# Patient Record
Sex: Female | Born: 1935 | Race: White | Hispanic: No | Smoking: Never smoker
Health system: Southern US, Community
[De-identification: ages and names within clinical notes are randomized; demographics above are authoritative.]

## PROBLEM LIST (undated history)

## (undated) DIAGNOSIS — E785 Hyperlipidemia, unspecified: Secondary | ICD-10-CM

## (undated) DIAGNOSIS — R011 Cardiac murmur, unspecified: Secondary | ICD-10-CM

## (undated) DIAGNOSIS — M2550 Pain in unspecified joint: Secondary | ICD-10-CM

## (undated) DIAGNOSIS — K432 Incisional hernia without obstruction or gangrene: Secondary | ICD-10-CM

## (undated) DIAGNOSIS — K5792 Diverticulitis of intestine, part unspecified, without perforation or abscess without bleeding: Secondary | ICD-10-CM

## (undated) DIAGNOSIS — F0390 Unspecified dementia without behavioral disturbance: Secondary | ICD-10-CM

## (undated) DIAGNOSIS — Z8719 Personal history of other diseases of the digestive system: Secondary | ICD-10-CM

## (undated) DIAGNOSIS — H9190 Unspecified hearing loss, unspecified ear: Secondary | ICD-10-CM

## (undated) DIAGNOSIS — E876 Hypokalemia: Secondary | ICD-10-CM

## (undated) DIAGNOSIS — I10 Essential (primary) hypertension: Secondary | ICD-10-CM

## (undated) DIAGNOSIS — M542 Cervicalgia: Secondary | ICD-10-CM

## (undated) DIAGNOSIS — Z87442 Personal history of urinary calculi: Secondary | ICD-10-CM

## (undated) DIAGNOSIS — IMO0001 Reserved for inherently not codable concepts without codable children: Secondary | ICD-10-CM

## (undated) DIAGNOSIS — R531 Weakness: Secondary | ICD-10-CM

## (undated) DIAGNOSIS — Z8619 Personal history of other infectious and parasitic diseases: Secondary | ICD-10-CM

## (undated) DIAGNOSIS — R112 Nausea with vomiting, unspecified: Secondary | ICD-10-CM

## (undated) DIAGNOSIS — K219 Gastro-esophageal reflux disease without esophagitis: Secondary | ICD-10-CM

## (undated) DIAGNOSIS — R51 Headache: Secondary | ICD-10-CM

## (undated) DIAGNOSIS — Z9889 Other specified postprocedural states: Secondary | ICD-10-CM

## (undated) DIAGNOSIS — E039 Hypothyroidism, unspecified: Secondary | ICD-10-CM

## (undated) DIAGNOSIS — M2540 Effusion, unspecified joint: Secondary | ICD-10-CM

## (undated) DIAGNOSIS — R480 Dyslexia and alexia: Secondary | ICD-10-CM

## (undated) DIAGNOSIS — M1990 Unspecified osteoarthritis, unspecified site: Secondary | ICD-10-CM

## (undated) DIAGNOSIS — R55 Syncope and collapse: Secondary | ICD-10-CM

## (undated) DIAGNOSIS — C50919 Malignant neoplasm of unspecified site of unspecified female breast: Secondary | ICD-10-CM

## (undated) HISTORY — DX: Syncope and collapse: R55

## (undated) HISTORY — PX: SHOULDER SURGERY: SHX246

## (undated) HISTORY — PX: OTHER SURGICAL HISTORY: SHX169

## (undated) HISTORY — PX: THYROIDECTOMY: SHX17

## (undated) HISTORY — DX: Personal history of other diseases of the digestive system: Z87.19

## (undated) HISTORY — PX: BREAST LUMPECTOMY: SHX2

## (undated) HISTORY — DX: Malignant neoplasm of unspecified site of unspecified female breast: C50.919

## (undated) HISTORY — PX: TONSILLECTOMY: SUR1361

## (undated) HISTORY — DX: Unspecified osteoarthritis, unspecified site: M19.90

## (undated) HISTORY — DX: Hypokalemia: E87.6

## (undated) HISTORY — PX: TUBAL LIGATION: SHX77

## (undated) HISTORY — PX: KNEE SURGERY: SHX244

## (undated) HISTORY — PX: CARDIAC CATHETERIZATION: SHX172

## (undated) HISTORY — DX: Diverticulitis of intestine, part unspecified, without perforation or abscess without bleeding: K57.92

## (undated) HISTORY — DX: Essential (primary) hypertension: I10

## (undated) HISTORY — PX: CATARACT EXTRACTION: SUR2

## (undated) HISTORY — PX: APPENDECTOMY: SHX54

## (undated) HISTORY — PX: MANDIBLE RECONSTRUCTION: SHX431

---

## 1990-09-04 HISTORY — PX: KIDNEY STONE SURGERY: SHX686

## 1995-09-05 HISTORY — PX: LAPAROSCOPIC NISSEN FUNDOPLICATION: SHX1932

## 1995-09-05 HISTORY — PX: CHOLECYSTECTOMY: SHX55

## 1998-09-04 HISTORY — PX: NECK SURGERY: SHX720

## 1999-07-30 ENCOUNTER — Inpatient Hospital Stay (HOSPITAL_COMMUNITY): Admission: EM | Admit: 1999-07-30 | Discharge: 1999-07-31 | Payer: Self-pay | Admitting: Cardiology

## 2000-07-02 ENCOUNTER — Other Ambulatory Visit: Admission: RE | Admit: 2000-07-02 | Discharge: 2000-07-02 | Payer: Self-pay | Admitting: Obstetrics and Gynecology

## 2001-03-05 ENCOUNTER — Ambulatory Visit (HOSPITAL_COMMUNITY): Admission: RE | Admit: 2001-03-05 | Discharge: 2001-03-05 | Payer: Self-pay | Admitting: Ophthalmology

## 2001-04-20 ENCOUNTER — Emergency Department (HOSPITAL_COMMUNITY): Admission: EM | Admit: 2001-04-20 | Discharge: 2001-04-20 | Payer: Self-pay | Admitting: Emergency Medicine

## 2001-06-07 ENCOUNTER — Emergency Department (HOSPITAL_COMMUNITY): Admission: EM | Admit: 2001-06-07 | Discharge: 2001-06-07 | Payer: Self-pay | Admitting: Emergency Medicine

## 2001-09-04 DIAGNOSIS — C50919 Malignant neoplasm of unspecified site of unspecified female breast: Secondary | ICD-10-CM

## 2001-09-04 HISTORY — DX: Malignant neoplasm of unspecified site of unspecified female breast: C50.919

## 2001-12-09 ENCOUNTER — Encounter: Payer: Self-pay | Admitting: Emergency Medicine

## 2001-12-09 ENCOUNTER — Inpatient Hospital Stay (HOSPITAL_COMMUNITY): Admission: EM | Admit: 2001-12-09 | Discharge: 2001-12-12 | Payer: Self-pay | Admitting: Cardiology

## 2002-02-26 ENCOUNTER — Ambulatory Visit (HOSPITAL_COMMUNITY): Admission: RE | Admit: 2002-02-26 | Discharge: 2002-02-26 | Payer: Self-pay | Admitting: General Surgery

## 2002-03-12 ENCOUNTER — Ambulatory Visit (HOSPITAL_COMMUNITY): Admission: RE | Admit: 2002-03-12 | Discharge: 2002-03-12 | Payer: Self-pay | Admitting: General Surgery

## 2002-05-12 ENCOUNTER — Encounter: Admission: RE | Admit: 2002-05-12 | Discharge: 2002-05-12 | Payer: Self-pay | Admitting: Oncology

## 2002-05-12 ENCOUNTER — Encounter (HOSPITAL_COMMUNITY): Admission: RE | Admit: 2002-05-12 | Discharge: 2002-06-11 | Payer: Self-pay | Admitting: Oncology

## 2002-05-19 ENCOUNTER — Encounter (HOSPITAL_COMMUNITY): Payer: Self-pay | Admitting: Oncology

## 2002-05-19 ENCOUNTER — Ambulatory Visit (HOSPITAL_COMMUNITY): Admission: RE | Admit: 2002-05-19 | Discharge: 2002-05-19 | Payer: Self-pay | Admitting: Oncology

## 2002-05-29 ENCOUNTER — Ambulatory Visit: Admission: RE | Admit: 2002-05-29 | Discharge: 2002-07-22 | Payer: Self-pay | Admitting: Radiation Oncology

## 2002-08-11 ENCOUNTER — Encounter (HOSPITAL_COMMUNITY): Admission: RE | Admit: 2002-08-11 | Discharge: 2002-09-10 | Payer: Self-pay | Admitting: Oncology

## 2002-08-11 ENCOUNTER — Encounter: Admission: RE | Admit: 2002-08-11 | Discharge: 2002-08-11 | Payer: Self-pay | Admitting: Oncology

## 2002-12-15 ENCOUNTER — Emergency Department (HOSPITAL_COMMUNITY): Admission: EM | Admit: 2002-12-15 | Discharge: 2002-12-15 | Payer: Self-pay | Admitting: Emergency Medicine

## 2002-12-15 ENCOUNTER — Encounter: Payer: Self-pay | Admitting: Emergency Medicine

## 2003-02-06 ENCOUNTER — Encounter: Admission: RE | Admit: 2003-02-06 | Discharge: 2003-02-06 | Payer: Self-pay | Admitting: Oncology

## 2003-02-06 ENCOUNTER — Encounter (HOSPITAL_COMMUNITY): Admission: RE | Admit: 2003-02-06 | Discharge: 2003-03-08 | Payer: Self-pay | Admitting: Oncology

## 2003-03-16 ENCOUNTER — Ambulatory Visit (HOSPITAL_COMMUNITY): Admission: RE | Admit: 2003-03-16 | Discharge: 2003-03-16 | Payer: Self-pay | Admitting: Internal Medicine

## 2003-03-26 ENCOUNTER — Emergency Department (HOSPITAL_COMMUNITY): Admission: EM | Admit: 2003-03-26 | Discharge: 2003-03-26 | Payer: Self-pay | Admitting: Emergency Medicine

## 2003-03-26 ENCOUNTER — Encounter: Payer: Self-pay | Admitting: Emergency Medicine

## 2003-04-06 ENCOUNTER — Ambulatory Visit (HOSPITAL_COMMUNITY): Admission: RE | Admit: 2003-04-06 | Discharge: 2003-04-06 | Payer: Self-pay | Admitting: Internal Medicine

## 2003-04-06 ENCOUNTER — Encounter (INDEPENDENT_AMBULATORY_CARE_PROVIDER_SITE_OTHER): Payer: Self-pay | Admitting: Internal Medicine

## 2003-04-13 ENCOUNTER — Encounter: Admission: RE | Admit: 2003-04-13 | Discharge: 2003-04-13 | Payer: Self-pay | Admitting: Oncology

## 2003-04-13 ENCOUNTER — Encounter (HOSPITAL_COMMUNITY): Admission: RE | Admit: 2003-04-13 | Discharge: 2003-05-13 | Payer: Self-pay | Admitting: Oncology

## 2003-04-13 ENCOUNTER — Encounter (HOSPITAL_COMMUNITY): Payer: Self-pay | Admitting: Oncology

## 2003-06-01 ENCOUNTER — Ambulatory Visit (HOSPITAL_COMMUNITY): Admission: RE | Admit: 2003-06-01 | Discharge: 2003-06-01 | Payer: Self-pay | Admitting: Pulmonary Disease

## 2003-08-10 ENCOUNTER — Encounter (HOSPITAL_COMMUNITY): Admission: RE | Admit: 2003-08-10 | Discharge: 2003-09-09 | Payer: Self-pay | Admitting: Oncology

## 2003-08-10 ENCOUNTER — Encounter: Admission: RE | Admit: 2003-08-10 | Discharge: 2003-08-10 | Payer: Self-pay | Admitting: Oncology

## 2004-02-15 ENCOUNTER — Encounter (HOSPITAL_COMMUNITY): Admission: RE | Admit: 2004-02-15 | Discharge: 2004-03-16 | Payer: Self-pay | Admitting: Oncology

## 2004-02-15 ENCOUNTER — Encounter: Admission: RE | Admit: 2004-02-15 | Discharge: 2004-02-15 | Payer: Self-pay | Admitting: Oncology

## 2004-05-04 ENCOUNTER — Encounter (HOSPITAL_COMMUNITY): Admission: RE | Admit: 2004-05-04 | Discharge: 2004-06-03 | Payer: Self-pay | Admitting: Oncology

## 2004-05-04 ENCOUNTER — Encounter: Admission: RE | Admit: 2004-05-04 | Discharge: 2004-06-03 | Payer: Self-pay | Admitting: Oncology

## 2004-06-08 ENCOUNTER — Encounter: Admission: RE | Admit: 2004-06-08 | Discharge: 2004-06-08 | Payer: Self-pay | Admitting: Oncology

## 2004-07-05 ENCOUNTER — Other Ambulatory Visit: Admission: RE | Admit: 2004-07-05 | Discharge: 2004-07-05 | Payer: Self-pay | Admitting: General Surgery

## 2004-08-04 ENCOUNTER — Ambulatory Visit (HOSPITAL_COMMUNITY): Admission: RE | Admit: 2004-08-04 | Discharge: 2004-08-04 | Payer: Self-pay | Admitting: Oncology

## 2004-08-12 ENCOUNTER — Encounter: Admission: RE | Admit: 2004-08-12 | Discharge: 2004-09-02 | Payer: Self-pay | Admitting: Oncology

## 2004-08-12 ENCOUNTER — Encounter (HOSPITAL_COMMUNITY): Admission: RE | Admit: 2004-08-12 | Discharge: 2004-09-02 | Payer: Self-pay | Admitting: Oncology

## 2004-08-12 ENCOUNTER — Ambulatory Visit (HOSPITAL_COMMUNITY): Payer: Self-pay | Admitting: Oncology

## 2004-08-20 ENCOUNTER — Emergency Department (HOSPITAL_COMMUNITY): Admission: EM | Admit: 2004-08-20 | Discharge: 2004-08-20 | Payer: Self-pay | Admitting: Emergency Medicine

## 2004-08-20 ENCOUNTER — Ambulatory Visit: Payer: Self-pay | Admitting: Internal Medicine

## 2004-09-09 ENCOUNTER — Ambulatory Visit (HOSPITAL_COMMUNITY): Admission: RE | Admit: 2004-09-09 | Discharge: 2004-09-09 | Payer: Self-pay | Admitting: Obstetrics & Gynecology

## 2004-09-21 ENCOUNTER — Ambulatory Visit: Payer: Self-pay | Admitting: Internal Medicine

## 2004-09-28 ENCOUNTER — Encounter: Admission: RE | Admit: 2004-09-28 | Discharge: 2004-09-28 | Payer: Self-pay | Admitting: Specialist

## 2004-09-30 ENCOUNTER — Ambulatory Visit (HOSPITAL_COMMUNITY): Admission: RE | Admit: 2004-09-30 | Discharge: 2004-09-30 | Payer: Self-pay | Admitting: Internal Medicine

## 2004-10-31 ENCOUNTER — Emergency Department (HOSPITAL_COMMUNITY): Admission: EM | Admit: 2004-10-31 | Discharge: 2004-10-31 | Payer: Self-pay | Admitting: Emergency Medicine

## 2005-01-06 ENCOUNTER — Inpatient Hospital Stay (HOSPITAL_COMMUNITY): Admission: RE | Admit: 2005-01-06 | Discharge: 2005-01-07 | Payer: Self-pay | Admitting: Specialist

## 2005-02-02 ENCOUNTER — Encounter: Admission: RE | Admit: 2005-02-02 | Discharge: 2005-02-02 | Payer: Self-pay | Admitting: Oncology

## 2005-02-02 ENCOUNTER — Ambulatory Visit (HOSPITAL_COMMUNITY): Payer: Self-pay | Admitting: Oncology

## 2005-02-02 ENCOUNTER — Encounter (HOSPITAL_COMMUNITY): Admission: RE | Admit: 2005-02-02 | Discharge: 2005-03-04 | Payer: Self-pay | Admitting: Oncology

## 2005-03-03 ENCOUNTER — Encounter: Admission: RE | Admit: 2005-03-03 | Discharge: 2005-03-03 | Payer: Self-pay | Admitting: Specialist

## 2005-03-14 ENCOUNTER — Encounter: Admission: RE | Admit: 2005-03-14 | Discharge: 2005-03-14 | Payer: Self-pay | Admitting: Specialist

## 2005-03-15 ENCOUNTER — Encounter (HOSPITAL_COMMUNITY): Admission: RE | Admit: 2005-03-15 | Discharge: 2005-04-14 | Payer: Self-pay | Admitting: Oncology

## 2005-03-15 ENCOUNTER — Encounter: Admission: RE | Admit: 2005-03-15 | Discharge: 2005-03-15 | Payer: Self-pay | Admitting: Oncology

## 2005-03-22 ENCOUNTER — Ambulatory Visit (HOSPITAL_COMMUNITY): Payer: Self-pay | Admitting: Oncology

## 2005-04-11 ENCOUNTER — Emergency Department (HOSPITAL_COMMUNITY): Admission: EM | Admit: 2005-04-11 | Discharge: 2005-04-11 | Payer: Self-pay | Admitting: Emergency Medicine

## 2005-05-15 ENCOUNTER — Emergency Department (HOSPITAL_COMMUNITY): Admission: EM | Admit: 2005-05-15 | Discharge: 2005-05-15 | Payer: Self-pay | Admitting: Emergency Medicine

## 2005-05-31 ENCOUNTER — Encounter (HOSPITAL_COMMUNITY): Admission: RE | Admit: 2005-05-31 | Discharge: 2005-06-02 | Payer: Self-pay | Admitting: Oncology

## 2005-05-31 ENCOUNTER — Encounter: Admission: RE | Admit: 2005-05-31 | Discharge: 2005-06-02 | Payer: Self-pay | Admitting: Oncology

## 2005-08-18 ENCOUNTER — Emergency Department (HOSPITAL_COMMUNITY): Admission: EM | Admit: 2005-08-18 | Discharge: 2005-08-18 | Payer: Self-pay | Admitting: Emergency Medicine

## 2005-09-18 ENCOUNTER — Encounter (HOSPITAL_COMMUNITY): Admission: RE | Admit: 2005-09-18 | Discharge: 2005-10-18 | Payer: Self-pay | Admitting: Oncology

## 2005-09-18 ENCOUNTER — Encounter: Admission: RE | Admit: 2005-09-18 | Discharge: 2005-09-18 | Payer: Self-pay | Admitting: Oncology

## 2005-09-18 ENCOUNTER — Ambulatory Visit (HOSPITAL_COMMUNITY): Payer: Self-pay | Admitting: Oncology

## 2005-10-04 ENCOUNTER — Encounter: Admission: RE | Admit: 2005-10-04 | Discharge: 2005-10-04 | Payer: Self-pay | Admitting: Orthopedic Surgery

## 2006-03-19 ENCOUNTER — Encounter: Admission: RE | Admit: 2006-03-19 | Discharge: 2006-03-19 | Payer: Self-pay | Admitting: Oncology

## 2006-03-19 ENCOUNTER — Ambulatory Visit (HOSPITAL_COMMUNITY): Payer: Self-pay | Admitting: Oncology

## 2006-03-19 ENCOUNTER — Encounter (HOSPITAL_COMMUNITY): Admission: RE | Admit: 2006-03-19 | Discharge: 2006-04-18 | Payer: Self-pay | Admitting: Oncology

## 2006-06-04 ENCOUNTER — Encounter: Admission: RE | Admit: 2006-06-04 | Discharge: 2006-06-04 | Payer: Self-pay | Admitting: Oncology

## 2006-06-04 ENCOUNTER — Encounter (HOSPITAL_COMMUNITY): Admission: RE | Admit: 2006-06-04 | Discharge: 2006-07-04 | Payer: Self-pay | Admitting: Oncology

## 2006-09-05 ENCOUNTER — Encounter (HOSPITAL_COMMUNITY): Admission: RE | Admit: 2006-09-05 | Discharge: 2006-10-05 | Payer: Self-pay | Admitting: Oncology

## 2006-09-05 ENCOUNTER — Ambulatory Visit (HOSPITAL_COMMUNITY): Payer: Self-pay | Admitting: Oncology

## 2006-09-10 ENCOUNTER — Ambulatory Visit (HOSPITAL_COMMUNITY): Admission: RE | Admit: 2006-09-10 | Discharge: 2006-09-10 | Payer: Self-pay | Admitting: Oncology

## 2006-11-27 ENCOUNTER — Ambulatory Visit (HOSPITAL_COMMUNITY): Admission: RE | Admit: 2006-11-27 | Discharge: 2006-11-27 | Payer: Self-pay | Admitting: Urology

## 2007-03-11 ENCOUNTER — Encounter (HOSPITAL_COMMUNITY): Admission: RE | Admit: 2007-03-11 | Discharge: 2007-04-10 | Payer: Self-pay | Admitting: Oncology

## 2007-03-11 ENCOUNTER — Ambulatory Visit (HOSPITAL_COMMUNITY): Payer: Self-pay | Admitting: Oncology

## 2007-06-26 ENCOUNTER — Encounter (HOSPITAL_COMMUNITY): Admission: RE | Admit: 2007-06-26 | Discharge: 2007-07-26 | Payer: Self-pay | Admitting: Oncology

## 2007-09-05 HISTORY — PX: WRIST SURGERY: SHX841

## 2007-09-06 ENCOUNTER — Encounter (HOSPITAL_COMMUNITY): Admission: RE | Admit: 2007-09-06 | Discharge: 2007-10-06 | Payer: Self-pay | Admitting: Oncology

## 2007-09-06 ENCOUNTER — Ambulatory Visit (HOSPITAL_COMMUNITY): Payer: Self-pay | Admitting: Oncology

## 2007-10-03 ENCOUNTER — Ambulatory Visit (HOSPITAL_COMMUNITY): Admission: RE | Admit: 2007-10-03 | Discharge: 2007-10-03 | Payer: Self-pay | Admitting: Pulmonary Disease

## 2007-11-28 ENCOUNTER — Observation Stay (HOSPITAL_COMMUNITY): Admission: EM | Admit: 2007-11-28 | Discharge: 2007-11-29 | Payer: Self-pay | Admitting: Emergency Medicine

## 2007-11-28 ENCOUNTER — Ambulatory Visit: Payer: Self-pay | Admitting: Cardiology

## 2007-11-29 ENCOUNTER — Ambulatory Visit: Payer: Self-pay

## 2007-12-04 ENCOUNTER — Ambulatory Visit: Payer: Self-pay | Admitting: Cardiology

## 2008-03-09 ENCOUNTER — Encounter (HOSPITAL_COMMUNITY): Admission: RE | Admit: 2008-03-09 | Discharge: 2008-04-08 | Payer: Self-pay | Admitting: Oncology

## 2008-03-09 ENCOUNTER — Ambulatory Visit (HOSPITAL_COMMUNITY): Payer: Self-pay | Admitting: Oncology

## 2008-06-29 ENCOUNTER — Encounter (HOSPITAL_COMMUNITY): Admission: RE | Admit: 2008-06-29 | Discharge: 2008-07-29 | Payer: Self-pay | Admitting: Oncology

## 2008-09-07 ENCOUNTER — Encounter (HOSPITAL_COMMUNITY): Admission: RE | Admit: 2008-09-07 | Discharge: 2008-10-07 | Payer: Self-pay | Admitting: Oncology

## 2008-09-09 ENCOUNTER — Ambulatory Visit (HOSPITAL_COMMUNITY): Payer: Self-pay | Admitting: Oncology

## 2008-11-17 ENCOUNTER — Ambulatory Visit (HOSPITAL_COMMUNITY): Admission: RE | Admit: 2008-11-17 | Discharge: 2008-11-17 | Payer: Self-pay | Admitting: Pulmonary Disease

## 2009-03-10 ENCOUNTER — Ambulatory Visit (HOSPITAL_COMMUNITY): Payer: Self-pay | Admitting: Oncology

## 2009-03-10 ENCOUNTER — Encounter (HOSPITAL_COMMUNITY): Admission: RE | Admit: 2009-03-10 | Discharge: 2009-04-09 | Payer: Self-pay | Admitting: Oncology

## 2009-03-16 ENCOUNTER — Ambulatory Visit (HOSPITAL_COMMUNITY): Admission: RE | Admit: 2009-03-16 | Discharge: 2009-03-16 | Payer: Self-pay | Admitting: Oncology

## 2009-03-22 ENCOUNTER — Emergency Department (HOSPITAL_COMMUNITY): Admission: EM | Admit: 2009-03-22 | Discharge: 2009-03-22 | Payer: Self-pay | Admitting: Emergency Medicine

## 2009-07-01 ENCOUNTER — Ambulatory Visit (HOSPITAL_COMMUNITY): Admission: RE | Admit: 2009-07-01 | Discharge: 2009-07-01 | Payer: Self-pay | Admitting: Oncology

## 2009-09-08 ENCOUNTER — Ambulatory Visit (HOSPITAL_COMMUNITY): Payer: Self-pay | Admitting: Oncology

## 2009-09-08 ENCOUNTER — Encounter (HOSPITAL_COMMUNITY): Admission: RE | Admit: 2009-09-08 | Discharge: 2009-10-08 | Payer: Self-pay | Admitting: Oncology

## 2009-11-21 ENCOUNTER — Inpatient Hospital Stay (HOSPITAL_COMMUNITY): Admission: EM | Admit: 2009-11-21 | Discharge: 2009-11-25 | Payer: Self-pay | Admitting: Emergency Medicine

## 2009-12-27 ENCOUNTER — Emergency Department (HOSPITAL_COMMUNITY): Admission: EM | Admit: 2009-12-27 | Discharge: 2009-12-27 | Payer: Self-pay | Admitting: Emergency Medicine

## 2010-03-04 ENCOUNTER — Encounter (HOSPITAL_COMMUNITY): Admission: RE | Admit: 2010-03-04 | Discharge: 2010-04-03 | Payer: Self-pay | Admitting: Oncology

## 2010-03-04 ENCOUNTER — Ambulatory Visit (HOSPITAL_COMMUNITY): Payer: Self-pay | Admitting: Oncology

## 2010-06-01 ENCOUNTER — Observation Stay (HOSPITAL_COMMUNITY): Admission: EM | Admit: 2010-06-01 | Discharge: 2010-06-03 | Payer: Self-pay | Admitting: Emergency Medicine

## 2010-06-01 ENCOUNTER — Ambulatory Visit: Payer: Self-pay | Admitting: Cardiology

## 2010-06-02 ENCOUNTER — Encounter (INDEPENDENT_AMBULATORY_CARE_PROVIDER_SITE_OTHER): Payer: Self-pay | Admitting: Pulmonary Disease

## 2010-07-07 ENCOUNTER — Ambulatory Visit (HOSPITAL_COMMUNITY): Admission: RE | Admit: 2010-07-07 | Discharge: 2010-07-07 | Payer: Self-pay | Admitting: Oncology

## 2010-09-04 HISTORY — PX: COLONOSCOPY: SHX174

## 2010-09-08 ENCOUNTER — Encounter (HOSPITAL_COMMUNITY)
Admission: RE | Admit: 2010-09-08 | Discharge: 2010-10-04 | Payer: Self-pay | Source: Home / Self Care | Attending: Oncology | Admitting: Oncology

## 2010-09-08 ENCOUNTER — Ambulatory Visit (HOSPITAL_COMMUNITY)
Admission: RE | Admit: 2010-09-08 | Discharge: 2010-10-04 | Payer: Self-pay | Source: Home / Self Care | Attending: Oncology | Admitting: Oncology

## 2010-09-08 LAB — COMPREHENSIVE METABOLIC PANEL
ALT: 17 U/L (ref 0–35)
AST: 21 U/L (ref 0–37)
Albumin: 3.7 g/dL (ref 3.5–5.2)
Alkaline Phosphatase: 46 U/L (ref 39–117)
BUN: 20 mg/dL (ref 6–23)
CO2: 25 mEq/L (ref 19–32)
Calcium: 9.9 mg/dL (ref 8.4–10.5)
Chloride: 107 mEq/L (ref 96–112)
Creatinine, Ser: 0.8 mg/dL (ref 0.4–1.2)
GFR calc Af Amer: >60 mL/min (ref >60)
GFR calc non Af Amer: >60 mL/min (ref >60)
Glucose, Bld: 86 mg/dL (ref 70–99)
Potassium: 4 mEq/L (ref 3.5–5.1)
Sodium: 139 mEq/L (ref 135–145)
Total Bilirubin: 0.5 mg/dL (ref 0.3–1.2)
Total Protein: 6.8 g/dL (ref 6.0–8.3)

## 2010-09-24 ENCOUNTER — Encounter (HOSPITAL_COMMUNITY): Payer: Self-pay | Admitting: Oncology

## 2010-09-25 ENCOUNTER — Encounter: Payer: Self-pay | Admitting: Orthopedic Surgery

## 2010-11-07 ENCOUNTER — Ambulatory Visit (INDEPENDENT_AMBULATORY_CARE_PROVIDER_SITE_OTHER): Payer: Medicare Other | Admitting: Urgent Care

## 2010-11-07 ENCOUNTER — Encounter: Payer: Self-pay | Admitting: Internal Medicine

## 2010-11-07 ENCOUNTER — Encounter: Payer: Self-pay | Admitting: Urgent Care

## 2010-11-07 DIAGNOSIS — R159 Full incontinence of feces: Secondary | ICD-10-CM

## 2010-11-07 DIAGNOSIS — R197 Diarrhea, unspecified: Secondary | ICD-10-CM | POA: Insufficient documentation

## 2010-11-08 ENCOUNTER — Encounter: Payer: Self-pay | Admitting: Urgent Care

## 2010-11-14 ENCOUNTER — Encounter: Payer: Medicare Other | Admitting: Internal Medicine

## 2010-11-14 ENCOUNTER — Ambulatory Visit (HOSPITAL_COMMUNITY)
Admission: RE | Admit: 2010-11-14 | Discharge: 2010-11-14 | Disposition: A | Discharge status: Home / Self Care | Payer: Medicare Other | Source: Ambulatory Visit | Attending: Internal Medicine | Admitting: Internal Medicine

## 2010-11-14 ENCOUNTER — Other Ambulatory Visit: Payer: Self-pay | Admitting: Internal Medicine

## 2010-11-14 DIAGNOSIS — I10 Essential (primary) hypertension: Secondary | ICD-10-CM | POA: Insufficient documentation

## 2010-11-14 DIAGNOSIS — Z79899 Other long term (current) drug therapy: Secondary | ICD-10-CM | POA: Insufficient documentation

## 2010-11-14 DIAGNOSIS — R197 Diarrhea, unspecified: Secondary | ICD-10-CM | POA: Insufficient documentation

## 2010-11-14 DIAGNOSIS — K5730 Diverticulosis of large intestine without perforation or abscess without bleeding: Secondary | ICD-10-CM

## 2010-11-14 DIAGNOSIS — D126 Benign neoplasm of colon, unspecified: Secondary | ICD-10-CM

## 2010-11-15 LAB — FECAL LACTOFERRIN, QUANT: Fecal Lactoferrin: POSITIVE

## 2010-11-15 NOTE — Letter (Signed)
Summary: REFERRAL FROM DR Juanetta Gosling  REFERRAL FROM DR HAWKINS   Imported By: Rexene Alberts 11/08/2010 16:24:56  _____________________________________________________________________  External Attachment:    Type:   Image     Comment:   External Document

## 2010-11-15 NOTE — Assessment & Plan Note (Signed)
Summary: UNCONTROLABLE CHANGE IN BOWEL HABITS   Vital Signs:  Patient profile:   76 year old female Height:      66.5 inches Weight:      205 pounds BMI:     32.71 Temp:     98.7 degrees F oral Pulse rate:   76 / minute BP sitting:   148 / 84  (right arm)  Vitals Entered By: Carolan Clines LPN (November 06, 452 2:11 PM)  Visit Type:  Initial Consult Primary Care Provider:  Dr Juanetta Gosling  Chief Complaint:  diarrhea.  History of Present Illness: 76 y/o caucasian female w/ Hx IBS, worsening diarrhea since cholecystectomy.  c/o fecal incontinence & severe urgency.  Denies rectal bleeding or melena.  Very watery loose stools every day 2-3 per day.  At times "feel  like I have to use my finger to get it out when I have ribbon-like stool"  & other times yellow & orange explosive stool.  c/o bilat lower abd pain 10/10 & relieved w/ defecation.  Was admitted [facility] w/ diverticulitis & treated w/ abx 1 yr ago.  c/o knot below rectum.  Denies nausea or vomiting.  Takes nexium 40mg  daily w/ well-controlled reflux.  Appetite ok.  Wt stable. No B/P L arm. previous lymphectomy L side  Current Medications (verified): 1)  Synthroid 100 Mcg Tabs (Levothyroxine Sodium) .... Take One Once Daily 2)  Amlodipine Besy-Benazepril Hcl 5-20 Mg Caps (Amlodipine Besy-Benazepril Hcl) .... Take One Once Daily 3)  Nexium 40 Mg Cpdr (Esomeprazole Magnesium) .... Take One Once Daily 4)  Potassium Chloride 20 Meq Pack (Potassium Chloride) .... Take One Once Daily  Allergies (verified): 1)  ! Cipro 2)  ! * Oxycodone  Past History:  Past Medical History: 1.  HxChest pain, myocardial infarction ruled out.   2. Nonobstructive coronary artery occlusive disease by cardiac       catheterization in 2003.   3. History of hiatal hernia.   4. History of esophageal spasm.   5. Irritable bowel syndrome.   6. Breast cancer. 2003  7. Noncardiac syncope.   8. Osteoarthritis.   9. History of diverticulitis.   10.Hypertension.    11.Hypokalemia.   Past Surgical History: Hypothyroidism status post thyroidectomy.  Status post cholecystectomy 1997-stones lap NISSEN 1997 left arm fx left breast lumpectomy eye 2002 knee jaw reconstruction kidney stone 1992  Family History: Father: (deceased 65s) massive MI Mother: (deceased 43's) pancreatic ca Siblings: 2 brothers/2 sisters-living lost 1 brother-homicide  Social History: married x 59yrs  4 grown healthy children retired bookkeeper Patient has never smoked.  Alcohol Use - no Illicit Drug Use - no Smoking Status:  never Drug Use:  no  Review of Systems General:  Denies fever, chills, sweats, anorexia, fatigue, weakness, and malaise. CV:  Denies chest pains, angina, palpitations, syncope, dyspnea on exertion, orthopnea, PND, peripheral edema, and claudication. Resp:  Complains of cough; denies dyspnea at rest, dyspnea with exercise, sputum, wheezing, coughing up blood, and pleurisy. GI:  See HPI; Denies difficulty swallowing, pain on swallowing, jaundice, bloody BM's, and black BMs. GU:  Denies urinary burning, blood in urine, nocturnal urination, urinary frequency, urinary incontinence, and abnormal vaginal bleeding. MS:  Denies joint pain / LOM, joint swelling, joint stiffness, joint deformity, low back pain, muscle weakness, muscle cramps, muscle atrophy, leg pain at night, leg pain with exertion, and shoulder pain / LOM hand / wrist pain (CTS). Derm:  Denies rash, itching, dry skin, hives, moles, warts, and unhealing ulcers. Psych:  Denies depression, anxiety, memory loss, suicidal ideation, hallucinations, paranoia, phobia, and confusion. Heme:  Denies bruising, bleeding, and enlarged lymph nodes.  Physical Exam  General:  Well developed, well nourished, no acute distress. Head:  Normocephalic and atraumatic. Eyes:  Sclera clear, no icterus. Ears:  Normal auditory acuity. Nose:  No deformity, discharge,  or lesions. Mouth:  No deformity or  lesions, dentition normal. Neck:  Supple; no masses or thyromegaly. Lungs:  Clear throughout to auscultation. Heart:  Regular rate and rhythm; no murmurs, rubs,  or bruits. Abdomen:  Obese, Soft, nontender and nondistended. No masses, hepatosplenomegaly or hernias noted. Normal bowel sounds.  Exam limited given pt's body habitus. Rectal:  deferred until time of colonoscopy.   Msk:  Symmetrical with no gross deformities. Normal posture. Pulses:  Normal pulses noted. Extremities:  No clubbing, cyanosis, edema or deformities noted. Neurologic:  Alert and  oriented x4;  grossly normal neurologically. Skin:  Intact without significant lesions or rashes. Cervical Nodes:  No significant cervical adenopathy. Axillary Nodes:  No significant axillary adenopathy. Inguinal Nodes:  No significant inguinal adenopathy. Psych:  Alert and cooperative. Normal mood and affect.   Impression & Recommendations:  Problem # 1:  DIARRHEA (ICD-74.91) 76 y/o caucasian female w/ lifelong IBS, worse since cholecystectomy, and severe urgency & incontinence over past few weeks.  She will need colonoscopy for further evaluation t/o r/o colorectal ca, polyps, microscopic colitis.    Diagnostic colonoscopy to be performed by Dr. Suszanne Conners Rourk in the near future.  I have discussed risks and benefits which include, but are not limited to, bleeding, infection, perforation, or medication reaction.  The patient agrees with this plan and consent will be obtained.  Orders: Consultation Level III (60454)  Problem # 2:  FULL INCONTINENCE OF FECES (ICD-787.60) see #1   Orders Added: 1)  Consultation Level III [09811]

## 2010-11-15 NOTE — Letter (Signed)
Summary: TCS ORDER  TCS ORDER   Imported By: Ave Filter 11/07/2010 15:21:42  _____________________________________________________________________  External Attachment:    Type:   Image     Comment:   External Document

## 2010-11-17 LAB — POCT CARDIAC MARKERS
CKMB, poc: <1 ng/mL — ABNORMAL LOW (ref 1.0–8.0)
CKMB, poc: <1 ng/mL — ABNORMAL LOW (ref 1.0–8.0)
Myoglobin, poc: 116 ng/mL (ref 12–200)
Myoglobin, poc: 125 ng/mL (ref 12–200)
Troponin i, poc: <0.05 ng/mL (ref 0.00–0.09)
Troponin i, poc: <0.05 ng/mL (ref 0.00–0.09)

## 2010-11-17 LAB — DIFFERENTIAL
Basophils Absolute: 0 K/uL (ref 0.0–0.1)
Basophils Absolute: 0 K/uL (ref 0.0–0.1)
Basophils Absolute: 0.1 K/uL (ref 0.0–0.1)
Basophils Relative: 0 % (ref 0–1)
Basophils Relative: 1 % (ref 0–1)
Basophils Relative: 1 % (ref 0–1)
Eosinophils Absolute: 0.1 K/uL (ref 0.0–0.7)
Eosinophils Absolute: 0.1 K/uL (ref 0.0–0.7)
Eosinophils Absolute: 0.1 K/uL (ref 0.0–0.7)
Eosinophils Relative: 1 % (ref 0–5)
Eosinophils Relative: 2 % (ref 0–5)
Eosinophils Relative: 2 % (ref 0–5)
Lymphocytes Relative: 18 % (ref 12–46)
Lymphocytes Relative: 31 % (ref 12–46)
Lymphocytes Relative: 36 % (ref 12–46)
Lymphs Abs: 1.8 K/uL (ref 0.7–4.0)
Lymphs Abs: 1.9 K/uL (ref 0.7–4.0)
Lymphs Abs: 2.8 K/uL (ref 0.7–4.0)
Monocytes Absolute: 0.5 K/uL (ref 0.1–1.0)
Monocytes Absolute: 0.7 K/uL (ref 0.1–1.0)
Monocytes Absolute: 0.9 K/uL (ref 0.1–1.0)
Monocytes Relative: 9 % (ref 3–12)
Monocytes Relative: 9 % (ref 3–12)
Monocytes Relative: 9 % (ref 3–12)
Neutro Abs: 3.3 K/uL (ref 1.7–7.7)
Neutro Abs: 4 K/uL (ref 1.7–7.7)
Neutro Abs: 7.9 K/uL — ABNORMAL HIGH (ref 1.7–7.7)
Neutrophils Relative %: 53 % (ref 43–77)
Neutrophils Relative %: 57 % (ref 43–77)
Neutrophils Relative %: 72 % (ref 43–77)

## 2010-11-17 LAB — CBC
HCT: 34.6 % — ABNORMAL LOW (ref 36.0–46.0)
HCT: 36.6 % (ref 36.0–46.0)
HCT: 38.7 % (ref 36.0–46.0)
Hemoglobin: 11.7 g/dL — ABNORMAL LOW (ref 12.0–15.0)
Hemoglobin: 12.4 g/dL (ref 12.0–15.0)
Hemoglobin: 13.1 g/dL (ref 12.0–15.0)
MCH: 30.1 pg (ref 26.0–34.0)
MCH: 30.2 pg (ref 26.0–34.0)
MCHC: 33.8 g/dL (ref 30.0–36.0)
MCHC: 33.8 g/dL (ref 30.0–36.0)
MCV: 89.2 fL (ref 78.0–100.0)
MCV: 89.5 fL (ref 78.0–100.0)
MCV: 89.9 fL (ref 78.0–100.0)
Platelets: 218 K/uL (ref 150–400)
RBC: 3.87 MIL/uL (ref 3.87–5.11)
RBC: 4.1 MIL/uL (ref 3.87–5.11)
RDW: 13.4 % (ref 11.5–15.5)
RDW: 13.6 % (ref 11.5–15.5)
WBC: 10.9 10*3/uL — ABNORMAL HIGH (ref 4.0–10.5)
WBC: 5.7 K/uL (ref 4.0–10.5)
WBC: 7.6 10*3/uL (ref 4.0–10.5)

## 2010-11-17 LAB — CARDIAC PANEL(CRET KIN+CKTOT+MB+TROPI)
CK, MB: 1.5 ng/mL (ref 0.3–4.0)
CK, MB: 1.5 ng/mL (ref 0.3–4.0)
Relative Index: INVALID (ref 0.0–2.5)
Total CK: 48 U/L (ref 7–177)
Total CK: 55 U/L (ref 7–177)
Troponin I: 0.01 ng/mL (ref 0.00–0.06)

## 2010-11-17 LAB — BASIC METABOLIC PANEL WITH GFR
BUN: 15 mg/dL (ref 6–23)
BUN: 22 mg/dL (ref 6–23)
CO2: 22 meq/L (ref 19–32)
CO2: 25 meq/L (ref 19–32)
Calcium: 9.6 mg/dL (ref 8.4–10.5)
Calcium: 9.8 mg/dL (ref 8.4–10.5)
Chloride: 103 meq/L (ref 96–112)
Chloride: 109 meq/L (ref 96–112)
Creatinine, Ser: 0.7 mg/dL (ref 0.4–1.2)
Creatinine, Ser: 1.4 mg/dL — ABNORMAL HIGH (ref 0.4–1.2)
GFR calc non Af Amer: 37 mL/min — ABNORMAL LOW
GFR calc non Af Amer: >60 mL/min
Glucose, Bld: 104 mg/dL — ABNORMAL HIGH (ref 70–99)
Glucose, Bld: 82 mg/dL (ref 70–99)
Potassium: 3.3 meq/L — ABNORMAL LOW (ref 3.5–5.1)
Potassium: 4.3 meq/L (ref 3.5–5.1)
Sodium: 140 meq/L (ref 135–145)
Sodium: 141 meq/L (ref 135–145)

## 2010-11-17 LAB — LIPID PANEL
Cholesterol: 252 mg/dL — ABNORMAL HIGH (ref 0–200)
LDL Cholesterol: 177 mg/dL — ABNORMAL HIGH (ref 0–99)
Triglycerides: 109 mg/dL (ref <150)
VLDL: 22 mg/dL (ref 0–40)

## 2010-11-17 LAB — HEPARIN LEVEL (UNFRACTIONATED): Heparin Unfractionated: 0.33 IU/mL (ref 0.30–0.70)

## 2010-11-17 LAB — PROTIME-INR: INR: 0.97 (ref 0.00–1.49)

## 2010-11-17 LAB — D-DIMER, QUANTITATIVE

## 2010-11-17 LAB — APTT: aPTT: 25 s (ref 24–37)

## 2010-11-18 LAB — STOOL CULTURE

## 2010-11-19 LAB — COMPREHENSIVE METABOLIC PANEL
Albumin: 3.6 g/dL (ref 3.5–5.2)
Alkaline Phosphatase: 51 U/L (ref 39–117)
BUN: 15 mg/dL (ref 6–23)
CO2: 23 mEq/L (ref 19–32)
Chloride: 107 mEq/L (ref 96–112)
Creatinine, Ser: 0.59 mg/dL (ref 0.4–1.2)
GFR calc non Af Amer: >60 mL/min (ref >60)
Glucose, Bld: 91 mg/dL (ref 70–99)
Potassium: 3.7 mEq/L (ref 3.5–5.1)
Total Bilirubin: 0.4 mg/dL (ref 0.3–1.2)

## 2010-11-20 ENCOUNTER — Encounter: Payer: Self-pay | Admitting: Internal Medicine

## 2010-11-20 LAB — COMPREHENSIVE METABOLIC PANEL
ALT: 14 U/L (ref 0–35)
AST: 18 U/L (ref 0–37)
Albumin: 3.6 g/dL (ref 3.5–5.2)
CO2: 27 mEq/L (ref 19–32)
Chloride: 107 mEq/L (ref 96–112)
Creatinine, Ser: 0.64 mg/dL (ref 0.4–1.2)
GFR calc Af Amer: >60 mL/min (ref >60)
GFR calc non Af Amer: >60 mL/min (ref >60)
Potassium: 3.6 mEq/L (ref 3.5–5.1)
Sodium: 140 mEq/L (ref 135–145)
Total Bilirubin: 0.5 mg/dL (ref 0.3–1.2)

## 2010-11-20 LAB — DIFFERENTIAL
Basophils Absolute: 0 10*3/uL (ref 0.0–0.1)
Eosinophils Absolute: 0.1 10*3/uL (ref 0.0–0.7)
Eosinophils Relative: 2 % (ref 0–5)
Lymphocytes Relative: 29 % (ref 12–46)
Monocytes Absolute: 0.5 10*3/uL (ref 0.1–1.0)

## 2010-11-20 LAB — CBC
Hemoglobin: 12.3 g/dL (ref 12.0–15.0)
Platelets: 212 10*3/uL (ref 150–400)
RBC: 4.05 MIL/uL (ref 3.87–5.11)
WBC: 6.1 10*3/uL (ref 4.0–10.5)

## 2010-11-20 NOTE — Op Note (Signed)
  NAMEHANSIKA, [name]                ACCOUNT NO.:  [id number]  MEDICAL RECORD NO.:  [id number]           PATIENT TYPE:  O  LOCATION:  DAYP                          FACILITY:  [facility]  PHYSICIAN:  R. Roetta Sessions, M.D. DATE OF BIRTH:  Jul 17, 1935  DATE OF PROCEDURE:  11/14/2010 DATE OF DISCHARGE:                              OPERATIVE REPORT   PROCEDURE:  Colonoscopy with biopsy stool sampling.  INDICATIONS FOR PROCEDURE:  A 76 year old lady with chronic diarrhea. Colonoscopy is now being done.  Risks, benefits, limitations, alternatives, imponderables have been discussed, questions answered. Please see the documentation in the medical record.  PROCEDURE NOTE:  O2 saturation, blood pressure, pulse, respirations were monitored throughout the entire procedure.  CONSCIOUS SEDATION:  Versed 5 mg IV, Demerol 75 mg IV in divided doses.  INSTRUMENT:  Pentax video chip system.  FINDINGS:  Digital rectal exam revealed no abnormalities.  Endoscopic findings:  Prep was suboptimal, but doable.  Colon:  Colonic mucosa was surveyed from the rectosigmoid junction through the left transverse right colon to the appendiceal orifice, ileocecal valve/cecum.  These structures were well seen and photographed for the record.  From this level, scope was slowly and cautiously withdrawn.  All previously mentioned mucosal surfaces were again seen.  The patient was noted to have scattered pancolonic diverticula with diminutive polyp in the base of cecum which was cold biopsied and removed.  The remainder of colonic mucosa appeared normal.  Segmental biopsies of the ascending and sigmoid segments were all taken to rule out microscopic colitis.  Also stool samples obtained.  The scope was pulled down to the rectum where a thorough examination of rectal mucosa including retroflexed view of the anal verge demonstrated no abnormalities.  The patient tolerated procedure well.  Cecal withdrawal time 10  minutes.  IMPRESSION:  Normal rectum, pancolonic diverticula with diminutive cecal polyp status post cold biopsy removal.  Segmental biopsies taken.  Stool sample collected.  RECOMMENDATIONS: 1. Diverticulosis and polyp literature provided to [name]. 2. Follow up on path. 3. Polyp and stool studies and further recommendations to follow.     Jonathon Bellows, M.D.     RMR/MEDQ  D:  11/14/2010  T:  11/14/2010  Job:  [id number]  cc:   Ramon Dredge L. Juanetta Gosling, M.D. Fax: [phone]  Electronically Signed by Lorrin Goodell M.D. on 11/20/2010 09:12:14 AM

## 2010-11-22 LAB — URINALYSIS, ROUTINE W REFLEX MICROSCOPIC
Bilirubin Urine: NEGATIVE
Glucose, UA: NEGATIVE mg/dL
Ketones, ur: NEGATIVE mg/dL
Nitrite: NEGATIVE
Protein, ur: NEGATIVE mg/dL
pH: 5.5 (ref 5.0–8.0)

## 2010-11-22 LAB — D-DIMER, QUANTITATIVE: D-Dimer, Quant: 0.57 ug/mL-FEU — ABNORMAL HIGH (ref 0.00–0.48)

## 2010-11-22 LAB — DIFFERENTIAL
Basophils Relative: 1 % (ref 0–1)
Eosinophils Relative: 1 % (ref 0–5)
Lymphs Abs: 4.2 10*3/uL — ABNORMAL HIGH (ref 0.7–4.0)
Monocytes Relative: 9 % (ref 3–12)
Neutro Abs: 7.5 10*3/uL (ref 1.7–7.7)

## 2010-11-22 LAB — BASIC METABOLIC PANEL
Chloride: 105 mEq/L (ref 96–112)
GFR calc non Af Amer: 55 mL/min — ABNORMAL LOW (ref >60)
Potassium: 3.5 mEq/L (ref 3.5–5.1)
Sodium: 138 mEq/L (ref 135–145)

## 2010-11-22 LAB — CBC
HCT: 37.8 % (ref 36.0–46.0)
Hemoglobin: 13.3 g/dL (ref 12.0–15.0)
Platelets: 179 10*3/uL (ref 150–400)
RBC: 4.41 MIL/uL (ref 3.87–5.11)

## 2010-11-22 LAB — POCT CARDIAC MARKERS
CKMB, poc: 1.5 ng/mL (ref 1.0–8.0)
Troponin i, poc: <0.05 ng/mL (ref 0.00–0.09)

## 2010-11-22 LAB — URINE MICROSCOPIC-ADD ON

## 2010-11-25 LAB — COMPREHENSIVE METABOLIC PANEL
ALT: 12 U/L (ref 0–35)
Alkaline Phosphatase: 54 U/L (ref 39–117)
Chloride: 106 mEq/L (ref 96–112)
Glucose, Bld: 106 mg/dL — ABNORMAL HIGH (ref 70–99)
Potassium: 3.5 mEq/L (ref 3.5–5.1)
Sodium: 139 mEq/L (ref 135–145)
Total Protein: 6.8 g/dL (ref 6.0–8.3)

## 2010-11-25 LAB — CBC
Hemoglobin: 13 g/dL (ref 12.0–15.0)
MCHC: 34 g/dL (ref 30.0–36.0)
RBC: 3.89 MIL/uL (ref 3.87–5.11)
RBC: 4.4 MIL/uL (ref 3.87–5.11)
RDW: 13.8 % (ref 11.5–15.5)
WBC: 12.4 10*3/uL — ABNORMAL HIGH (ref 4.0–10.5)
WBC: 7.4 10*3/uL (ref 4.0–10.5)

## 2010-11-25 LAB — URINE CULTURE
Colony Count: NO GROWTH
Culture: NO GROWTH

## 2010-11-25 LAB — DIFFERENTIAL
Basophils Relative: 0 % (ref 0–1)
Basophils Relative: 0 % (ref 0–1)
Eosinophils Absolute: 0.1 10*3/uL (ref 0.0–0.7)
Lymphs Abs: 2 10*3/uL (ref 0.7–4.0)
Monocytes Absolute: 0.9 10*3/uL (ref 0.1–1.0)
Monocytes Relative: 7 % (ref 3–12)
Monocytes Relative: 7 % (ref 3–12)
Neutro Abs: 4.7 10*3/uL (ref 1.7–7.7)
Neutrophils Relative %: 63 % (ref 43–77)
Neutrophils Relative %: 77 % (ref 43–77)

## 2010-11-25 LAB — BASIC METABOLIC PANEL
Calcium: 9.5 mg/dL (ref 8.4–10.5)
Creatinine, Ser: 0.59 mg/dL (ref 0.4–1.2)
GFR calc Af Amer: >60 mL/min (ref >60)

## 2010-11-25 LAB — URINALYSIS, ROUTINE W REFLEX MICROSCOPIC
Protein, ur: NEGATIVE mg/dL
Urobilinogen, UA: 1 mg/dL (ref 0.0–1.0)

## 2010-12-01 NOTE — Letter (Signed)
Summary: Patient Notice, Colon Biopsy Results  [facility]  [address]   [place], [place] [address]   Phone: [phone]  Fax: [phone]       November 20, 2010   [name] [address] [place], [place]  [address] Jan 29, 1935    Dear Ms. Cordoba,  I am pleased to inform you that the biopsies taken during your recent colonoscopy did not show any evidence of cancer or colitis upon pathologic examination.  Additional information/recommendations:  You should have a repeat colonoscopy examination  in 7 years.  Please call us if you are having persistent problems or have questions about your condition that have not been fully answered at this time.  Sincerely,    R. Roetta Sessions MD, FACP [facility] Ph: [phone]    Fax: [phone]   Appended Document: Patient Notice, Colon Biopsy Results letter mailed to pt  Appended Document: Patient Notice, Colon Biopsy Results CC TO PCP  Appended Document: Patient Notice, Colon Biopsy Results reminder in epic to repeat tcs in 3yrs

## 2010-12-11 LAB — DIFFERENTIAL
Basophils Absolute: 0 10*3/uL (ref 0.0–0.1)
Eosinophils Absolute: 0.1 10*3/uL (ref 0.0–0.7)
Eosinophils Relative: 1 % (ref 0–5)
Lymphocytes Relative: 29 % (ref 12–46)
Lymphocytes Relative: 36 % (ref 12–46)
Lymphs Abs: 1.8 10*3/uL (ref 0.7–4.0)
Lymphs Abs: 2.2 10*3/uL (ref 0.7–4.0)
Monocytes Absolute: 0.6 10*3/uL (ref 0.1–1.0)
Monocytes Relative: 10 % (ref 3–12)
Monocytes Relative: 9 % (ref 3–12)
Neutrophils Relative %: 53 % (ref 43–77)

## 2010-12-11 LAB — CBC
HCT: 37.2 % (ref 36.0–46.0)
Hemoglobin: 12.9 g/dL (ref 12.0–15.0)
MCV: 88.2 fL (ref 78.0–100.0)
Platelets: 244 10*3/uL (ref 150–400)
RBC: 4.23 MIL/uL (ref 3.87–5.11)
RBC: 3.88 MIL/uL (ref 3.87–5.11)
RDW: 14.1 % (ref 11.5–15.5)
WBC: 6.2 10*3/uL (ref 4.0–10.5)

## 2010-12-11 LAB — BASIC METABOLIC PANEL
GFR calc non Af Amer: >60 mL/min (ref >60)
Glucose, Bld: 101 mg/dL — ABNORMAL HIGH (ref 70–99)
Potassium: 3.9 mEq/L (ref 3.5–5.1)
Sodium: 138 mEq/L (ref 135–145)

## 2010-12-11 LAB — FERRITIN: Ferritin: 59 ng/mL (ref 10–291)

## 2010-12-11 LAB — POCT CARDIAC MARKERS
Myoglobin, poc: 65 ng/mL (ref 12–200)
Troponin i, poc: <0.05 ng/mL (ref 0.00–0.09)
Troponin i, poc: <0.05 ng/mL (ref 0.00–0.09)

## 2010-12-19 LAB — COMPREHENSIVE METABOLIC PANEL
ALT: 13 U/L (ref 0–35)
AST: 19 U/L (ref 0–37)
Albumin: 3.8 g/dL (ref 3.5–5.2)
Alkaline Phosphatase: 54 U/L (ref 39–117)
CO2: 26 mEq/L (ref 19–32)
Chloride: 109 mEq/L (ref 96–112)
Creatinine, Ser: 0.7 mg/dL (ref 0.4–1.2)
GFR calc Af Amer: >60 mL/min (ref >60)
GFR calc non Af Amer: >60 mL/min (ref >60)
Potassium: 3.6 mEq/L (ref 3.5–5.1)
Sodium: 141 mEq/L (ref 135–145)
Total Bilirubin: 0.4 mg/dL (ref 0.3–1.2)

## 2011-01-17 NOTE — H&P (Signed)
NAMEREHA, MARTINOVICH NO.:  [id number]   MEDICAL RECORD NO.:  [id number]          PATIENT TYPE:  OBV   LOCATION:  2021                         FACILITY:  [facility]   PHYSICIAN:  Rollene Rotunda, MD, FACCDATE OF BIRTH:  1935-02-15   DATE OF ADMISSION:  11/28/2007  DATE OF DISCHARGE:  11/29/2007                              HISTORY & PHYSICAL   PRIMARY CARDIOLOGIST:  Dr. Donnamarie Rossetti.   PRIMARY CARDIOLOGIST:  Dr. Shaune Pollack.   ONCOLOGY:  Dr. Mariel Sleet.   Ms. Martinez is a very pleasant 76 year old Caucasian female status post  previous catheterization in 1993 and again in 2003 that revealed  nonobstructive disease with a normal ejection fraction at that time.  Since that time the patient had been diagnosed with breast cancer and  underwent a left lumpectomy x2 with follow-up radiation and oral  chemotherapy.  She has chronic pain in her left chest since her breast  cancer surgery and also a neck surgery done in 2005.  Ms. Siemen has  experienced some left chest discomfort over the last two days different  from her normal chronic left chest pain and also different from her GERD  symptoms in the past.  She describes it as a 200 pound elephant sitting  on my chest.  It  apparently waxed and waned for the last two days.  This morning she got up, and had planned on spending the day shopping  with her husband.  They were driving down from [place].  She  experienced the discomfort again.  She states that it was worse today.  It was an 8 on a scale of 1-10.  She felt like she needed to belch.  She  took some Tums without relief.  They arrived in [place].  The chest  discomfort continued.  Her husband became concerned and brought her to  the emergency room for further evaluation.  Here she was given four baby  chewable aspirin and oxygen.  She states the chest heaviness completely  resolved.  She denies any shortness of breath associated with it.  However, she does complain  of being diaphoretic and clammy at times.  Currently is pain free.   PAST MEDICAL HISTORY:  1. Hiatal hernia and GERD.  2. Syncope status post EP evaluation in the past showing      neurocardiogenic syncope.  3. History of hyperthyroidism.  4. Previous cardiac catheterizations x2 showing nonobstructive disease      and normal EF.  5. Dyslipidemia with patient stating she is intolerant of statins.  6. Breast cancer status post lumpectomy x2 in 2003 to the left breast.      Underwent radiation or chemotherapy.  The patient is unable to      recall the name of the chemotherapy agent.  7. Status post cholecystectomy.  8. Hypertension.  9. Irritable bowel syndrome - diarrhea   ALLERGIES/INTOLERANCES:  Intolerance to STATINS.   MEDICATIONS:  1. Synthroid 125 mcg.  2. Nexium.  3. Lotrel 5/20.  4. Multivitamin.  5. Caltrate daily.   SOCIAL HISTORY:  She lives in [place] with her husband.  She is a  housewife.  She has four adult children.  This apparently is her second  marriage.  States she does not do anything for exercise.  She has a very  sedentary lifestyle.  Denies any tobacco, drugs or herbal medications,  EtOH use.  No diet restrictions.  Mother deceased secondary to  pancreatic cancer.  Father deceased at age 42 from MI.   REVIEW OF SYSTEMS:  Positive for chills, sweats, headaches, chest pain,  occasional palpitations, numbness in fingers and left hand, but this is  chronic, ongoing pain in neck and left arm which is chronic, GERD  symptoms.  All other systems reviewed and negative.   PHYSICAL EXAMINATION:  VITAL SIGNS:  Temp 99.3, heart rate 64-80,  respirations 20-22, blood pressure originally 189/101, currently 178/89.  GENERAL:  In no acute distress.  HEENT:  Normal.  NECK:  Supple without lymphadenopathy, no bruits, no JVD.  CARDIOVASCULAR:  Reveals S1, S2, regular rate and rhythm.  The patient  has chronic palpable tenderness in her left chest.  LUNGS:  Clear  to auscultation.  SKIN:  Warm and dry.  ABDOMEN:  Soft, nontender, positive bowel sounds.  EXTREMITIES:  Lower extremities without clubbing, cyanosis or edema.  NEUROLOGICAL:  Alert and oriented x3.   Chest x-ray showing no acute findings.  EKG:  Sinus rhythm at a rate of  74.   LABORATORY WORK:  First point of care markers negative.  H&H 13.3 and  39.  Potassium 4.4, BUN and creatinine 16 and 0.7   IMPRESSION:  1. Chest pain of unclear etiology at this time.  Admit patient for      observation to telemetry to rule out enzymes negative.  Outpatient Cardiolite scheduled in our office tomorrow at 11:45.  1. Hypertension poorly controlled.  Increase Lotrel from 5/20 to      10/40.  The patient will need follow-up blood work next week.  2. Dyslipidemia.  The patient states intolerance to STATINS.  Check      lipid panel while she is here.  Dr. Rollene Rotunda in to examine      and assess patient and agrees with plan of care.      Dorian Pod, ACNP      Rollene Rotunda, MD, [facility]  Electronically Signed    MB/MEDQ  D:  11/28/2007  T:  11/29/2007  Job:  [id number]

## 2011-01-17 NOTE — Discharge Summary (Signed)
NAMEMAYETTA, CASTLEMAN NO.:  [id number]   MEDICAL RECORD NO.:  [id number]          PATIENT TYPE:  OBV   LOCATION:  2021                         FACILITY:  [facility]   PHYSICIAN:  Luis Abed, MD, FACCDATE OF BIRTH:  11/05/1934   DATE OF ADMISSION:  11/28/2007  DATE OF DISCHARGE:  11/29/2007                         DISCHARGE SUMMARY - REFERRING   BRIEF HISTORY:  Ms. Elsberry is a 76 year old white female who presented  with a 200 pound elephant sitting on her chest that has waxed and  waned since the morning.  She initially attributed it to gas and took  Tums without relief.  This occurred while shopping with her husband. She  decided to come to the emergency room because it was not improving.  In  the emergency room, she received aspirin and oxygen with resolution of  her discomfort.  This was associated with diaphoresis.   PAST MEDICAL HISTORY:  1. She does have a history of hiatal hernia.  2. GERD.  3. Hypothyroidism.  4. Hypertension.  5. Irritable bowel.  6. Catheterization in 2003 showed normal coronaries and EF.  7. Hyperlipidemia.  8. Breast cancer treated with radiation and chemo in 2003.   DIAGNOSTICS:  1. Chest x-ray on November 28, 2007 did not show any acute processes.      Admission weight was 94.3 kg.  2. EKG showed normal sinus rhythm, left axis deviation, PVC, first-      degree AV block.   LABORATORY DATA:  Admission hematology was 12.8, 38.0, normal indices,  platelets 272, WBCs 6.3.  Chemistry showed a sodium of 141, potassium  3.7, BUN 14, creatinine 0.72.  Normal LFTs.  Protein and albumin were  slightly low at 5.5 and 3.1.  CK-MBs relative indexes and troponins were  negative for myocardial infarction.  TSH was within normal limits.  Lipids showed a total cholesterol 231, triglycerides 171, HDL 41, LDL  156.   HOSPITAL COURSE:  Ms. Luoma was admitted to 2000, placed on Lovenox and  her home medications.  Overnight, she did not have any  further chest  discomfort.  She had ruled out for myocardial infarction.  Dorian Pod, nurse practitioner and Dr. Antoine Poche on admission felt that if  she had ruled out, she could be discharged today with an adenosine  stress Myoview that was arranged for 11:45 in the office.  Dr. Myrtis Ser  reviewed and agreed.  Thus she was discharged home.   DISCHARGE DIAGNOSES:  1. Prolonged atypical chest discomfort of uncertain etiology.  2. Hypertension with admission blood pressure of 189/101.  3. Hyperlipidemia.  4. Obesity.  5. History as previously.   DISPOSITION:  The patient is discharged home.   FINAL IMPRESSION:  1. Activity is not restricted.  2. Wound care not applicable.  3. She was asked to continue her Synthroid 125 mcg daily; Nexium 40 mg      daily; Lotrel 5/20 mg daily; multivitamin, Caltrate as previously.  4. She was asked to begin a blood pressure diary.  Bring all medicines      and diary to all appointments.  5. Adenosine Myoview will be performed at the [facility] office      today at 11:45.  6. She will follow up with Dr. Dietrich Pates in [place] on December 04, 2007      at 9:15.      Joellyn Rued, PA-C      Luis Abed, MD, [facility]  Electronically Signed    EW/MEDQ  D:  11/29/2007  T:  11/29/2007  Job:  [id number]   cc:   Ramon Dredge L. Juanetta Gosling, M.D.  Ladona Horns. Mariel Sleet, MD  Gerrit Friends. Dietrich Pates, MD, [facility]  Luis Abed, MD, [facility]

## 2011-01-17 NOTE — Letter (Signed)
December 04, 2007    Edward L. Juanetta Gosling, M.D.  [address]  [place], [place] [address]   RE:  KYLEEANN, CREMEANS  MRN:  [id number]  /  DOB:  1935-04-13   Dear Ed:   Ms. Dickerman is seen in the office after a recent admission to [facility].  She presented with severe chest pressure.  Myocardial  infarction was ruled out and a stress nuclear study performed the day of  discharge was negative.  Nonetheless, she reports a bewildering variety  of symptoms, some of which have been present for 50 years.  She takes me  back to the onset of thyroid disease with initial thyroid replacement  therapy and then to thyroid surgical procedures.  Ultimately, she  appears to have had a total thyroidectomy.  She was subsequently thought  to be hyperthyroid and has had her dose of levothyroxine progressively  decreased since 2003, most recently had a dose of 0.125 mg daily.  She  describes longstanding sweats, possibly with exercise.  She has  exertional dyspnea fatigue and chronic fatigue.  She notes that she felt  much better than she has in years when she was receiving oxygen in the  hospital.  She describes longstanding extreme coldness of her  extremities, particularly her feet.   MEDICATIONS:  In addition to levothyroxine, she takes  amlodipine/benazepril 5/20 mg daily and Nexium 40 mg daily.   PHYSICAL EXAMINATION:  GENERAL:  On exam, pleasant woman in no acute  distress.  VITAL SIGNS:  The weight is 207, 6 pounds less than in 1996.  Blood  pressure 120/85, heart rate 70 and regular, respirations 13.  NECK:  No jugular venous distention; normal carotids without bruits;  prominent surgical scar at the base of the neck.  LUNGS:  Clear.  CARDIAC:  Normal first and second heart sounds; fourth heart sound  present.  ABDOMEN:  Soft and nontender; no organomegaly.  EXTREMITIES:  No edema; distal pulses intact.   IMPRESSION:  Ms. Corbridge has nonspecific symptoms that will likely be very  hard to track  down.  She had normal TSH in the hospital, but insists  that thyroid disease has always been a problem in the past.  We will  obtain a free T4 and free T3 level as well.  She had minimal anemia on  her admission CBC, certainly nothing that would cause symptoms.  She  clearly is overweight and physically deconditioned.  I suggested  exercise and weight loss.  She was given nitroglycerin should she  experience recurrent chest tightness.  She will take an 81 mg aspirin  per day.  She has no history to suggest sleep apnea, but this will  remain a consideration.  I will plan to reassess this nice woman again  in 1 month.    Sincerely,      Gerrit Friends. Dietrich Pates, MD, [facility]  Electronically Signed    RMR/MedQ  DD: 12/04/2007  DT: 12/04/2007  Job #: [id number]

## 2011-01-20 NOTE — Procedures (Signed)
   [name], [name]                          ACCOUNT NO.:  [id number]   MEDICAL RECORD NO.:  [id number]                   PATIENT TYPE:  EMS   LOCATION:  ED                                   FACILITY:  [facility]   PHYSICIAN:  Edward L. Juanetta Gosling, M.D.             DATE OF BIRTH:  1934-12-17   DATE OF PROCEDURE:  DATE OF DISCHARGE:  12/15/2002                                EKG INTERPRETATION   RESULTS:  Rhythm was sinus rhythm with a rate of 60's.  Normal EKG.                                               Edward L. Juanetta Gosling, M.D.    ELH/MEDQ  D:  12/16/2002  T:  12/16/2002  Job:  [id number]

## 2011-01-20 NOTE — Op Note (Signed)
[facility]  Patient:    [name], [name] Visit Number: [id number] MRN: [id number]          Service Type: DSU Location: DAY Attending Physician:  Barbaraann Barthel Dictated by:   Barbaraann Barthel, M.D. Proc. Date: 02/26/02 Admit Date:  02/26/2002   CC:         Kari Baars, M.D.   Operative Report  PREOPERATIVE DIAGNOSIS:  Abnormal left mammogram.  POSTOPERATIVE DIAGNOSIS:  Abnormal left mammogram, final pathology pending.  OPERATION:  Left partial mastectomy.  SURGEON:  Barbaraann Barthel, M.D.  NOTE:  This is a 76 year old white female who had a palpable mass in the upper mid to outer quadrant of the left breast that was suspicious on clinical examination and also suspicious on mammography.  We had discussed the need for surgery to rule out carcinoma, and the procedure was thus discussed in detail with her, discussing complications not limited to but including bleeding, infection, the possibility of further surgery being required.  Informed consent was obtained.  GROSS OPERATIVE FINDINGS:  The patient had what appeared to be a hard, approximately 2 cm mass within fatty breast tissue.  It was suspicious to me for possible carcinoma.  The breast tissue was biopsied completely around this area.  This was sent for permanent section.  TECHNIQUE:  The patient was placed in supine position.  After the adequate administration of general anesthesia by endotracheal intubation, her left hemithorax was prepped with Betadine solution and draped in the usual manner. Curvilinear incision was carried out over the mass that had been previously been marked with a sterile marking pen, and skin, subcutaneous tissue, and breast tissue was excised.  The firm tissue was palpated, and this was within the fiber glandular tissue of the breast.  This was excised all around this circumferentially and down almost to the pectoralis major fascia.  The wound was irrigated  with sterile water after controlling the bleeding with a cautery device.  Then the breast tissue was approximated with 3-0 Polysorb, and the wound was closed subcuticularly with a 5-0 Polysorb suture.  Steri-Strips were used to further approximate the skin.  No drains were placed.  Estimated blood loss was minimal.  The patient received approximately 500 cc of crystalloid intraoperatively.  There were no complications. Dictated by:   Barbaraann Barthel, M.D. Attending Physician:  Barbaraann Barthel DD:  02/26/02 TD:  02/27/02 Job: [id number]

## 2011-01-20 NOTE — Op Note (Signed)
NAMEMEEKA, CARTELLI NO.:  [id number]   MEDICAL RECORD NO.:  [id number]          PATIENT TYPE:  EMS   LOCATION:  ED                            FACILITY:  [facility]   PHYSICIAN:  Lionel December, M.D.    DATE OF BIRTH:  [birth year]   DATE OF PROCEDURE:  08/20/2004  DATE OF DISCHARGE:  08/20/2004                                 OPERATIVE REPORT   PROCEDURE:  Esophagogastroduodenoscopy with esophageal dilation.   INDICATIONS:  [name] is a 76 year old Caucasian female who presented to the  emergency room this afternoon with complaints of food bolus obstruction of  her esophagus. This occurred while she was eating at a [employer].  Since then, she has not been able to swallow liquids or handle her own  saliva. She is expected to have foreign body in esophagus. She is therefore  undergoing therapeutic procedure. She has history of chronic GERD. She had  lap Nissen about six years ago. She had her EGD/ED in July 2004.   Procedure and risks were reviewed with the patient and informed consent was  obtained.   PREOPERATIVE MEDICATIONS:  Cetacaine spray for pharyngeal topical  anesthesia, Demerol 50 mg IV, Versed 7 IV mg in divided doses.   FINDINGS:  Procedure performed in endoscopy suite. The patient's vital signs  and O2 saturations were monitored during the procedure and remained stable.  The patient was placed in left lateral position, and Olympus video scope was  passed via oropharynx without any difficulty into the esophagus.   Esophagus:  Mucosa was normal. There was no foreign body. There was a ring  at the GI junction which was at 35 cm. There were 2 large pieces of foreign  body, i.e. meat in herniated part of the stomach. These were easily pushed  into the body of the stomach. Hiatus was at 40 cm. She was felt to have  small to moderate size hiatal hernia. There was small clot at GE junction  best seen on retroflexed view. This was felt to be trauma from  impacted  foreign body which was spontaneously passed.   Stomach:  It was empty and distended very well with insufflation. Folds of  the proximal stomach were normal. Examination of the mucosa at body, antrum,  pyloric channel as well as angularis, fundus, and cardia was normal. Hernia  was easily seen on this view.   Duodenum:  Exam reached the bulb and revealed normal mucosa. The scope was  passed to the second part of the duodenum. Mucosa and folds were normal.  Endoscope was withdrawn.   Esophagus was dilated by passing 56- and 58-French Maloney dilator to full  insertion. As dilation was completed, endoscope was passed again, and the  esophagus reexamined. There was a mucosa disruption at GE junction. Pictures  taken for the record. Endoscope was withdrawn. The patient tolerated the  procedure well.   FINAL DIAGNOSES:  1.  Foreign body in esophagus, spontaneously passed into the herniated part      of the stomach. This was easily pushed into gastric body.  2.  Schatzki's  ring which was dilated/disrupted by passing 56- and 58-French      Maloney dilators.  3.  Small to moderate size sliding hiatal hernia.   RECOMMENDATIONS:  1.  She will continue anti-reflux measures and Nexium as before.  2.  Office visit in 4 to 6 weeks from now.     Naje   NR/MEDQ  D:  08/20/2004  T:  08/21/2004  Job:  [id number]   cc:   Ramon Dredge L. Juanetta Gosling, M.D.  [address]  [place]  [place] [address]  Fax: [phone]

## 2011-01-20 NOTE — Consult Note (Signed)
[name], [name]                          ACCOUNT NO.:  [id number]   MEDICAL RECORD NO.:  [id number]                   PATIENT TYPE:  EMS   LOCATION:  ED                                   FACILITY:  [facility]   PHYSICIAN:  Lionel December, M.D.                 DATE OF BIRTH:  July 17, 1935   DATE OF CONSULTATION:  03/02/2003  DATE OF DISCHARGE:                                   CONSULTATION   REQUESTING PHYSICIAN:  Ladona Horns. Mariel Sleet, M.D.   REASON FOR CONSULTATION:  IBS, dysphagia.   HISTORY OF PRESENT ILLNESS:  The patient is a 34 year old Caucasian female,  patient of Dr. Glenford Peers (history of breast cancer), who presents today  for further evaluation of the above stated symptoms.  She complains of  dysphagia to solids for several months.  At times, food has become lodged  and she had to throw the food up.  Occasionally, she will have dysphagia to  liquids.  Symptoms are becoming more frequent.  She occasionally has acid  regurgitation.  She also complains of postprandial lower abdominal sharp  pain followed by diarrhea which occurs 80% of the time after she eats.  She  has nausea and burning epigastric pain on an empty stomach.  Denies any  melena.  Occasionally, she sees bright red blood per rectum especially if  constipated.  Her weight has gradually increased by 25 pounds with better  control of her thyroid disease.  She was overmedicated with Synthroid for  years according to her report.  Currently, she is having a bowel movement  every two to three times daily.  She has never had a colonoscopy.   CURRENT MEDICATIONS:  1. Diovan 80 mg daily.  2. Synthroid 0.125 mg daily.  3. Femara 2.5 mg daily.  4. Multivitamin daily.  5. Caltrate 600 plus D one daily.  6. Zantac 150 mg b.i.d. p.r.n.   ALLERGIES:  PREVACID.   PAST MEDICAL HISTORY:  1. Left breast cancer, status post lumpectomy with 36 treatments of     radiation therapy.  Diagnosed in August 2003, followed by Dr.  Glenford Peers.  2. Vitiligo.  3. She had previously had a laparoscopic cholecystectomy with laparoscopic     Nissen fundoplication about five years ago.  She also had a     paraesophageal hernia repaired at that time.  4. She has had reconstruction of the lower jaw.  5. Left knee arthroscopy.  6. Tubal ligation.  7. Hemorrhoid surgery.  8. Left eye implant.  9. Thyroid surgery twice.  10.      Kidney stones surgery.   FAMILY HISTORY:  Mother died of pancreatic cancer.  Her father died of MI.  No family history of colorectal cancer.   SOCIAL HISTORY:  She has been married since 79.  She has four children.  She is retired.  Denies any  tobacco or alcohol use.   REVIEW OF SYSTEMS:  GASTROINTESTINAL:  Please see HPI for GI and general.  CARDIOPULMONARY:  Denies any shortness of breath or chest pain.  GENITOURINARY:  Denies any dysuria or frequency.   PHYSICAL EXAMINATION:  VITAL SIGNS:  Weight 220, height 5 feet 7 inches,  temperature 97.7, blood pressure 128/72, pulse 68.  GENERAL:  A pleasant well-developed, well-nourished Caucasian female in no  acute distress.  SKIN:  Warm and dry.  No jaundice.  HEENT:  Conjunctivae pink.  Sclerae nonicteric.  Oropharyngeal mucosa moist  and pink, no lesions, erythema or exudate.  NECK:  No lymphadenopathy or thyromegaly.  CHEST:  Lungs are clear to auscultation.  CARDIAC:  Regular rate and rhythm, normal S1 and S2, no murmurs, rubs, or  gallops.  ABDOMEN:  Positive bowel sounds, soft, nontender, nondistended, no  organomegaly or masses.  EXTREMITIES:  No edema.   IMPRESSION:  Ms. [name] is a pleasant 76 year old lady with solid food  dysphagia of several months duration.  It sounds like she has had a couple  of episodes of esophageal food impaction which she has been able to relieve  on her own.  She needs to have an upper endoscopy with possible dilatation  in the near future.  In addition, she complains of lower abdominal pain   associated with diarrhea postprandially.  Symptoms are most consistent with  irritable bowel syndrome.  She has a history of occasional hematochezia, and  given her history of intermittent hematochezia and breast cancer, she needs  to have a colonoscopy in the near future as well.  She is at increased risk  of colon cancer with breast cancer history.   PLAN:  1. Colonoscopy and esophagogastroduodenoscopy with possibly dilatation in     the near future.  2. Trial of Nu Lev one sublingual q.i.d. p.r.n. #18 samples provided.  3. Further recommendations to follow.     Tana Coast, P.A.                        Lionel December, M.D.    LL/MEDQ  D:  03/02/2003  T:  03/02/2003  Job:  [id number]   cc:   [name], MD  [address]  [place]  [place] [address]  Fax: [phone]   Lionel December, M.D.  [address]  [place]  [place] [address]  Fax: [phone]   Oneal Deputy. Juanetta Gosling, M.D.  [address]  [place]  [place] [address]  Fax: [phone]

## 2011-01-20 NOTE — Discharge Summary (Signed)
[facility]  Patient:    ROYALTI, SCHAUF Visit Number: [id number] MRN: [id number]          Service Type: MED Location: 2000 2037 01 Attending Physician:  Mirian Mo Dictated by:   Chinita Pester, N.P. Admit Date:  12/09/2001 Discharge Date: 12/12/2001   CC:         Doylene Canning. Ladona Ridgel, M.D.   Discharge Summary  PRIMARY DIAGNOSIS:  Chest pain.  SECONDARY DIAGNOSIS:  Hiatal hernia.  HISTORY OF PRESENT ILLNESS:  This is a 76 year old female who was transferred from [facility] Emergency Room with episodes of chest pain, nausea, and diaphoresis.  The pain was somewhat atypical by history.  Was relieved in the emergency room with nitroglycerin.  Her workup in the past was a Cardiolite about two years prior to admission which was negative.  She has been sick for months, getting worse headaches.  She has had clammy episodes which do not correlate with exertion.  She also has episodes of 30 minutes after eating when she gets a knot in her chest and some pressure.  She also gets short of breath with this.  She has a history of a hiatal hernia which was "wrapped."  PAST MEDICAL HISTORY: 1. She had a catheterization which was normal 10 years ago in [place]. 2. Cholecystectomy. 3. Hiatal hernia. 4. Hypertension. 5. Hyperlipidemia. 6. Questionable irritable bowel. 7. Because of headaches, CAT scan was done at [facility], which was    negative. 8. History of hyperthyroidism.  HOSPITAL COURSE:  The patient was transferred to [facility] for a cardiac catheterization.  She underwent a cardiac catheterization on 12/09/01, which showed a normal ejection fraction and normal coronaries.  The patient underwent an EP consult for her syncope.  The patient was thought to have neurocardiogenic syncope.  She remained in normal sinus rhythm on telemetry. The patient was eventually discharged to home in stable condition to follow up in the  [place] office.  DISCHARGE MEDICATIONS:  All of her previous medications, except she was to decrease her Synthroid to 0.150 mg q.d.  WOUND CARE:  She was to keep her cath site clean and dry.  FOLLOWUP: 1. TSH in six weeks with Dr. Juanetta Gosling. 2. Check a lipid panel in three months, once her thyroid was okay. Dictated by:   Chinita Pester, N.P. Attending Physician:  Mirian Mo DD:  12/31/01 TD:  01/01/02 Job: [id number]

## 2011-01-20 NOTE — Op Note (Signed)
NAMEKAMY, POINSETT NO.:  [id number]   MEDICAL RECORD NO.:  [id number]          PATIENT TYPE:  INP   LOCATION:  5030                         FACILITY:  [facility]   PHYSICIAN:  Kerrin Champagne, M.D.   DATE OF BIRTH:  Mar 21, 1935   DATE OF PROCEDURE:  01/06/2005  DATE OF DISCHARGE:                                 OPERATIVE REPORT   PREOPERATIVE DIAGNOSIS:  Left C5-C6 and left C6-C7 foraminal stenosis  secondary to spondylosis changes.   POSTOPERATIVE DIAGNOSIS:  Left C5-C6 and left C6-C7 foraminal stenosis  secondary to spondylosis changes.   PROCEDURE:  Left C5-C6 and left C6-C7 [place] foraminotomies with  decompression of the left C6 and C7 nerve roots.   SURGEON:  Kerrin Champagne, M.D.   ASSISTANT:  Wende Neighbors, P.A.-C.   ANESTHESIA:  GOT, Dr. Autumn Patty.   ESTIMATED BLOOD LOSS:  50 mL.   DRAINS:  Foley catheter to straight drain discontinued at the end of the  case.   COMPLICATIONS:  None.   DISPOSITION:  The patient returned to the PACU in good condition.   HISTORY OF PRESENT ILLNESS:  The patient is a 76 year old female who has  been experiencing pain and discomfort radiating into her left arm, it  radiates into a C7 distribution to her middle finger.  The pain is worse  with extension, lateral bending, rotation to the left side.  She has  undergone attempts at conservative management including injections and  selective nerve root blocks.  These have only been temporizing.  Her studies  indicated cervical foraminal stenosis left C5-C6 and C6-C7.  She is brought  to the operating room to undergo foraminal decompression at both of these  segments via foraminotomies posteriorly.   DESCRIPTION OF PROCEDURE:  After adequate general anesthesia with the  patient rolled to a prone position, chest rolls used, all pressure points  well padded, both knees and legs also with thigh high TED hose to prevent  DVT, the arms at the side using skids,  these also well padded, both medial  and lateral to protect the peripheral nerves.  The shoulders were retracted  and taped down into place and padded quite nicely.  The neck in slight  flexion at about 15-20 degrees, [place] horseshoe was used.  A standard  prep with DuraPrep solution over the posterior aspect of the neck extending  from the occiput to about T4, draped in the usual manner, Ioban Vidrape  used.  Standard preoperative antibiotics.  The incision at the expected  prominence of C7 extending superiorly additional 2.5 to 3 cm, through the  skin and subcutaneous layers down to the spinous processes.  At the C7  level, the expected more prominent dropping down as it dipped to C6  superiorly, and the incision carried down to the ligament of nuchal layers.  The incision was then made along the spinous processes on the left side as  expected at C7, C6, and C5 spinous processes.  Clamps were initially placed  on C6 and C7, interoperative radiograph was unable to ascertain the correct  level,  so C-arm fluoroscopy was brought onto the field.  Under C-arm  fluoroscopy, a clamp was placed on the spinous process of C5 and this was  localized.  Cobbs were then used to elevate the paracervical muscles on the  left side off the posterior aspect of the lamina of C5, C6, and C7, removing  the paraspinous muscle attachment to the inferior aspect of the lamina of  C5, C6, and C7.  Bleeding was controlled using bipolar and monopolar  electrocautery.  A retractor then inserted and under loupe magnification and  a headlamp, a high speed bur was used to carefully remove a small portion of  the inferior aspect of the lamina of C5 extending along the medial  facet  resecting the infra-articular facet medially approximately 15-20%.  This was  similarly done at the C6 level removing a small portion of the inferior  aspect of the lamina laterally at C6 and medial aspect of the infra-  articular process of  C6 medially of about 20%.  A bur was then used to thin  the superior lamina of C7 and of C6 and a 1 mm Kerrison able to be  introduced over the superior aspect of the lamina of C6 initially removing a  small portion of the superior lamina of C6.  As this did approximate the  previous drilling of the inferior articular process of C6, an entire  hemilaminectomy was performed on the left side preserving the pars area.  This was done so as to decrease tendencies for discomfort associated with  stress area here.  Next, the supra-articular process of C6 on the left side  was carefully resected over approximately 20-30% decompressing the left C5-  C6 neural foramen of the C6 nerve root observed to be exiting out underneath  the remaining portion of the lateral aspect of the facet unimpeded.  The  hockey stick nerve hook was able to be passed out without evidence of  further compression.  A small amount of residual facet inferiorly was  undercut to further decompress the foramen on this side at C6.  Gelfoam was  then placed, thrombin soaked bone wax applied to the cancellous bone  surfaces, excess bone wax removed.  This provided excellent hemostasis.  Attention was turned to the C6-C7 level on the left where, similarly, the  lateral aspect of the superior portion of the lamina of C7 was carefully  resected using 1 mm and 2 mm Kerrison partially over the superior aspect.  This continued out the foramen over the C7 nerve root performing partial  medial facetectomy of this about 20-30% of the supra-articular process of  C7.  This was carried out over the C7 nerve root.  This was carried out  laterally until the nerve root was free and well decompressed using a nerve  hook to identify the nerve exiting and determining there was no further  pressure within the neural foramen.  Uncovertebral spurs were noted at both levels anterior to the nerve root at each level and felt to be present,  however, not  resected.  Neurovascular sheath holding the nerve roots ventral  against the uncovertebral joints were resected at each level to allow for  the nerve to float posterior at each segment.  This completed, Gelfoam  thrombin soaked placed over the nerve root, small epidural veins cauterized  with any axillary portion of the C7 nerve root, bone wax applied to the  bleeding cancellous bone surfaces where resection had occurred.  Irrigation  was then performed, excess bone wax removed.  Again, examination  demonstrated the nerves exiting without difficulty.  Gelfoam was left in  place.  The soft tissues were allowed to fall back into place.  Bleeders  were controlled using bipolar electrocautery.  The operating room microscope  was used during the procedure after the initial drilling of the facet.  It  was used for the decompression work.  The operating microscope was removed.  The ligament of nuchal layers was reapproximated in the midline with  interrupted 0 Ethibond sutures.  These were nonabsorbable.  An additional  layer over the nuchal layer reapproximated with interrupted 0 Ethibond  sutures.  The deep subcu layer was reapproximated with interrupted 2-0  Vicryl suture, the more superficial layers also with 2-0 Vicryl suture, and  the skin was closed with a  running subcu stitch of 4-0 Vicryl.  Tincture of Benzoin and Steri-Strips  were applied.  4 by 4s were affixed to the skin with Hypofix tape.  The  patient was then returned to supine position, reactivated, extubated, and  returned to the recovery room in satisfactory condition.  Again, all  instrument and sponge counts were correct.      JEN/MEDQ  D:  01/06/2005  T:  01/06/2005  Job:  [id number]   cc:   Ramon Dredge L. Juanetta Gosling, M.D.  [address]  [place]  [place] [address]  Fax: [phone]

## 2011-01-20 NOTE — Consult Note (Signed)
[place]  Patient:    [name], [name] Visit Number: [id number] MRN: [id number]          Service Type: MED Location: 2000 2037 01 Attending Physician:  Mirian Mo Dictated by:   Doylene Canning. Ladona Ridgel, M.D. [facility] Proc. Date: 12/11/01 Admit Date:  12/09/2001 Discharge Date: 12/12/2001   CC:         Gerrit Friends. Dietrich Pates, M.D. [facility]  Kari Baars, M.D.   Consultation Report  INDICATIONS FOR CONSULTATION:  Evaluation of recurrent syncope.  HISTORY OF PRESENT ILLNESS:  The patient is a very pleasant 76 year old woman with a history of chest pain and syncope.  The patient states that her syncopal episodes typically occur in the upright position and are worse when she has not had food to eat.  She states that she will get a sensation of a headache, diaphoresis, and clammy sensation with nausea and often vomiting. These are all followed by her passing out.  When she awakens she typically feels "bad" and this sensation of feeling bad occurs for many minutes to an hour.  These have occurred several times in the last month and, in fact, occurred in the past several years.  She has not had any symptoms while sitting or being in the supine position.  She recently underwent catheterization demonstrating normal LV function and no obstructive coronary disease.  MEDICATIONS:  Diovan, Synthroid, Zantac.  PAST MEDICAL HISTORY:  Catheterization 10 years ago which showed no obstructive coronary disease, history of cholecystectomy and hiatal hernia and is status post endoscopic wrapping for this problem.  She has a history of hypertension and hyperlipidemia.  SOCIAL HISTORY:  The patient lives in [place] with her husband.  She has nine children.  Does not smoke cigarettes, but does occasionally consume alcohol and caffeinated beverages.  FAMILY HISTORY:  Noncontributory.  There are three brothers and two sisters, none of whom have coronary  disease.  REVIEW OF SYSTEMS:  Negative for weight changes, vision or hearing problems. She denies any photophobia.  She does have headaches as previously described. She has a history of back and neck pain.  She denies any skin changes.  She denies orthopnea or PND.  She has syncope as previously described.  She denies claudication.  She does have a sensation of anxiety at times.  She denies hematuria or nocturia, but does have urinary frequency.  She denies weakness, numbness, depression, or anxiety.  She denies nausea, vomiting, diarrhea, or constipation.  She denies polyuria, polydipsia, or skin or hair changes.  PHYSICAL EXAMINATION  GENERAL:  She is a pleasant, well-appearing, moderately obese woman in no distress.  VITAL SIGNS:  Blood pressure 120/62, pulse 66 and regular, respirations 18, weight 211 pounds.  HEENT:  Normocephalic, atraumatic.  Pupils equal and round.  Oropharynx was moist.  NECK:  No jugular venous distention.  No thyromegaly.  Trachea was midline. Carotid upstrokes were 2+ and symmetric.  CARDIOVASCULAR:  Regular rate and rhythm with normal S1 and S2.  There was a soft systolic murmur at the left lower sternal border.  I did not appreciate any gallops or rubs.  LUNGS:  Clear bilaterally to auscultation.  There were no wheezes or rhonchi.  ABDOMEN:  Obese, nontender, nondistended.  EXTREMITIES:  No clubbing, cyanosis, edema, or petechial lesions.  MUSCULOSKELETAL:  No joint deformities, effusions, kyphosis, or scoliosis.  NEUROLOGIC:  Alert and oriented x3.  Cranial nerves II-XII grossly intact. Muscles 5/5 and symmetric.  LABORATORIES:  EKG demonstrates normal  sinus rhythm.  TSH 0.045 which is decreased.  IMPRESSION: 1. Recurrent syncope and presyncope. 2. Recurrent headache. 3. Hyperlipidemia. 4. Low TSH consistent with hyperthyroidism.  DISCUSSION:  The patients symptoms are fairly typical for an neurally mediated syncope.  These certainly  could be made worse by her probable hyperthyroidism.  I would recommend a diet of increased salts and fluid intake.  Would also consider discontinuation of her Avapro as it may be responsible for some venal dilatation and arterial dilatation.  If dietary measures alone do not improve her symptoms then we would consider head up tilt table testing and initiation of a centrally acting drug, notably Zoloft, Paxil, or Celexa. Dictated by:   Doylene Canning. Ladona Ridgel, M.D. [facility] Attending Physician:  Mirian Mo DD:  12/11/01 TD:  12/12/01 Job: [id number]

## 2011-01-20 NOTE — Op Note (Signed)
NAME:  DEDRIA, ENDRES                          ACCOUNT NO.:  [id number]   MEDICAL RECORD NO.:  [id number]                   PATIENT TYPE:  AMB   LOCATION:  DAY                                  FACILITY:  [facility]   PHYSICIAN:  Lionel December, M.D.                 DATE OF BIRTH:  1935/07/01   DATE OF PROCEDURE:  03/16/2003  DATE OF DISCHARGE:                                  PROCEDURE NOTE   PROCEDURE:  Esophagogastroduodenoscopy with esophageal dilatation followed  by total colonoscopy.   INDICATIONS:  Mrs. [name] is a 76 year old Caucasian female who presents with  intermittent solid food dysphagia.  She has history of GERD and has  undergone a laparoscopic Nissen about five years ago.  However, now she is  back to having epigastric pain and heartburn as well as hoarseness.  She was  on Zantac and also has been on maximal amount until recently which seemed to  help.  She is undergoing EGD.  She is also undergoing colonoscopy because of  lower abdominal pain as well as diarrhea.  These symptoms are felt to be due  to irritable bowel syndrome but need to be sure she does not have colonic  neoplasm or colitis.  Procedures were reviewed with the patient and informed  consent was obtained.   PREMEDICATION:  Cetacaine spray for pharyngeal topical anesthesia, Demerol  50 mg IV, Versed 10 mg IV in divided doses.   FINDINGS:  The procedures were performed in the endoscopy suite.  The  patient's vital signs and O2 saturation were monitored during the procedure.   ESOPHAGOGASTRODUODENOSCOPY:  The patient was placed in the left lateral  recumbent position and Olympus videoscope was passed through the oropharynx  without any difficulty into the esophagus.   Esophagus:  Mucosa of the esophagus was normal.  She had a ring at the EG  junction. There was a moderate size sliding hiatal hernia.  The GE junction  was estimated to be at 33 cm and diaphragmatic hiatus was at 38 cm.   Stomach:  It was  empty and distended very well on insufflation.  Folds of  the proximal stomach were normal.  Examination of the mucosa, gastric body,  antrum, pyloric channel, as well as fundus and cardia was normal other than  a very short fundal wrap.   Duodenum:  Examination of the bulb and second part of the duodenum was  normal.   The endoscope was withdrawn and esophagus was dilated by passing a 56-French  Savary dilator through the esophagus completely.  After dilator was  withdrawn, endoscope was passed again and there was a small tear in the  cervical esophagus suggesting esophageal web along with a tear at the GE  junction disrupting the ring.  The endoscope was withdrawn and the patient  was prepared for procedure #2.   TOTAL COLONOSCOPY:  Rectal examination performed.  No abnormality  noted on  the external or digital exam.  Scope was placed in the rectum and advanced  under vision to the sigmoid colon and beyond.  Preparation was excellent.  Scattered diverticula were noted throughout the colon but there were mainly  in the sigmoid colon.  Very redundant colon.  I was able to pass the scope  to the level of the ileocecal valve which was well identified.  However,  could not see the mucosa behind the valve or appendiceal orifice.  As the  scope was withdrawn, colonic mucosa was once again carefully examined.  There was two small submucosal lesions at the ascending colon close to cecum  with features typical of lipomas and these were left alone.  There are no  polyps or masses.  Rectal mucosa was normal.  Scope was retroflexed,  examined the anorectal junction which was unremarkable. Endoscope was  straightened and withdrawn. The patient tolerated the procedure well.   FINAL DIAGNOSES:  1. Moderate size sliding hiatal hernia with very short fundal wrap which     appears to be functionally incompetent.  2. Distal esophageal ring and esophageal wrap.  Both were disrupted by     passing  56-French [facility] dilator.  3. Pancolonic diverticulosis with redundant colon.  Cecal area behind the     ileocecal valve was not well seen.   RECOMMENDATIONS:  1. She will continue antireflux measures.  She will go back on Nexium at 40     mg p.o. q.a.m.  prescription given for 90 with one refill.  2. She will continue Citrucel but increase to two tablets a day and stay on     high fiber diet and use NuLev one sublingual q.i.d. p.r.n.  Prescription     given for 60 with two refills.  3. Will plan to bring her back in a few weeks for a barium enema so that her     cecal evaluation can be completed.                                                Lionel December, M.D.    NR/MEDQ  D:  03/16/2003  T:  03/16/2003  Job:  [id number]   cc:   [name], MD  [address]  [place]  [place] [address]  Fax: [phone]

## 2011-01-20 NOTE — H&P (Signed)
[place]. [facility]  Patient:    [name], [name] Visit Number: [id number] MRN: [id number]          Service Type: MED Location: 2000 2037 01 Attending Physician:  Mirian Mo Dictated by:   Jesse Sans Wall, M.D. [facility] Admit Date:  12/09/2001 Discharge Date: 12/12/2001   CC:         Gerrit Friends. Dietrich Pates, M.D. [facility]  Kari Baars, M.D., [place], [place]   History and Physical  CHIEF COMPLAINT:  Chest pain.  HISTORY OF PRESENT ILLNESS:  [name] is a 76 year old married white female who is transferred from [facility] Emergency Room today for episodes of chest pain with nausea and diaphoresis.  The chest pain was somewhat atypical by history but was relieved in the emergency room with nitroglycerin. Her cardiac workup in the past was a Cardiolite about two years ago which was negative per the patient.  She has been "sick for months," getting worse headaches.  She has had clammy episodes which do not correlate with exertion.  She has also had episodes 30 minutes after eating where she gets a knot in her chest and some pressure; she also gets short of breath with this.  She has had a history of a hiatal hernia which was "wrapped."  PAST MEDICAL HISTORY:  She had a normal cath about 10 years ago in [place].  She has had a cholecystectomy.  She has had the hiatal hernia wrapped.  She has a history of hypertension.  There is a history of hyperlipidemia as well.  She has a questionable history of irritable bowel syndrome.  Because of headache, she had a CAT scan today at [facility] which was negative.  She has a history of hypothyroidism.  MEDICATIONS: 1. Diovan -- unknown dose q.d. 2. Synthroid 0.2 mg q.d. 3. Zantac.  SOCIAL HISTORY:  She lives in [place] with her husband.  They have nine children between them.  She does not smoke but occasionally has some alcohol. 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  FAMILY HISTORY:   Her family history is really noncontributory.  She has three brothers and two sisters, all of which do not have coronary disease, however, she has a daughter who had a heart attack in her 41s.  REVIEW OF SYSTEMS:  Review of systems is really noncontributory.  PHYSICAL EXAMINATION:  VITAL SIGNS:  Blood pressure is 164/84.  Her pulse is 72 and regular. Temperature is 99.1.  Her respiratory rate is 18 and unlabored.  GENERAL:  She is very pleasant.  SKIN:  Warm and dry.  NEUROLOGICAL:  Exam is grossly intact.  HEENT:  Normal.  NECK:  There are no carotid bruits and there is no JVD.  CARDIAC:  Regular rate and rhythm without gallop.  There is a very soft systolic murmur at the apex.  LUNGS:  Clear to auscultation and percussion.  ABDOMEN:  Soft with good bowel sounds.  Organomegaly was difficult to assess. There is no obvious point tenderness.  EXTREMITIES:  No cyanosis, clubbing or edema.  Pulses were brisk bilaterally.  LABORATORY AND ACCESSORY DATA:  Chest x-ray showed mild cardiac enlargement with no acute disease otherwise.  EKG shows normal sinus rhythm with left axis deviation.  She may have old Q waves in III and aVF but it is difficult to tell.  ASSESSMENT: 1. Chest pain, some features worrisome for ischemic heart disease, others more    concerning for esophageal discomfort. 2. Hypertension. 3. Hyperlipidemia. 4.  Obesity. 5. Family history of coronary artery disease. 6. Anxiety.  PLAN:  Diagnostic catheterization tomorrow.  I have discussed with the patient.  She agrees to proceed after hearing the indications and risks and potential benefit.  We will treat with aspirin, beta blockers and p.r.n. nitroglycerin at present.  We will also check fasting lipids in the morning.  If her cardiac catheterization shows no obstructive disease, GI consultation should be obtained.Dictated by:   Jesse Sans Wall, M.D. [facility] Attending Physician:  Mirian Mo DD:   12/09/01 TD:  12/10/01 Job: [id number]

## 2011-01-20 NOTE — Consult Note (Signed)
[name], [name] NO.:  [id number]   MEDICAL RECORD NO.:  [id number]            PATIENT TYPE:   LOCATION:                                 FACILITY:   PHYSICIAN:  Barbaraann Barthel, M.D.      DATE OF BIRTH:   DATE OF CONSULTATION:  07/05/2004  DATE OF DISCHARGE:                                   CONSULTATION   Dear [name],     I saw [name] in my office on July 05, 2004, at which time I excised  a soft tissue mass from her anterior medial aspect of her right elbow.  I  had reviewed the MRI and this appeared to be a soft tissue mass consistent  with a lipoma, as indeed it appeared to be clinically.  This was removed  under local anesthesia without complications.  I will make sure you get a  copy of the pathology reports for your record.     As you know, there is concern due to her previous pathology of stage IIA  lobular infiltrating carcinoma of the left breast.  At any rate, this was  taken care of locally, and I will make sure that the pathology reaches the  oncology clinic.     Again, I am happy to be of help.     Yours sincerely,   [name], M.D.     Will   WB/MEDQ  D:  07/05/2004  T:  07/05/2004  Job:  [id number]   cc:   [name], MD  [address]  [place]  [place] [address]  Fax: [phone]

## 2011-01-20 NOTE — Op Note (Signed)
[facility]  Patient:    [name], [name] Visit Number: [id number] MRN: [id number]          Service Type: DSU Location: DAY Attending Physician:  Barbaraann Barthel Dictated by:   Barbaraann Barthel, M.D. Proc. Date: 03/12/02 Admit Date:  03/12/2002 Discharge Date: 03/12/2002   CC:         Butch Penny, M.D.  Franky Macho, M.D.   Operative Report  PREOPERATIVE DIAGNOSIS:  Lobular carcinoma of left breast.  POSTOPERATIVE DIAGNOSIS: Lobular carcinoma of left breast.  OPERATION:  Wide excision (left partial mastectomy) left breast with left axillary sentinel node biopsy x 2.  SURGEON:  Barbaraann Barthel, M.D.  ASSISTANT:  Franky Macho, M.D. Valorie Roosevelt)  CLINICAL NOTE:  This is a 76 year old white female who presented with an abnormal mammogram, had undergone a needle localization and biopsy, and found to have approximately a 2 cm mass in the mid upper quadrant of her left breast.  This was found to be lobular carcinoma with positive margins found around the biopsy site.  We had discussed further treatment with her, and the patient opted for a wide excision and sentinel lymph node biopsy after discussing other options including left partial mastectomy and axillary dissection with her.  We had discussed the complications not limited to but including bleeding, infection, and swelling of her arm, numbness, and the possibility that further surgery may be needed.  Informed consent was obtained.  GROSS OPERATIVE FINDINGS:  The wide excision was performed completely around the previous scar and removing in toto the biopsy site.  This was marked with a suture to the superior medial margin to help orient the pathologist, and clips were placed in the biopsy site in a circular fashion where the tumor was previously located.  The patient had two lymph nodes noted that had high counts with a NeoProbe, and these were sent for frozen section, both of which were  negative for carcinoma.  TECHNIQUE:  The patient was placed in the supine position after the adequate administration of general anesthesia by endotracheal intubation.  Her entire left hemithorax and left arm and axilla were prepped with Betadine solution and draped in the usual manner.  Two hours prior to this, the patient had been injected around the nipple areolar complex, and the patient was taken to the operating room for wide excision and sentinel node biopsy.  After prepping and draping, we injected circumferentially around the scar tissue approximately 5 cc of 1% solution of blue dye.  This was injected intradermally and massaged for approximately 5 minutes.  We then used the Ethicon NeoProbe, and we were able to find skin counts in the left axilla that reached approximately 1150 counts per second.  We marked this with the sterile marking pen and made a transverse incision over this area and dissected down and removed a lymph node after clipping its pedicle which had in vivo counts of 2100 and exvivo counts of 2180.  The dye was noted to be blue in color.  We then found another node that produced high counts per second, that produced in vivo counts of approximately 2090 with counts per second ex vivo of 2118, and this was also colored blue.  The axillary basin had low counts after that of approximately 19 to 24 counts per second.  Frozen section on both of these specimens were negative for carcinoma.   We then irrigated the wide excision site, approximating the subcutaneous layer with 3-0 Polysorb and closing the skin with  a stapling device.  The axillary area was likewise closed with the stapling device.  No drain was placed.  Estimated blood loss was less than 100 cc.  The patient received approximately 1100 cc of crystalloid intraoperatively.  There were no complications.  After closure, all sponge, needle, and instrument counts were found to be correct.  The patient was  taken to the recovery room in satisfactory condition. Dictated by:   Barbaraann Barthel, M.D. Attending Physician:  Barbaraann Barthel DD:  03/12/02 TD:  03/16/02 Job: [id number]

## 2011-03-07 ENCOUNTER — Encounter (HOSPITAL_COMMUNITY): Payer: Medicare Other | Attending: Oncology | Admitting: Oncology

## 2011-03-07 ENCOUNTER — Other Ambulatory Visit (HOSPITAL_COMMUNITY): Payer: Self-pay | Admitting: Oncology

## 2011-03-07 DIAGNOSIS — C50919 Malignant neoplasm of unspecified site of unspecified female breast: Secondary | ICD-10-CM

## 2011-04-03 ENCOUNTER — Ambulatory Visit (HOSPITAL_COMMUNITY)
Admission: RE | Admit: 2011-04-03 | Discharge: 2011-04-03 | Disposition: A | Discharge status: Home / Self Care | Payer: Medicare Other | Source: Ambulatory Visit | Attending: Oncology | Admitting: Oncology

## 2011-04-03 DIAGNOSIS — M899 Disorder of bone, unspecified: Secondary | ICD-10-CM | POA: Insufficient documentation

## 2011-04-03 DIAGNOSIS — C50919 Malignant neoplasm of unspecified site of unspecified female breast: Secondary | ICD-10-CM

## 2011-04-27 ENCOUNTER — Emergency Department (HOSPITAL_COMMUNITY)
Admission: EM | Admit: 2011-04-27 | Discharge: 2011-04-27 | Disposition: A | Discharge status: Home / Self Care | Payer: Medicare Other | Attending: Emergency Medicine | Admitting: Emergency Medicine

## 2011-04-27 ENCOUNTER — Other Ambulatory Visit: Payer: Self-pay

## 2011-04-27 ENCOUNTER — Emergency Department (HOSPITAL_COMMUNITY): Payer: Medicare Other

## 2011-04-27 ENCOUNTER — Encounter (HOSPITAL_COMMUNITY): Payer: Self-pay | Admitting: *Deleted

## 2011-04-27 DIAGNOSIS — K589 Irritable bowel syndrome without diarrhea: Secondary | ICD-10-CM | POA: Insufficient documentation

## 2011-04-27 DIAGNOSIS — R079 Chest pain, unspecified: Secondary | ICD-10-CM | POA: Insufficient documentation

## 2011-04-27 DIAGNOSIS — I10 Essential (primary) hypertension: Secondary | ICD-10-CM | POA: Insufficient documentation

## 2011-04-27 DIAGNOSIS — Z853 Personal history of malignant neoplasm of breast: Secondary | ICD-10-CM | POA: Insufficient documentation

## 2011-04-27 DIAGNOSIS — I2510 Atherosclerotic heart disease of native coronary artery without angina pectoris: Secondary | ICD-10-CM | POA: Insufficient documentation

## 2011-04-27 LAB — BASIC METABOLIC PANEL
Calcium: 10.1 mg/dL (ref 8.4–10.5)
GFR calc Af Amer: >60 mL/min (ref >60)
GFR calc non Af Amer: >60 mL/min (ref >60)
Glucose, Bld: 100 mg/dL — ABNORMAL HIGH (ref 70–99)
Potassium: 3.9 mEq/L (ref 3.5–5.1)
Sodium: 138 mEq/L (ref 135–145)

## 2011-04-27 LAB — CBC
Hemoglobin: 11.5 g/dL — ABNORMAL LOW (ref 12.0–15.0)
MCH: 29.5 pg (ref 26.0–34.0)
MCHC: 33.1 g/dL (ref 30.0–36.0)
Platelets: 220 10*3/uL (ref 150–400)
RDW: 13.6 % (ref 11.5–15.5)

## 2011-04-27 LAB — CARDIAC PANEL(CRET KIN+CKTOT+MB+TROPI)
CK, MB: 3 ng/mL (ref 0.3–4.0)
Troponin I: <0.3 ng/mL (ref <0.30)

## 2011-04-27 MED ORDER — PANTOPRAZOLE SODIUM 40 MG IV SOLR
40.0000 mg | Freq: Once | INTRAVENOUS | Status: AC
  Administered 2011-04-27: 40 mg via INTRAVENOUS

## 2011-04-27 NOTE — ED Notes (Signed)
Pt primary complaint is pain in the center of chest radiating in to the neck and center of back.  Pt says she was awakened from her sleep by the pain tonight Pt very anxious and complaining of pain in left shoulder from several months ago.

## 2011-04-27 NOTE — ED Provider Notes (Signed)
History     CSN: [id number] Arrival date & time: 04/27/2011  2:58 AM  Chief Complaint  Patient presents with  . Chest Pain   HPI Comments: Seen 0301.Patient with s/o Stage 2 breast CA 9 years ago having finished chemo and radiation,  Presents  with mid chest pain that goes up to her neck and radiates  to the right breast. Denies,fever, chills, cough, shortness of breath. She was unable to alleviate the pain at home.   Patient is a 76 y.o. female presenting with chest pain. The history is provided by the patient.  Chest Pain The chest pain began less than 1 hour ago (Awakened with buring sensation in mid chest doing up to her  neck. Radiated to the right breath with numbness and tingling.). Chest pain occurs frequently. The chest pain is resolved. The pain is associated with breathing. At its most intense, the pain is at 7/10. The pain is currently at 6/10. The quality of the pain is described as aching and burning. Radiates to: radiates to right sternal border. Chest pain is worsened by deep breathing and certain positions (palpation). Pertinent negatives for primary symptoms include no fever, no fatigue, no syncope, no cough, no wheezing, no palpitations, no abdominal pain, no nausea, no vomiting and no dizziness.     Past Medical History  Diagnosis Date  . History of chest pain     MI ruled out  . Non-occlusive coronary artery disease 2003    by Catheterization  . History of hiatal hernia   . History of esophageal spasm   . Irritable bowel syndrome   . Breast cancer 2003  . Syncope, non cardiac   . Osteoarthritis   . Diverticulitis     History  . HTN (hypertension)   . Hypokalemia     Past Surgical History  Procedure Date  . Thyroidectomy     Hypothyroidism status post  . Cholecystectomy 1997    Status post -stones  . Laparoscopic nissen fundoplication 1997  . Arm fx     left  . Breast lumpectomy     Left  . Knee surgery   . Kidney stone surgery 1992  . Mandible  reconstruction     History reviewed. No pertinent family history.  History  Substance Use Topics  . Smoking status: Never Smoker   . Smokeless tobacco: Not on file  . Alcohol Use: Yes     occasionally    OB History    Grav Para Term Preterm Abortions TAB SAB Ect Mult Living                  Review of Systems  Constitutional: Negative for fever and fatigue.  Respiratory: Negative for cough and wheezing.   Cardiovascular: Positive for chest pain. Negative for palpitations and syncope.  Gastrointestinal: Negative for nausea, vomiting and abdominal pain.  Neurological: Negative for dizziness.  All other systems reviewed and are negative.    Physical Exam  There were no vitals taken for this visit.  Physical Exam  Nursing note and vitals reviewed. Constitutional: She is oriented to person, place, and time. She appears well-developed and well-nourished.  HENT:  Head: Normocephalic.  Right Ear: External ear normal.  Left Ear: External ear normal.  Mouth/Throat: Oropharynx is clear and moist.  Eyes: EOM are normal.  Neck: Normal range of motion. Neck supple. No JVD present. No thyromegaly present.  Cardiovascular: Normal rate, normal heart sounds and intact distal pulses.   Pulmonary/Chest: Effort normal and  breath sounds normal.  Abdominal: Soft. Bowel sounds are normal.  Musculoskeletal: Normal range of motion.       Left shoulder discomfort with movement. (Under evaluation by Dr. Terrilee Croak for rotator cuff symptoms.  Neurological: She is alert and oriented to person, place, and time. She has normal reflexes.  Skin: Skin is warm and dry.    ED Course  Procedures Results for orders placed during the hospital encounter of 04/27/11  CBC      Component Value Range   WBC 7.0  4.0 - 10.5 (K/uL)   RBC 3.90  3.87 - 5.11 (MIL/uL)   Hemoglobin 11.5 (*) 12.0 - 15.0 (g/dL)   HCT 91.4 (*) 78.2 - 46.0 (%)   MCV 89.0  78.0 - 100.0 (fL)   MCH 29.5  26.0 - 34.0 (pg)   MCHC 33.1   30.0 - 36.0 (g/dL)   RDW 95.6  21.3 - 08.6 (%)   Platelets 220  150 - 400 (K/uL)  BASIC METABOLIC PANEL      Component Value Range   Sodium 138  135 - 145 (mEq/L)   Potassium 3.9  3.5 - 5.1 (mEq/L)   Chloride 106  96 - 112 (mEq/L)   CO2 24  19 - 32 (mEq/L)   Glucose, Bld 100 (*) 70 - 99 (mg/dL)   BUN 21  6 - 23 (mg/dL)   Creatinine, Ser 5.78  0.50 - 1.10 (mg/dL)   Calcium 46.9  8.4 - 10.5 (mg/dL)   GFR calc non Af Amer >60  >60 (mL/min)   GFR calc Af Amer >60  >60 (mL/min)  CARDIAC PANEL(CRET KIN+CKTOT+MB+TROPI)      Component Value Range   Total CK 91  7 - 177 (U/L)   CK, MB 3.0  0.3 - 4.0 (ng/mL)   Troponin I <0.30  <0.30 (ng/mL)   Relative Index RELATIVE INDEX IS INVALID  0.0 - 2.5    Dg Chest Portable 1 View  04/27/2011  *RADIOLOGY REPORT*  Clinical Data: Chest pain.  PORTABLE CHEST - 1 VIEW  Comparison: Chest radiograph performed 12/27/2009  Findings: The lungs are well-aerated.  Mild left basilar atelectasis or scarring is again noted.  There is no evidence of pleural effusion or pneumothorax.  The cardiomediastinal silhouette is borderline normal in size; calcification is noted within the aortic arch.  No acute osseous abnormalities are seen.  IMPRESSION: Mild left basilar atelectasis or scarring noted; lungs otherwise clear.  Original Report Authenticated By: Tonia Ghent, M.D.   MDM   Date: 04/27/2011  0301  Rate:77  Rhythm: normal sinus rhythm  QRS Axis: normal  Intervals: normal  ST/T Wave abnormalities: normal  Conduction Disutrbances:none  Narrative Interpretation:   Old EKG Reviewed: unchanged  Patient with h/o breast cancer s/p chemo and radiation here with left sided chest pain and profound muscle weakness. She has been under investigation  for neurological disorder. She has sore left shoulder c/w bursitis, torn rotator cuff, frozen shoulder. Labs are unremarkable. EKG is unchanged. Chest xray is normal. Reviewed results with patient and her husband. Pt feels  improved after observation and/or treatment in ED. Patient  understand and agree with initial ED impression and plan with expectations set for ED visit.Pt stable in ED with no significant deterioration in condition. MDM Reviewed: previous chart, nursing note and vitals Reviewed previous: labs and ECG Interpretation: labs and ECG     Nicoletta Dress. Colon Branch, MD 04/27/11 [phone]

## 2011-04-27 NOTE — ED Notes (Signed)
Pt c/o pain to left arm that radiates to center of chest and moves to right chest under breast and around to the back; pt states the pain woke her up

## 2011-05-12 ENCOUNTER — Other Ambulatory Visit: Payer: Self-pay | Admitting: Orthopedic Surgery

## 2011-05-12 DIAGNOSIS — M25512 Pain in left shoulder: Secondary | ICD-10-CM

## 2011-05-15 ENCOUNTER — Encounter (HOSPITAL_COMMUNITY): Payer: Medicare Other | Attending: Oncology

## 2011-05-15 DIAGNOSIS — E039 Hypothyroidism, unspecified: Secondary | ICD-10-CM | POA: Insufficient documentation

## 2011-05-15 DIAGNOSIS — Z853 Personal history of malignant neoplasm of breast: Secondary | ICD-10-CM | POA: Insufficient documentation

## 2011-05-15 DIAGNOSIS — Z09 Encounter for follow-up examination after completed treatment for conditions other than malignant neoplasm: Secondary | ICD-10-CM | POA: Insufficient documentation

## 2011-05-15 DIAGNOSIS — Z79899 Other long term (current) drug therapy: Secondary | ICD-10-CM | POA: Insufficient documentation

## 2011-05-15 DIAGNOSIS — C50919 Malignant neoplasm of unspecified site of unspecified female breast: Secondary | ICD-10-CM

## 2011-05-15 LAB — COMPREHENSIVE METABOLIC PANEL
AST: 14 U/L (ref 0–37)
CO2: 27 mEq/L (ref 19–32)
Calcium: 10.5 mg/dL (ref 8.4–10.5)
Creatinine, Ser: 0.75 mg/dL (ref 0.50–1.10)
GFR calc Af Amer: >60 mL/min (ref >60)
GFR calc non Af Amer: >60 mL/min (ref >60)
Glucose, Bld: 88 mg/dL (ref 70–99)
Total Protein: 6.1 g/dL (ref 6.0–8.3)

## 2011-05-15 LAB — DIFFERENTIAL
Basophils Absolute: 0 10*3/uL (ref 0.0–0.1)
Lymphocytes Relative: 23 % (ref 12–46)
Lymphs Abs: 2.1 10*3/uL (ref 0.7–4.0)
Neutro Abs: 6.1 10*3/uL (ref 1.7–7.7)

## 2011-05-15 LAB — CBC
MCH: 29.4 pg (ref 26.0–34.0)
MCHC: 33 g/dL (ref 30.0–36.0)
MCV: 89.1 fL (ref 78.0–100.0)
Platelets: 276 10*3/uL (ref 150–400)
RBC: 3.95 MIL/uL (ref 3.87–5.11)

## 2011-05-15 NOTE — Progress Notes (Signed)
Labs drawn today for cbc,diff,cmp 

## 2011-05-19 ENCOUNTER — Ambulatory Visit
Admission: RE | Admit: 2011-05-19 | Discharge: 2011-05-19 | Disposition: A | Discharge status: Home / Self Care | Payer: Medicare Other | Source: Ambulatory Visit | Attending: Orthopedic Surgery | Admitting: Orthopedic Surgery

## 2011-05-19 DIAGNOSIS — M25512 Pain in left shoulder: Secondary | ICD-10-CM

## 2011-05-24 LAB — COMPREHENSIVE METABOLIC PANEL
CO2: 25
Calcium: 9.8
Chloride: 106
GFR calc Af Amer: >60
GFR calc non Af Amer: >60
Potassium: 3.6
Sodium: 139
Total Protein: 7.1

## 2011-05-29 ENCOUNTER — Other Ambulatory Visit (HOSPITAL_COMMUNITY): Payer: Self-pay | Admitting: Oncology

## 2011-05-29 DIAGNOSIS — Z139 Encounter for screening, unspecified: Secondary | ICD-10-CM

## 2011-05-29 LAB — CBC
HCT: 35 — ABNORMAL LOW
HCT: 38
MCV: 87.1
MCV: 87.3
Platelets: 240
RBC: 4.36
RDW: 14.5
WBC: 6.3

## 2011-05-29 LAB — LIPID PANEL
Cholesterol: 231 — ABNORMAL HIGH
HDL: 41
LDL Cholesterol: 156 — ABNORMAL HIGH
Total CHOL/HDL Ratio: 5.6

## 2011-05-29 LAB — POCT I-STAT, CHEM 8
Creatinine, Ser: 0.7
Glucose, Bld: 92
Hemoglobin: 13.3
Potassium: 4.4

## 2011-05-29 LAB — CARDIAC PANEL(CRET KIN+CKTOT+MB+TROPI)
Troponin I: 0.01
Troponin I: 0.01

## 2011-05-29 LAB — COMPREHENSIVE METABOLIC PANEL
ALT: 13
Alkaline Phosphatase: 40
CO2: 27
GFR calc non Af Amer: >60
Glucose, Bld: 92
Potassium: 3.7
Sodium: 141

## 2011-05-29 LAB — POCT CARDIAC MARKERS
CKMB, poc: <1 — ABNORMAL LOW
Troponin i, poc: <0.05

## 2011-05-29 LAB — CK TOTAL AND CKMB (NOT AT ARMC)
CK, MB: 1.1
Total CK: 59

## 2011-06-01 LAB — CBC
Hemoglobin: 11.8 — ABNORMAL LOW
MCHC: 33.7
RDW: 13.7

## 2011-06-01 LAB — DIFFERENTIAL
Basophils Absolute: 0
Basophils Relative: 1
Eosinophils Absolute: 0.1
Monocytes Absolute: 0.4
Neutro Abs: 2.6
Neutrophils Relative %: 58

## 2011-06-03 ENCOUNTER — Emergency Department (HOSPITAL_COMMUNITY): Payer: Medicare Other

## 2011-06-03 ENCOUNTER — Emergency Department (HOSPITAL_COMMUNITY)
Admission: EM | Admit: 2011-06-03 | Discharge: 2011-06-03 | Disposition: A | Discharge status: Other Acute Inpatient Hospital | Payer: Medicare Other | Source: Home / Self Care | Attending: Emergency Medicine | Admitting: Emergency Medicine

## 2011-06-03 ENCOUNTER — Encounter (HOSPITAL_COMMUNITY): Payer: Self-pay | Admitting: *Deleted

## 2011-06-03 ENCOUNTER — Observation Stay (HOSPITAL_COMMUNITY)
Admission: EM | Admit: 2011-06-03 | Discharge: 2011-06-04 | Disposition: A | Discharge status: Home / Self Care | Payer: Medicare Other | Attending: Urology | Admitting: Urology

## 2011-06-03 DIAGNOSIS — K589 Irritable bowel syndrome without diarrhea: Secondary | ICD-10-CM | POA: Insufficient documentation

## 2011-06-03 DIAGNOSIS — N200 Calculus of kidney: Secondary | ICD-10-CM | POA: Insufficient documentation

## 2011-06-03 DIAGNOSIS — Z79899 Other long term (current) drug therapy: Secondary | ICD-10-CM | POA: Insufficient documentation

## 2011-06-03 DIAGNOSIS — K219 Gastro-esophageal reflux disease without esophagitis: Secondary | ICD-10-CM | POA: Insufficient documentation

## 2011-06-03 DIAGNOSIS — I10 Essential (primary) hypertension: Secondary | ICD-10-CM | POA: Insufficient documentation

## 2011-06-03 DIAGNOSIS — K449 Diaphragmatic hernia without obstruction or gangrene: Secondary | ICD-10-CM | POA: Insufficient documentation

## 2011-06-03 DIAGNOSIS — M1990 Unspecified osteoarthritis, unspecified site: Secondary | ICD-10-CM | POA: Insufficient documentation

## 2011-06-03 DIAGNOSIS — N12 Tubulo-interstitial nephritis, not specified as acute or chronic: Secondary | ICD-10-CM | POA: Insufficient documentation

## 2011-06-03 DIAGNOSIS — I2510 Atherosclerotic heart disease of native coronary artery without angina pectoris: Secondary | ICD-10-CM | POA: Insufficient documentation

## 2011-06-03 DIAGNOSIS — N1330 Unspecified hydronephrosis: Secondary | ICD-10-CM | POA: Insufficient documentation

## 2011-06-03 DIAGNOSIS — Z853 Personal history of malignant neoplasm of breast: Secondary | ICD-10-CM | POA: Insufficient documentation

## 2011-06-03 DIAGNOSIS — R110 Nausea: Secondary | ICD-10-CM | POA: Insufficient documentation

## 2011-06-03 DIAGNOSIS — R109 Unspecified abdominal pain: Secondary | ICD-10-CM | POA: Insufficient documentation

## 2011-06-03 DIAGNOSIS — K5730 Diverticulosis of large intestine without perforation or abscess without bleeding: Secondary | ICD-10-CM | POA: Insufficient documentation

## 2011-06-03 DIAGNOSIS — N201 Calculus of ureter: Principal | ICD-10-CM | POA: Insufficient documentation

## 2011-06-03 LAB — URINALYSIS, ROUTINE W REFLEX MICROSCOPIC
Ketones, ur: NEGATIVE mg/dL
Nitrite: NEGATIVE
Protein, ur: 30 mg/dL — AB
Urobilinogen, UA: 0.2 mg/dL (ref 0.0–1.0)
pH: 5.5 (ref 5.0–8.0)

## 2011-06-03 LAB — HEPATIC FUNCTION PANEL
AST: 26 U/L (ref 0–37)
Albumin: 3.4 g/dL — ABNORMAL LOW (ref 3.5–5.2)
Total Protein: 6.9 g/dL (ref 6.0–8.3)

## 2011-06-03 LAB — CBC
HCT: 36.2 % (ref 36.0–46.0)
MCHC: 32 g/dL (ref 30.0–36.0)
Platelets: 202 10*3/uL (ref 150–400)
RDW: 14.2 % (ref 11.5–15.5)
WBC: 9.3 10*3/uL (ref 4.0–10.5)

## 2011-06-03 LAB — COMPREHENSIVE METABOLIC PANEL
ALT: 18 U/L (ref 0–35)
Alkaline Phosphatase: 48 U/L (ref 39–117)
BUN: 27 mg/dL — ABNORMAL HIGH (ref 6–23)
CO2: 23 mEq/L (ref 19–32)
Chloride: 107 mEq/L (ref 96–112)
GFR calc Af Amer: 56 mL/min — ABNORMAL LOW (ref >60)
Glucose, Bld: 127 mg/dL — ABNORMAL HIGH (ref 70–99)
Potassium: 3.7 mEq/L (ref 3.5–5.1)
Sodium: 138 mEq/L (ref 135–145)
Total Bilirubin: 0.2 mg/dL — ABNORMAL LOW (ref 0.3–1.2)

## 2011-06-03 LAB — BASIC METABOLIC PANEL
Calcium: 10.5 mg/dL (ref 8.4–10.5)
Chloride: 107 mEq/L (ref 96–112)
Creatinine, Ser: 1.11 mg/dL — ABNORMAL HIGH (ref 0.50–1.10)
GFR calc Af Amer: 58 mL/min — ABNORMAL LOW (ref >60)
GFR calc non Af Amer: 48 mL/min — ABNORMAL LOW (ref >60)

## 2011-06-03 LAB — SURGICAL PCR SCREEN: MRSA, PCR: NEGATIVE

## 2011-06-03 MED ORDER — DIPHENHYDRAMINE HCL 25 MG PO CAPS
ORAL_CAPSULE | ORAL | Status: AC
  Administered 2011-06-03: 07:00:00
  Filled 2011-06-03: qty 1

## 2011-06-03 MED ORDER — HYDROMORPHONE HCL 1 MG/ML IJ SOLN
1.0000 mg | Freq: Once | INTRAMUSCULAR | Status: AC
  Administered 2011-06-03: 1 mg via INTRAVENOUS
  Filled 2011-06-03: qty 1

## 2011-06-03 MED ORDER — CIPROFLOXACIN HCL 250 MG PO TABS
500.0000 mg | ORAL_TABLET | Freq: Once | ORAL | Status: AC
  Administered 2011-06-03: 500 mg via ORAL
  Filled 2011-06-03 (×2): qty 1

## 2011-06-03 MED ORDER — KETOROLAC TROMETHAMINE 30 MG/ML IJ SOLN
30.0000 mg | Freq: Once | INTRAMUSCULAR | Status: AC
  Administered 2011-06-03: 30 mg via INTRAVENOUS
  Filled 2011-06-03: qty 1

## 2011-06-03 MED ORDER — ONDANSETRON HCL 4 MG/2ML IJ SOLN
4.0000 mg | Freq: Once | INTRAMUSCULAR | Status: AC
  Administered 2011-06-03: 4 mg via INTRAVENOUS
  Filled 2011-06-03: qty 2

## 2011-06-03 MED ORDER — PANTOPRAZOLE SODIUM 40 MG IV SOLR
40.0000 mg | Freq: Once | INTRAVENOUS | Status: AC
  Administered 2011-06-03: 40 mg via INTRAVENOUS
  Filled 2011-06-03: qty 40

## 2011-06-03 NOTE — ED Notes (Signed)
Report given to kelly, [facility]

## 2011-06-03 NOTE — ED Notes (Signed)
Report given to Rachel, RN.

## 2011-06-03 NOTE — ED Notes (Signed)
RLQ pain , onset 8 pm.  Nausea, no vomiting.

## 2011-06-03 NOTE — ED Provider Notes (Signed)
History     CSN: [id number] Arrival date & time: 06/03/2011  2:16 AM  Chief Complaint  Patient presents with  . Abdominal Pain    (Consider location/radiation/quality/duration/timing/severity/associated sxs/prior treatment) HPI Comments: Seen 0223  Patient is a 76 y.o. female presenting with abdominal pain. The history is provided by the patient.  Abdominal Pain The primary symptoms of the illness include abdominal pain and nausea. The current episode started 3 to 5 hours ago (Pain began in RLQ and radaites to right flank. Got a bit better and patient went to bed. Was awakened at 2 AM with recurrent sharp pain that went from RLQ to right flank.). The onset of the illness was sudden. The problem has been rapidly worsening.  Associated symptoms comments: Flank pain, nausea.    Past Medical History  Diagnosis Date  . History of chest pain     MI ruled out  . Non-occlusive coronary artery disease 2003    by Catheterization  . History of hiatal hernia   . History of esophageal spasm   . Irritable bowel syndrome   . Breast cancer 2003  . Syncope, non cardiac   . Osteoarthritis   . Diverticulitis     History  . HTN (hypertension)   . Hypokalemia     Past Surgical History  Procedure Date  . Thyroidectomy     Hypothyroidism status post  . Cholecystectomy 1997    Status post -stones  . Laparoscopic nissen fundoplication 1997  . Arm fx     left  . Breast lumpectomy     Left  . Knee surgery   . Kidney stone surgery 1992  . Mandible reconstruction   . Cataract extraction     No family history on file.  History  Substance Use Topics  . Smoking status: Never Smoker   . Smokeless tobacco: Not on file  . Alcohol Use: Yes     occasionally    OB History    Grav Para Term Preterm Abortions TAB SAB Ect Mult Living                  Review of Systems  Gastrointestinal: Positive for nausea and abdominal pain.  Genitourinary: Positive for flank pain.  All other systems  reviewed and are negative.    Allergies  Ciprofloxacin and Oxycodone  Home Medications   Current Outpatient Rx  Name Route Sig Dispense Refill  . AMLODIPINE BESY-BENAZEPRIL HCL 5-20 MG PO CAPS Oral Take 1 capsule by mouth daily.      Marland Kitchen ESOMEPRAZOLE MAGNESIUM 40 MG PO CPDR Oral Take 40 mg by mouth daily before breakfast.      . IBUPROFEN 200 MG PO TABS Oral Take 200 mg by mouth every 6 (six) hours as needed.      Marland Kitchen LEVOTHYROXINE SODIUM 100 MCG PO TABS Oral Take 100 mcg by mouth daily.      Marland Kitchen POTASSIUM CHLORIDE 20 MEQ PO PACK Oral Take 20 mEq by mouth 2 (two) times daily.        BP 154/69  Pulse 69  Temp 98.4 F (36.9 C)  Resp 20  Wt 205 lb (92.987 kg)  SpO2 99%  Physical Exam  Nursing note and vitals reviewed. Constitutional: She is oriented to person, place, and time. She appears well-developed and well-nourished. She appears distressed.  HENT:  Head: Normocephalic and atraumatic.  Eyes: EOM are normal.  Neck: Normal range of motion.  Cardiovascular: Normal rate, normal heart sounds and intact distal pulses.  Pulmonary/Chest: Effort normal and breath sounds normal.  Abdominal: Soft. Bowel sounds are normal. There is no tenderness. There is no rebound and no guarding.  Genitourinary:       No cva tenderness to percussion  Musculoskeletal: Normal range of motion.  Neurological: She is alert and oriented to person, place, and time.  Skin: Skin is warm and dry.    ED Course  CRITICAL CARE Performed by: Annamarie Dawley Authorized by: Annamarie Dawley Total critical care time: 55 minutes Critical care was necessary to treat or prevent imminent or life-threatening deterioration of the following conditions: renal failure. Critical care was time spent personally by me on the following activities: development of treatment plan with patient or surrogate, discussions with consultants, evaluation of patient's response to treatment, examination of patient, obtaining history from  patient or surrogate, ordering and performing treatments and interventions, ordering and review of laboratory studies, ordering and review of radiographic studies, re-evaluation of patient's condition and review of old charts.   (including critical care time)  Results for orders placed during the hospital encounter of 06/03/11  BASIC METABOLIC PANEL      Component Value Range   Sodium 140  135 - 145 (mEq/L)   Potassium 3.8  3.5 - 5.1 (mEq/L)   Chloride 107  96 - 112 (mEq/L)   CO2 23  19 - 32 (mEq/L)   Glucose, Bld 95  70 - 99 (mg/dL)   BUN 26 (*) 6 - 23 (mg/dL)   Creatinine, Ser 4.09 (*) 0.50 - 1.10 (mg/dL)   Calcium 81.1  8.4 - 10.5 (mg/dL)   GFR calc non Af Amer 48 (*) >60 (mL/min)   GFR calc Af Amer 58 (*) >60 (mL/min)  URINALYSIS, ROUTINE W REFLEX MICROSCOPIC      Component Value Range   Color, Urine YELLOW  YELLOW    Appearance CLOUDY (*) CLEAR    Specific Gravity, Urine 1.025  1.005 - 1.030    pH 5.5  5.0 - 8.0    Glucose, UA NEGATIVE  NEGATIVE (mg/dL)   Hgb urine dipstick LARGE (*) NEGATIVE    Bilirubin Urine NEGATIVE  NEGATIVE    Ketones, ur NEGATIVE  NEGATIVE (mg/dL)   Protein, ur 30 (*) NEGATIVE (mg/dL)   Urobilinogen, UA 0.2  0.0 - 1.0 (mg/dL)   Nitrite NEGATIVE  NEGATIVE    Leukocytes, UA MODERATE (*) NEGATIVE   URINE MICROSCOPIC-ADD ON      Component Value Range   Squamous Epithelial / LPF FEW (*) RARE    WBC, UA 11-20  <3 (WBC/hpf)   RBC / HPF 21-50  <3 (RBC/hpf)   Bacteria, UA MANY (*) RARE    Ct Abdomen Pelvis Wo Contrast  06/03/2011  *RADIOLOGY REPORT*  Clinical Data: Right lower quadrant pain and nausea.  History of kidney stones.  CT ABDOMEN AND PELVIS WITHOUT CONTRAST  Technique:  Multidetector CT imaging of the abdomen and pelvis was performed following the standard protocol without intravenous contrast.  Comparison: 11/21/2009  Findings: Lung bases are clear.  There is a 10 mm stone in the right ureteropelvic junction with proximal right pyelocaliectasis  and mild periureteral stranding consistent with moderately obstructing stone.  An additional nonobstructing stone is present in the mid pole of the right kidney measuring about 3 mm diameter. No left renal or ureteral stones are suggested.  Calcification in the left renal hilum is stable since the prior study and probably represents vascular calcification.  Parapelvic cysts on the left. The bladder is  decompressed and cannot be well evaluated although no bladder stones are suggested.  Low attenuation lesion in the upper pole of the right kidney measures 3 cm diameter and likely represents a cyst.  This is stable since the prior study.  Moderate sized esophageal hiatal hernia.  Surgical clips in the EG junction region.  3 cm circumscribed low attenuation lesion in the left lobe of the liver probably represents a cyst.  Surgical absence of the gallbladder.  Spleen size is normal.  No adrenal gland nodules.  Unenhanced pancreas is unremarkable.  There appears to be a duodenal diverticulum in the third portion of the duodenum. Small bowel are decompressed.  Small umbilical hernia containing fat.  No free fluid or free air in the abdomen.  Normal caliber abdominal aorta with calcification.  No retroperitoneal lymphadenopathy.  Pelvis:  No free or loculated pelvic fluid collections.  The appendix is not visualized but no inflammatory changes are demonstrated in the right lower quadrant.  Scattered diverticula in the colon without inflammatory change.  Degenerative changes in the lumbar spine.  Spondylolysis with mild spondylolisthesis at L5-S1.  IMPRESSION: 10 mm mildly obstructing stone in the right ureteropelvic junction. Nonobstructing right intrarenal stone at 3 mm diameter.  Parapelvic cysts in the left kidney.  Esophageal hiatal hernia.  Small umbilical hernia.  Spondylolysis and mild spondylolisthesis at L5- S1.  Original Report Authenticated By: Marlon Pel, M.D.   Patient with onset of right sided pain  similar to previous kidney stone. RLQ with radiation to right flank. UA with RBCs, wbcs. CT scan positive for 10 mm mildly obstructing stone on the right. Poor pain management with 2 doses of hydromorphone and 2 doses of zofran. BMET with elevation in creatinine from 0.78 of 9/10 12 to 1.11 currently. IVF infusing. Was given ciprofloxacin to which she states she has had a rash in the past.She was given benadryl in conjunction with the ciprofloxacin. Belot.Conrad Spoke with Dr. Patsi Sears, urologist. He accepted patient in transfer to [facility] to transport. Asked that patient go to the ER and he be called. [id number] Spoke with Dr. Saralyn Pilar, EDP at [facility]. She was made aware of patient coming for Dr. Patsi Sears to see.  MDM Reviewed: nursing note and vitals Total time providing critical care: 31. Consults: urology (EDP at [facility])      Nicoletta Dress. Colon Branch, MD 06/03/11 0630

## 2011-06-04 HISTORY — PX: OTHER SURGICAL HISTORY: SHX169

## 2011-06-04 LAB — URINE CULTURE
Culture: NO GROWTH
Special Requests: NEGATIVE

## 2011-06-09 NOTE — Discharge Summary (Signed)
  [name], [name] NO.:  [id number]  MEDICAL RECORD NO.:  [id number]  LOCATION:  1404                         FACILITY:  [facility]  PHYSICIAN:  Ailish Prospero I. Patsi Sears, M.D.DATE OF BIRTH:  1935/05/01  DATE OF ADMISSION:  06/03/2011 DATE OF DISCHARGE:  06/04/2011                              DISCHARGE SUMMARY   FINAL DIAGNOSES: 1. Right ureteropelvic junction stone, 7 mm. 2. Right pyelonephritis. 3. History of left breast cancer. 4. Hypertension. 5. Right lower pole nephrolithiasis.  OPERATION:  This admission took place on 09/29.  The operation is cystoscopy, right retrograde pyelogram and right double-J stent.  HISTORY OF PRESENT ILLNESS:  [name] is a 76 year old female transferred from [facility] in [place] with a multi-day history of right flank pain, nausea and vomiting.  CT scan showed a 7 mm right UPJ stone, with extravasation of fluid and hydronephrosis.  The patient was felt to have pyelonephritis and obstruction.  She was transferred to [facility] for stenting.  PAST MEDICAL HISTORY: 1. Left breast cancer with lumpectomy and radiation, and tamoxifen     (now off). 2. Hypertension. 3. Irritable bowel syndrome. 4. GERD. 5. Liver cyst - benign. 6. Diverticulitis. 7. Small right nephrolithiasis (2005).  REVIEW OF SYSTEMS:  Significant for right lower quadrant abdominal pain, headache, flank pain on the right side.  No hematuria.  No fever, no chills.  MEDICATIONS:  Include Lotrel, Nexium, and Synthroid.  ALLERGIES: 1. CIPRO. 2. OXYCODONE.  SOCIAL HISTORY:  Tobacco and alcohol none.  PHYSICAL EXAM:  VITAL SIGNS:  Temperature 98.1, blood pressure 109/56, pulse of 70, respiratory rate 18. NECK:  Supple, nontender. CHEST:  Clear to P and A. ABDOMEN/PELVIS:  Soft, decrease in bowel sounds with right CVA pain and right lower quadrant pain to palpation.  BUS normal.  EXTREMITIES:  No cyanosis.  No edema.  HOSPITAL  COURSE:  The patient was taken to the operating room where right double-J stent was placed after a retrograde pyelogram was performed.  It was noted that the right UPJ stone was very poorly identified.  The patient will probably need future CT scan to evaluate for possible uric acid stone.  She may need 24-hour urine as well.  She was allowed to be discharged on trimethoprim, Ditropan for spasm, and Percocet for pain.  She will call the office at [phone] for follow up, and scheduling of lithotripsy, and future CT.     Jalayna Josten I. Patsi Sears, M.D.     SIT/MEDQ  D:  06/04/2011  T:  06/04/2011  Job:  [id number]  Electronically Signed by Jethro Bolus M.D. on 06/09/2011 12:53:32 PM

## 2011-06-09 NOTE — H&P (Signed)
  NAMERYENN, HOWETH NO.:  [id number]  MEDICAL RECORD NO.:  [id number]  LOCATION:  WLED                         FACILITY:  [facility]  PHYSICIAN:  Earley Grobe I. Patsi Sears, M.D.DATE OF BIRTH:  1934-12-29  DATE OF ADMISSION:  06/03/2011 DATE OF DISCHARGE:                             HISTORY & PHYSICAL   HISTORY:  Mrs. Mcglaughlin is a 76 year old married white female from [place], [place], transferred from [facility] with a several-day history of right flank pain, right lower quadrant pain, now with nausea and vomiting.  The patient has no gross hematuria.  She does have a history of right ureteral stone surgery in [place] in 2005.  She also has a history of small right renal calculus of no significance in 2005.  She has seen Dr. Vernie Ammons in the past.  PAST MEDICAL HISTORY:  Significant for: 1. Left breast cancer with post lumpectomy, radiation, and 5 years of     tamoxifen.2. Hypertension. 3. Irritable bowel syndrome. 4. GERD. 5. Liver cyst, benign. 6. Diverticulitis. 7. Small right nephrolithiasis (2005). 8. Osteoarthritis. 9. Syncope.  CONSTITUTIONAL REVIEW OF SYSTEMS:  Significant for right flank pain, right lower quadrant pain.  Note, she does have nausea and vomiting this morning.  No gross hematuria.  No fever, no chills.  Remaining constitutional review of systems is noncontributory.  CURRENT MEDICATIONS:  Include Lotrel, Nexium, Synthroid (unknown dosages).  ALLERGIES: 1. CIPRO. 2. OXYCODONE.  TOBACCO AND ALCOHOL:  None.  FAMILY HISTORY:  The patient is married and lives with her husband in [place].  She does have a family history of kidney stones.  ADMISSION PHYSICAL EXAM:  VITAL SIGNS:  Shows temperature 98.1, blood pressure 109/56, pulse of 70, respiratory rate 18. NECK:  Supple, nontender, no nodes. CHEST:  Clear to P and A. ABDOMEN:  Soft, decreased bowel sounds.  There is right CVA pain and right lower quadrant  pain to palpation.  BUS is normal.  EXTREMITIES: No cyanosis, no edema.  Review of x-rays show 1-cm right ureteropelvic junction stone, with a 3- mm right lower pole stone.  There is stranding in the right kidney.  ASSESSMENT:  Large right ureteropelvic junction stone, with proximal hydronephrosis and renal stranding.  The patient needs to have double-J stent and will eventually need lithotripsy.  She will be admitted for 23 hours for antibiotic treatment.  PLAN: 1. Admit 23-hour observation. 2. Double-J stent today. 3. Rocephin 1 gram. 4. Zofran and Dilaudid for pain.     Chavis Tessler I. Patsi Sears, M.D.     SIT/MEDQ  D:  06/03/2011  T:  06/03/2011  Job:  [id number]  Electronically Signed by Jethro Bolus M.D. on 06/09/2011 12:53:24 PM

## 2011-06-09 NOTE — Op Note (Signed)
  NAMETASHIRA, TORRE NO.:  [id number]  MEDICAL RECORD NO.:  [id number]  LOCATION:  1404                         FACILITY:  [facility]  PHYSICIAN:  Deetta Siegmann I. Patsi Sears, M.D.DATE OF BIRTH:  Sep 27, 1934  DATE OF PROCEDURE:  06/03/2011 DATE OF DISCHARGE:  06/04/2011                              OPERATIVE REPORT   PREOPERATIVE DIAGNOSES:  Right ureteropelvic junction stone with hydronephrosis, and pyelonephritis.  POSTOPERATIVE DIAGNOSES:  Right ureteropelvic junction stone with hydronephrosis, and pyelonephritis.  OPERATION:  Cystourethroscopy, right retrograde pyelogram with interpretation, right double-J stent (6 x 24 cm).  INDICATIONS FOR PROCEDURE:  The patient's history shows a 76 year old female from [place], [place], transferred from [facility] with a several-day history of right flank pain, nausea and vomiting.  No gross hematuria.  The patient had a CT scan at [facility], showing pyelonephritis, and a right UPJ stone measuring 7 mm without progression and with proximal hydronephrosis.  She is now here for cystoscopy and double-J stent.  PROCEDURE IN DETAIL:  After appropriate preanesthesia, the patient was brought to the operating room, placed on the operating room in the dorsal supine position where general LMA anesthesia was induced.  She was then placed in dorsal lithotomy position where the pubis was prepped with Betadine solution and draped in the usual fashion.  The right arm was marked with a T.  Cystourethroscopy was accomplished, and shows normal urethra and normal bladder base.  There is clear efflux from the left orifice, but I could not see any efflux from the right orifice.  Right retrograde pyelogram was performed which shows normal-appearing ureter, and abnormality at the right ureteropelvic junction.  However, I could not definitely see a stone at the right UPJ.  There is a stone measuring 3 mm in the  right lower pole.  There appears to be stranding around the kidney, consistent with pyelonephritis and hydronephrosis.  A guidewire was passed through the 6 open-ended catheter and coiled in the renal pelvis.  The open-ended catheter was removed, and a 6 x 24-cm double-J stent was placed without difficulty.  The bladder was drained of fluid.  The patient was awakened and taken to recovery room in good condition.  She received a B and O suppository.     Dotsie Gillette I. Patsi Sears, M.D.     SIT/MEDQ  D:  06/04/2011  T:  06/04/2011  Job:  [id number]  Electronically Signed by Jethro Bolus M.D. on 06/09/2011 12:53:28 PM

## 2011-06-20 LAB — DIFFERENTIAL
Basophils Relative: 0
Eosinophils Absolute: 0.1
Eosinophils Relative: 2
Lymphs Abs: 2
Monocytes Absolute: 0.5
Monocytes Relative: 8

## 2011-06-20 LAB — COMPREHENSIVE METABOLIC PANEL
ALT: 16
AST: 21
Alkaline Phosphatase: 52
CO2: 25
Calcium: 9
GFR calc Af Amer: >60
Glucose, Bld: 96
Potassium: 3.9
Sodium: 140
Total Protein: 6

## 2011-06-20 LAB — CANCER ANTIGEN 27.29: CA 27.29: 14

## 2011-06-20 LAB — CBC
Hemoglobin: 12.2
MCHC: 33.7
RBC: 4.14
RDW: 14.5 — ABNORMAL HIGH

## 2011-06-26 ENCOUNTER — Ambulatory Visit (HOSPITAL_COMMUNITY)
Admission: RE | Admit: 2011-06-26 | Discharge: 2011-06-26 | Disposition: A | Discharge status: Home / Self Care | Payer: Medicare Other | Source: Ambulatory Visit | Attending: Urology | Admitting: Urology

## 2011-06-26 DIAGNOSIS — K449 Diaphragmatic hernia without obstruction or gangrene: Secondary | ICD-10-CM | POA: Insufficient documentation

## 2011-06-26 DIAGNOSIS — I10 Essential (primary) hypertension: Secondary | ICD-10-CM | POA: Insufficient documentation

## 2011-06-26 DIAGNOSIS — C50919 Malignant neoplasm of unspecified site of unspecified female breast: Secondary | ICD-10-CM | POA: Insufficient documentation

## 2011-06-26 DIAGNOSIS — N200 Calculus of kidney: Secondary | ICD-10-CM | POA: Insufficient documentation

## 2011-07-06 HISTORY — PX: LITHOTRIPSY: SUR834

## 2011-07-11 ENCOUNTER — Ambulatory Visit (HOSPITAL_COMMUNITY)
Admission: RE | Admit: 2011-07-11 | Discharge: 2011-07-11 | Disposition: A | Discharge status: Home / Self Care | Payer: Medicare Other | Source: Ambulatory Visit | Attending: Oncology | Admitting: Oncology

## 2011-07-11 DIAGNOSIS — Z1231 Encounter for screening mammogram for malignant neoplasm of breast: Secondary | ICD-10-CM | POA: Insufficient documentation

## 2011-07-11 DIAGNOSIS — Z139 Encounter for screening, unspecified: Secondary | ICD-10-CM

## 2011-07-19 ENCOUNTER — Emergency Department (HOSPITAL_COMMUNITY)
Admission: EM | Admit: 2011-07-19 | Discharge: 2011-07-20 | Disposition: A | Discharge status: Home / Self Care | Payer: Medicare Other | Attending: Emergency Medicine | Admitting: Emergency Medicine

## 2011-07-19 ENCOUNTER — Encounter (HOSPITAL_COMMUNITY): Payer: Self-pay | Admitting: *Deleted

## 2011-07-19 DIAGNOSIS — T373X5A Adverse effect of other antiprotozoal drugs, initial encounter: Secondary | ICD-10-CM | POA: Insufficient documentation

## 2011-07-19 DIAGNOSIS — R6883 Chills (without fever): Secondary | ICD-10-CM | POA: Insufficient documentation

## 2011-07-19 DIAGNOSIS — K5732 Diverticulitis of large intestine without perforation or abscess without bleeding: Secondary | ICD-10-CM | POA: Insufficient documentation

## 2011-07-19 DIAGNOSIS — T887XXA Unspecified adverse effect of drug or medicament, initial encounter: Secondary | ICD-10-CM

## 2011-07-19 DIAGNOSIS — R112 Nausea with vomiting, unspecified: Secondary | ICD-10-CM | POA: Insufficient documentation

## 2011-07-19 DIAGNOSIS — T3795XA Adverse effect of unspecified systemic anti-infective and antiparasitic, initial encounter: Secondary | ICD-10-CM | POA: Insufficient documentation

## 2011-07-19 DIAGNOSIS — K5792 Diverticulitis of intestine, part unspecified, without perforation or abscess without bleeding: Secondary | ICD-10-CM

## 2011-07-19 DIAGNOSIS — N19 Unspecified kidney failure: Secondary | ICD-10-CM | POA: Insufficient documentation

## 2011-07-19 LAB — POCT I-STAT, CHEM 8
BUN: 17 mg/dL (ref 6–23)
Calcium, Ion: 1.32 mmol/L (ref 1.12–1.32)
Creatinine, Ser: 1.9 mg/dL — ABNORMAL HIGH (ref 0.50–1.10)
Glucose, Bld: 111 mg/dL — ABNORMAL HIGH (ref 70–99)
Sodium: 140 mEq/L (ref 135–145)
TCO2: 23 mmol/L (ref 0–100)

## 2011-07-19 MED ORDER — SODIUM CHLORIDE 0.9 % IV BOLUS (SEPSIS)
1000.0000 mL | INTRAVENOUS | Status: AC
  Administered 2011-07-19: 1000 mL via INTRAVENOUS

## 2011-07-19 MED ORDER — ONDANSETRON HCL 4 MG/2ML IJ SOLN
4.0000 mg | Freq: Once | INTRAMUSCULAR | Status: AC
  Administered 2011-07-19: 4 mg via INTRAVENOUS

## 2011-07-19 NOTE — ED Notes (Signed)
Pt reports having chills and nausea starting on 11/8 after starting cipro and flagyl.  States she believes she's having a reaction to antibiotics and wants to stop taking them.

## 2011-07-19 NOTE — ED Notes (Signed)
Pt reports that symptoms of nausea and hot flashes began after being started on cipro and flagyl.  Upon reviewing pt's medications, she does report an allergic reaction to cipro previously.  States that she did have a dose of IV cipro and instantly her arm became red and swollen.

## 2011-07-20 ENCOUNTER — Emergency Department (HOSPITAL_COMMUNITY): Payer: Medicare Other

## 2011-07-20 LAB — URINALYSIS, ROUTINE W REFLEX MICROSCOPIC
Bilirubin Urine: NEGATIVE
Glucose, UA: NEGATIVE mg/dL
Ketones, ur: NEGATIVE mg/dL
Urobilinogen, UA: 0.2 mg/dL (ref 0.0–1.0)

## 2011-07-20 LAB — URINE MICROSCOPIC-ADD ON

## 2011-07-20 MED ORDER — ONDANSETRON 4 MG PO TBDP: 4.0000 mg | ORAL_TABLET | Freq: Three times a day (TID) | ORAL | Status: AC | PRN

## 2011-07-20 MED ORDER — AMOXICILLIN-POT CLAVULANATE 875-125 MG PO TABS: 1.0000 | ORAL_TABLET | Freq: Two times a day (BID) | ORAL | Status: AC

## 2011-07-20 NOTE — ED Provider Notes (Signed)
History     CSN: [id number] Arrival date & time: 07/19/2011 10:25 PM   First MD Initiated Contact with Patient 07/19/11 2301      Chief Complaint  Patient presents with  . Nausea  . Chills    (Consider location/radiation/quality/duration/timing/severity/associated sxs/prior treatment) HPI Comments: Patient is a 76 year old female with a history of diverticulitis in the past. She started on ciprofloxacin and Flagyl treatment for diverticulitis approximately 5 days ago and since that time has had significant nausea, vomiting and a bad taste in her mouth. She states that she has had very minimal by mouth intake today though her feelings of diverticulitis have improved with no lower abdominal tenderness. She denies fevers states her temperature at home as been 99. Symptoms of the nausea and vomiting are constant, or center taking antibiotics, better with Zofran, not associated with diarrhea, fever, back pain, chest pain, cough or shortness of breath. Symptoms are moderate and persistent.  The history is provided by the patient and a relative.    Past Medical History  Diagnosis Date  . History of chest pain     MI ruled out  . Non-occlusive coronary artery disease 2003    by Catheterization  . History of hiatal hernia   . History of esophageal spasm   . Irritable bowel syndrome   . Breast cancer 2003  . Syncope, non cardiac   . Osteoarthritis   . Diverticulitis     History  . HTN (hypertension)   . Hypokalemia   . Kidney stones     Past Surgical History  Procedure Date  . Thyroidectomy     Hypothyroidism status post  . Cholecystectomy 1997    Status post -stones  . Laparoscopic nissen fundoplication 1997  . Arm fx     left  . Breast lumpectomy     Left  . Knee surgery   . Kidney stone surgery 1992  . Mandible reconstruction   . Cataract extraction     History reviewed. No pertinent family history.  History  Substance Use Topics  . Smoking status: Never Smoker    . Smokeless tobacco: Not on file  . Alcohol Use: Yes     occasionally    OB History    Grav Para Term Preterm Abortions TAB SAB Ect Mult Living                  Review of Systems  Constitutional: Negative for fever and chills.  HENT: Negative for congestion, sore throat, rhinorrhea and neck pain.   Eyes: Negative for visual disturbance.  Respiratory: Negative for cough and shortness of breath.   Cardiovascular: Negative for chest pain.  Gastrointestinal: Positive for nausea and vomiting. Negative for abdominal pain and diarrhea.  Genitourinary: Negative for dysuria and frequency.  Musculoskeletal: Negative for back pain.  Skin: Negative for rash.  Neurological: Negative for weakness, numbness and headaches.  Hematological: Negative for adenopathy.  Psychiatric/Behavioral: Negative for behavioral problems.    Allergies  Ciprofloxacin and Oxycodone  Home Medications   Current Outpatient Rx  Name Route Sig Dispense Refill  . ACETAMINOPHEN 500 MG PO TABS Oral Take 500 mg by mouth as needed. For headache     . AMLODIPINE BESY-BENAZEPRIL HCL 5-20 MG PO CAPS Oral Take 1 capsule by mouth daily.      Marland Kitchen CIPROFLOXACIN HCL 500 MG PO TABS Oral Take 500 mg by mouth 2 (two) times daily.      Marland Kitchen ESOMEPRAZOLE MAGNESIUM 40 MG PO CPDR Oral Take  40 mg by mouth daily before breakfast.      . LEVOTHYROXINE SODIUM 125 MCG PO TABS Oral Take 125 mcg by mouth every morning.      Marland Kitchen METRONIDAZOLE 500 MG PO TABS Oral Take 500 mg by mouth 3 (three) times daily.      Carma Leaven M PLUS PO TABS Oral Take 1 tablet by mouth daily.      Marland Kitchen ONDANSETRON 4 MG PO TBDP Oral Take 4 mg by mouth every 8 (eight) hours as needed. For nausea      . AMOXICILLIN-POT CLAVULANATE 875-125 MG PO TABS Oral Take 1 tablet by mouth every 12 (twelve) hours. 14 tablet 0  . IBUPROFEN 200 MG PO TABS Oral Take 200 mg by mouth every 6 (six) hours as needed.      Marland Kitchen ONDANSETRON 4 MG PO TBDP Oral Take 1 tablet (4 mg total) by mouth every 8  (eight) hours as needed for nausea. 10 tablet 0    BP 123/59  Pulse 69  Temp(Src) 98.1 F (36.7 C) (Oral)  Resp 18  Ht 5\' 6"  (1.676 m)  Wt 189 lb (85.73 kg)  BMI 30.51 kg/m2  SpO2 95%  Physical Exam  Nursing note and vitals reviewed. Constitutional: She appears well-developed and well-nourished. No distress.  HENT:  Head: Normocephalic and atraumatic.  Mouth/Throat: No oropharyngeal exudate.       His membranes mildly dehydrated  Eyes: Conjunctivae and EOM are normal. Pupils are equal, round, and reactive to light. Right eye exhibits no discharge. Left eye exhibits no discharge. No scleral icterus.  Neck: Normal range of motion. Neck supple. No JVD present. No thyromegaly present.  Cardiovascular: Normal rate, regular rhythm, normal heart sounds and intact distal pulses.  Exam reveals no gallop and no friction rub.   No murmur heard. Pulmonary/Chest: Effort normal and breath sounds normal. No respiratory distress. She has no wheezes. She has no rales.  Abdominal: Soft. Bowel sounds are normal. She exhibits no distension and no mass. There is no tenderness.       No tenderness to palpation of the abdomen  Musculoskeletal: Normal range of motion. She exhibits no edema and no tenderness.  Lymphadenopathy:    She has no cervical adenopathy.  Neurological: She is alert. Coordination normal.  Skin: Skin is warm and dry. No rash noted. No erythema.  Psychiatric: She has a normal mood and affect. Her behavior is normal.    ED Course  Procedures (including critical care time)  Labs Reviewed  URINALYSIS, ROUTINE W REFLEX MICROSCOPIC - Abnormal; Notable for the following:    Color, Urine AMBER (*) BIOCHEMICALS MAY BE AFFECTED BY COLOR   Specific Gravity, Urine >1.030 (*)    Hgb urine dipstick SMALL (*)    Protein, ur TRACE (*)    Leukocytes, UA TRACE (*)    All other components within normal limits  POCT I-STAT, CHEM 8 - Abnormal; Notable for the following:    Potassium 3.4 (*)      Creatinine, Ser 1.90 (*)    Glucose, Bld 111 (*)    All other components within normal limits  URINE MICROSCOPIC-ADD ON - Abnormal; Notable for the following:    Casts HYALINE CASTS (*)    All other components within normal limits  I-STAT, CHEM 8   Ct Abdomen Pelvis Wo Contrast  07/20/2011  *RADIOLOGY REPORT*  Clinical Data: 76 year old female with recent lithotripsy.  Acute renal failure, nausea.  CT ABDOMEN AND PELVIS WITHOUT CONTRAST  Technique:  Multidetector CT imaging of the abdomen and pelvis was performed following the standard protocol without intravenous contrast.  Comparison: 06/03/2011 and earlier.  Findings: Lung bases are clear.  Moderate sized gastric hiatal hernia.  No pericardial or pleural effusion.  Osteopenia. No acute osseous abnormality identified.  Lower lumbar facet degeneration.  No pelvic free fluid.  Negative rectum, uterus, and adnexa.  Sigmoid diverticulosis.  Extensive diverticula continue into the descending colon.  At the junction of the descending and sigmoid colon there is mild surrounding inflammatory stranding (series 2 image 69).  There is mild thickening of the left lateral coronal fascia.  The inflammatory changes abate in the more proximal descending colon.  No extraluminal gas or fluid.  Diverticulosis continues to the splenic flexure.  There is retained stool in the more proximal colon.  No dilated small bowel. Negative distal stomach and duodenum.  Benign hypodense lesion in the left hepatic dome is minimally increased since 2008.  Otherwise negative noncontrast liver, spleen and pancreas.  Gallbladder surgically absent.  Negative noncontrast adrenal glands.  Chronic right renal mid pole hypodense lesion is stable since 2008 with densitometry of a simple cyst.  Punctate right mid pole nephrolithiasis.  No right hydronephrosis, perinephric stranding, or hydroureter.  No right ureteral calculus. Left renal parapelvic cysts are chronic.  Punctate nephrolithiasis  at the left mid pole.  No left hydronephrosis, perinephric stranding, or hydroureter.  No left ureteral calculus. Unremarkable bladder.  Calcified atherosclerosis.  Unchanged small fat containing umbilical hernia.  No abdominal free fluid.  IMPRESSION: 1.  Mild acute diverticulitis at the junction of the descending and sigmoid colons.  No extraluminal gas or free fluid. 2.  Bilateral nephrolithiasis without acute obstructive uropathy or ureteral calculus.  Original Report Authenticated By: Harley Hallmark, M.D.     1. Diverticulitis   2. Medication side effects   3. Renal failure       MDM  Overall patient appears well with normal vital signs but a slightly dehydrated appearance. According to her laboratory workup she has elevated creatinine consistent with acute renal failure though mild, BUN is within normal limits the urinalysis show significant dehydration. IV fluids have been given as well as IV Zofran 4 nausea. Other electrolytes appear to be within normal limits.  Pt has accepted CT abd pelvis to r/o obstructive uropathy causing renal failure - but feels better after meds.    Results for orders placed during the hospital encounter of 07/19/11  URINALYSIS, ROUTINE W REFLEX MICROSCOPIC      Component Value Range   Color, Urine AMBER (*) YELLOW    Appearance CLEAR  CLEAR    Specific Gravity, Urine >1.030 (*) 1.005 - 1.030    pH 5.5  5.0 - 8.0    Glucose, UA NEGATIVE  NEGATIVE (mg/dL)   Hgb urine dipstick SMALL (*) NEGATIVE    Bilirubin Urine NEGATIVE  NEGATIVE    Ketones, ur NEGATIVE  NEGATIVE (mg/dL)   Protein, ur TRACE (*) NEGATIVE (mg/dL)   Urobilinogen, UA 0.2  0.0 - 1.0 (mg/dL)   Nitrite NEGATIVE  NEGATIVE    Leukocytes, UA TRACE (*) NEGATIVE   POCT I-STAT, CHEM 8      Component Value Range   Sodium 140  135 - 145 (mEq/L)   Potassium 3.4 (*) 3.5 - 5.1 (mEq/L)   Chloride 105  96 - 112 (mEq/L)   BUN 17  6 - 23 (mg/dL)   Creatinine, Ser 1.61 (*) 0.50 - 1.10 (mg/dL)    Glucose,  Bld 111 (*) 70 - 99 (mg/dL)   Calcium, Ion 6.04  5.40 - 1.32 (mmol/L)   TCO2 23  0 - 100 (mmol/L)   Hemoglobin 12.6  12.0 - 15.0 (g/dL)   HCT 98.1  19.1 - 47.8 (%)  URINE MICROSCOPIC-ADD ON      Component Value Range   Squamous Epithelial / LPF RARE  RARE    WBC, UA 0-2  <3 (WBC/hpf)   RBC / HPF 3-6  <3 (RBC/hpf)   Bacteria, UA RARE  RARE    Casts HYALINE CASTS (*) NEGATIVE    Ct Abdomen Pelvis Wo Contrast  07/20/2011  *RADIOLOGY REPORT*  Clinical Data: 76 year old female with recent lithotripsy.  Acute renal failure, nausea.  CT ABDOMEN AND PELVIS WITHOUT CONTRAST  Technique:  Multidetector CT imaging of the abdomen and pelvis was performed following the standard protocol without intravenous contrast.  Comparison: 06/03/2011 and earlier.  Findings: Lung bases are clear.  Moderate sized gastric hiatal hernia.  No pericardial or pleural effusion.  Osteopenia. No acute osseous abnormality identified.  Lower lumbar facet degeneration.  No pelvic free fluid.  Negative rectum, uterus, and adnexa.  Sigmoid diverticulosis.  Extensive diverticula continue into the descending colon.  At the junction of the descending and sigmoid colon there is mild surrounding inflammatory stranding (series 2 image 69).  There is mild thickening of the left lateral coronal fascia.  The inflammatory changes abate in the more proximal descending colon.  No extraluminal gas or fluid.  Diverticulosis continues to the splenic flexure.  There is retained stool in the more proximal colon.  No dilated small bowel. Negative distal stomach and duodenum.  Benign hypodense lesion in the left hepatic dome is minimally increased since 2008.  Otherwise negative noncontrast liver, spleen and pancreas.  Gallbladder surgically absent.  Negative noncontrast adrenal glands.  Chronic right renal mid pole hypodense lesion is stable since 2008 with densitometry of a simple cyst.  Punctate right mid pole nephrolithiasis.  No right  hydronephrosis, perinephric stranding, or hydroureter.  No right ureteral calculus. Left renal parapelvic cysts are chronic.  Punctate nephrolithiasis at the left mid pole.  No left hydronephrosis, perinephric stranding, or hydroureter.  No left ureteral calculus. Unremarkable bladder.  Calcified atherosclerosis.  Unchanged small fat containing umbilical hernia.  No abdominal free fluid.  IMPRESSION: 1.  Mild acute diverticulitis at the junction of the descending and sigmoid colons.  No extraluminal gas or free fluid. 2.  Bilateral nephrolithiasis without acute obstructive uropathy or ureteral calculus.  Original Report Authenticated By: Harley Hallmark, M.D.    No findings on CAT scan to suggest a urinary obstruction. Patient feels remarkably better after medications and desires discharge home.   Vida Roller, MD 07/20/11 [phone]

## 2011-09-07 ENCOUNTER — Ambulatory Visit (HOSPITAL_COMMUNITY): Payer: Medicare Other

## 2011-09-07 ENCOUNTER — Other Ambulatory Visit (HOSPITAL_COMMUNITY): Payer: Medicare Other

## 2011-09-08 ENCOUNTER — Encounter (HOSPITAL_COMMUNITY): Payer: Medicare Other | Attending: Oncology

## 2011-09-08 ENCOUNTER — Encounter (HOSPITAL_COMMUNITY): Payer: Self-pay

## 2011-09-08 ENCOUNTER — Encounter (HOSPITAL_BASED_OUTPATIENT_CLINIC_OR_DEPARTMENT_OTHER): Payer: Medicare Other

## 2011-09-08 ENCOUNTER — Other Ambulatory Visit (HOSPITAL_COMMUNITY): Payer: Self-pay | Admitting: Oncology

## 2011-09-08 DIAGNOSIS — M810 Age-related osteoporosis without current pathological fracture: Secondary | ICD-10-CM

## 2011-09-08 DIAGNOSIS — C50919 Malignant neoplasm of unspecified site of unspecified female breast: Secondary | ICD-10-CM | POA: Insufficient documentation

## 2011-09-08 LAB — COMPREHENSIVE METABOLIC PANEL
ALT: 13 U/L (ref 0–35)
AST: 15 U/L (ref 0–37)
Alkaline Phosphatase: 56 U/L (ref 39–117)
CO2: 29 mEq/L (ref 19–32)
Calcium: 10.5 mg/dL (ref 8.4–10.5)
GFR calc non Af Amer: 66 mL/min — ABNORMAL LOW (ref >90)
Potassium: 3.7 mEq/L (ref 3.5–5.1)
Sodium: 138 mEq/L (ref 135–145)

## 2011-09-08 MED ORDER — SODIUM CHLORIDE 0.9 % IJ SOLN
10.0000 mL | INTRAMUSCULAR | Status: DC | PRN
  Administered 2011-09-08: 10 mL via INTRAVENOUS
  Filled 2011-09-08: qty 10

## 2011-09-08 MED ORDER — SODIUM CHLORIDE 0.9 % IJ SOLN
INTRAMUSCULAR | Status: AC
  Filled 2011-09-08: qty 10

## 2011-09-08 MED ORDER — SODIUM CHLORIDE 0.9 % IV SOLN
INTRAVENOUS | Status: DC
  Administered 2011-09-08: 12:00:00 via INTRAVENOUS

## 2011-09-08 MED ORDER — ZOLEDRONIC ACID 4 MG/5ML IV CONC
4.0000 mg | Freq: Once | INTRAVENOUS | Status: AC
  Administered 2011-09-08: 4 mg via INTRAVENOUS
  Filled 2011-09-08: qty 5

## 2011-09-08 NOTE — Progress Notes (Signed)
Labs drawn today for cmp 

## 2011-09-08 NOTE — Progress Notes (Signed)
Tolerated infusion well. 

## 2011-09-12 ENCOUNTER — Emergency Department (HOSPITAL_COMMUNITY): Payer: Medicare Other

## 2011-09-12 ENCOUNTER — Emergency Department (HOSPITAL_COMMUNITY)
Admission: EM | Admit: 2011-09-12 | Discharge: 2011-09-12 | Disposition: A | Discharge status: Home / Self Care | Payer: Medicare Other | Attending: Emergency Medicine | Admitting: Emergency Medicine

## 2011-09-12 ENCOUNTER — Encounter (HOSPITAL_COMMUNITY): Payer: Self-pay

## 2011-09-12 DIAGNOSIS — N200 Calculus of kidney: Secondary | ICD-10-CM | POA: Diagnosis not present

## 2011-09-12 DIAGNOSIS — R109 Unspecified abdominal pain: Secondary | ICD-10-CM | POA: Diagnosis not present

## 2011-09-12 DIAGNOSIS — R432 Parageusia: Secondary | ICD-10-CM

## 2011-09-12 DIAGNOSIS — R110 Nausea: Secondary | ICD-10-CM | POA: Insufficient documentation

## 2011-09-12 DIAGNOSIS — R439 Unspecified disturbances of smell and taste: Secondary | ICD-10-CM | POA: Diagnosis not present

## 2011-09-12 DIAGNOSIS — K589 Irritable bowel syndrome without diarrhea: Secondary | ICD-10-CM | POA: Diagnosis not present

## 2011-09-12 DIAGNOSIS — Z853 Personal history of malignant neoplasm of breast: Secondary | ICD-10-CM | POA: Insufficient documentation

## 2011-09-12 DIAGNOSIS — R634 Abnormal weight loss: Secondary | ICD-10-CM | POA: Diagnosis not present

## 2011-09-12 DIAGNOSIS — I2510 Atherosclerotic heart disease of native coronary artery without angina pectoris: Secondary | ICD-10-CM | POA: Insufficient documentation

## 2011-09-12 DIAGNOSIS — F411 Generalized anxiety disorder: Secondary | ICD-10-CM | POA: Diagnosis not present

## 2011-09-12 DIAGNOSIS — Z87442 Personal history of urinary calculi: Secondary | ICD-10-CM | POA: Diagnosis not present

## 2011-09-12 DIAGNOSIS — I10 Essential (primary) hypertension: Secondary | ICD-10-CM | POA: Diagnosis not present

## 2011-09-12 DIAGNOSIS — Z9889 Other specified postprocedural states: Secondary | ICD-10-CM | POA: Insufficient documentation

## 2011-09-12 LAB — URINALYSIS, ROUTINE W REFLEX MICROSCOPIC
Bilirubin Urine: NEGATIVE
Nitrite: NEGATIVE
Specific Gravity, Urine: 1.02 (ref 1.005–1.030)
Urobilinogen, UA: 0.2 mg/dL (ref 0.0–1.0)

## 2011-09-12 LAB — URINE MICROSCOPIC-ADD ON

## 2011-09-12 MED ORDER — SODIUM CHLORIDE 0.9 % IV SOLN
Freq: Once | INTRAVENOUS | Status: AC
  Administered 2011-09-12: 04:00:00 via INTRAVENOUS

## 2011-09-12 MED ORDER — MORPHINE SULFATE 4 MG/ML IJ SOLN
4.0000 mg | Freq: Once | INTRAMUSCULAR | Status: AC
  Administered 2011-09-12: 4 mg via INTRAVENOUS
  Filled 2011-09-12: qty 1

## 2011-09-12 MED ORDER — ONDANSETRON HCL 4 MG/2ML IJ SOLN
4.0000 mg | Freq: Once | INTRAMUSCULAR | Status: AC
  Administered 2011-09-12: 4 mg via INTRAVENOUS
  Filled 2011-09-12: qty 2

## 2011-09-12 NOTE — ED Notes (Signed)
Pt reports having right flank pain for 15 min pta, woke her from sleep

## 2011-09-12 NOTE — ED Provider Notes (Signed)
History     CSN: [id number]  Arrival date & time 09/12/11  7829   First MD Initiated Contact with Patient 09/12/11 [phone]      Chief Complaint  Patient presents with  . Flank Pain    (Consider location/radiation/quality/duration/timing/severity/associated sxs/prior treatment) HPI  She relates she she was seen September 29 for  a right renal stone and had to be transferred to [facility] long where DrOttelin put in a stent she had in place for 3 weeks. She states she had lithotripsy in October. She relates she was treated for diverticulitis in November and was placed on Cipro and Flagyl which made her have a bad taste in her mouth. She states she's been losing weight since that time and becomes tearful stating she's lost about 30 pounds. She states she's discussed this with Dr. Abbe Amsterdam and he gave her Magic mouthwash which she finished about a week ago which hasn't helped. She feels she had allergic reaction to the Flagyl and Cipro that she was taking. She relates this morning she woke up with pain in her right flank that comes around into her right upper quadrant. She's had nausea without vomiting and denies dysuria. She states it feels like her prior kidney stones. Patient is also status post cholecystectomy. Nothing makes her pain feel worse nothing makes it feel better  PCP Dr. Juanetta Gosling  Past Medical History  Diagnosis Date  . History of chest pain     MI ruled out  . Non-occlusive coronary artery disease 2003    by Catheterization  . History of hiatal hernia   . History of esophageal spasm   . Irritable bowel syndrome   . Breast cancer 2003  . Syncope, non cardiac   . Osteoarthritis   . Diverticulitis     History  . HTN (hypertension)   . Hypokalemia   . Kidney stones   . Osteoporosis 09/08/2011    Past Surgical History  Procedure Date  . Thyroidectomy     Hypothyroidism status post  . Cholecystectomy 1997    Status post -stones  . Laparoscopic nissen fundoplication 1997  .  Arm fx     left  . Breast lumpectomy     Left  . Knee surgery   . Kidney stone surgery 1992  . Mandible reconstruction   . Cataract extraction    lithotripsy  No family history on file.  History  Substance Use Topics  . Smoking status: Never Smoker   . Smokeless tobacco: Not on file  . Alcohol Use: Yes     occasionally   lives at home  OB History    Grav Para Term Preterm Abortions TAB SAB Ect Mult Living                  Review of Systems  All other systems reviewed and are negative.    Allergies  Ciprofloxacin; Metronidazole; and Oxycodone  Home Medications   Current Outpatient Rx  Name Route Sig Dispense Refill  . ACETAMINOPHEN 500 MG PO TABS Oral Take 500 mg by mouth as needed. For headache     . AMLODIPINE BESY-BENAZEPRIL HCL 5-20 MG PO CAPS Oral Take 1 capsule by mouth daily.      Marland Kitchen ESOMEPRAZOLE MAGNESIUM 40 MG PO CPDR Oral Take 40 mg by mouth daily before breakfast.      . IBUPROFEN 200 MG PO TABS Oral Take 200 mg by mouth every 6 (six) hours as needed.      Marland Kitchen LEVOTHYROXINE SODIUM  125 MCG PO TABS Oral Take 100 mcg by mouth every morning.      Carma Leaven M PLUS PO TABS Oral Take 1 tablet by mouth daily.      Marland Kitchen ONDANSETRON 4 MG PO TBDP Oral Take 4 mg by mouth every 8 (eight) hours as needed. For nausea        BP 125/69  Pulse 90  Temp(Src) 97.9 F (36.6 C) (Oral)  Resp 22  Ht 5\' 8"  (1.727 m)  Wt 177 lb (80.287 kg)  BMI 26.91 kg/m2  SpO2 100% Vital signs normal    Physical Exam  Nursing note and vitals reviewed. Constitutional: She is oriented to person, place, and time. She appears well-developed and well-nourished. She is cooperative.  Non-toxic appearance. She does not appear ill. No distress.  HENT:  Head: Normocephalic and atraumatic.  Right Ear: External ear normal.  Left Ear: External ear normal.  Nose: Nose normal. No mucosal edema or rhinorrhea.  Mouth/Throat: Oropharynx is clear and moist and mucous membranes are normal. No dental  abscesses or uvula swelling.  Eyes: Conjunctivae and EOM are normal. Pupils are equal, round, and reactive to light.  Neck: Normal range of motion and full passive range of motion without pain. Neck supple.  Cardiovascular: Normal rate, regular rhythm and normal heart sounds.  Exam reveals no gallop and no friction rub.   No murmur heard. Pulmonary/Chest: Effort normal and breath sounds normal. Not tachypneic. No respiratory distress. She has no wheezes. She has no rhonchi. She has no rales. She exhibits no tenderness and no crepitus.  Abdominal: Soft. Normal appearance and bowel sounds are normal. She exhibits no distension. There is no tenderness. There is no rebound and no guarding.  Musculoskeletal: She exhibits no edema and no tenderness.       Thoracic back: She exhibits decreased range of motion, tenderness, bony tenderness, pain and spasm. She exhibits no swelling, no edema and no deformity.       Lumbar back: She exhibits decreased range of motion, tenderness, bony tenderness, pain and spasm. She exhibits no swelling, no edema and no deformity.       Patellar reflexes +2 and = bilaterally SLR neg bilaterally  Neurological: She is alert and oriented to person, place, and time. She has normal strength and normal reflexes. No cranial nerve deficit.  Skin: Skin is warm, dry and intact. No rash noted. No erythema. No pallor.  Psychiatric: Her speech is normal and behavior is normal. Thought content normal. Her mood appears not anxious.       Joslyn Devon seems anxious and she becomes tearful when talking about her prior medical problems his fall    ED Course  Procedures (including critical care time) Patient gienIV fluids, IV morphine and IV Zofran. She relates her pain is improved. Patient again starts crying and talks about her abnormal taste and weight loss. I am going to refer her to Dr. Suszanne Conners, ENT. I also did a brief search researching abnormal taste and going to recommend she try using zinc  and alpha lipoic acid supplements.    Results for orders placed during the hospital encounter of 09/12/11  URINALYSIS, ROUTINE W REFLEX MICROSCOPIC      Component Value Range   Color, Urine YELLOW  YELLOW    APPearance CLOUDY (*) CLEAR    Specific Gravity, Urine 1.020  1.005 - 1.030    pH 6.0  5.0 - 8.0    Glucose, UA NEGATIVE  NEGATIVE (mg/dL)   Hgb urine  dipstick SMALL (*) NEGATIVE    Bilirubin Urine NEGATIVE  NEGATIVE    Ketones, ur NEGATIVE  NEGATIVE (mg/dL)   Protein, ur NEGATIVE  NEGATIVE (mg/dL)   Urobilinogen, UA 0.2  0.0 - 1.0 (mg/dL)   Nitrite NEGATIVE  NEGATIVE    Leukocytes, UA MODERATE (*) NEGATIVE   URINE MICROSCOPIC-ADD ON      Component Value Range   Squamous Epithelial / LPF MANY (*) RARE    WBC, UA 21-50  <3 (WBC/hpf)   RBC / HPF 3-6  <3 (RBC/hpf)   Bacteria, UA MANY (*) RARE    Laboratory interpretation all normal except contaminated sample   Diagnoses that have been ruled out:  Diagnoses that are still under consideration:  Final diagnoses:  Flank pain  Weight loss  Abnormal sense of taste   Plan discharge to try zinc and alpha lipoic acid supplementation, ENT referral  Devoria Albe, MD, FACEP    MDM          Ward Givens, MD 09/12/11 0600

## 2011-09-14 ENCOUNTER — Ambulatory Visit (INDEPENDENT_AMBULATORY_CARE_PROVIDER_SITE_OTHER): Payer: Medicare Other | Admitting: Otolaryngology

## 2011-09-14 DIAGNOSIS — R1312 Dysphagia, oropharyngeal phase: Secondary | ICD-10-CM | POA: Diagnosis not present

## 2011-09-14 DIAGNOSIS — H40059 Ocular hypertension, unspecified eye: Secondary | ICD-10-CM | POA: Diagnosis not present

## 2011-09-14 DIAGNOSIS — K219 Gastro-esophageal reflux disease without esophagitis: Secondary | ICD-10-CM

## 2011-09-14 DIAGNOSIS — H5210 Myopia, unspecified eye: Secondary | ICD-10-CM | POA: Diagnosis not present

## 2011-09-14 DIAGNOSIS — Z961 Presence of intraocular lens: Secondary | ICD-10-CM | POA: Diagnosis not present

## 2011-09-14 DIAGNOSIS — H2589 Other age-related cataract: Secondary | ICD-10-CM | POA: Diagnosis not present

## 2011-09-15 ENCOUNTER — Other Ambulatory Visit (INDEPENDENT_AMBULATORY_CARE_PROVIDER_SITE_OTHER): Payer: Self-pay | Admitting: Otolaryngology

## 2011-09-15 ENCOUNTER — Other Ambulatory Visit: Payer: Self-pay | Admitting: Otolaryngology

## 2011-09-19 ENCOUNTER — Other Ambulatory Visit (HOSPITAL_COMMUNITY): Payer: Medicare Other

## 2011-09-20 ENCOUNTER — Other Ambulatory Visit (INDEPENDENT_AMBULATORY_CARE_PROVIDER_SITE_OTHER): Payer: Self-pay | Admitting: Otolaryngology

## 2011-09-20 DIAGNOSIS — H5210 Myopia, unspecified eye: Secondary | ICD-10-CM | POA: Diagnosis not present

## 2011-09-20 DIAGNOSIS — H40059 Ocular hypertension, unspecified eye: Secondary | ICD-10-CM | POA: Diagnosis not present

## 2011-09-20 DIAGNOSIS — H2589 Other age-related cataract: Secondary | ICD-10-CM | POA: Diagnosis not present

## 2011-09-20 DIAGNOSIS — Z961 Presence of intraocular lens: Secondary | ICD-10-CM | POA: Diagnosis not present

## 2011-09-21 ENCOUNTER — Ambulatory Visit (HOSPITAL_COMMUNITY): Payer: Medicare Other

## 2011-09-21 ENCOUNTER — Ambulatory Visit (HOSPITAL_COMMUNITY)
Admission: RE | Admit: 2011-09-21 | Discharge: 2011-09-21 | Disposition: A | Discharge status: Home / Self Care | Payer: Medicare Other | Source: Ambulatory Visit | Attending: Otolaryngology | Admitting: Otolaryngology

## 2011-09-21 DIAGNOSIS — H40059 Ocular hypertension, unspecified eye: Secondary | ICD-10-CM | POA: Diagnosis not present

## 2011-09-21 DIAGNOSIS — R634 Abnormal weight loss: Secondary | ICD-10-CM | POA: Diagnosis not present

## 2011-09-21 DIAGNOSIS — H2589 Other age-related cataract: Secondary | ICD-10-CM | POA: Diagnosis not present

## 2011-09-21 DIAGNOSIS — K224 Dyskinesia of esophagus: Secondary | ICD-10-CM | POA: Insufficient documentation

## 2011-09-21 DIAGNOSIS — H40019 Open angle with borderline findings, low risk, unspecified eye: Secondary | ICD-10-CM | POA: Diagnosis not present

## 2011-09-21 DIAGNOSIS — R110 Nausea: Secondary | ICD-10-CM | POA: Diagnosis not present

## 2011-09-21 DIAGNOSIS — H26499 Other secondary cataract, unspecified eye: Secondary | ICD-10-CM | POA: Diagnosis not present

## 2011-09-21 DIAGNOSIS — R1310 Dysphagia, unspecified: Secondary | ICD-10-CM | POA: Insufficient documentation

## 2011-09-21 DIAGNOSIS — K219 Gastro-esophageal reflux disease without esophagitis: Secondary | ICD-10-CM | POA: Diagnosis not present

## 2011-09-26 ENCOUNTER — Encounter (HOSPITAL_COMMUNITY): Payer: Self-pay | Admitting: Pharmacy Technician

## 2011-09-27 ENCOUNTER — Encounter (HOSPITAL_COMMUNITY)
Admission: RE | Admit: 2011-09-27 | Discharge: 2011-09-27 | Disposition: A | Discharge status: Home / Self Care | Payer: Medicare Other | Source: Ambulatory Visit | Attending: Ophthalmology | Admitting: Ophthalmology

## 2011-09-27 ENCOUNTER — Encounter (HOSPITAL_COMMUNITY): Payer: Self-pay

## 2011-09-27 HISTORY — DX: Dyslexia and alexia: R48.0

## 2011-09-27 HISTORY — DX: Hypothyroidism, unspecified: E03.9

## 2011-09-27 NOTE — Patient Instructions (Signed)
C20 [name]  09/27/2011   Your procedure is scheduled on:  Monday, 10/02/11  Report to [facility] at 0700 AM.  Call this number if you have problems the morning of surgery: [phone]   Remember:   Do not eat food:After Midnight.  May have clear liquids:until Midnight .  Clear liquids include soda, tea, black coffee, apple or grape juice, broth.  Take these medicines the morning of surgery with A SIP OF WATER: amlodipine, nexium, synthroid   Do not wear jewelry, make-up or nail polish.  Do not wear lotions, powders, or perfumes. You may wear deodorant.  Do not bring valuables to the hospital.  Contacts, dentures or bridgework may not be worn into surgery.    Patients discharged the day of surgery will not be allowed to drive home.  Name and phone number of your driver: driver  Special Instructions: Use eye drops as instructed.   Please read over the following fact sheets that you were given: Care and Recovery After Surgery   ataract Surgery Care After Please read the instructions outlined below and refer to this sheet in the next few weeks. These discharge instructions provide you with general information on caring for yourself after you leave the hospital. Your caregiver may also give you specific instructions.  HOME CARE INSTRUCTIONS   After surgery, your caregiver will schedule exams to check on your progress. For a few days after surgery, you may be prescribed eyedrops or other medications to help healing and control the pressure inside your eye. Ask your caregiver how to use your medications, when to take them, and what effects they can have.   It's normal to feel itching and mild discomfort for a few days after cataract surgery. Some fluid discharge is also common, and your eye may be sensitive to light and touch. If you have discomfort, your caregiver may suggest a pain reliever every 4 to 6 hours. After 1 to 2 days, even moderate discomfort should disappear. In most cases,  healing will take about 6 weeks.   Try not to touch or rub your eyes You may be instructed to wear an eye shield or eye patch after surgery. If not, wear sunglasses to protect your eyes.   Keep soap and shampoo out of your eyes.   Only take over-the-counter or prescription medicines for pain, discomfort, or fever as directed by your caregiver.   If you suffer more than mild pain, or you experience loss of vision or increasing redness of your eye, you should contact your caregiver for advice.   When you are home, try not to bend or lift heavy objects. Bending increases pressure in the eye. You can walk, climb stairs, and do light household chores. You can quickly return to many everyday activities, but your vision may be blurry. Ask your caregiver when you can resume driving.   If you just received an IOL (intraocular lens), you may notice that colors are very bright or have a blue tinge. Also, if you've been in bright sunlight, everything may appear reddish for a few hours. If you see these color tinges, it is because your lens is clear and no longer cloudy. Within a few months after receiving an IOL, these "extra" colors should go away. When you have healed, you will probably need new glasses.  SEEK MEDICAL CARE IF:   There is increased bruising around your eye.   You have a worsening or sudden loss of vision.   You notice redness,  swelling, or increasing pain in the eye.   You have a fever.  Although we do not know how to protect against cataracts, people over the age of 24 are at risk for many vision problems. If you are age 29 or older, you should have an eye examination which includes dilating the pupils at least every 2 years. This kind of exam allows your eye care professional to check for signs of age-related macular degeneration, glaucoma, cataracts, and other vision disorders. Document Released: 03/10/2005 Document Revised: 05/03/2011 Document Reviewed: 08/19/2008 [facility] Patient  Information 2012 [place], [place].

## 2011-09-29 MED ORDER — TETRACAINE HCL 0.5 % OP SOLN
OPHTHALMIC | Status: AC
  Filled 2011-09-29: qty 2

## 2011-09-29 MED ORDER — CYCLOPENTOLATE-PHENYLEPHRINE 0.2-1 % OP SOLN
OPHTHALMIC | Status: AC
  Filled 2011-09-29: qty 2

## 2011-09-29 MED ORDER — LIDOCAINE HCL (PF) 1 % IJ SOLN
INTRAMUSCULAR | Status: AC
  Filled 2011-09-29: qty 2

## 2011-09-29 MED ORDER — LIDOCAINE HCL 3.5 % OP GEL
OPHTHALMIC | Status: AC
  Filled 2011-09-29: qty 5

## 2011-09-29 MED ORDER — NEOMYCIN-POLYMYXIN-DEXAMETH 3.5-10000-0.1 OP OINT
TOPICAL_OINTMENT | OPHTHALMIC | Status: AC
  Filled 2011-09-29: qty 3.5

## 2011-10-02 ENCOUNTER — Encounter (HOSPITAL_COMMUNITY)
Admission: RE | Disposition: A | Discharge status: Home / Self Care | Payer: Self-pay | Source: Ambulatory Visit | Attending: Ophthalmology

## 2011-10-02 ENCOUNTER — Encounter (HOSPITAL_COMMUNITY): Payer: Self-pay | Admitting: Anesthesiology

## 2011-10-02 ENCOUNTER — Ambulatory Visit (HOSPITAL_COMMUNITY): Payer: Medicare Other | Admitting: Anesthesiology

## 2011-10-02 ENCOUNTER — Encounter (HOSPITAL_COMMUNITY): Payer: Self-pay | Admitting: *Deleted

## 2011-10-02 ENCOUNTER — Ambulatory Visit (HOSPITAL_COMMUNITY)
Admission: RE | Admit: 2011-10-02 | Discharge: 2011-10-02 | Disposition: A | Discharge status: Home / Self Care | Payer: Medicare Other | Source: Ambulatory Visit | Attending: Ophthalmology | Admitting: Ophthalmology

## 2011-10-02 DIAGNOSIS — I10 Essential (primary) hypertension: Secondary | ICD-10-CM | POA: Insufficient documentation

## 2011-10-02 DIAGNOSIS — H2589 Other age-related cataract: Secondary | ICD-10-CM | POA: Insufficient documentation

## 2011-10-02 DIAGNOSIS — Z79899 Other long term (current) drug therapy: Secondary | ICD-10-CM | POA: Diagnosis not present

## 2011-10-02 DIAGNOSIS — H269 Unspecified cataract: Secondary | ICD-10-CM | POA: Diagnosis not present

## 2011-10-02 HISTORY — PX: CATARACT EXTRACTION W/PHACO: SHX586

## 2011-10-02 SURGERY — PHACOEMULSIFICATION, CATARACT, WITH IOL INSERTION
Anesthesia: Monitor Anesthesia Care | Site: Eye | Laterality: Right | Wound class: Clean

## 2011-10-02 MED ORDER — TETRACAINE HCL 0.5 % OP SOLN
1.0000 [drp] | OPHTHALMIC | Status: AC
Start: 2011-10-02 — End: 2011-10-02
  Administered 2011-10-02 (×3): 1 [drp] via OPHTHALMIC

## 2011-10-02 MED ORDER — PROVISC 10 MG/ML IO SOLN
INTRAOCULAR | Status: DC | PRN
  Administered 2011-10-02: .85 mL via INTRAOCULAR

## 2011-10-02 MED ORDER — CYCLOPENTOLATE-PHENYLEPHRINE 0.2-1 % OP SOLN
1.0000 [drp] | OPHTHALMIC | Status: AC
  Administered 2011-10-02 (×3): 1 [drp] via OPHTHALMIC

## 2011-10-02 MED ORDER — BSS IO SOLN
INTRAOCULAR | Status: DC | PRN
  Administered 2011-10-02: 30 mL via INTRAOCULAR

## 2011-10-02 MED ORDER — PHENYLEPHRINE HCL 2.5 % OP SOLN
OPHTHALMIC | Status: AC
  Filled 2011-10-02: qty 2

## 2011-10-02 MED ORDER — LACTATED RINGERS IV SOLN
INTRAVENOUS | Status: DC
  Administered 2011-10-02: 08:00:00 via INTRAVENOUS

## 2011-10-02 MED ORDER — LIDOCAINE 3.5 % OP GEL OPTIME - NO CHARGE
OPHTHALMIC | Status: DC | PRN
  Administered 2011-10-02: 1 [drp] via OPHTHALMIC

## 2011-10-02 MED ORDER — EPINEPHRINE HCL 1 MG/ML IJ SOLN
INTRAOCULAR | Status: DC | PRN
  Administered 2011-10-02: 09:00:00

## 2011-10-02 MED ORDER — EPINEPHRINE HCL 1 MG/ML IJ SOLN
INTRAMUSCULAR | Status: AC
  Filled 2011-10-02: qty 1

## 2011-10-02 MED ORDER — MIDAZOLAM HCL 2 MG/2ML IJ SOLN
1.0000 mg | INTRAMUSCULAR | Status: DC | PRN
  Administered 2011-10-02: 2 mg via INTRAVENOUS

## 2011-10-02 MED ORDER — POVIDONE-IODINE 5 % OP SOLN
OPHTHALMIC | Status: DC | PRN
  Administered 2011-10-02: 1 via OPHTHALMIC

## 2011-10-02 MED ORDER — NEOMYCIN-POLYMYXIN-DEXAMETH 0.1 % OP OINT
TOPICAL_OINTMENT | OPHTHALMIC | Status: DC | PRN
  Administered 2011-10-02: 1 via OPHTHALMIC

## 2011-10-02 MED ORDER — MIDAZOLAM HCL 2 MG/2ML IJ SOLN
INTRAMUSCULAR | Status: AC
  Filled 2011-10-02: qty 2

## 2011-10-02 MED ORDER — PHENYLEPHRINE HCL 2.5 % OP SOLN
1.0000 [drp] | OPHTHALMIC | Status: AC
Start: 2011-10-02 — End: 2011-10-02
  Administered 2011-10-02 (×3): 1 [drp] via OPHTHALMIC

## 2011-10-02 MED ORDER — LIDOCAINE HCL (PF) 1 % IJ SOLN
INTRAMUSCULAR | Status: DC | PRN
  Administered 2011-10-02: .4 mL

## 2011-10-02 MED ORDER — LIDOCAINE HCL 3.5 % OP GEL
1.0000 "application " | Freq: Once | OPHTHALMIC | Status: AC
  Administered 2011-10-02: 1 via OPHTHALMIC

## 2011-10-02 SURGICAL SUPPLY — 31 items
CAPSULAR TENSION RING-AMO (OPHTHALMIC RELATED) IMPLANT
CLOTH BEACON ORANGE TIMEOUT ST (SAFETY) ×1 IMPLANT
EYE SHIELD UNIVERSAL CLEAR (GAUZE/BANDAGES/DRESSINGS) ×1 IMPLANT
GLOVE BIO SURGEON STRL SZ 6.5 (GLOVE) IMPLANT
GLOVE BIOGEL PI IND STRL 6.5 (GLOVE) IMPLANT
GLOVE BIOGEL PI IND STRL 7.0 (GLOVE) IMPLANT
GLOVE BIOGEL PI IND STRL 7.5 (GLOVE) IMPLANT
GLOVE BIOGEL PI INDICATOR 6.5 (GLOVE)
GLOVE BIOGEL PI INDICATOR 7.0 (GLOVE) ×1
GLOVE BIOGEL PI INDICATOR 7.5 (GLOVE)
GLOVE ECLIPSE 6.5 STRL STRAW (GLOVE) IMPLANT
GLOVE ECLIPSE 7.0 STRL STRAW (GLOVE) IMPLANT
GLOVE ECLIPSE 7.5 STRL STRAW (GLOVE) IMPLANT
GLOVE EXAM NITRILE LRG STRL (GLOVE) IMPLANT
GLOVE EXAM NITRILE MD LF STRL (GLOVE) ×1 IMPLANT
GLOVE SKINSENSE NS SZ6.5 (GLOVE)
GLOVE SKINSENSE NS SZ7.0 (GLOVE)
GLOVE SKINSENSE STRL SZ6.5 (GLOVE) IMPLANT
GLOVE SKINSENSE STRL SZ7.0 (GLOVE) IMPLANT
KIT VITRECTOMY (OPHTHALMIC RELATED) IMPLANT
PAD ARMBOARD 7.5X6 YLW CONV (MISCELLANEOUS) ×1 IMPLANT
PROC W NO LENS (INTRAOCULAR LENS)
PROC W SPEC LENS (INTRAOCULAR LENS)
PROCESS W NO LENS (INTRAOCULAR LENS) IMPLANT
PROCESS W SPEC LENS (INTRAOCULAR LENS) IMPLANT
RING MALYGIN (MISCELLANEOUS) IMPLANT
SIGHTPATH CAT PROC W REG LENS (Ophthalmic Related) ×2 IMPLANT
SYR TB 1ML LL NO SAFETY (SYRINGE) ×2 IMPLANT
TAPE CLOTH 1X10 TAN NS (GAUZE/BANDAGES/DRESSINGS) ×1 IMPLANT
VISCOELASTIC ADDITIONAL (OPHTHALMIC RELATED) IMPLANT
WATER STERILE IRR 250ML POUR (IV SOLUTION) ×1 IMPLANT

## 2011-10-02 NOTE — Anesthesia Preprocedure Evaluation (Signed)
Anesthesia Evaluation  Patient identified by MRN, date of birth, ID band Patient awake    Reviewed: Allergy & Precautions, H&P , NPO status , Patient's Chart, lab work & pertinent test results  History of Anesthesia Complications Negative for: history of anesthetic complications  Airway Mallampati: II      Dental  (+) Edentulous Upper and Edentulous Lower   Pulmonary  clear to auscultation        Cardiovascular hypertension, Pt. on medications + CAD Regular Normal    Neuro/Psych    GI/Hepatic GERD-  ,  Endo/Other  Hypothyroidism   Renal/GU      Musculoskeletal   Abdominal   Peds  Hematology   Anesthesia Other Findings   Reproductive/Obstetrics                           Anesthesia Physical Anesthesia Plan  ASA: III  Anesthesia Plan: MAC   Post-op Pain Management:    Induction: Intravenous  Airway Management Planned: Nasal Cannula  Additional Equipment:   Intra-op Plan:   Post-operative Plan:   Informed Consent: I have reviewed the patients History and Physical, chart, labs and discussed the procedure including the risks, benefits and alternatives for the proposed anesthesia with the patient or authorized representative who has indicated his/her understanding and acceptance.     Plan Discussed with:   Anesthesia Plan Comments:         Anesthesia Quick Evaluation

## 2011-10-02 NOTE — Anesthesia Postprocedure Evaluation (Signed)
  Anesthesia Post-op Note  Patient: [name]  Procedure(s) Performed:  CATARACT EXTRACTION PHACO AND INTRAOCULAR LENS PLACEMENT (IOC) - CDE:17.01  Patient Location: PACU and Short Stay  Anesthesia Type: MAC  Level of Consciousness: awake, alert  and oriented  Airway and Oxygen Therapy: Patient Spontanous Breathing  Post-op Pain: none  Post-op Assessment: Post-op Vital signs reviewed, Patient's Cardiovascular Status Stable, Respiratory Function Stable and No signs of Nausea or vomiting  Post-op Vital Signs: Reviewed and stable  Complications: No apparent anesthesia complications

## 2011-10-02 NOTE — H&P (Signed)
I have reviewed the H&P, the patient was re-examined, and I have identified no interval changes in medical condition and plan of care since the history and physical of record  

## 2011-10-02 NOTE — Brief Op Note (Signed)
Pre-Op Dx: Cataract OD Post-Op Dx: Cataract OD Surgeon: Deseray Daponte Anesthesia: Topical with MAC Implant: B&L enVista Blood Loss: None Specimen: None Complications: None 

## 2011-10-02 NOTE — Op Note (Signed)
[name], PIACENTE NO.:  [id number]  MEDICAL RECORD NO.:  [id number]  LOCATION:  [facility]                          FACILITY:  [facility]  PHYSICIAN:  Susanne Greenhouse, MD       DATE OF BIRTH:  May 04, 1935  DATE OF PROCEDURE:  10/02/2011 DATE OF DISCHARGE:                              OPERATIVE REPORT   PREOPERATIVE DIAGNOSIS:  Combined cataract, right eye, diagnosis code 366.19.  POSTOPERATIVE DIAGNOSIS:  Combined cataract, right eye, diagnosis code 366.19.  OPERATION PERFORMED:  Phacoemulsification with posterior chamber intraocular lens implantation, right eye.  SURGEON:  Bonne Dolores. Aqib Lough, MD  ANESTHESIA:  General endotracheal anesthesia.  OPERATIVE SUMMARY:  In the preoperative area, dilating drops were placed into the right eye.  The patient was then brought into the operating room where she was placed under general anesthesia.  The eye was then prepped and draped.  Beginning with a 75 blade, a paracentesis port was made at the surgeon's 2 o'clock position.  The anterior chamber was then filled with a 1% nonpreserved lidocaine solution with epinephrine.  This was followed by Viscoat to deepen the chamber.  A small fornix-based peritomy was performed superiorly.  Next, a single iris hook was placed through the limbus superiorly.  A 2.4-mm keratome blade was then used to make a clear corneal incision over the iris hook.  A bent cystotome needle and Utrata forceps were used to create a continuous tear capsulotomy.  Hydrodissection was performed using balanced salt solution on a fine cannula.  The lens nucleus was then removed using phacoemulsification in a quadrant cracking technique.  The cortical material was then removed with irrigation and aspiration.  The capsular bag and anterior chamber were refilled with Provisc.  The wound was widened to approximately 3 mm and a posterior chamber intraocular lens was placed into the capsular bag without difficulty using an  [employer] lens injecting system.  A single 10-0 nylon suture was then used to close the incision as well as stromal hydration.  The Provisc was removed from the anterior chamber and capsular bag with irrigation and aspiration.  At this point, the wounds were tested for leak, which were negative.  The anterior chamber remained deep and stable.  The patient tolerated the procedure well.  There were no operative complications, and she awoke from general anesthesia without problem.  No surgical specimens.  Prosthetic device used is a Cabin crew posterior chamber lens, model MX60, power of 17.5, serial number is [id number].          ______________________________ Susanne Greenhouse, MD     KEH/MEDQ  D:  10/02/2011  T:  10/02/2011  Job:  [id number]

## 2011-10-02 NOTE — Transfer of Care (Signed)
Immediate Anesthesia Transfer of Care Note  Patient: [name]  Procedure(s) Performed:  CATARACT EXTRACTION PHACO AND INTRAOCULAR LENS PLACEMENT (IOC) - CDE:17.01  Patient Location: PACU and Short Stay  Anesthesia Type: MAC  Level of Consciousness: awake, alert  and oriented  Airway & Oxygen Therapy: Patient Spontanous Breathing  Post-op Assessment: Report given to PACU RN  Post vital signs: Reviewed and stable  Complications: No apparent anesthesia complications

## 2011-10-05 ENCOUNTER — Encounter (HOSPITAL_COMMUNITY): Payer: Self-pay | Admitting: Ophthalmology

## 2011-10-05 DIAGNOSIS — M545 Low back pain, unspecified: Secondary | ICD-10-CM | POA: Diagnosis not present

## 2011-10-05 DIAGNOSIS — N200 Calculus of kidney: Secondary | ICD-10-CM | POA: Diagnosis not present

## 2011-10-05 DIAGNOSIS — N281 Cyst of kidney, acquired: Secondary | ICD-10-CM | POA: Diagnosis not present

## 2011-10-12 ENCOUNTER — Ambulatory Visit (INDEPENDENT_AMBULATORY_CARE_PROVIDER_SITE_OTHER): Payer: Medicare Other | Admitting: Otolaryngology

## 2011-10-12 DIAGNOSIS — J310 Chronic rhinitis: Secondary | ICD-10-CM

## 2011-10-23 DIAGNOSIS — I10 Essential (primary) hypertension: Secondary | ICD-10-CM | POA: Diagnosis not present

## 2011-10-23 DIAGNOSIS — E039 Hypothyroidism, unspecified: Secondary | ICD-10-CM | POA: Diagnosis not present

## 2011-10-23 DIAGNOSIS — K229 Disease of esophagus, unspecified: Secondary | ICD-10-CM | POA: Diagnosis not present

## 2011-10-30 ENCOUNTER — Encounter: Payer: Self-pay | Admitting: Gastroenterology

## 2011-11-03 ENCOUNTER — Emergency Department (HOSPITAL_COMMUNITY)
Admission: EM | Admit: 2011-11-03 | Discharge: 2011-11-03 | Disposition: A | Discharge status: Home / Self Care | Payer: Medicare Other | Attending: Emergency Medicine | Admitting: Emergency Medicine

## 2011-11-03 ENCOUNTER — Encounter (HOSPITAL_COMMUNITY): Payer: Self-pay | Admitting: *Deleted

## 2011-11-03 DIAGNOSIS — R634 Abnormal weight loss: Secondary | ICD-10-CM | POA: Diagnosis not present

## 2011-11-03 DIAGNOSIS — Z79899 Other long term (current) drug therapy: Secondary | ICD-10-CM | POA: Diagnosis not present

## 2011-11-03 DIAGNOSIS — M545 Low back pain, unspecified: Secondary | ICD-10-CM | POA: Diagnosis not present

## 2011-11-03 DIAGNOSIS — K589 Irritable bowel syndrome without diarrhea: Secondary | ICD-10-CM | POA: Insufficient documentation

## 2011-11-03 DIAGNOSIS — F411 Generalized anxiety disorder: Secondary | ICD-10-CM | POA: Diagnosis not present

## 2011-11-03 DIAGNOSIS — I10 Essential (primary) hypertension: Secondary | ICD-10-CM | POA: Insufficient documentation

## 2011-11-03 DIAGNOSIS — I2510 Atherosclerotic heart disease of native coronary artery without angina pectoris: Secondary | ICD-10-CM | POA: Insufficient documentation

## 2011-11-03 DIAGNOSIS — E039 Hypothyroidism, unspecified: Secondary | ICD-10-CM | POA: Insufficient documentation

## 2011-11-03 DIAGNOSIS — M810 Age-related osteoporosis without current pathological fracture: Secondary | ICD-10-CM | POA: Diagnosis not present

## 2011-11-03 DIAGNOSIS — R1011 Right upper quadrant pain: Secondary | ICD-10-CM | POA: Diagnosis not present

## 2011-11-03 DIAGNOSIS — M549 Dorsalgia, unspecified: Secondary | ICD-10-CM | POA: Diagnosis not present

## 2011-11-03 DIAGNOSIS — Z853 Personal history of malignant neoplasm of breast: Secondary | ICD-10-CM | POA: Insufficient documentation

## 2011-11-03 DIAGNOSIS — M1990 Unspecified osteoarthritis, unspecified site: Secondary | ICD-10-CM | POA: Insufficient documentation

## 2011-11-03 DIAGNOSIS — R300 Dysuria: Secondary | ICD-10-CM | POA: Diagnosis not present

## 2011-11-03 LAB — COMPREHENSIVE METABOLIC PANEL
Albumin: 3.3 g/dL — ABNORMAL LOW (ref 3.5–5.2)
BUN: 19 mg/dL (ref 6–23)
Calcium: 10.2 mg/dL (ref 8.4–10.5)
Chloride: 104 mEq/L (ref 96–112)
Creatinine, Ser: 0.75 mg/dL (ref 0.50–1.10)
Total Bilirubin: 0.3 mg/dL (ref 0.3–1.2)
Total Protein: 6.4 g/dL (ref 6.0–8.3)

## 2011-11-03 LAB — DIFFERENTIAL
Basophils Relative: 0 % (ref 0–1)
Eosinophils Absolute: 0.1 10*3/uL (ref 0.0–0.7)
Eosinophils Relative: 2 % (ref 0–5)
Monocytes Absolute: 0.6 10*3/uL (ref 0.1–1.0)
Monocytes Relative: 9 % (ref 3–12)

## 2011-11-03 LAB — CBC
HCT: 34.1 % — ABNORMAL LOW (ref 36.0–46.0)
Hemoglobin: 11 g/dL — ABNORMAL LOW (ref 12.0–15.0)
MCH: 29 pg (ref 26.0–34.0)
MCHC: 32.3 g/dL (ref 30.0–36.0)
MCV: 90 fL (ref 78.0–100.0)

## 2011-11-03 LAB — URINALYSIS, ROUTINE W REFLEX MICROSCOPIC
Ketones, ur: NEGATIVE mg/dL
Nitrite: NEGATIVE
Protein, ur: NEGATIVE mg/dL
pH: 6.5 (ref 5.0–8.0)

## 2011-11-03 LAB — URINE MICROSCOPIC-ADD ON

## 2011-11-03 LAB — LIPASE, BLOOD: Lipase: 25 U/L (ref 11–59)

## 2011-11-03 NOTE — ED Notes (Addendum)
Patient states she  Has had right side back pain and right side rib pain since November 2012 after having a kidney stone blasted for removal. Patient states she has hx of breast cancer and was told she had MI in the past. Patient rambling about medical issues and story is unclear when cancer and MI. Patient states left are can not be used for blood draws per her doctors and no BP due to breast cancer. Patient resting and patients husband at bedside.

## 2011-11-03 NOTE — ED Provider Notes (Cosign Needed)
History     CSN: [id number]  Arrival date & time 11/03/11  4098   First MD Initiated Contact with Patient 11/03/11 [id number]      Chief Complaint  Patient presents with  . Back Pain    (Consider location/radiation/quality/duration/timing/severity/associated sxs/prior treatment) Patient is a 77 y.o. female presenting with back pain. The history is provided by the patient.  Back Pain  This is a chronic problem. Episode onset: 4 months ago. The problem occurs daily. The problem has not changed since onset.Radiates to: Right upper abdomen. The pain is moderate. Exacerbated by: Nothing. The pain is the same all the time. Associated symptoms include dysuria. Pertinent negatives include no weakness. Associated symptoms comments: She has had a 40 pound weight loss. Treatments tried: She has been prescribed antibiotics several times. The treatment provided no relief.   the patient has been evaluated by her urologist, treated for a ureter stone by lithotripsy, been seen by her PCP, has been seen in emergency department. She had a CT scan of the abdomen in January 2013. There was a mild abnormality of the colon. Subsequent to that she went to see a GI doctor and had a colonoscopy that was read as normal. The patient has scheduled appointment with no GI for a second opinion and that is 5 days. She currently has no problems eating. She's not had any fever or chills. She denies cough, chest pain, shortness of breath. There's been no vertigo, headache, or paresthesias.  Past Medical History  Diagnosis Date  . History of chest pain     MI ruled out  . Non-occlusive coronary artery disease 2003    by Catheterization  . History of hiatal hernia   . History of esophageal spasm   . Irritable bowel syndrome   . Breast cancer 2003  . Syncope, non cardiac   . Osteoarthritis   . Diverticulitis     History  . HTN (hypertension)   . Hypokalemia   . Kidney stones   . Osteoporosis 09/08/2011  . Hypothyroidism   .  Dyslexia     Past Surgical History  Procedure Date  . Thyroidectomy     Hypothyroidism status post  . Cholecystectomy 1997    Status post -stones  . Laparoscopic nissen fundoplication 1997  . Arm fx     left  . Breast lumpectomy     Left  . Knee surgery   . Kidney stone surgery 1992  . Mandible reconstruction   . Cataract extraction   . Neck surgery 2000    [facility]  . Cataract extraction w/phaco 10/02/2011    Procedure: CATARACT EXTRACTION PHACO AND INTRAOCULAR LENS PLACEMENT (IOC);  Surgeon: Gemma Payor, MD;  Location: [facility];  Service: Ophthalmology;  Laterality: Right;  CDE:17.01    Family History  Problem Relation Age of Onset  . Anesthesia problems Neg Hx   . Hypotension Neg Hx   . Pseudochol deficiency Neg Hx   . Malignant hyperthermia Neg Hx     History  Substance Use Topics  . Smoking status: Never Smoker   . Smokeless tobacco: Not on file  . Alcohol Use: Yes     occasionally    OB History    Grav Para Term Preterm Abortions TAB SAB Ect Mult Living                  Review of Systems  Genitourinary: Positive for dysuria.  Musculoskeletal: Positive for back pain.  Neurological: Negative for weakness.  All other  systems reviewed and are negative.    Allergies  Ciprofloxacin; Metronidazole; and Oxycodone  Home Medications   Current Outpatient Rx  Name Route Sig Dispense Refill  . ACETAMINOPHEN 500 MG PO TABS Oral Take 1,000 mg by mouth every 6 (six) hours as needed. For headache    . AMLODIPINE BESY-BENAZEPRIL HCL 5-20 MG PO CAPS Oral Take 1 capsule by mouth daily.      Marland Kitchen ESOMEPRAZOLE MAGNESIUM 40 MG PO CPDR Oral Take 40 mg by mouth daily before breakfast.      . LEVOTHYROXINE SODIUM 100 MCG PO TABS Oral Take 100 mcg by mouth daily.    Carma Leaven M PLUS PO TABS Oral Take 1 tablet by mouth daily.      Marland Kitchen ONDANSETRON 4 MG PO TBDP Oral Take 4 mg by mouth every 8 (eight) hours as needed. For nausea        BP 140/54  Pulse 57  Temp(Src) 97.8 F (36.6 C)  (Oral)  Resp 20  SpO2 98%  Physical Exam  Nursing note and vitals reviewed. Constitutional: She is oriented to person, place, and time. She appears well-developed and well-nourished.  HENT:  Head: Normocephalic and atraumatic.  Eyes: Conjunctivae and EOM are normal. Pupils are equal, round, and reactive to light.  Neck: Normal range of motion and phonation normal. Neck supple.  Cardiovascular: Normal rate, regular rhythm and intact distal pulses.   Pulmonary/Chest: Effort normal and breath sounds normal. She exhibits no tenderness.  Abdominal: Soft. She exhibits no distension. There is no tenderness (Mild right upper quadrant and epigastric tenderness). There is no guarding.  Musculoskeletal: Normal range of motion.       She has diffuse right flank and right lumbar tenderness to light touch  Neurological: She is alert and oriented to person, place, and time. She has normal strength. She exhibits normal muscle tone.  Skin: Skin is warm and dry.  Psychiatric: Her behavior is normal. Judgment and thought content normal.       She is anxious    ED Course  Procedures (including critical care time) This is assessment is nonspecific flank pain radiating to abdomen; with likely etiologies in her being musculus multiple sores versus urinary tract infection. She is status post cholecystectomy. She had a recent CT of the abdomen during the time that she had the pain and she has had a colonoscopy that was reassuring. It is doubtful that she and urgent condition that will require hospitalization. Screening labs ordered.  12:56 PM Reevaluation with update and discussion. After initial assessment and treatment, an updated evaluation reveals no further c/o. [name] L    Labs Reviewed  CBC - Abnormal; Notable for the following:    RBC 3.79 (*)    Hemoglobin 11.0 (*)    HCT 34.1 (*)    All other components within normal limits  COMPREHENSIVE METABOLIC PANEL - Abnormal; Notable for the  following:    Albumin 3.3 (*)    GFR calc non Af Amer 81 (*)    All other components within normal limits  URINALYSIS, ROUTINE W REFLEX MICROSCOPIC - Abnormal; Notable for the following:    APPearance CLOUDY (*)    Hgb urine dipstick TRACE (*)    Leukocytes, UA SMALL (*)    All other components within normal limits  URINE MICROSCOPIC-ADD ON - Abnormal; Notable for the following:    Squamous Epithelial / LPF FEW (*)    All other components within normal limits  DIFFERENTIAL  LIPASE, BLOOD  URINE CULTURE   No results found.   1. Back pain       MDM  Nonspecific pain, with ongoing problems for several months and reassuring evaluation in the emergency department. Doubt UTI, occult infection, metabolic instability or acute central nervous system abnormality        Flint Melter, MD 11/03/11 1256

## 2011-11-03 NOTE — Discharge Instructions (Signed)
See her doctor as scheduled. Return here if needed for problems. Back Pain, Adult Low back pain is very common. About 1 in 5 people have back pain.The cause of low back pain is rarely dangerous. The pain often gets better over time.About half of people with a sudden onset of back pain feel better in just 2 weeks. About 8 in 10 people feel better by 6 weeks.  CAUSES Some common causes of back pain include:  Strain of the muscles or ligaments supporting the spine.   Wear and tear (degeneration) of the spinal discs.   Arthritis.   Direct injury to the back.  DIAGNOSIS Most of the time, the direct cause of low back pain is not known.However, back pain can be treated effectively even when the exact cause of the pain is unknown.Answering your caregiver's questions about your overall health and symptoms is one of the most accurate ways to make sure the cause of your pain is not dangerous. If your caregiver needs more information, he or she may order lab work or imaging tests (X-rays or MRIs).However, even if imaging tests show changes in your back, this usually does not require surgery. HOME CARE INSTRUCTIONS For many people, back pain returns.Since low back pain is rarely dangerous, it is often a condition that people can learn to [facility] their own.   Remain active. It is stressful on the back to sit or stand in one place. Do not sit, drive, or stand in one place for more than 30 minutes at a time. Take short walks on level surfaces as soon as pain allows.Try to increase the length of time you walk each day.   Do not stay in bed.Resting more than 1 or 2 days can delay your recovery.   Do not avoid exercise or work.Your body is made to move.It is not dangerous to be active, even though your back may hurt.Your back will likely heal faster if you return to being active before your pain is gone.   Pay attention to your body when you bend and lift. Many people have less discomfortwhen  lifting if they bend their knees, keep the load close to their bodies,and avoid twisting. Often, the most comfortable positions are those that put less stress on your recovering back.   Find a comfortable position to sleep. Use a firm mattress and lie on your side with your knees slightly bent. If you lie on your back, put a pillow under your knees.   Only take over-the-counter or prescription medicines as directed by your caregiver. Over-the-counter medicines to reduce pain and inflammation are often the most helpful.Your caregiver may prescribe muscle relaxant drugs.These medicines help dull your pain so you can more quickly return to your normal activities and healthy exercise.   Put ice on the injured area.   Put ice in a plastic bag.   Place a towel between your skin and the bag.   Leave the ice on for 15 to 20 minutes, 3 to 4 times a day for the first 2 to 3 days. After that, ice and heat may be alternated to reduce pain and spasms.   Ask your caregiver about trying back exercises and gentle massage. This may be of some benefit.   Avoid feeling anxious or stressed.Stress increases muscle tension and can worsen back pain.It is important to recognize when you are anxious or stressed and learn ways to manage it.Exercise is a great option.  SEEK MEDICAL CARE IF:  You have pain that  is not relieved with rest or medicine.   You have pain that does not improve in 1 week.   You have new symptoms.   You are generally not feeling well.  SEEK IMMEDIATE MEDICAL CARE IF:   You have pain that radiates from your back into your legs.   You develop new bowel or bladder control problems.   You have unusual weakness or numbness in your arms or legs.   You develop nausea or vomiting.   You develop abdominal pain.   You feel faint.  Document Released: 08/21/2005 Document Revised: 05/03/2011 Document Reviewed: 01/09/2011 [facility] Patient Information 2012 [place], [place].

## 2011-11-03 NOTE — ED Notes (Signed)
Patient states she has right side back pain and radiates to right side ribs. Patient states the pain is a burning sensation. Patient had a kidney stone blasted on July 12, 2011 and pain began shortly after procedure.

## 2011-11-04 LAB — URINE CULTURE: Culture  Setup Time: 201303011140

## 2011-11-07 ENCOUNTER — Ambulatory Visit: Payer: Medicare Other | Admitting: Gastroenterology

## 2011-12-13 ENCOUNTER — Other Ambulatory Visit: Payer: Self-pay

## 2011-12-13 ENCOUNTER — Emergency Department (HOSPITAL_COMMUNITY)
Admission: EM | Admit: 2011-12-13 | Discharge: 2011-12-14 | Disposition: A | Discharge status: Home / Self Care | Payer: Medicare Other | Attending: Emergency Medicine | Admitting: Emergency Medicine

## 2011-12-13 ENCOUNTER — Encounter (HOSPITAL_COMMUNITY): Payer: Self-pay | Admitting: *Deleted

## 2011-12-13 DIAGNOSIS — R000 Tachycardia, unspecified: Secondary | ICD-10-CM | POA: Diagnosis not present

## 2011-12-13 NOTE — ED Notes (Signed)
States she is feeling flushed and has had several episodes where her heart is beating fast

## 2011-12-13 NOTE — ED Notes (Signed)
States she feels like her heart is coming out of her chest

## 2011-12-19 DIAGNOSIS — M25519 Pain in unspecified shoulder: Secondary | ICD-10-CM | POA: Diagnosis not present

## 2012-02-20 DIAGNOSIS — E039 Hypothyroidism, unspecified: Secondary | ICD-10-CM | POA: Diagnosis not present

## 2012-02-20 DIAGNOSIS — I10 Essential (primary) hypertension: Secondary | ICD-10-CM | POA: Diagnosis not present

## 2012-02-20 DIAGNOSIS — E0590 Thyrotoxicosis, unspecified without thyrotoxic crisis or storm: Secondary | ICD-10-CM | POA: Diagnosis not present

## 2012-02-20 DIAGNOSIS — K219 Gastro-esophageal reflux disease without esophagitis: Secondary | ICD-10-CM | POA: Diagnosis not present

## 2012-02-20 DIAGNOSIS — K589 Irritable bowel syndrome without diarrhea: Secondary | ICD-10-CM | POA: Diagnosis not present

## 2012-02-21 ENCOUNTER — Inpatient Hospital Stay (HOSPITAL_COMMUNITY)
Admission: EM | Admit: 2012-02-21 | Discharge: 2012-02-22 | DRG: 312 | Disposition: A | Discharge status: Home / Self Care | Payer: Medicare Other | Attending: Internal Medicine | Admitting: Internal Medicine

## 2012-02-21 ENCOUNTER — Encounter (HOSPITAL_COMMUNITY): Payer: Self-pay

## 2012-02-21 ENCOUNTER — Emergency Department (HOSPITAL_COMMUNITY): Payer: Medicare Other

## 2012-02-21 DIAGNOSIS — I2510 Atherosclerotic heart disease of native coronary artery without angina pectoris: Secondary | ICD-10-CM | POA: Diagnosis present

## 2012-02-21 DIAGNOSIS — K219 Gastro-esophageal reflux disease without esophagitis: Secondary | ICD-10-CM | POA: Diagnosis present

## 2012-02-21 DIAGNOSIS — R55 Syncope and collapse: Principal | ICD-10-CM | POA: Diagnosis present

## 2012-02-21 DIAGNOSIS — R079 Chest pain, unspecified: Secondary | ICD-10-CM | POA: Diagnosis present

## 2012-02-21 DIAGNOSIS — G8929 Other chronic pain: Secondary | ICD-10-CM | POA: Diagnosis present

## 2012-02-21 DIAGNOSIS — Z853 Personal history of malignant neoplasm of breast: Secondary | ICD-10-CM

## 2012-02-21 DIAGNOSIS — Z9849 Cataract extraction status, unspecified eye: Secondary | ICD-10-CM | POA: Diagnosis not present

## 2012-02-21 DIAGNOSIS — M5030 Other cervical disc degeneration, unspecified cervical region: Secondary | ICD-10-CM | POA: Diagnosis not present

## 2012-02-21 DIAGNOSIS — I10 Essential (primary) hypertension: Secondary | ICD-10-CM

## 2012-02-21 DIAGNOSIS — R0789 Other chest pain: Secondary | ICD-10-CM | POA: Diagnosis not present

## 2012-02-21 DIAGNOSIS — N179 Acute kidney failure, unspecified: Secondary | ICD-10-CM | POA: Diagnosis present

## 2012-02-21 DIAGNOSIS — E860 Dehydration: Secondary | ICD-10-CM | POA: Diagnosis not present

## 2012-02-21 DIAGNOSIS — M1990 Unspecified osteoarthritis, unspecified site: Secondary | ICD-10-CM | POA: Diagnosis present

## 2012-02-21 DIAGNOSIS — R110 Nausea: Secondary | ICD-10-CM | POA: Diagnosis not present

## 2012-02-21 DIAGNOSIS — E890 Postprocedural hypothyroidism: Secondary | ICD-10-CM | POA: Diagnosis present

## 2012-02-21 DIAGNOSIS — K589 Irritable bowel syndrome without diarrhea: Secondary | ICD-10-CM | POA: Diagnosis present

## 2012-02-21 DIAGNOSIS — Z961 Presence of intraocular lens: Secondary | ICD-10-CM | POA: Diagnosis not present

## 2012-02-21 DIAGNOSIS — R404 Transient alteration of awareness: Secondary | ICD-10-CM | POA: Diagnosis not present

## 2012-02-21 DIAGNOSIS — W19XXXA Unspecified fall, initial encounter: Secondary | ICD-10-CM

## 2012-02-21 DIAGNOSIS — M549 Dorsalgia, unspecified: Secondary | ICD-10-CM | POA: Diagnosis present

## 2012-02-21 DIAGNOSIS — Z79899 Other long term (current) drug therapy: Secondary | ICD-10-CM | POA: Diagnosis not present

## 2012-02-21 DIAGNOSIS — M47812 Spondylosis without myelopathy or radiculopathy, cervical region: Secondary | ICD-10-CM | POA: Diagnosis not present

## 2012-02-21 DIAGNOSIS — M810 Age-related osteoporosis without current pathological fracture: Secondary | ICD-10-CM | POA: Diagnosis present

## 2012-02-21 DIAGNOSIS — R0602 Shortness of breath: Secondary | ICD-10-CM | POA: Diagnosis not present

## 2012-02-21 DIAGNOSIS — E039 Hypothyroidism, unspecified: Secondary | ICD-10-CM | POA: Diagnosis present

## 2012-02-21 LAB — URINALYSIS, ROUTINE W REFLEX MICROSCOPIC
Nitrite: NEGATIVE
Specific Gravity, Urine: 1.023 (ref 1.005–1.030)
pH: 5.5 (ref 5.0–8.0)

## 2012-02-21 LAB — CBC
HCT: 38.1 % (ref 36.0–46.0)
MCH: 29.1 pg (ref 26.0–34.0)
MCV: 88.6 fL (ref 78.0–100.0)
RBC: 4.3 MIL/uL (ref 3.87–5.11)
WBC: 14.9 10*3/uL — ABNORMAL HIGH (ref 4.0–10.5)

## 2012-02-21 LAB — POCT I-STAT TROPONIN I: Troponin i, poc: 0.01 ng/mL (ref 0.00–0.08)

## 2012-02-21 LAB — COMPREHENSIVE METABOLIC PANEL
ALT: 11 U/L (ref 0–35)
BUN: 25 mg/dL — ABNORMAL HIGH (ref 6–23)
CO2: 21 mEq/L (ref 19–32)
Calcium: 10.6 mg/dL — ABNORMAL HIGH (ref 8.4–10.5)
Creatinine, Ser: 1.24 mg/dL — ABNORMAL HIGH (ref 0.50–1.10)
GFR calc Af Amer: 48 mL/min — ABNORMAL LOW (ref >90)
GFR calc non Af Amer: 42 mL/min — ABNORMAL LOW (ref >90)
Glucose, Bld: 103 mg/dL — ABNORMAL HIGH (ref 70–99)
Sodium: 139 mEq/L (ref 135–145)

## 2012-02-21 LAB — URINE MICROSCOPIC-ADD ON

## 2012-02-21 LAB — DIFFERENTIAL
Eosinophils Relative: 1 % (ref 0–5)
Lymphocytes Relative: 11 % — ABNORMAL LOW (ref 12–46)
Lymphs Abs: 1.7 10*3/uL (ref 0.7–4.0)
Monocytes Absolute: 1.4 10*3/uL — ABNORMAL HIGH (ref 0.1–1.0)
Monocytes Relative: 10 % (ref 3–12)

## 2012-02-21 MED ORDER — BENAZEPRIL HCL 20 MG PO TABS
20.0000 mg | ORAL_TABLET | Freq: Every day | ORAL | Status: DC
  Filled 2012-02-21: qty 1

## 2012-02-21 MED ORDER — ACETAMINOPHEN 650 MG RE SUPP: 650.0000 mg | Freq: Four times a day (QID) | RECTAL | Status: DC | PRN

## 2012-02-21 MED ORDER — NITROGLYCERIN 0.4 MG SL SUBL
0.4000 mg | SUBLINGUAL_TABLET | SUBLINGUAL | Status: DC | PRN
  Administered 2012-02-21: 0.4 mg via SUBLINGUAL

## 2012-02-21 MED ORDER — ONDANSETRON HCL 4 MG PO TABS: 4.0000 mg | ORAL_TABLET | Freq: Four times a day (QID) | ORAL | Status: DC | PRN

## 2012-02-21 MED ORDER — SODIUM CHLORIDE 0.9 % IV SOLN
INTRAVENOUS | Status: AC
  Administered 2012-02-21: 19:00:00 via INTRAVENOUS

## 2012-02-21 MED ORDER — SODIUM CHLORIDE 0.9 % IJ SOLN
3.0000 mL | Freq: Two times a day (BID) | INTRAMUSCULAR | Status: DC
  Administered 2012-02-21 – 2012-02-22 (×2): 3 mL via INTRAVENOUS

## 2012-02-21 MED ORDER — ALUM & MAG HYDROXIDE-SIMETH 200-200-20 MG/5ML PO SUSP: 30.0000 mL | Freq: Four times a day (QID) | ORAL | Status: DC | PRN

## 2012-02-21 MED ORDER — ONDANSETRON HCL 4 MG/2ML IJ SOLN
INTRAMUSCULAR | Status: AC
  Filled 2012-02-21: qty 2

## 2012-02-21 MED ORDER — AMLODIPINE BESY-BENAZEPRIL HCL 5-20 MG PO CAPS: 1.0000 | ORAL_CAPSULE | Freq: Every day | ORAL | Status: DC

## 2012-02-21 MED ORDER — ASPIRIN EC 81 MG PO TBEC
81.0000 mg | DELAYED_RELEASE_TABLET | Freq: Every day | ORAL | Status: DC
  Administered 2012-02-22: 81 mg via ORAL
  Filled 2012-02-21: qty 1

## 2012-02-21 MED ORDER — PANTOPRAZOLE SODIUM 40 MG PO TBEC
80.0000 mg | DELAYED_RELEASE_TABLET | Freq: Every day | ORAL | Status: DC
  Administered 2012-02-22: 80 mg via ORAL
  Filled 2012-02-21: qty 2

## 2012-02-21 MED ORDER — SODIUM CHLORIDE 0.9 % IV BOLUS (SEPSIS)
1000.0000 mL | Freq: Once | INTRAVENOUS | Status: AC
  Administered 2012-02-21: 1000 mL via INTRAVENOUS

## 2012-02-21 MED ORDER — ONDANSETRON HCL 4 MG/2ML IJ SOLN: 4.0000 mg | Freq: Four times a day (QID) | INTRAMUSCULAR | Status: DC | PRN

## 2012-02-21 MED ORDER — ENOXAPARIN SODIUM 40 MG/0.4ML ~~LOC~~ SOLN
40.0000 mg | SUBCUTANEOUS | Status: DC
  Administered 2012-02-21: 40 mg via SUBCUTANEOUS
  Filled 2012-02-21 (×2): qty 0.4

## 2012-02-21 MED ORDER — LEVOTHYROXINE SODIUM 100 MCG PO TABS
100.0000 ug | ORAL_TABLET | Freq: Every day | ORAL | Status: DC
  Administered 2012-02-22: 100 ug via ORAL
  Filled 2012-02-21 (×2): qty 1

## 2012-02-21 MED ORDER — ALBUTEROL SULFATE (5 MG/ML) 0.5% IN NEBU: 2.5000 mg | INHALATION_SOLUTION | RESPIRATORY_TRACT | Status: DC | PRN

## 2012-02-21 MED ORDER — ACETAMINOPHEN 325 MG PO TABS
650.0000 mg | ORAL_TABLET | Freq: Four times a day (QID) | ORAL | Status: DC | PRN
  Administered 2012-02-21 – 2012-02-22 (×2): 650 mg via ORAL
  Filled 2012-02-21 (×2): qty 2

## 2012-02-21 MED ORDER — AMLODIPINE BESYLATE 5 MG PO TABS
5.0000 mg | ORAL_TABLET | Freq: Every day | ORAL | Status: DC
  Administered 2012-02-22: 5 mg via ORAL
  Filled 2012-02-21: qty 1

## 2012-02-21 MED ORDER — ADULT MULTIVITAMIN W/MINERALS CH
1.0000 | ORAL_TABLET | Freq: Every evening | ORAL | Status: DC
  Filled 2012-02-21: qty 1

## 2012-02-21 NOTE — ED Provider Notes (Signed)
I saw and examined pt for medical screening. Pt after a syncopal episode after having a bowel movement today. Pt states she was having severe cramping in lower abdomen, got dizzy while on a toilet, tried to get up to get help and synopsized. Pt reports chronic abdominal problems, 50lb weight loss in the last few months that is un intentional. Pt denies sob, chest pain, n/v.  Exam: Pt in nad, vs norma except ofr bp of 103/50. Pt has no focal neurodeficits. She is AAOx3. Lungs clear on ausculation, hr regular, and regular rhythm, abdomen tender in the suprapubic area.   Labs and CT head ordered. Will monitor.   Lottie Mussel, PA 02/21/12 1453

## 2012-02-21 NOTE — H&P (Addendum)
PCP:   Dwana Melena, MD   Chief Complaint:  Passed out and chest pressure.  HPI: 77 year old Caucasian female patient with history of hypertension, post thyroidectomy hypothyroidism, prior episodes of chest pain, negative cardiac cath in 2003 and negative stress test in 2009, noncardiac syncope, nisin fundoplication, GERD, irritable bowel syndrome was in her usual state of health until approximately 1 PM this afternoon. She had lunch with her granddaughter at [employer]. Approximately 20 minutes later she started experiencing lower abdominal cramps. She indicates that on most occasions (75% of the time), she has similar symptoms with subsequent diarrhea after meal secondary to IBS. They were driving to [facility]. On reaching there she went to use the bathroom. Apart from the severe cramping lower abdominal pain, she felt cold, clammy, sweaty, dizzy and had a watery BM. She called out for help but no one heard her. She got up and got out of the toilet and passed out. Her granddaughter who was standing outside held her and decrease the impact of the fall. She said to have hit her head to the sink. Within a brief moment she regained consciousness. There was no bleeding. She does not recall having chest pain at that time. No asymmetrical weakness or slurred speech or facial asymmetry. She use the toilet 2 more times. EMS brought her to the emergency department. Sometime during this course she started complaining of lower substernal heaviness/pressure-like sensation, rated 5-6/10 which is nonradiating.?? Associated dyspnea. She however indicates that this chest pressure seems to get worse on sitting up or moving her chest wall and even to touch. No cough. CT of the head and neck were negative. Creatinine was mildly elevated. EKG did not show acute changes. The hospitalist service is requested to admit for further evaluation and management.  Past Medical History: Past Medical History  Diagnosis Date  . History of  chest pain     MI ruled out  . Non-occlusive coronary artery disease 2003    by Catheterization  . History of hiatal hernia   . History of esophageal spasm   . Irritable bowel syndrome   . Breast cancer 2003  . Syncope, non cardiac   . Osteoarthritis   . Diverticulitis     History  . HTN (hypertension)   . Hypokalemia   . Kidney stones   . Osteoporosis 09/08/2011  . Hypothyroidism   . Dyslexia     Past Surgical History: Past Surgical History  Procedure Date  . Thyroidectomy     Hypothyroidism status post  . Cholecystectomy 1997    Status post -stones  . Laparoscopic nissen fundoplication 1997  . Arm fx     left  . Breast lumpectomy     Left  . Knee surgery   . Kidney stone surgery 1992  . Mandible reconstruction   . Cataract extraction   . Neck surgery 2000    [facility]  . Cataract extraction w/phaco 10/02/2011    Procedure: CATARACT EXTRACTION PHACO AND INTRAOCULAR LENS PLACEMENT (IOC);  Surgeon: Gemma Payor, MD;  Location: [facility];  Service: Ophthalmology;  Laterality: Right;  CDE:17.01    Allergies:   Allergies  Allergen Reactions  . Metronidazole Other (See Comments)    No taste in mouth and lost 32 lbs  . Oxycodone Other (See Comments)     delusions  . Ciprofloxacin Rash    Medications: Prior to Admission medications   Medication Sig Start Date End Date Taking? Authorizing Provider  amLODipine-benazepril (LOTREL) 5-20 MG per capsule Take 1  capsule by mouth daily.     Yes Historical Provider, MD  esomeprazole (NEXIUM) 40 MG capsule Take 40 mg by mouth daily.    Yes Historical Provider, MD  levothyroxine (SYNTHROID, LEVOTHROID) 100 MCG tablet Take 100 mcg by mouth daily.   Yes Historical Provider, MD  Multiple Vitamin (MULTIVITAMIN WITH MINERALS) TABS Take 1 tablet by mouth every evening.   Yes Historical Provider, MD    Family History: Family History  Problem Relation Age of Onset  . Anesthesia problems Neg Hx   . Hypotension Neg Hx   . Pseudochol  deficiency Neg Hx   . Malignant hyperthermia Neg Hx     Social History:  reports that she has never smoked. She does not have any smokeless tobacco history on file. She reports that she drinks alcohol. She reports that she does not use illicit drugs.  Patient is married but is in the process of getting a divorce. She is independent of activities of daily living. Never smoked.  Review of Systems:  All systems reviewed and apart from history of presenting illness, pertinent for chronic neck and mid back pain which seemed to get worse when she does activity with her upper extremities. She has a frozen left shoulder. She is lost 25 pounds of weight over the last 6 months to 8 months. She attributes this to lack of taste which apparently happened during the hospitalization in November for kidney stones. Does not recall having the EGD done. On the left mastectomy site pain.  Physical Exam: Filed Vitals:   02/21/12 1647 02/21/12 1715 02/21/12 1730 02/21/12 1745  BP:  98/48 112/64 127/63  Pulse: 104 68 71 77  Temp:      TempSrc:      Resp:  17 22 18   Height:      Weight:      SpO2:  95% 95% 97%   General appearance: Moderately built and nourished female patient who is lying comfortably in the gurney and is in no obvious distress.  Head: Nontraumatic and normocephalic.  Eyes: Pupils equally reacting to light and accommodation.  Ears: Normal  Nose: No acute findings. No sinus tenderness.  Throat: Mucosa is mildly dry. No oral thrush.  Neck: Supple. No JVD or carotid bruit. Anterior neck horizontal surgical scar. Vertical Surgical scar in the midline the back of the neck 2. Lymph nodes: No lymphadenopathy.  Resp: Clear to auscultation. No increased work of breathing. Mildly tender in the right infrascapular region (not new apparently) Cardio: First and second heart sounds heard, regular rate and rythm. No murmurs or JVD or gallop or pedal edema.   GI: Non distended. Soft. No organomegaly or  masses appreciated. Normal bowel sounds heard. ? Intermittent tenderness in the left lower quadrant but no rigidity, guarding or rebound. Extremities: symmetric 5/5 power. Skin: No other acute findings.  Neurologic: Alert and oriented. No focal neurological deficits. Breasts: Not examined. Patient does have history of left lumpectomy.   Labs on Admission:   [facility] 02/21/12 1455  NA 139  K 3.9  CL 105  CO2 21  GLUCOSE 103*  BUN 25*  CREATININE 1.24*  CALCIUM 10.6*  MG --  PHOS --    Basename 02/21/12 1455  AST 23  ALT 11  ALKPHOS 49  BILITOT 0.2*  PROT 6.9  ALBUMIN 3.5   No results found for this basename: LIPASE:2,AMYLASE:2 in the last 72 hours  Basename 02/21/12 1455  WBC 14.9*  NEUTROABS 11.6*  HGB 12.5  HCT 38.1  MCV 88.6  PLT 232   No results found for this basename: CKTOTAL:3,CKMB:3,CKMBINDEX:3,TROPONINI:3 in the last 72 hours No results found for this basename: TSH,T4TOTAL,FREET3,T3FREE,THYROIDAB in the last 72 hours No results found for this basename: VITAMINB12:2,FOLATE:2,FERRITIN:2,TIBC:2,IRON:2,RETICCTPCT:2 in the last 72 hours  Radiological Exams on Admission: Dg Chest 2 View  02/21/2012  *RADIOLOGY REPORT*  Clinical Data: History of chest pressure and pain.  Syncopal episode.  Hypertension.  History of previous breast carcinoma.  CHEST - 2 VIEW  Comparison: 06/03/2011.  Findings: Cardiac silhouette is now in the normal range of size. Ectasia and tortuosity of thoracic aorta is present.  Hilar and mediastinal contours appear stable.  There is a hiatal hernia in the retrocardiac region.  There is linear atelectasis or fibrosis in the left lung base.  No pulmonary edema, pneumonia, or pleural effusion is evident.  There is evidence of previous left axillary surgery and cholecystectomy.  There is osteopenic appearance of bones.  Changes of degenerative disc disease and degenerative spondylosis are present.  IMPRESSION: Cardiac silhouette now normal size.   Hiatal hernia.  Linear atelectasis or fibrosis in left lung base.  Post left axillary surgery.  Post cholecystectomy.  Original Report Authenticated By: Crawford Givens, M.D.   Ct Head Wo Contrast  02/21/2012  *RADIOLOGY REPORT*  Clinical Data:  Loss of consciousness.  Chest pain.  CT HEAD WITHOUT CONTRAST CT CERVICAL SPINE WITHOUT CONTRAST  Technique:  Multidetector CT imaging of the head and cervical spine was performed following the standard protocol without intravenous contrast.  Multiplanar CT image reconstructions of the cervical spine were also generated.  Comparison:  MRI of the cervical spine 09/28/2004.  CT HEAD  Findings: No acute cortical infarct, hemorrhage, mass lesion is present.  Scattered areas of periventricular subcortical white matter hypoattenuation are present bilaterally.  The ventricles are proportionate to the degree of atrophy.  There is no significant extra-axial fluid collection.  Dense atherosclerotic calcifications are present in the cavernous carotid arteries and at the dural margin of the vertebral arteries bilaterally.  The paranasal sinuses and mastoid air cells are clear.  No significant extracranial soft tissue injury is evident.  IMPRESSION:  1.  Mild atrophy white matter disease.  This likely reflects the sequelae of chronic microvascular ischemia. 2.  Atherosclerosis. 3.  No acute intracranial abnormality.  CT CERVICAL SPINE  Findings: The cervical spine is imaged from skull base through T2- 3.  The vertebral body heights and alignment are maintained.  No acute fracture or traumatic subluxation is evident.  Chronic end plate degenerative changes and disc osteophyte complexes are present at C5-6 and C6-7.  There is a left lamina defect at C5 which may be post-traumatic postsurgical or potentially congenital.  The lung apices demonstrate emphysema. Dense calcifications are present at the dural margin of the vertebral arteries bilaterally.  IMPRESSION:  1.  Moderate spondylosis of  the lower cervical spine. 2.  No acute fracture or traumatic subluxation. 3.  Left lamina defect at C5 is likely postsurgical.  Original Report Authenticated By: Jamesetta Orleans. MATTERN, M.D.    EKG: Sinus rhythm at 87 beats per minute, left axis deviation, Q waves in lead 3 and aVF. No acute changes. QTC 423 ms. No significant change compared to EKG of 12/13/11     Assessment/Plan Present on Admission:  .Syncope and collapse .Chest pain .Dehydration .Acute renal failure .HTN (hypertension) .Hypothyroid .GERD (gastroesophageal reflux disease)  1. Syncope and collapse: Highly suspicious for vasovagal etiology. Admit to telemetry. Cycle cardiac enzymes. Check 2-D  echo and carotid Dopplers. Check orthostatic blood pressures. Aspirin. 2. Chest discomfort: Seems musculoskeletal/atypical in nature. No acute EKG changes. Troponins x1 negative. Monitor on telemetry. Cycle cardiac enzymes. When necessary sublingual nitrates. Monitor. 3. Mild dehydration: IV fluids. 4. Mild acute renal failure/prerenal azotemia: May also be secondary to ACE inhibitors. Brief IV fluids and monitor. 5. Hypertension: Currently soft blood pressures. IV fluids and monitor. 6. Hypothyroidism: Continue Synthroid. 7. History of irritable bowel syndrome and urgency: Colonoscopy in the last couple of years showed diverticulosis. May consider EGD given history of profound weight loss. This is outpatient. 8. Chronic back pain. 9. GERD: Continue PPIs. 10. Full code. This was confirmed in the presence of patients daughter/health care power of attorney Ms. Bebe Shaggy.   Mavrik Bynum 02/21/2012, 6:29 PM

## 2012-02-21 NOTE — ED Provider Notes (Signed)
History     CSN: [id number]  Arrival date & time 02/21/12  1340   First MD Initiated Contact with Patient 02/21/12 1457      Chief Complaint  Patient presents with  . Loss of Consciousness  . Chest Pain    Patient is a 77 y.o. female presenting with syncope. The history is provided by the patient and a relative.  Loss of Consciousness This is a new problem. The current episode started today (approx 30 mins PTA). Episode frequency: once. The problem has been resolved. Associated symptoms include abdominal pain (diffuse, crampy pain similar to other episodes of IBS), chest pain (left-sided ), headaches (unchanged from baseline), neck pain (unchanged from baseline) and weakness (generalized, right before fall; resolved after the event). Pertinent negatives include no anorexia, arthralgias, chills, coughing, diaphoresis, fatigue, fever, nausea, numbness, rash, vertigo, visual change or vomiting. Exacerbated by: pt had just had a bowel movement. She has tried nothing for the symptoms.    Past Medical History  Diagnosis Date  . History of chest pain     MI ruled out  . Non-occlusive coronary artery disease 2003    by Catheterization  . History of hiatal hernia   . History of esophageal spasm   . Irritable bowel syndrome   . Breast cancer 2003  . Syncope, non cardiac   . Osteoarthritis   . Diverticulitis     History  . HTN (hypertension)   . Hypokalemia   . Kidney stones   . Osteoporosis 09/08/2011  . Hypothyroidism   . Dyslexia     Past Surgical History  Procedure Date  . Thyroidectomy     Hypothyroidism status post  . Cholecystectomy 1997    Status post -stones  . Laparoscopic nissen fundoplication 1997  . Arm fx     left  . Breast lumpectomy     Left  . Knee surgery   . Kidney stone surgery 1992  . Mandible reconstruction   . Cataract extraction   . Neck surgery 2000    [facility]  . Cataract extraction w/phaco 10/02/2011    Procedure: CATARACT EXTRACTION PHACO AND  INTRAOCULAR LENS PLACEMENT (IOC);  Surgeon: Gemma Payor, MD;  Location: [facility];  Service: Ophthalmology;  Laterality: Right;  CDE:17.01    Family History  Problem Relation Age of Onset  . Anesthesia problems Neg Hx   . Hypotension Neg Hx   . Pseudochol deficiency Neg Hx   . Malignant hyperthermia Neg Hx     History  Substance Use Topics  . Smoking status: Never Smoker   . Smokeless tobacco: Not on file  . Alcohol Use: Yes     occasionally    OB History    Grav Para Term Preterm Abortions TAB SAB Ect Mult Living                  Review of Systems  Constitutional: Negative for fever, chills, diaphoresis, activity change, appetite change and fatigue.  HENT: Positive for neck pain (unchanged from baseline). Negative for neck stiffness.   Respiratory: Positive for chest tightness. Negative for cough, shortness of breath and wheezing.   Cardiovascular: Positive for chest pain (left-sided ) and syncope. Negative for palpitations.  Gastrointestinal: Positive for abdominal pain (diffuse, crampy pain similar to other episodes of IBS). Negative for nausea, vomiting, diarrhea, constipation, blood in stool, abdominal distention, anal bleeding and anorexia.  Genitourinary: Negative for dysuria, decreased urine volume and difficulty urinating.  Musculoskeletal: Negative for arthralgias.  Skin: Negative for  rash and wound.  Neurological: Positive for syncope, weakness (generalized, right before fall; resolved after the event) and headaches (unchanged from baseline). Negative for dizziness, vertigo, tremors, seizures, facial asymmetry, speech difficulty, light-headedness and numbness.  Psychiatric/Behavioral: Negative for confusion and agitation.  All other systems reviewed and are negative.    Allergies  Metronidazole; Oxycodone; and Ciprofloxacin  Home Medications   Current Outpatient Rx  Name Route Sig Dispense Refill  . AMLODIPINE BESY-BENAZEPRIL HCL 5-20 MG PO CAPS Oral Take 1  capsule by mouth daily.      Marland Kitchen ESOMEPRAZOLE MAGNESIUM 40 MG PO CPDR Oral Take 40 mg by mouth daily.     Marland Kitchen LEVOTHYROXINE SODIUM 100 MCG PO TABS Oral Take 100 mcg by mouth daily.    . ADULT MULTIVITAMIN W/MINERALS CH Oral Take 1 tablet by mouth every evening.      Ht 5\' 6"  (1.676 m)  Wt 154 lb (69.854 kg)  BMI 24.86 kg/m2  Physical Exam  Nursing note and vitals reviewed. Constitutional: She is oriented to person, place, and time. She appears well-developed and well-nourished.  HENT:  Head: Normocephalic and atraumatic.  Right Ear: External ear normal.  Left Ear: External ear normal.  Nose: Nose normal.  Mouth/Throat: Oropharynx is clear and moist. No oropharyngeal exudate.       Well-healed horizontal scar overlying anterior neck   Eyes: Conjunctivae are normal. Pupils are equal, round, and reactive to light.  Neck: Normal range of motion. Neck supple.  Cardiovascular: Normal rate, regular rhythm, normal heart sounds and intact distal pulses.  Exam reveals no gallop and no friction rub.   No murmur heard. Pulmonary/Chest: Effort normal and breath sounds normal. No respiratory distress. She has no wheezes. She has no rales. She exhibits tenderness (overlying left chest, at site of mastectomy; unchanged from baseline).  Abdominal: Soft. Bowel sounds are normal. She exhibits no distension and no mass. There is no tenderness. There is no rebound and no guarding.  Musculoskeletal: Normal range of motion. She exhibits no edema and no tenderness.  Neurological: She is alert and oriented to person, place, and time. She has normal reflexes. She displays normal reflexes. No cranial nerve deficit. She exhibits normal muscle tone. Coordination normal.  Skin: Skin is warm and dry. No rash noted. No erythema. No pallor.  Psychiatric: She has a normal mood and affect. Her behavior is normal. Judgment and thought content normal.    ED Course  Procedures (including critical care time)   Labs  Reviewed  CBC  DIFFERENTIAL  COMPREHENSIVE METABOLIC PANEL  URINALYSIS, ROUTINE W REFLEX MICROSCOPIC   No results found.   1. Syncope   2. Fall   3. Chest pain      Date: 02/21/2012  Rate: 87 bpm  Rhythm: normal sinus rhythm  QRS Axis: left  Intervals: normal  ST/T Wave abnormalities: normal  Conduction Disutrbances:none  Narrative Interpretation: No evidence of acute ischemia or arrythmia   Old EKG Reviewed: unchanged (12/13/11)    MDM  77 yo F presents immediately after after witnessed syncopal event after having a bowel movement. She fell to the ground and hit her head, but her fall was slowed by her grand-daughter, who witnessed the episode. Prodromal light-headedness, dyspnea, headache, and abdominal pain. Pt now complaining of left-sided chest heaviness. Mild TTP overlying cervical spine. ASA (324mg  PO) given by EMS PTA. Will obtain head and c-spine CT's to rule out intracranial bleed or injury to the cervical spine. Clinical picture not concerning for PE as cause  of syncope in this pt who is not tachycardic or  hypoxic and without history of VTE. LP not indicated to rule out SAH in this pt with syncope and onset of headache <3 hrs prior to head CT.   EKG without evidence of acute ischemia or arrythmia. Labs and orthostatics consistent with dehydration; IVF provided. Labs also consistent with borderline acute renal failure, likely secondary to dehydration. Patient states chest pain resolved after IVF. Hospitalist service consulted and will admit.         Clemetine Marker, MD 02/21/12 [phone]

## 2012-02-21 NOTE — ED Provider Notes (Signed)
Medical screening examination/treatment/procedure(s) were performed by non-physician practitioner and as supervising physician I was immediately available for consultation/collaboration.  Azan Maneri, MD 02/21/12 1543 

## 2012-02-21 NOTE — ED Notes (Signed)
Pt was brought in by ambulance with complaint of syncopal episode while at [facility] and chest discomfort. Pt was given 324 mg of ASA and 4 mg of Zofran for nausea. Pt is A/A/Ox4, skin is warm and dry, respiration is even and unlabored.

## 2012-02-22 ENCOUNTER — Encounter (HOSPITAL_COMMUNITY): Payer: Self-pay | Admitting: General Practice

## 2012-02-22 DIAGNOSIS — N179 Acute kidney failure, unspecified: Secondary | ICD-10-CM | POA: Diagnosis not present

## 2012-02-22 DIAGNOSIS — R55 Syncope and collapse: Secondary | ICD-10-CM

## 2012-02-22 DIAGNOSIS — E860 Dehydration: Secondary | ICD-10-CM | POA: Diagnosis not present

## 2012-02-22 DIAGNOSIS — R079 Chest pain, unspecified: Secondary | ICD-10-CM

## 2012-02-22 LAB — BASIC METABOLIC PANEL
CO2: 23 mEq/L (ref 19–32)
Chloride: 109 mEq/L (ref 96–112)
GFR calc non Af Amer: 53 mL/min — ABNORMAL LOW (ref >90)
Glucose, Bld: 96 mg/dL (ref 70–99)
Potassium: 3.8 mEq/L (ref 3.5–5.1)
Sodium: 139 mEq/L (ref 135–145)

## 2012-02-22 LAB — CARDIAC PANEL(CRET KIN+CKTOT+MB+TROPI)
Relative Index: INVALID (ref 0.0–2.5)
Relative Index: INVALID (ref 0.0–2.5)
Troponin I: <0.3 ng/mL (ref <0.30)

## 2012-02-22 LAB — CBC
Hemoglobin: 10.4 g/dL — ABNORMAL LOW (ref 12.0–15.0)
RBC: 3.6 MIL/uL — ABNORMAL LOW (ref 3.87–5.11)

## 2012-02-22 NOTE — ED Provider Notes (Signed)
I saw and evaluated the patient, reviewed the resident's note and I agree with the findings and plan and agree with their ECG interpretation. Patient with chest pain and syncope. No clear injury from the fall. Negative CTs. Patient was admitted to medicine for further evaluation  Juliet Rude. Rubin Payor, MD 02/22/12 2351

## 2012-02-22 NOTE — Progress Notes (Signed)
VASCULAR LAB PRELIMINARY  PRELIMINARY  PRELIMINARY  PRELIMINARY  Carotid duplex completed.    Preliminary report:  Bilateral:  No evidence of hemodynamically significant internal carotid artery stenosis.   Vertebral artery flow is antegrade.     [name], RVT 02/22/2012, 12:55 PM

## 2012-02-22 NOTE — Progress Notes (Signed)
  Echocardiogram 2D Echocardiogram has been performed.  [name] FRANCES 02/22/2012, 12:30 PM

## 2012-02-22 NOTE — Progress Notes (Signed)
UR Completed.  Vangie Bicker [phone] 02/22/2012

## 2012-02-22 NOTE — Progress Notes (Signed)
Pt discharge instructions and patient education complete. IV site d.c. Site WNL. No further questions. Pt d/c home with family. [name]  

## 2012-02-22 NOTE — Discharge Summary (Signed)
Discharge Summary  AI SONNENFELD MR#: [id number]  DOB:September 21, 1934  Date of Admission: 02/21/2012 Date of Discharge: 02/22/2012  Patient's PCP: Dwana Melena, MD  Attending Physician:Kahlyn Shippey  Consults: None.    Discharge Diagnoses: Principal Problem:  *Syncope and collapse Active Problems:  Chest pain  Dehydration  Acute renal failure  HTN (hypertension)  Hypothyroid  GERD (gastroesophageal reflux disease)   Brief Admitting History and Physical 77 year old Caucasian female patient with history of hypertension, post thyroidectomy hypothyroidism, prior episodes of chest pain, negative cardiac cath in 2003 and negative stress test in 2009, noncardiac syncope, nisin fundoplication, GERD, irritable bowel syndrome, left breast lumpectomy for breast cancer, presented on 6/19 with an episode of passing out after cramping lower abdominal pain and BMs which is usual for her following meals due to her IBS. She denied prior episodes of syncope. She also complained of some chest discomfort which was atypical for angina. She was admitted for further evaluation and management.   Discharge Medications Current Discharge Medication List    CONTINUE these medications which have NOT CHANGED   Details  amLODipine-benazepril (LOTREL) 5-20 MG per capsule Take 1 capsule by mouth daily.      esomeprazole (NEXIUM) 40 MG capsule Take 40 mg by mouth daily.     levothyroxine (SYNTHROID, LEVOTHROID) 100 MCG tablet Take 100 mcg by mouth daily.    Multiple Vitamin (MULTIVITAMIN WITH MINERALS) TABS Take 1 tablet by mouth every evening.        Hospital Course: Syncope and collapse Present on Admission:  .Syncope and collapse .Chest pain .Dehydration .Acute renal failure .HTN (hypertension) .Hypothyroid .GERD (gastroesophageal reflux disease)   1. Syncope and collapse: Highly suspicious for vasovagal etiology. Patient was admitted to telemetry which did not show any arrhythmia alarms. 2-D echo  and carotid Dopplers were negative for etiology. Cardiac enzymes were cycled and negative. Patient did not have orthostatic changes. She denies any dizziness or lightheadedness or any new complaints. 2. Chest discomfort: Seems musculoskeletal/atypical in nature. No acute EKG changes. Cardiac enzymes were cycled and negative. May consider starting low dose aspirin as outpatient if there are no contraindications-defer to patient's primary care physician.. 3. Mild dehydration: Briefly hydrated with IV fluids. Resolved. 4. Mild acute renal failure/prerenal azotemia: May also be secondary to ACE inhibitors. Resolved with IV fluids.. 5. Hypertension: Continue home medications. 6. Hypothyroidism: Continue Synthroid. 7. History of irritable bowel syndrome and urgency: Colonoscopy in the last couple of years showed diverticulosis. May consider EGD given history of profound weight loss.  8. Chronic back pain. 9. GERD: Continue PPIs. 10. Full code. This was confirmed in the presence of patients daughter/health care power of attorney Ms. Vernona Rieger Clou  Day of Discharge  Complaints: Chronic back pain. No dizziness or lightheadedness or chest pain or palpitations or dyspnea.  Physical exam: BP 100/64  Pulse 58  Temp 98.5 F (36.9 C) (Oral)  Resp 18  Ht 5\' 6"  (1.676 m)  Wt 69.854 kg (154 lb)  BMI 24.86 kg/m2  SpO2 95% General exam: Comfortable. Respiratory system: Clear to auscultation. No increased work of breathing. Cardiovascular system: First and second heart sounds heard, regular rate and rhythm. No JVD or murmurs or pedal edema. Telemetry shows sinus rhythm without arrhythmia alarms. Gastrointestinal system: Abdomen is nondistended, soft and nontender and normal bowel sounds are heard. Central nervous system: Alert and oriented. No focal neurological deficits. Extremities: Symmetric 5 x 5 power.   Basic Metabolic Panel:  Lab 02/22/12 7829 02/21/12 1455  NA 139 139  K 3.8  3.9  CL 109 105    CO2 23 21  GLUCOSE 96 103*  BUN 23 25*  CREATININE 1.02 1.24*  CALCIUM 9.4 10.6*  ALB -- --  PHOS -- --   Liver Function Tests:  Lab 02/21/12 1455  AST 23  ALT 11  ALKPHOS 49  BILITOT 0.2*  PROT 6.9  ALBUMIN 3.5   No results found for this basename: LIPASE:3,AMYLASE:3 in the last 168 hours No results found for this basename: AMMONIA:3 in the last 168 hours CBC:  Lab 02/22/12 0237 02/21/12 1455  WBC 8.3 14.9*  NEUTROABS -- 11.6*  HGB 10.4* 12.5  HCT 32.1* 38.1  MCV 89.2 88.6  PLT 193 232   Cardiac Enzymes:  Lab 02/22/12 1058 02/22/12 0234 02/21/12 1928  CKTOTAL 49 54 63  CKMB 1.8 2.2 2.6  CKMBINDEX -- -- --  TROPONINI <0.30 <0.30 <0.30   CBG: No results found for this basename: GLUCAP:5 in the last 168 hours  Other lab data:  1. Urine analysis showed 7-10 white blood cells and few bacteria and few squamous epithelial cells. 2.  Radiological Exams on Admission:   Dg Chest 2 View   02/21/2012 *RADIOLOGY REPORT* Clinical Data: History of chest pressure and pain. Syncopal episode. Hypertension. History of previous breast carcinoma. CHEST - 2 VIEW Comparison: 06/03/2011. Findings: Cardiac silhouette is now in the normal range of size. Ectasia and tortuosity of thoracic aorta is present. Hilar and mediastinal contours appear stable. There is a hiatal hernia in the retrocardiac region. There is linear atelectasis or fibrosis in the left lung base. No pulmonary edema, pneumonia, or pleural effusion is evident. There is evidence of previous left axillary surgery and cholecystectomy. There is osteopenic appearance of bones. Changes of degenerative disc disease and degenerative spondylosis are present. IMPRESSION: Cardiac silhouette now normal size. Hiatal hernia. Linear atelectasis or fibrosis in left lung base. Post left axillary surgery. Post cholecystectomy. Original Report Authenticated By: Crawford Givens, M.D.    Ct Head Wo Contrast   02/21/2012 *RADIOLOGY REPORT* Clinical  Data: Loss of consciousness. Chest pain. CT HEAD WITHOUT CONTRAST CT CERVICAL SPINE WITHOUT CONTRAST Technique: Multidetector CT imaging of the head and cervical spine was performed following the standard protocol without intravenous contrast. Multiplanar CT image reconstructions of the cervical spine were also generated. Comparison: MRI of the cervical spine 09/28/2004. CT HEAD Findings: No acute cortical infarct, hemorrhage, mass lesion is present. Scattered areas of periventricular subcortical white matter hypoattenuation are present bilaterally. The ventricles are proportionate to the degree of atrophy. There is no significant extra-axial fluid collection. Dense atherosclerotic calcifications are present in the cavernous carotid arteries and at the dural margin of the vertebral arteries bilaterally. The paranasal sinuses and mastoid air cells are clear. No significant extracranial soft tissue injury is evident. IMPRESSION: 1. Mild atrophy white matter disease. This likely reflects the sequelae of chronic microvascular ischemia. 2. Atherosclerosis. 3. No acute intracranial abnormality. CT CERVICAL SPINE Findings: The cervical spine is imaged from skull base through T2- 3. The vertebral body heights and alignment are maintained. No acute fracture or traumatic subluxation is evident. Chronic end plate degenerative changes and disc osteophyte complexes are present at C5-6 and C6-7. There is a left lamina defect at C5 which may be post-traumatic postsurgical or potentially congenital. The lung apices demonstrate emphysema. Dense calcifications are present at the dural margin of the vertebral arteries bilaterally. IMPRESSION: 1. Moderate spondylosis of the lower cervical spine. 2. No acute fracture or traumatic subluxation. 3. Left lamina defect  at C5 is likely postsurgical. Original Report Authenticated By: Jamesetta Orleans. MATTERN, M.D.   2-D echocardiogram:  Study Conclusions  - Left ventricle: The cavity size  was normal. Wall thickness was normal. Systolic function was normal. The estimated ejection fraction was in the range of 55% to 60%. Wall motion was normal; there were no regional wall motion abnormalities. Doppler parameters are consistent with abnormal left ventricular relaxation (grade 1 diastolic dysfunction). - Aortic valve: There was no stenosis. - Mitral valve: Trivial regurgitation. - Left atrium: The atrium was mildly dilated. - Right ventricle: The cavity size was normal. Systolic function was normal. - Right atrium: The atrium was mildly dilated. - Tricuspid valve: Peak RV-RA gradient: 27mm Hg (S). - Pulmonary arteries: PA systolic pressure 33-37 mmHg. - Systemic veins: IVC measured 2.0 cm with normal respirophasic variation, suggesting RA pressure 6-10 mmHg.  Impressions:  - Normal LV size and systolic function, EF 55-60%. Mild biatrial enlargement. Normal RV size and systolic function. Borderline pulmonary hypertension.  Carotid duplex    Preliminary report: Bilateral: No evidence of hemodynamically significant internal carotid artery stenosis. Vertebral artery flow is antegrade.    EKG: Sinus rhythm at 87 beats per minute, left axis deviation, Q waves in lead 3 and aVF. No acute changes. QTC 423 ms. No significant change compared to EKG of 12/13/11   Disposition: Discharged home in stable condition.  Diet: Heart healthy.  Activity: Increase activity gradually.  Follow-up Appts: Discharge Orders    Future Appointments: Provider: Department: Dept Phone: Center:   03/05/2012 10:30 AM Randall An, MD [facility] [phone] None   09/06/2012 11:30 AM [facility] [phone] None   09/06/2012 11:45 AM [facility] [phone] None     Future Orders Please Complete By Expires   Diet - low sodium heart healthy      Increase activity slowly      Call MD for:  severe uncontrolled pain      Call MD for:  persistant dizziness or  light-headedness      Call MD for:  extreme fatigue         TESTS THAT NEED FOLLOW-UP Repeat CBC in one week from hospital discharge.  Time spent on discharge, talking to the patient, and coordinating care: 20 mins.   SignedMarcellus Scott, MD 02/22/2012, 3:19 PM

## 2012-03-05 ENCOUNTER — Encounter (HOSPITAL_COMMUNITY): Payer: Medicare Other | Attending: Oncology | Admitting: Oncology

## 2012-03-05 ENCOUNTER — Encounter (HOSPITAL_COMMUNITY): Payer: Self-pay | Admitting: Oncology

## 2012-03-05 VITALS — BP 124/71 | HR 72 | Temp 98.1°F | Wt 154.9 lb

## 2012-03-05 DIAGNOSIS — C50919 Malignant neoplasm of unspecified site of unspecified female breast: Secondary | ICD-10-CM

## 2012-03-05 DIAGNOSIS — Z853 Personal history of malignant neoplasm of breast: Secondary | ICD-10-CM

## 2012-03-05 DIAGNOSIS — R634 Abnormal weight loss: Secondary | ICD-10-CM | POA: Diagnosis not present

## 2012-03-05 DIAGNOSIS — F329 Major depressive disorder, single episode, unspecified: Secondary | ICD-10-CM

## 2012-03-05 NOTE — Patient Instructions (Addendum)
[name]  [id number] April 07, 1935 Dr. Glenford Peers   [facility]  Discharge Instructions  RECOMMENDATIONS MADE BY THE CONSULTANT AND ANY TEST RESULTS WILL BE SENT TO YOUR REFERRING DOCTOR.   EXAM FINDINGS BY MD TODAY AND SIGNS AND SYMPTOMS TO REPORT TO CLINIC OR PRIMARY MD: Exam and discussion per MD.  Report any new lumps, bone pain or shortness of breath.  MEDICATIONS PRESCRIBED: none    SPECIAL INSTRUCTIONS/FOLLOW-UP: Return to Clinic in January for zometa and to see Dr. Mariel Sleet in 1 year.   I acknowledge that I have been informed and understand all the instructions given to me and received a copy. I do not have any more questions at this time, but understand that I may call the Specialty Clinic at [facility] at [phone] during business hours should I have any further questions or need assistance in obtaining follow-up care.    __________________________________________  _____________  __________ Signature of Patient or Authorized Representative            Date                   Time    __________________________________________ Nurse's Signature

## 2012-03-05 NOTE — Progress Notes (Signed)
Problem #1 stage II A. (T2, N0, M0) infiltrating lobular carcinoma left breast diagnosed in 2003 status post partial mastectomy on 02/25/2002 with a 3.0 3.5 cm primary ER +99% PR +80% HER-2/neu nonamplified and Ki-67 marker was low at 10% surgery with  negative sentinel nodes felt to be well-differentiated status post radiation therapy followed by Arimidex initially which she could not tolerate followed by letrozole and she completed 5 full years of adjuvant hormonal therapy.  Problem #2 depression over recent social events her life  Problem #3 left rotator cuff damage followed by her orthopedic surgeon with occasional steroid injection.  Problem #4-50 pound weight loss due to problem #2  Problem #5 hypothyroidism  Problem #6 history of hypertension  GERD in the past  [name] has had tremendous social issues lately revolving around her husband who is demented and is now living with one of his daughters.  She also fell in may of her back in with a local [employer]. She has been seen for that already in the emergency room. Other than that her oncologic review of systems is negative. Her vital signs are in the chart. Her physical exam shows no masses in either breast. Her lungs are clear. Her heart shows a regular rhythm and rate without murmur or gallop. There is no lymphadenopathy. Her abdomen is soft and nontender without organomegaly. She has no peripheral edema. She does have minimal tenderness over her paravertebral muscles in the thoracic area but no obvious spasticity or nodularity she has no leg edema or arm edema.  we will see her back in a year

## 2012-03-06 DIAGNOSIS — M1990 Unspecified osteoarthritis, unspecified site: Secondary | ICD-10-CM | POA: Diagnosis not present

## 2012-03-19 DIAGNOSIS — E039 Hypothyroidism, unspecified: Secondary | ICD-10-CM | POA: Diagnosis not present

## 2012-03-19 DIAGNOSIS — E538 Deficiency of other specified B group vitamins: Secondary | ICD-10-CM | POA: Diagnosis not present

## 2012-03-19 DIAGNOSIS — R51 Headache: Secondary | ICD-10-CM | POA: Diagnosis not present

## 2012-03-19 DIAGNOSIS — M549 Dorsalgia, unspecified: Secondary | ICD-10-CM | POA: Diagnosis not present

## 2012-03-29 DIAGNOSIS — E538 Deficiency of other specified B group vitamins: Secondary | ICD-10-CM | POA: Diagnosis not present

## 2012-04-23 DIAGNOSIS — E039 Hypothyroidism, unspecified: Secondary | ICD-10-CM | POA: Diagnosis not present

## 2012-04-23 DIAGNOSIS — L259 Unspecified contact dermatitis, unspecified cause: Secondary | ICD-10-CM | POA: Diagnosis not present

## 2012-04-23 DIAGNOSIS — K589 Irritable bowel syndrome without diarrhea: Secondary | ICD-10-CM | POA: Diagnosis not present

## 2012-04-29 DIAGNOSIS — E538 Deficiency of other specified B group vitamins: Secondary | ICD-10-CM | POA: Diagnosis not present

## 2012-05-28 DIAGNOSIS — Z23 Encounter for immunization: Secondary | ICD-10-CM | POA: Diagnosis not present

## 2012-05-30 DIAGNOSIS — E039 Hypothyroidism, unspecified: Secondary | ICD-10-CM | POA: Diagnosis not present

## 2012-05-30 DIAGNOSIS — R5383 Other fatigue: Secondary | ICD-10-CM | POA: Diagnosis not present

## 2012-05-30 DIAGNOSIS — M810 Age-related osteoporosis without current pathological fracture: Secondary | ICD-10-CM | POA: Diagnosis not present

## 2012-05-30 DIAGNOSIS — E538 Deficiency of other specified B group vitamins: Secondary | ICD-10-CM | POA: Diagnosis not present

## 2012-06-13 ENCOUNTER — Other Ambulatory Visit (HOSPITAL_COMMUNITY): Payer: Self-pay | Admitting: Internal Medicine

## 2012-06-13 DIAGNOSIS — M25519 Pain in unspecified shoulder: Secondary | ICD-10-CM | POA: Diagnosis not present

## 2012-06-13 DIAGNOSIS — Z139 Encounter for screening, unspecified: Secondary | ICD-10-CM

## 2012-06-28 DIAGNOSIS — E538 Deficiency of other specified B group vitamins: Secondary | ICD-10-CM | POA: Diagnosis not present

## 2012-07-08 DIAGNOSIS — E039 Hypothyroidism, unspecified: Secondary | ICD-10-CM | POA: Diagnosis not present

## 2012-07-08 DIAGNOSIS — E785 Hyperlipidemia, unspecified: Secondary | ICD-10-CM | POA: Diagnosis not present

## 2012-07-08 DIAGNOSIS — I10 Essential (primary) hypertension: Secondary | ICD-10-CM | POA: Diagnosis not present

## 2012-07-08 DIAGNOSIS — E782 Mixed hyperlipidemia: Secondary | ICD-10-CM | POA: Diagnosis not present

## 2012-07-15 ENCOUNTER — Ambulatory Visit (HOSPITAL_COMMUNITY)
Admission: RE | Admit: 2012-07-15 | Discharge: 2012-07-15 | Disposition: A | Discharge status: Home / Self Care | Payer: Medicare Other | Source: Ambulatory Visit | Attending: Internal Medicine | Admitting: Internal Medicine

## 2012-07-15 DIAGNOSIS — Z1231 Encounter for screening mammogram for malignant neoplasm of breast: Secondary | ICD-10-CM | POA: Insufficient documentation

## 2012-07-15 DIAGNOSIS — Z139 Encounter for screening, unspecified: Secondary | ICD-10-CM

## 2012-07-25 DIAGNOSIS — E538 Deficiency of other specified B group vitamins: Secondary | ICD-10-CM | POA: Diagnosis not present

## 2012-08-22 DIAGNOSIS — J019 Acute sinusitis, unspecified: Secondary | ICD-10-CM | POA: Diagnosis not present

## 2012-08-22 DIAGNOSIS — E538 Deficiency of other specified B group vitamins: Secondary | ICD-10-CM | POA: Diagnosis not present

## 2012-08-22 DIAGNOSIS — J069 Acute upper respiratory infection, unspecified: Secondary | ICD-10-CM | POA: Diagnosis not present

## 2012-09-06 ENCOUNTER — Ambulatory Visit (HOSPITAL_COMMUNITY): Payer: Medicare Other

## 2012-09-06 ENCOUNTER — Other Ambulatory Visit (HOSPITAL_COMMUNITY): Payer: Medicare Other

## 2012-09-09 DIAGNOSIS — J069 Acute upper respiratory infection, unspecified: Secondary | ICD-10-CM | POA: Diagnosis not present

## 2012-09-13 ENCOUNTER — Ambulatory Visit (HOSPITAL_COMMUNITY): Payer: Medicare Other

## 2012-09-13 ENCOUNTER — Other Ambulatory Visit (HOSPITAL_COMMUNITY): Payer: Medicare Other

## 2012-09-27 ENCOUNTER — Other Ambulatory Visit (HOSPITAL_COMMUNITY): Payer: Self-pay | Admitting: Oncology

## 2012-09-27 ENCOUNTER — Encounter (HOSPITAL_COMMUNITY): Payer: Medicare Other | Attending: Oncology

## 2012-09-27 ENCOUNTER — Other Ambulatory Visit (HOSPITAL_COMMUNITY): Payer: Self-pay

## 2012-09-27 DIAGNOSIS — M810 Age-related osteoporosis without current pathological fracture: Secondary | ICD-10-CM | POA: Diagnosis not present

## 2012-09-27 DIAGNOSIS — E876 Hypokalemia: Secondary | ICD-10-CM

## 2012-09-27 DIAGNOSIS — C50919 Malignant neoplasm of unspecified site of unspecified female breast: Secondary | ICD-10-CM

## 2012-09-27 LAB — DIFFERENTIAL
Basophils Absolute: 0 10*3/uL (ref 0.0–0.1)
Basophils Relative: 0 % (ref 0–1)
Eosinophils Relative: 3 % (ref 0–5)
Monocytes Absolute: 0.7 10*3/uL (ref 0.1–1.0)
Neutro Abs: 3.8 10*3/uL (ref 1.7–7.7)

## 2012-09-27 LAB — COMPREHENSIVE METABOLIC PANEL
AST: 16 U/L (ref 0–37)
BUN: 15 mg/dL (ref 6–23)
CO2: 30 mEq/L (ref 19–32)
Chloride: 105 mEq/L (ref 96–112)
Creatinine, Ser: 0.82 mg/dL (ref 0.50–1.10)
GFR calc Af Amer: 79 mL/min — ABNORMAL LOW (ref >90)
GFR calc non Af Amer: 68 mL/min — ABNORMAL LOW (ref >90)
Glucose, Bld: 89 mg/dL (ref 70–99)
Total Bilirubin: 0.3 mg/dL (ref 0.3–1.2)

## 2012-09-27 LAB — CBC
HCT: 33.3 % — ABNORMAL LOW (ref 36.0–46.0)
Hemoglobin: 10.8 g/dL — ABNORMAL LOW (ref 12.0–15.0)
MCV: 88.3 fL (ref 78.0–100.0)
Platelets: 230 10*3/uL (ref 150–400)
RBC: 3.77 MIL/uL — ABNORMAL LOW (ref 3.87–5.11)
WBC: 6.7 10*3/uL (ref 4.0–10.5)

## 2012-09-27 MED ORDER — SODIUM CHLORIDE 0.9 % IV SOLN
INTRAVENOUS | Status: DC
  Administered 2012-09-27: 11:00:00 via INTRAVENOUS

## 2012-09-27 MED ORDER — ZOLEDRONIC ACID 4 MG/5ML IV CONC
4.0000 mg | Freq: Once | INTRAVENOUS | Status: AC
  Administered 2012-09-27: 4 mg via INTRAVENOUS
  Filled 2012-09-27: qty 5

## 2012-09-27 MED ORDER — POTASSIUM CHLORIDE CRYS ER 20 MEQ PO TBCR: 20.0000 meq | EXTENDED_RELEASE_TABLET | Freq: Two times a day (BID) | ORAL | Status: DC

## 2012-09-27 MED ORDER — ZOLEDRONIC ACID 4 MG/5ML IV CONC
4.0000 mg | Freq: Once | INTRAVENOUS | Status: DC
  Filled 2012-09-27: qty 5

## 2012-10-22 DIAGNOSIS — R252 Cramp and spasm: Secondary | ICD-10-CM | POA: Diagnosis not present

## 2012-10-22 DIAGNOSIS — M25519 Pain in unspecified shoulder: Secondary | ICD-10-CM | POA: Diagnosis not present

## 2012-10-22 DIAGNOSIS — Z79899 Other long term (current) drug therapy: Secondary | ICD-10-CM | POA: Diagnosis not present

## 2012-10-22 DIAGNOSIS — K5900 Constipation, unspecified: Secondary | ICD-10-CM | POA: Diagnosis not present

## 2012-10-24 DIAGNOSIS — M25519 Pain in unspecified shoulder: Secondary | ICD-10-CM | POA: Diagnosis not present

## 2012-10-28 DIAGNOSIS — Z79899 Other long term (current) drug therapy: Secondary | ICD-10-CM | POA: Diagnosis not present

## 2012-10-30 DIAGNOSIS — S43429A Sprain of unspecified rotator cuff capsule, initial encounter: Secondary | ICD-10-CM | POA: Diagnosis not present

## 2012-10-30 DIAGNOSIS — S43439A Superior glenoid labrum lesion of unspecified shoulder, initial encounter: Secondary | ICD-10-CM | POA: Diagnosis not present

## 2012-10-30 DIAGNOSIS — M19019 Primary osteoarthritis, unspecified shoulder: Secondary | ICD-10-CM | POA: Diagnosis not present

## 2012-12-31 ENCOUNTER — Emergency Department (HOSPITAL_COMMUNITY)
Admission: EM | Admit: 2012-12-31 | Discharge: 2012-12-31 | Disposition: A | Discharge status: Home / Self Care | Payer: Medicare Other | Attending: Emergency Medicine | Admitting: Emergency Medicine

## 2012-12-31 ENCOUNTER — Emergency Department (HOSPITAL_COMMUNITY): Payer: Medicare Other

## 2012-12-31 ENCOUNTER — Encounter (HOSPITAL_COMMUNITY): Payer: Self-pay | Admitting: Emergency Medicine

## 2012-12-31 DIAGNOSIS — Z862 Personal history of diseases of the blood and blood-forming organs and certain disorders involving the immune mechanism: Secondary | ICD-10-CM | POA: Diagnosis not present

## 2012-12-31 DIAGNOSIS — R072 Precordial pain: Secondary | ICD-10-CM | POA: Diagnosis not present

## 2012-12-31 DIAGNOSIS — Z853 Personal history of malignant neoplasm of breast: Secondary | ICD-10-CM | POA: Insufficient documentation

## 2012-12-31 DIAGNOSIS — I10 Essential (primary) hypertension: Secondary | ICD-10-CM | POA: Insufficient documentation

## 2012-12-31 DIAGNOSIS — E039 Hypothyroidism, unspecified: Secondary | ICD-10-CM | POA: Diagnosis not present

## 2012-12-31 DIAGNOSIS — Z8639 Personal history of other endocrine, nutritional and metabolic disease: Secondary | ICD-10-CM | POA: Insufficient documentation

## 2012-12-31 DIAGNOSIS — R079 Chest pain, unspecified: Secondary | ICD-10-CM | POA: Diagnosis not present

## 2012-12-31 DIAGNOSIS — I2510 Atherosclerotic heart disease of native coronary artery without angina pectoris: Secondary | ICD-10-CM | POA: Diagnosis not present

## 2012-12-31 DIAGNOSIS — Z8739 Personal history of other diseases of the musculoskeletal system and connective tissue: Secondary | ICD-10-CM | POA: Diagnosis not present

## 2012-12-31 DIAGNOSIS — Z79899 Other long term (current) drug therapy: Secondary | ICD-10-CM | POA: Diagnosis not present

## 2012-12-31 DIAGNOSIS — Z87442 Personal history of urinary calculi: Secondary | ICD-10-CM | POA: Diagnosis not present

## 2012-12-31 DIAGNOSIS — Z8719 Personal history of other diseases of the digestive system: Secondary | ICD-10-CM | POA: Insufficient documentation

## 2012-12-31 DIAGNOSIS — R0602 Shortness of breath: Secondary | ICD-10-CM | POA: Diagnosis not present

## 2012-12-31 LAB — BASIC METABOLIC PANEL
CO2: 26 mEq/L (ref 19–32)
Calcium: 10.6 mg/dL — ABNORMAL HIGH (ref 8.4–10.5)
Creatinine, Ser: 0.8 mg/dL (ref 0.50–1.10)
GFR calc Af Amer: 82 mL/min — ABNORMAL LOW (ref >90)
GFR calc non Af Amer: 70 mL/min — ABNORMAL LOW (ref >90)

## 2012-12-31 LAB — CBC WITH DIFFERENTIAL/PLATELET
Basophils Absolute: 0 10*3/uL (ref 0.0–0.1)
Basophils Relative: 0 % (ref 0–1)
Eosinophils Absolute: 0.1 10*3/uL (ref 0.0–0.7)
Eosinophils Relative: 2 % (ref 0–5)
HCT: 36.7 % (ref 36.0–46.0)
MCH: 28.9 pg (ref 26.0–34.0)
MCHC: 33.5 g/dL (ref 30.0–36.0)
MCV: 86.2 fL (ref 78.0–100.0)
Monocytes Absolute: 0.5 10*3/uL (ref 0.1–1.0)
Platelets: 242 10*3/uL (ref 150–400)
RDW: 14.1 % (ref 11.5–15.5)

## 2012-12-31 LAB — TROPONIN I: Troponin I: <0.3 ng/mL (ref <0.30)

## 2012-12-31 MED ORDER — HYDROMORPHONE HCL PF 1 MG/ML IJ SOLN
0.5000 mg | Freq: Once | INTRAMUSCULAR | Status: AC
  Administered 2012-12-31: 0.5 mg via INTRAVENOUS
  Filled 2012-12-31: qty 1

## 2012-12-31 MED ORDER — MORPHINE SULFATE 4 MG/ML IJ SOLN
6.0000 mg | Freq: Once | INTRAMUSCULAR | Status: AC
  Administered 2012-12-31: 6 mg via INTRAVENOUS
  Filled 2012-12-31: qty 2

## 2012-12-31 MED ORDER — HYDROCODONE-ACETAMINOPHEN 5-325 MG PO TABS: 1.0000 | ORAL_TABLET | Freq: Four times a day (QID) | ORAL | Status: DC | PRN

## 2012-12-31 MED ORDER — LORAZEPAM 1 MG PO TABS
0.5000 mg | ORAL_TABLET | Freq: Once | ORAL | Status: AC
  Administered 2012-12-31: 0.5 mg via ORAL
  Filled 2012-12-31: qty 1

## 2012-12-31 NOTE — ED Notes (Signed)
Pt does have a ride home due to admin of narcotics.

## 2012-12-31 NOTE — ED Notes (Signed)
Central chest pain with radiation to back since 630am today

## 2012-12-31 NOTE — ED Provider Notes (Signed)
History  This chart was scribed for [name], MD by Shari Heritage, ED Scribe. The patient was seen in room [facility]14[facility]. Patient's care was started at 0858.   CSN: [id number]  Arrival date & time 12/31/12  4098   First MD Initiated Contact with Patient 12/31/12 [phone]      Chief Complaint  Patient presents with  . Chest Pain  . Shortness of Breath     Patient is a 78 y.o. female presenting with chest pain. The history is provided by the patient. No language interpreter was used.  Chest Pain Pain location:  Substernal area Pain quality: dull   Pain radiates to:  Mid back Pain radiates to the back: yes   Pain severity:  Moderate Onset quality:  Gradual Duration: 2.5 hours. Chronicity:  New Associated symptoms: no abdominal pain, no back pain, no cough, no fatigue, no fever and no headache      HPI Comments: [name] is a 78 y.o. female with history of breast cancer, coronary artery disease, HTN, osteoarthritis, osteoporosis, hiatal hernia, hypothyroidism who presents to the Emergency Department complaining of moderate to severe, sharp, intermittent central chest pain that radiates through to her back onset 2.5 hours ago. Patient states that sharp pains come every five minutes. Pain is unchanged with movement. She denies history of similar pain. She denies any other symptoms at this time. Patient reports that she has a surgery scheduled for May 12 to treat chronic shoulder problems. She also reports increased right shoulder pain today. Patient has never smoked. Patient has had hernia repair surgery and is currently taking Nexium.   Past Medical History  Diagnosis Date  . History of chest pain     MI ruled out  . Non-occlusive coronary artery disease 2003    by Catheterization  . History of hiatal hernia   . History of esophageal spasm   . Irritable bowel syndrome   . Breast cancer 2003  . Syncope, non cardiac   . Osteoarthritis   . Diverticulitis     History  .  HTN (hypertension)   . Hypokalemia   . Kidney stones   . Osteoporosis 09/08/2011  . Hypothyroidism   . Dyslexia     Past Surgical History  Procedure Laterality Date  . Thyroidectomy      Hypothyroidism status post  . Cholecystectomy  1997    Status post -stones  . Laparoscopic nissen fundoplication  1997  . Arm fx      left  . Breast lumpectomy      Left  . Knee surgery    . Kidney stone surgery  1992  . Mandible reconstruction    . Cataract extraction    . Neck surgery  2000    [facility]  . Cataract extraction w/phaco  10/02/2011    Procedure: CATARACT EXTRACTION PHACO AND INTRAOCULAR LENS PLACEMENT (IOC);  Surgeon: Gemma Payor, MD;  Location: [facility];  Service: Ophthalmology;  Laterality: Right;  CDE:17.01  . Kidney stent  06/04/11    removed after about 3 weeks.  . Lithotripsy  11/12    Family History  Problem Relation Age of Onset  . Anesthesia problems Neg Hx   . Hypotension Neg Hx   . Pseudochol deficiency Neg Hx   . Malignant hyperthermia Neg Hx     History  Substance Use Topics  . Smoking status: Never Smoker   . Smokeless tobacco: Never Used  . Alcohol Use: No     Comment: occasionally  OB History   Grav Para Term Preterm Abortions TAB SAB Ect Mult Living                  Review of Systems  Constitutional: Negative for fever, chills, appetite change and fatigue.  HENT: Negative for congestion, sinus pressure and ear discharge.   Eyes: Negative for discharge.  Respiratory: Negative for cough.   Cardiovascular: Positive for chest pain.  Gastrointestinal: Negative for abdominal pain and diarrhea.  Genitourinary: Negative for frequency and hematuria.  Musculoskeletal: Negative for back pain.  Skin: Negative for rash.  Neurological: Negative for seizures and headaches.  Psychiatric/Behavioral: Negative for hallucinations.    Allergies  Metronidazole; Oxycodone; and Ciprofloxacin  Home Medications   Current Outpatient Rx  Name  Route  Sig  Dispense   Refill  . amLODipine-benazepril (LOTREL) 5-20 MG per capsule   Oral   Take 1 capsule by mouth daily.           . diphenhydrAMINE (ALLERGY RELIEF) 25 mg capsule   Oral   Take 25 mg by mouth as needed.         Marland Kitchen esomeprazole (NEXIUM) 40 MG capsule   Oral   Take 40 mg by mouth daily.          Marland Kitchen levothyroxine (SYNTHROID, LEVOTHROID) 100 MCG tablet   Oral   Take 100 mcg by mouth daily.         . Multiple Vitamin (MULTIVITAMIN WITH MINERALS) TABS   Oral   Take 1 tablet by mouth every evening.         . Naproxen Sodium (ALEVE PO)   Oral   Take by mouth.         . potassium chloride SA (K-DUR,KLOR-CON) 20 MEQ tablet   Oral   Take 1 tablet (20 mEq total) by mouth 2 (two) times daily. Take one table by mouth 2x per day for 7-10 days then one a day.  Call her when RX ready for pick-up please   45 tablet   6   . Probiotic Product (ALIGN PO)   Oral   Take by mouth daily.            Triage Vitals: BP 175/86  Temp(Src) 98 F (36.7 C) (Oral)  Resp 18  Ht 5\' 6"  (1.676 m)  Wt 150 lb (68.04 kg)  BMI 24.22 kg/m2  SpO2 99%  Physical Exam  Constitutional: She is oriented to person, place, and time. She appears well-developed.  HENT:  Head: Normocephalic.  Eyes: Conjunctivae and EOM are normal. No scleral icterus.  Neck: Neck supple. No thyromegaly present.  Cardiovascular: Normal rate and regular rhythm.  Exam reveals no gallop and no friction rub.   No murmur heard. Pulmonary/Chest: No stridor. She has no wheezes. She has no rales. She exhibits no tenderness.  Abdominal: She exhibits no distension. There is no tenderness. There is no rebound.  Musculoskeletal: Normal range of motion. She exhibits no edema.  Lymphadenopathy:    She has no cervical adenopathy.  Neurological: She is oriented to person, place, and time. Coordination normal.  Skin: No rash noted. No erythema.  Psychiatric: She has a normal mood and affect. Her behavior is normal.    ED Course   Procedures (including critical care time) DIAGNOSTIC STUDIES: Oxygen Saturation is 99% on room air, normal by my interpretation.    COORDINATION OF CARE: 9:19 AM- Patient informed of current plan for treatment and evaluation and agrees with plan at this time.  11:39 AM- She states that pain is persistent. She rates current pain severity as 5/10. Labs are negative for acute cardiac disease. CXR is also negative.   12:59 PM-  States that pain is significantly improved. Additional testing is negative for acute cardiac disease. Patient asked to know the results of other labs so I reviewed these with patient and provided reassurance. Patient has been reasonably evaluated and I feel that she is stable for discharge at this time.    Labs Reviewed  BASIC METABOLIC PANEL - Abnormal; Notable for the following:    Calcium 10.6 (*)    GFR calc non Af Amer 70 (*)    GFR calc Af Amer 82 (*)    All other components within normal limits  CBC WITH DIFFERENTIAL  TROPONIN I  TROPONIN I    Dg Chest Portable 1 View  12/31/2012  *RADIOLOGY REPORT*  Clinical Data: Mid chest pain, shortness of breath  PORTABLE CHEST - 1 VIEW  Comparison: 02/21/2012  Findings: Stable cardiomegaly without CHF or pneumonia.  Postop clips project over the left chest and axilla.  Hiatal hernia again evident.  No focal pneumonia, collapse, consolidation, effusion, or pneumothorax.  No osseous abnormality.  IMPRESSION: No acute chest process.  Postoperative findings.  Hiatal hernia.   Original Report Authenticated By: Judie Petit. Miles Costain, M.D.      No diagnosis found.   Date: 12/31/2012  Rate: 124  Rhythm: sinus tachycardia  QRS Axis: normal  Intervals: normal  ST/T Wave abnormalities: normal  Conduction Disutrbances:none  Narrative Interpretation:   Old EKG Reviewed: unchanged    MDM   The chart was scribed for me under my direct supervision.  I personally performed the history, physical, and medical decision making and all  procedures in the evaluation of this patient.[name], MD 12/31/12 1308

## 2013-01-13 DIAGNOSIS — M75110 Incomplete rotator cuff tear or rupture of unspecified shoulder, not specified as traumatic: Secondary | ICD-10-CM | POA: Diagnosis not present

## 2013-01-13 DIAGNOSIS — M24119 Other articular cartilage disorders, unspecified shoulder: Secondary | ICD-10-CM | POA: Diagnosis not present

## 2013-01-13 DIAGNOSIS — G8918 Other acute postprocedural pain: Secondary | ICD-10-CM | POA: Diagnosis not present

## 2013-01-13 DIAGNOSIS — M19019 Primary osteoarthritis, unspecified shoulder: Secondary | ICD-10-CM | POA: Diagnosis not present

## 2013-01-13 DIAGNOSIS — M9420 Chondromalacia, unspecified site: Secondary | ICD-10-CM | POA: Diagnosis not present

## 2013-01-13 DIAGNOSIS — M75100 Unspecified rotator cuff tear or rupture of unspecified shoulder, not specified as traumatic: Secondary | ICD-10-CM | POA: Diagnosis not present

## 2013-01-13 DIAGNOSIS — M25519 Pain in unspecified shoulder: Secondary | ICD-10-CM | POA: Diagnosis not present

## 2013-01-16 DIAGNOSIS — M25519 Pain in unspecified shoulder: Secondary | ICD-10-CM | POA: Diagnosis not present

## 2013-01-20 DIAGNOSIS — M25519 Pain in unspecified shoulder: Secondary | ICD-10-CM | POA: Diagnosis not present

## 2013-01-23 DIAGNOSIS — M25519 Pain in unspecified shoulder: Secondary | ICD-10-CM | POA: Diagnosis not present

## 2013-01-28 DIAGNOSIS — M25519 Pain in unspecified shoulder: Secondary | ICD-10-CM | POA: Diagnosis not present

## 2013-01-30 DIAGNOSIS — M25519 Pain in unspecified shoulder: Secondary | ICD-10-CM | POA: Diagnosis not present

## 2013-02-03 DIAGNOSIS — M25519 Pain in unspecified shoulder: Secondary | ICD-10-CM | POA: Diagnosis not present

## 2013-02-05 DIAGNOSIS — M25519 Pain in unspecified shoulder: Secondary | ICD-10-CM | POA: Diagnosis not present

## 2013-02-10 DIAGNOSIS — M25519 Pain in unspecified shoulder: Secondary | ICD-10-CM | POA: Diagnosis not present

## 2013-02-13 DIAGNOSIS — M25519 Pain in unspecified shoulder: Secondary | ICD-10-CM | POA: Diagnosis not present

## 2013-02-17 DIAGNOSIS — M25519 Pain in unspecified shoulder: Secondary | ICD-10-CM | POA: Diagnosis not present

## 2013-02-19 DIAGNOSIS — M25519 Pain in unspecified shoulder: Secondary | ICD-10-CM | POA: Diagnosis not present

## 2013-02-24 DIAGNOSIS — Z4789 Encounter for other orthopedic aftercare: Secondary | ICD-10-CM | POA: Diagnosis not present

## 2013-02-25 DIAGNOSIS — K589 Irritable bowel syndrome without diarrhea: Secondary | ICD-10-CM | POA: Diagnosis not present

## 2013-02-25 DIAGNOSIS — K5900 Constipation, unspecified: Secondary | ICD-10-CM | POA: Diagnosis not present

## 2013-02-25 DIAGNOSIS — R109 Unspecified abdominal pain: Secondary | ICD-10-CM | POA: Diagnosis not present

## 2013-02-25 DIAGNOSIS — M25519 Pain in unspecified shoulder: Secondary | ICD-10-CM | POA: Diagnosis not present

## 2013-02-27 DIAGNOSIS — M25519 Pain in unspecified shoulder: Secondary | ICD-10-CM | POA: Diagnosis not present

## 2013-03-04 ENCOUNTER — Ambulatory Visit (HOSPITAL_COMMUNITY): Payer: Medicare Other | Admitting: Oncology

## 2013-03-05 DIAGNOSIS — M25519 Pain in unspecified shoulder: Secondary | ICD-10-CM | POA: Diagnosis not present

## 2013-03-11 DIAGNOSIS — M25519 Pain in unspecified shoulder: Secondary | ICD-10-CM | POA: Diagnosis not present

## 2013-03-12 ENCOUNTER — Other Ambulatory Visit (HOSPITAL_COMMUNITY): Payer: Self-pay | Admitting: Internal Medicine

## 2013-03-12 DIAGNOSIS — K5900 Constipation, unspecified: Secondary | ICD-10-CM | POA: Diagnosis not present

## 2013-03-12 DIAGNOSIS — K5792 Diverticulitis of intestine, part unspecified, without perforation or abscess without bleeding: Secondary | ICD-10-CM

## 2013-03-12 DIAGNOSIS — E782 Mixed hyperlipidemia: Secondary | ICD-10-CM | POA: Diagnosis not present

## 2013-03-12 DIAGNOSIS — E785 Hyperlipidemia, unspecified: Secondary | ICD-10-CM | POA: Diagnosis not present

## 2013-03-12 DIAGNOSIS — R109 Unspecified abdominal pain: Secondary | ICD-10-CM | POA: Diagnosis not present

## 2013-03-12 DIAGNOSIS — R10819 Abdominal tenderness, unspecified site: Secondary | ICD-10-CM | POA: Diagnosis not present

## 2013-03-12 DIAGNOSIS — N200 Calculus of kidney: Secondary | ICD-10-CM

## 2013-03-12 DIAGNOSIS — R630 Anorexia: Secondary | ICD-10-CM | POA: Diagnosis not present

## 2013-03-13 ENCOUNTER — Ambulatory Visit (HOSPITAL_COMMUNITY)
Admission: RE | Admit: 2013-03-13 | Discharge: 2013-03-13 | Disposition: A | Discharge status: Home / Self Care | Payer: Medicare Other | Source: Ambulatory Visit | Attending: Internal Medicine | Admitting: Internal Medicine

## 2013-03-13 DIAGNOSIS — M25519 Pain in unspecified shoulder: Secondary | ICD-10-CM | POA: Diagnosis not present

## 2013-03-13 DIAGNOSIS — K5792 Diverticulitis of intestine, part unspecified, without perforation or abscess without bleeding: Secondary | ICD-10-CM

## 2013-03-13 DIAGNOSIS — R1032 Left lower quadrant pain: Secondary | ICD-10-CM | POA: Diagnosis not present

## 2013-03-13 DIAGNOSIS — R933 Abnormal findings on diagnostic imaging of other parts of digestive tract: Secondary | ICD-10-CM | POA: Diagnosis not present

## 2013-03-13 DIAGNOSIS — R319 Hematuria, unspecified: Secondary | ICD-10-CM | POA: Insufficient documentation

## 2013-03-13 DIAGNOSIS — N200 Calculus of kidney: Secondary | ICD-10-CM

## 2013-03-13 DIAGNOSIS — K5289 Other specified noninfective gastroenteritis and colitis: Secondary | ICD-10-CM | POA: Diagnosis not present

## 2013-03-18 ENCOUNTER — Encounter (HOSPITAL_COMMUNITY): Payer: Self-pay

## 2013-03-18 ENCOUNTER — Encounter (HOSPITAL_COMMUNITY): Payer: Medicare Other | Attending: Hematology and Oncology

## 2013-03-18 ENCOUNTER — Ambulatory Visit (HOSPITAL_COMMUNITY): Payer: Medicare Other

## 2013-03-18 VITALS — BP 116/78 | HR 87 | Temp 98.9°F | Resp 18 | Wt 142.4 lb

## 2013-03-18 DIAGNOSIS — R634 Abnormal weight loss: Secondary | ICD-10-CM | POA: Diagnosis not present

## 2013-03-18 DIAGNOSIS — C50919 Malignant neoplasm of unspecified site of unspecified female breast: Secondary | ICD-10-CM

## 2013-03-18 DIAGNOSIS — Z09 Encounter for follow-up examination after completed treatment for conditions other than malignant neoplasm: Secondary | ICD-10-CM | POA: Diagnosis not present

## 2013-03-18 DIAGNOSIS — Z853 Personal history of malignant neoplasm of breast: Secondary | ICD-10-CM | POA: Diagnosis not present

## 2013-03-18 DIAGNOSIS — M25519 Pain in unspecified shoulder: Secondary | ICD-10-CM | POA: Diagnosis not present

## 2013-03-18 DIAGNOSIS — M810 Age-related osteoporosis without current pathological fracture: Secondary | ICD-10-CM | POA: Diagnosis not present

## 2013-03-18 DIAGNOSIS — C50912 Malignant neoplasm of unspecified site of left female breast: Secondary | ICD-10-CM

## 2013-03-18 LAB — CBC WITH DIFFERENTIAL/PLATELET
Lymphocytes Relative: 33 % (ref 12–46)
Lymphs Abs: 2.3 10*3/uL (ref 0.7–4.0)
MCV: 87.1 fL (ref 78.0–100.0)
Neutrophils Relative %: 57 % (ref 43–77)
Platelets: 343 10*3/uL (ref 150–400)
RBC: 3.88 MIL/uL (ref 3.87–5.11)
WBC: 7 10*3/uL (ref 4.0–10.5)

## 2013-03-18 LAB — COMPREHENSIVE METABOLIC PANEL
Albumin: 3.4 g/dL — ABNORMAL LOW (ref 3.5–5.2)
BUN: 14 mg/dL (ref 6–23)
Calcium: 10.7 mg/dL — ABNORMAL HIGH (ref 8.4–10.5)
Chloride: 101 mEq/L (ref 96–112)
Creatinine, Ser: 0.97 mg/dL (ref 0.50–1.10)
GFR calc non Af Amer: 56 mL/min — ABNORMAL LOW (ref >90)
Total Bilirubin: 0.1 mg/dL — ABNORMAL LOW (ref 0.3–1.2)

## 2013-03-18 LAB — LACTATE DEHYDROGENASE: LDH: 135 U/L (ref 94–250)

## 2013-03-18 NOTE — Patient Instructions (Addendum)
[facility] Discharge Instructions  RECOMMENDATIONS MADE BY THE CONSULTANT AND ANY TEST RESULTS WILL BE SENT TO YOUR REFERRING PHYSICIAN.  EXAM FINDINGS BY THE PHYSICIAN TODAY AND SIGNS OR SYMPTOMS TO REPORT TO CLINIC OR PRIMARY PHYSICIAN: Exam and discussion by Dr. Sharia Reeve.  Because of your night sweats and weight loss we need to do some additional blood work and CT scan of your chest.  We also need to re-check your bone density to make sure your bones are ok.  MEDICATIONS PRESCRIBED:  none  INSTRUCTIONS GIVEN AND DISCUSSED: Report any new lumps, bone pain, shortness of breath or other symptoms.  SPECIAL INSTRUCTIONS/FOLLOW-UP: CTs and Bone Density as scheduled and to be seen in follow-up in 2 weeks.  Thank you for choosing [facility] to provide your oncology and hematology care.  To afford each patient quality time with our providers, please arrive at least 15 minutes before your scheduled appointment time.  With your help, our goal is to use those 15 minutes to complete the necessary work-up to ensure our physicians have the information they need to help with your evaluation and healthcare recommendations.    Effective January 1st, 2014, we ask that you re-schedule your appointment with our physicians should you arrive 10 or more minutes late for your appointment.  We strive to give you quality time with our providers, and arriving late affects you and other patients whose appointments are after yours.    Again, thank you for choosing [facility].  Our hope is that these requests will decrease the amount of time that you wait before being seen by our physicians.       _____________________________________________________________  Should you have questions after your visit to [facility], please contact our office at [phone] between the hours of 8:30 a.m. and 5:00 p.m.  Voicemails left after 4:30 p.m. will not be returned  until the following business day.  For prescription refill requests, have your pharmacy contact our office with your prescription refill request.

## 2013-03-18 NOTE — Progress Notes (Signed)
[name] presented for labwork. Labs per MD order drawn via Peripheral Line 23 gauge needle inserted in right AC  Good blood return present. Procedure without incident.  Needle removed intact. Patient tolerated procedure well.

## 2013-03-18 NOTE — Progress Notes (Addendum)
[facility] [phone]   [phone]  OFFICE PROGRESS NOTE  Catalina Pizza, MD [place] [address]  DIAGNOSIS: Stage II A. (T2, N0, M0) infiltrating lobular carcinoma left breast    ONCOLOGIC HISTORY:Per Dr Wilford Corner II A. (T2, N0, M0) infiltrating lobular carcinoma left breast diagnosed in 2003 status post partial mastectomy on 02/25/2002 with a 3.0 3.5 cm primary ER +99% PR +80% HER-2/neu nonamplified and Ki-67 marker was low at 10% surgery with negative sentinel nodes felt to be well-differentiated status post radiation therapy followed by Arimidex initially which she could not tolerate followed by letrozole and she completed 5 full years of adjuvant hormonal therapy   INTERVAL HISTORY:   [name] 78 y.o. female returns to the clinic today for scheduled follow up.  She states that she had left shoulder surgery in May 2014 and still has  Left shoulder pain otherwise healing well. In the past one year patient has had unexplained 12 pounds weight loss which he attributes to antibiotic allergy.  She reports drenching night sweats.  She denies fever or any lymphadenopathy and reports that she was treated for kidney stone and diverticulitis  Recently. She denies any new breast lumps.  She denies bone pain.  She had a recent CT of the abdomen on 03/13/2013 which showed diverticulitis otherwise no evidence of malignancy.   MEDICAL HISTORY: Past Medical History  Diagnosis Date  . History of chest pain     MI ruled out  . Non-occlusive coronary artery disease 2003    by Catheterization  . History of hiatal hernia   . History of esophageal spasm   . Irritable bowel syndrome   . Breast cancer 2003  . Syncope, non cardiac   . Osteoarthritis   . Diverticulitis     History  . HTN (hypertension)   . Hypokalemia   . Kidney stones   . Osteoporosis 09/08/2011  . Hypothyroidism   . Dyslexia   . Kidney stone     ALLERGIES:  is allergic to  metronidazole; oxycodone; and ciprofloxacin.  MEDICATIONS:  Current Outpatient Prescriptions  Medication Sig Dispense Refill  . esomeprazole (NEXIUM) 40 MG capsule Take 40 mg by mouth daily.       Marland Kitchen HYDROcodone-acetaminophen (NORCO/VICODIN) 5-325 MG per tablet Take 1 tablet by mouth every 6 (six) hours as needed for pain.  20 tablet  0  . levothyroxine (SYNTHROID, LEVOTHROID) 88 MCG tablet Take 88 mcg by mouth daily before breakfast.      . Multiple Vitamins-Minerals (CENTRUM SILVER ADULT 50+ PO) Take 1 capsule by mouth daily.      . pravastatin (PRAVACHOL) 20 MG tablet Take 20 mg by mouth daily.      . Sulfamethoxazole-Trimethoprim (SULFAMETHOXAZOLE-TMP DS PO) Take 1 capsule by mouth 2 (two) times daily.       No current facility-administered medications for this visit.    SURGICAL HISTORY:  Past Surgical History  Procedure Laterality Date  . Thyroidectomy      Hypothyroidism status post  . Cholecystectomy  1997    Status post -stones  . Laparoscopic nissen fundoplication  1997  . Arm fx      left  . Breast lumpectomy      Left  . Knee surgery    . Kidney stone surgery  1992  . Mandible reconstruction    . Cataract extraction    . Neck surgery  2000    [facility]  . Cataract extraction w/phaco  10/02/2011    Procedure: CATARACT EXTRACTION PHACO AND INTRAOCULAR LENS PLACEMENT (IOC);  Surgeon: Gemma Payor, MD;  Location: [facility];  Service: Ophthalmology;  Laterality: Right;  CDE:17.01  . Kidney stent  06/04/11    removed after about 3 weeks.  . Lithotripsy  11/12     REVIEW OF SYSTEMS: 14 point review of system is as in the history above otherwise negative.   PHYSICAL EXAMINATION:  Blood pressure 116/78, pulse 87, temperature 98.9 F (37.2 C), temperature source Oral, resp. rate 18, weight 142 lb 6.4 oz (64.592 kg). GENERAL: No distress. SKIN:  No rashes or significant lesions  HEAD: Normocephalic, No masses, lesions, tenderness or abnormalities  EYES: Conjunctiva are pink and  non-injected  ENT: External ears normal ,lips, buccal mucosa, and tongue normal and mucous membranes are moist  LYMPH: No palpable lymphadenopathy,  BREAST:normal right breast without any masses.  Left upper quadrant mastectomy scar well healed no evidence of local recurrence of significant mass in the left breast. LUNGS: clear to auscultation , no crackles or wheezes HEART: regular rate & rhythm, no murmurs, no gallops, S1 normal and S2 normal  ABDOMEN: Abdomen scaphoid,soft, non-tender, normal bowel sounds, no masses or organomegaly and no hepatosplenomegaly palpable MSK: No CVA tenderness and no tenderness on percussion of the back or rib cage. EXTREMITIES: No edema, no skin discoloration or tenderness NEURO: alert & oriented , no focal motor/sensory deficits.     LABORATORY DATA: Lab Results  Component Value Date   WBC 7.2 12/31/2012   HGB 12.3 12/31/2012   HCT 36.7 12/31/2012   MCV 86.2 12/31/2012   PLT 242 12/31/2012      Chemistry      Component Value Date/Time   NA 140 12/31/2012 0903   K 3.7 12/31/2012 0903   CL 104 12/31/2012 0903   CO2 26 12/31/2012 0903   BUN 15 12/31/2012 0903   CREATININE 0.80 12/31/2012 0903      Component Value Date/Time   CALCIUM 10.6* 12/31/2012 0903   ALKPHOS 51 09/27/2012 1058   AST 16 09/27/2012 1058   ALT 9 09/27/2012 1058   BILITOT 0.3 09/27/2012 1058     Lab result from PCPs office dated  03/12/2013 has been reviewed and noted.   RADIOGRAPHIC STUDIES: Ct Abdomen Pelvis Wo Contrast  03/13/2013   *RADIOLOGY REPORT*  Clinical Data: Left flank pain and hematuria.  CT ABDOMEN AND PELVIS WITHOUT CONTRAST  Technique:  Multidetector CT imaging of the abdomen and pelvis was performed following the standard protocol without intravenous contrast.  Comparison: 09/12/2011  Findings: Lung bases are clear.  No evidence for free air. Again noted is a hiatal hernia which may be surgical in nature.  Stable 3.1 cm low density structure in the left hepatic lobe is  suggestive for a cyst. Additional small low density structures in the liver could represent small cysts.  Gallbladder has been removed.  Normal appearance of the spleen and pancreas.  Distal common bile duct measures 1.3 cm and stable.  Normal appearance of the adrenal glands.  3 mm nonobstructive stone in the right kidney mid pole.  Evidence for parapelvic and cortical cysts in the right kidney.  No evidence for right hydronephrosis or ureter stones.  Again noted are multiple left parapelvic cysts. There is a punctate stone in the mid/lower pole of the left kidney.  No evidence for left hydronephrosis.  Small amount of fluid in the urinary bladder.  No gross abnormality to the uterus.  There is pericolonic  edema and stranding around the sigmoid colon and lower descending colon.  There is diffuse diverticulosis involving the sigmoid colon and descending colon.  Findings are most compatible with acute colonic inflammation.  Small amount of edema or fluid in the left paracolic gutter.  No significant lymphadenopathy.  Multilevel degenerative facet disease in the lumbar spine.  IMPRESSION: Acute inflammation involving the sigmoid colon and lower descending colon.  Findings are most compatible with acute diverticulitis.  No evidence for a large fluid or abscess collection.  Nonobstructive kidney stones.  Bilateral renal cysts.  Hepatic cysts.  These results will be called to the ordering clinician or representative by the Radiologist Assistant, and communication documented in the PACS Dashboard.   Original Report Authenticated By: Richarda Overlie, M.D.     ASSESSMENT: Hx of breast cancer diagnosed in 2003 and treated as above. Weigh loss and night sweats are concerning for malignancy, needs further work up.  Known history of osteoporosis on yearly zoledronic acid.  She does have blood calcium in the high side of normal which makes me concerned about putting her on vitamin D and calcium which could easily precipitate  hypercalcemia.I would like to hold off on these at this time.    PLAN:  1. CT of the chest with contrast ordered today. 2. CBC CMP LDH today. 3. DEXA scan to follow the progress of osteoporosis treatment. 4. Return to clinic in 2 weeks to discuss the results.    All questions were satisfactorily answered. Patient knows to call if  any concern arises.  I spent more than 50 % counseling the patient face to face. The total time spent in the appointment was 30 minutes.   Sherral Hammers, MD FACP. Hematology/Oncology.

## 2013-03-19 DIAGNOSIS — M25519 Pain in unspecified shoulder: Secondary | ICD-10-CM | POA: Diagnosis not present

## 2013-03-21 ENCOUNTER — Ambulatory Visit (HOSPITAL_COMMUNITY)
Admission: RE | Admit: 2013-03-21 | Discharge: 2013-03-21 | Disposition: A | Discharge status: Home / Self Care | Payer: Medicare Other | Source: Ambulatory Visit | Attending: Hematology and Oncology | Admitting: Hematology and Oncology

## 2013-03-21 DIAGNOSIS — C50919 Malignant neoplasm of unspecified site of unspecified female breast: Secondary | ICD-10-CM | POA: Diagnosis not present

## 2013-03-21 DIAGNOSIS — R634 Abnormal weight loss: Secondary | ICD-10-CM | POA: Insufficient documentation

## 2013-03-21 DIAGNOSIS — R911 Solitary pulmonary nodule: Secondary | ICD-10-CM | POA: Diagnosis not present

## 2013-03-21 DIAGNOSIS — C50912 Malignant neoplasm of unspecified site of left female breast: Secondary | ICD-10-CM

## 2013-03-21 MED ORDER — IOHEXOL 300 MG/ML  SOLN
80.0000 mL | Freq: Once | INTRAMUSCULAR | Status: AC | PRN
  Administered 2013-03-21: 80 mL via INTRAVENOUS

## 2013-03-26 ENCOUNTER — Other Ambulatory Visit (HOSPITAL_COMMUNITY): Payer: Self-pay | Admitting: Oncology

## 2013-03-26 DIAGNOSIS — R911 Solitary pulmonary nodule: Secondary | ICD-10-CM

## 2013-03-31 DIAGNOSIS — M25519 Pain in unspecified shoulder: Secondary | ICD-10-CM | POA: Diagnosis not present

## 2013-04-01 ENCOUNTER — Ambulatory Visit (HOSPITAL_COMMUNITY): Payer: Medicare Other

## 2013-04-02 DIAGNOSIS — M25519 Pain in unspecified shoulder: Secondary | ICD-10-CM | POA: Diagnosis not present

## 2013-04-03 ENCOUNTER — Ambulatory Visit (HOSPITAL_COMMUNITY)
Admission: RE | Admit: 2013-04-03 | Discharge: 2013-04-03 | Disposition: A | Discharge status: Home / Self Care | Payer: Medicare Other | Source: Ambulatory Visit | Attending: Hematology and Oncology | Admitting: Hematology and Oncology

## 2013-04-03 DIAGNOSIS — M810 Age-related osteoporosis without current pathological fracture: Secondary | ICD-10-CM | POA: Diagnosis not present

## 2013-04-03 DIAGNOSIS — Z780 Asymptomatic menopausal state: Secondary | ICD-10-CM | POA: Diagnosis not present

## 2013-04-03 DIAGNOSIS — M818 Other osteoporosis without current pathological fracture: Secondary | ICD-10-CM | POA: Diagnosis not present

## 2013-04-04 ENCOUNTER — Encounter (HOSPITAL_COMMUNITY): Payer: Medicare Other | Attending: Oncology | Admitting: Oncology

## 2013-04-04 ENCOUNTER — Encounter (HOSPITAL_COMMUNITY): Payer: Self-pay | Admitting: Oncology

## 2013-04-04 DIAGNOSIS — C50919 Malignant neoplasm of unspecified site of unspecified female breast: Secondary | ICD-10-CM | POA: Diagnosis not present

## 2013-04-04 DIAGNOSIS — C50912 Malignant neoplasm of unspecified site of left female breast: Secondary | ICD-10-CM

## 2013-04-04 NOTE — Progress Notes (Signed)
Belinda Pizza, MD [place] [address]  Hypercalcemia - Plan: PTH, intact and calcium  Infiltrating lobular carcinoma, left - Plan: CBC with Differential, Comprehensive metabolic panel  CURRENT THERAPY: Surveillance for breast cancer with mammograms, Reclast every year for osteoporosis which is due in Jan 2015.  INTERVAL HISTORY: MARCHE HOTTENSTEIN 78 y.o. female returns for  regular  visit for followup of stage II A. (T2, N0, M0) infiltrating lobular carcinoma left breast diagnosed in 2003 status post partial mastectomy on 02/25/2002 with a 3.0 3.5 cm primary ER +99% PR +80% HER-2/neu nonamplified and Ki-67 marker was low at 10% surgery with negative sentinel nodes felt to be well-differentiated status post radiation therapy followed by Arimidex initially which she could not tolerate followed by letrozole and she completed 5 full years of adjuvant hormonal therapy.  I personally reviewed and went over radiographic studies with the patient.  Her bone density test was stable, but continues to illustrate osteoporosis.  She is tolerating yearly reclast and so we will continue this regimen.  We can always transition to Prolia if needed, but I did not discuss this option with her today.  Her CT scan shows a small 7-mm lingular pulmonary nodule.  We will repeat in 6 months to demonstrate resolution of this nodule.  It is favored to be infections or inflammatory.   I personally reviewed and went over laboratory results with the patient.  Her labs are stable and continues to show a minimal hypercalcemia. I will check a PTH in the future.  Her Hgb is stable in the mid-low 10 g/dl with a normal WBC and platelet count. LDH is well WNL at 135.  She otherwise denies any complaints and ROS questioning is negative.       Past Medical History  Diagnosis Date  . History of chest pain     MI ruled out  . Non-occlusive coronary artery disease 2003    by Catheterization  . History of hiatal hernia   .  History of esophageal spasm   . Irritable bowel syndrome   . Breast cancer 2003  . Syncope, non cardiac   . Osteoarthritis   . Diverticulitis     History  . HTN (hypertension)   . Hypokalemia   . Kidney stones   . Osteoporosis 09/08/2011  . Hypothyroidism   . Dyslexia   . Kidney stone   . Infiltrating lobular carcinoma 04/04/2013    has FULL INCONTINENCE OF FECES; DIARRHEA; Osteoporosis; Syncope and collapse; Chest pain; Dehydration; Acute renal failure; HTN (hypertension); Hypothyroid; GERD (gastroesophageal reflux disease); and Infiltrating lobular carcinoma on her problem list.     is allergic to metronidazole; oxycodone; and ciprofloxacin.  Ms. Kleinschmidt had no medications administered during this visit.  Past Surgical History  Procedure Laterality Date  . Thyroidectomy      Hypothyroidism status post  . Cholecystectomy  1997    Status post -stones  . Laparoscopic nissen fundoplication  1997  . Arm fx      left  . Breast lumpectomy      Left  . Knee surgery    . Kidney stone surgery  1992  . Mandible reconstruction    . Cataract extraction    . Neck surgery  2000    [facility]  . Cataract extraction w/phaco  10/02/2011    Procedure: CATARACT EXTRACTION PHACO AND INTRAOCULAR LENS PLACEMENT (IOC);  Surgeon: Gemma Payor, MD;  Location: [facility];  Service: Ophthalmology;  Laterality: Right;  CDE:17.01  .  Kidney stent  06/04/11    removed after about 3 weeks.  . Lithotripsy  11/12    Denies any headaches, dizziness, double vision, fevers, chills, night sweats, nausea, vomiting, diarrhea, constipation, chest pain, heart palpitations, shortness of breath, blood in stool, black tarry stool, urinary pain, urinary burning, urinary frequency, hematuria.   PHYSICAL EXAMINATION  ECOG PERFORMANCE STATUS: 0 - Asymptomatic  Filed Vitals:   04/04/13 1100  BP: 138/78  Pulse: 79  Temp: 98.5 F (36.9 C)  Resp: 18    GENERAL:alert, no distress, well nourished, well developed, comfortable,  cooperative and smiling SKIN: skin color, texture, turgor are normal, no rashes or significant lesions HEAD: Normocephalic, No masses, lesions, tenderness or abnormalities EYES: normal, PERRLA, EOMI, Conjunctiva are pink and non-injected EARS: External ears normal OROPHARYNX:mucous membranes are moist  NECK: supple, no adenopathy, thyroid normal size, non-tender, without nodularity, no stridor, non-tender, trachea midline LYMPH:  no palpable lymphadenopathy, no hepatosplenomegaly BREAST:not examined LUNGS: clear to auscultation and percussion HEART: regular rate & rhythm, no murmurs, no gallops, S1 normal and S2 normal ABDOMEN:abdomen soft, non-tender, normal bowel sounds, no masses or organomegaly and no hepatosplenomegaly BACK: Back symmetric, no curvature., No CVA tenderness EXTREMITIES:less then 2 second capillary refill, no joint deformities, effusion, or inflammation, no edema, no skin discoloration, no clubbing, no cyanosis  NEURO: alert & oriented x 3 with fluent speech, no focal motor/sensory deficits, gait normal    LABORATORY DATA: CBC    Component Value Date/Time   WBC 7.0 03/18/2013 1230   RBC 3.88 03/18/2013 1230   HGB 10.9* 03/18/2013 1230   HCT 33.8* 03/18/2013 1230   PLT 343 03/18/2013 1230   MCV 87.1 03/18/2013 1230   MCH 28.1 03/18/2013 1230   MCHC 32.2 03/18/2013 1230   RDW 13.7 03/18/2013 1230   LYMPHSABS 2.3 03/18/2013 1230   MONOABS 0.6 03/18/2013 1230   EOSABS 0.1 03/18/2013 1230   BASOSABS 0.0 03/18/2013 1230      Chemistry      Component Value Date/Time   NA 135 03/18/2013 1230   K 4.0 03/18/2013 1230   CL 101 03/18/2013 1230   CO2 27 03/18/2013 1230   BUN 14 03/18/2013 1230   CREATININE 0.97 03/18/2013 1230      Component Value Date/Time   CALCIUM 10.7* 03/18/2013 1230   ALKPHOS 61 03/18/2013 1230   AST 15 03/18/2013 1230   ALT 7 03/18/2013 1230   BILITOT 0.1* 03/18/2013 1230        PENDING LABS:   RADIOGRAPHIC STUDIES:  Dg Bone Density  04/03/2013    The Bone Mineral Densitometry hard-copy report (which includes all data, graphical display, and FRAX results when applicable) has been sent directly to the ordering physician.  This report can also be obtained electronically by viewing images for this exam through the performing facility's EMR, or by logging directly into [employer].   Original Report Authenticated By: Onalee Hua Call    03/31/2013  *RADIOLOGY REPORT*  Clinical Data: Follow-up breast carcinoma. Unexplained weight  loss.  CT CHEST WITH CONTRAST  Technique: Multidetector CT imaging of the chest was performed  following the standard protocol during bolus administration of  intravenous contrast.  Contrast: 80mL OMNIPAQUE IOHEXOL 300 MG/ML SOLN  Comparison: 06/02/2010  Findings: No evidence of hilar or mediastinal soft tissue masses.  No adenopathy seen elsewhere within the thorax. A moderate size  hiatal hernia is again demonstrated.  No evidence of pleural or pericardial effusion. A cluster of small  nodular densities is  seen in the lingula, largest measuring 7 mm  which was not seen on previous study. There is a tree in bud  pattern, suggesting an infectious or inflammatory etiology. No  evidence of pulmonary air space disease or other infiltrate. No  other pulmonary nodules or masses are identified.  Images of the upper abdomen show normal appearance of the adrenal  glands. Central left hepatic lobe cyst again noted.  IMPRESSION:  1. Indeterminate 7-mm lingular pulmonary nodule, with associated  tree in bud opacity, suspicious for infectious or inflammatory  etiology. This is unlikely to represent metastatic disease or  primary bronchogenic carcinoma. Follow-up by chest CT is  recommended again in 6 months.  2. Stable moderate hiatal hernia.  Original Report Authenticated By: Myles Rosenthal, M.D.    ASSESSMENT:  1. Stage II A. (T2, N0, M0) infiltrating lobular carcinoma left breast diagnosed in 2003 status post partial  mastectomy on 02/25/2002 with a 3.0 3.5 cm primary ER +99% PR +80% HER-2/neu nonamplified and Ki-67 marker was low at 10% surgery with negative sentinel nodes felt to be well-differentiated status post radiation therapy followed by Arimidex initially which she could not tolerate followed by letrozole and she completed 5 full years of adjuvant hormonal therapy. 2. Osteoporosis, on reclast yearly in January 3. 7 mm nodule in lingula of left lung, will follow-up on in 6 months. 4. Elevated calcium, will order PTH with next lab test.  Patient Active Problem List   Diagnosis Date Noted  . Infiltrating lobular carcinoma 04/04/2013  . Syncope and collapse 02/21/2012  . Chest pain 02/21/2012  . Dehydration 02/21/2012  . Acute renal failure 02/21/2012  . HTN (hypertension) 02/21/2012  . Hypothyroid 02/21/2012  . GERD (gastroesophageal reflux disease) 02/21/2012  . Osteoporosis 09/08/2011  . FULL INCONTINENCE OF FECES 11/07/2010  . DIARRHEA 11/07/2010  '   PLAN:  1. I personally reviewed and went over laboratory results with the patient. 2. I personally reviewed and went over radiographic studies with the patient. 3. Next screening mammogram is due in November 2014 4. Next Reclast infusion is due in Jan 2015 5. Repeat CT of chest without contrast in Jan 2015 to follow-up on lung nodule. 6. Continue with yearly bisphosphonate 7. Next bone density is due in 2016. 8. Labs in 6 months: CBC diff, CMET, PTH 9. Built Reclast supportive therapy plan 10. Return in 6 months and 1 year   THERAPY PLAN:  We will follow-up on her lung nodule with a repeat CT scan in 6 months, will recommend yearly screening mammogram, yearly Reclast for osteoporosis.  Will see her back in 6 months to review CT scan results.  If all is well at that time, will see her yearly.   All questions were answered. The patient knows to call the clinic with any problems, questions or concerns. We can certainly see the patient much  sooner if necessary.  Patient and plan discussed with Dr. Erline Hau and he is in agreement with the aforementioned.   Roshan Roback

## 2013-04-04 NOTE — Patient Instructions (Addendum)
[facility] Discharge Instructions  RECOMMENDATIONS MADE BY THE CONSULTANT AND ANY TEST RESULTS WILL BE SENT TO YOUR REFERRING PHYSICIAN.   Continue Zometa infusions yearly. Due again January 2015. Lab work due January 2015. CT scan again in January 2015. MD appointment after CT scan January 2015.  Thank you for choosing [facility] to provide your oncology and hematology care.  To afford each patient quality time with our providers, please arrive at least 15 minutes before your scheduled appointment time.  With your help, our goal is to use those 15 minutes to complete the necessary work-up to ensure our physicians have the information they need to help with your evaluation and healthcare recommendations.    Effective January 1st, 2014, we ask that you re-schedule your appointment with our physicians should you arrive 10 or more minutes late for your appointment.  We strive to give you quality time with our providers, and arriving late affects you and other patients whose appointments are after yours.    Again, thank you for choosing [facility].  Our hope is that these requests will decrease the amount of time that you wait before being seen by our physicians.       _____________________________________________________________  Should you have questions after your visit to [facility], please contact our office at [phone] between the hours of 8:30 a.m. and 5:00 p.m.  Voicemails left after 4:30 p.m. will not be returned until the following business day.  For prescription refill requests, have your pharmacy contact our office with your prescription refill request.

## 2013-04-09 ENCOUNTER — Encounter: Payer: Self-pay | Admitting: Internal Medicine

## 2013-04-09 DIAGNOSIS — Z4789 Encounter for other orthopedic aftercare: Secondary | ICD-10-CM | POA: Diagnosis not present

## 2013-05-06 DIAGNOSIS — R51 Headache: Secondary | ICD-10-CM | POA: Diagnosis not present

## 2013-05-06 DIAGNOSIS — M542 Cervicalgia: Secondary | ICD-10-CM | POA: Diagnosis not present

## 2013-05-07 DIAGNOSIS — Z23 Encounter for immunization: Secondary | ICD-10-CM | POA: Diagnosis not present

## 2013-05-23 IMAGING — CR DG CHEST 1V PORT
1 series · 1 of 1 positions shown · non-contrast
Comparison: Chest radiograph performed 12/27/2009

CLINICAL DATA: Chest pain.

PORTABLE CHEST - 1 VIEW

[view not recorded]
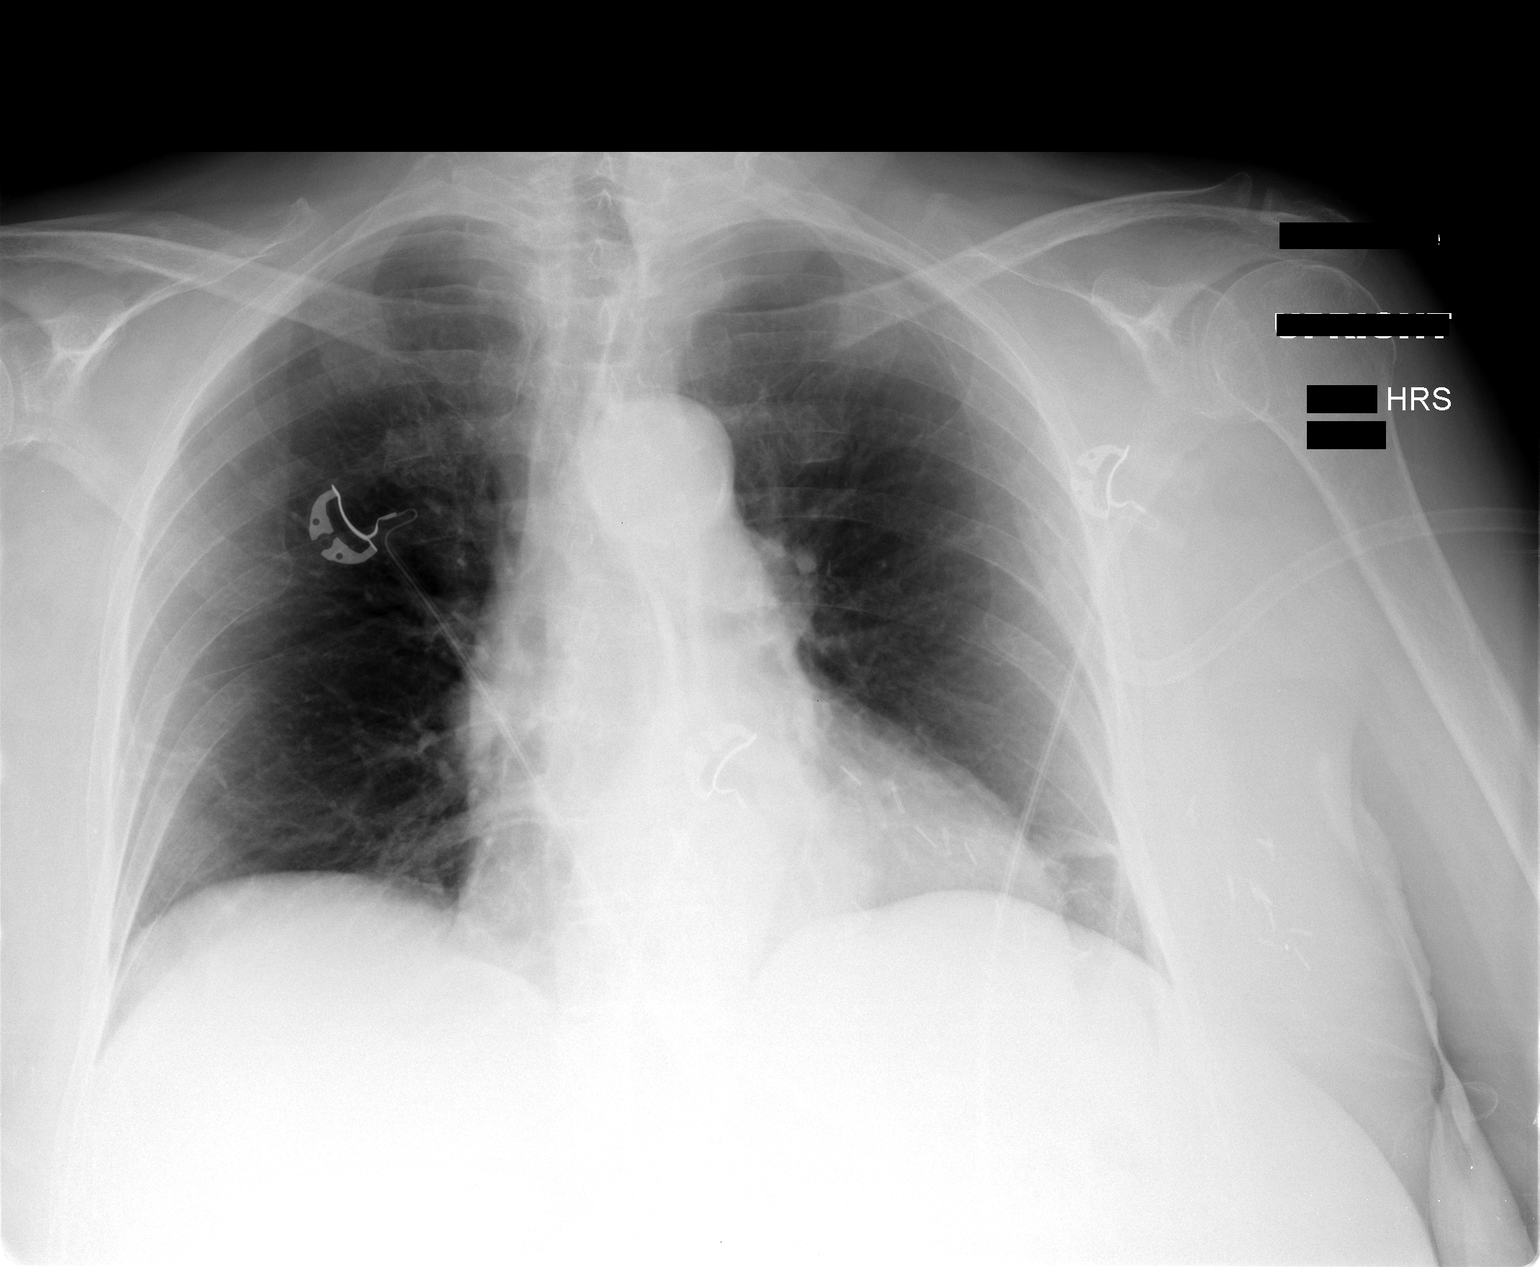

[1 of 1 positions shown; findings below may reference images not displayed]

FINDINGS: The lungs are well-aerated.  Mild left basilar
atelectasis or scarring is again noted.  There is no evidence of
pleural effusion or pneumothorax.

The cardiomediastinal silhouette is borderline normal in size;
calcification is noted within the aortic arch.  No acute osseous
abnormalities are seen.
IMPRESSION: Mild left basilar atelectasis or scarring noted; lungs otherwise
clear.

## 2013-05-26 DIAGNOSIS — I10 Essential (primary) hypertension: Secondary | ICD-10-CM | POA: Diagnosis not present

## 2013-06-02 DIAGNOSIS — E039 Hypothyroidism, unspecified: Secondary | ICD-10-CM | POA: Diagnosis not present

## 2013-06-02 DIAGNOSIS — I10 Essential (primary) hypertension: Secondary | ICD-10-CM | POA: Diagnosis not present

## 2013-06-02 DIAGNOSIS — E782 Mixed hyperlipidemia: Secondary | ICD-10-CM | POA: Diagnosis not present

## 2013-06-02 DIAGNOSIS — K219 Gastro-esophageal reflux disease without esophagitis: Secondary | ICD-10-CM | POA: Diagnosis not present

## 2013-06-13 ENCOUNTER — Other Ambulatory Visit (HOSPITAL_COMMUNITY): Payer: Self-pay | Admitting: Internal Medicine

## 2013-06-13 DIAGNOSIS — Z139 Encounter for screening, unspecified: Secondary | ICD-10-CM

## 2013-07-16 DIAGNOSIS — R51 Headache: Secondary | ICD-10-CM | POA: Diagnosis not present

## 2013-07-16 DIAGNOSIS — I10 Essential (primary) hypertension: Secondary | ICD-10-CM | POA: Diagnosis not present

## 2013-07-25 ENCOUNTER — Ambulatory Visit (HOSPITAL_COMMUNITY)
Admission: RE | Admit: 2013-07-25 | Discharge: 2013-07-25 | Disposition: A | Discharge status: Home / Self Care | Payer: Medicare Other | Source: Ambulatory Visit | Attending: Internal Medicine | Admitting: Internal Medicine

## 2013-07-25 DIAGNOSIS — Z1231 Encounter for screening mammogram for malignant neoplasm of breast: Secondary | ICD-10-CM | POA: Insufficient documentation

## 2013-07-25 DIAGNOSIS — Z139 Encounter for screening, unspecified: Secondary | ICD-10-CM

## 2013-08-14 DIAGNOSIS — I10 Essential (primary) hypertension: Secondary | ICD-10-CM | POA: Diagnosis not present

## 2013-08-14 DIAGNOSIS — E785 Hyperlipidemia, unspecified: Secondary | ICD-10-CM | POA: Diagnosis not present

## 2013-08-14 DIAGNOSIS — R51 Headache: Secondary | ICD-10-CM | POA: Diagnosis not present

## 2013-09-08 ENCOUNTER — Other Ambulatory Visit (HOSPITAL_COMMUNITY): Payer: Medicare Other

## 2013-09-08 ENCOUNTER — Ambulatory Visit (HOSPITAL_COMMUNITY): Payer: Medicare Other

## 2013-09-09 NOTE — Progress Notes (Signed)
Belinda Cahill, MD [place] [address]  Infiltrating lobular carcinoma, left - Plan: CBC with Differential, Comprehensive metabolic panel  Osteoporosis  Hypercalcemia - Plan: PTH, intact and calcium  CURRENT THERAPY: NCCN guidelines for surveillance.  Reclast annually for osteoporosis (last given on 09/27/2012).  INTERVAL HISTORY: [name] 79 y.o. female returns for  regular  visit for followup of stage II A. (T2, N0, M0) infiltrating lobular carcinoma left breast diagnosed in 2003 status post partial mastectomy on 02/25/2002 with a 3.0 x 3.5 cm primary ER +99% PR +80% HER-2/neu nonamplified and Ki-67 marker was low at 10% surgery with negative sentinel nodes felt to be well-differentiated status post radiation therapy followed by Arimidex initially, which she could not tolerate, followed by letrozole and she completed 5 full years of adjuvant hormonal therapy.  Her CT scan from 6 months ago demonstrated a small 7-mm lingular pulmonary nodule. Repeat CT scan of chest is scheduled for 09/26/2013. It is favored to be infections or inflammatory.   I personally reviewed and went over radiographic studies with the patient.  Screening mammogram in November was BIRADS 1 and she will therefore be due for her next annual mammogram in November 2015.  [name] is due for Reclast this month and therefore we will get that scheduled and ascertain updated lab work.  Supportive therapy plan is reviewed.   NCCN guidelines recommends the following surveillance for invasive breast cancer:  A. History and Physical exam every 4-6 months for 5 years and then every 12 months.  B. Mammography every 12 months  C. Women on Tamoxifen: annual gynecologic assessment every 12 months if uterus is present.  D. Women on aromatase inhibitor or who experience ovarian failure secondary to treatment should have monitoring of bone health with a bone mineral density determination at baseline and periodically thereafter.  E.  Assess and encourage adherence to adjuvant endocrine therapy.  F. Evidence suggests that active lifestyle and achieving and maintaining an ideal body weight (20-25 BMI) may lead to optimal breast cancer outcomes.  Since 2012 when [name] underwent a lithotripsy, she has been experiencing right flank pain without any clear cut cause.  She is frustrated that she tells physicians and this complaint, she feels, goes ignored.  She requests imaging studies, but she has underwent complete imaging in July 2014 with CT of chest and abd/pelvis.  When describing the discomfort, she is nonspecific.  She reports Aleve helps with the pain.  Deep palpation worsens the pain and deep inspiration.  Otherwise, oncologically, she denies any complaints and ROS questioning is negative.    Past Medical History  Diagnosis Date  . History of chest pain     MI ruled out  . Non-occlusive coronary artery disease 2003    by Catheterization  . History of hiatal hernia   . History of esophageal spasm   . Irritable bowel syndrome   . Breast cancer 2003  . Syncope, non cardiac   . Osteoarthritis   . Diverticulitis     History  . HTN (hypertension)   . Hypokalemia   . Kidney stones   . Osteoporosis 09/08/2011  . Hypothyroidism   . Dyslexia   . Kidney stone   . Infiltrating lobular carcinoma 04/04/2013    has FULL INCONTINENCE OF FECES; DIARRHEA; Osteoporosis; Syncope and collapse; Chest pain; Dehydration; Acute renal failure; HTN (hypertension); Hypothyroid; GERD (gastroesophageal reflux disease); and Infiltrating lobular carcinoma, left breast on her problem list.     is allergic to metronidazole;  oxycodone; and ciprofloxacin.  [name] had no medications administered during this visit.  Past Surgical History  Procedure Laterality Date  . Thyroidectomy      Hypothyroidism status post  . Cholecystectomy  1997    Status post -stones  . Laparoscopic nissen fundoplication  4401  . Arm fx      left  . Breast  lumpectomy      Left  . Knee surgery    . Kidney stone surgery  1992  . Mandible reconstruction    . Cataract extraction    . Neck surgery  2000    [name]  . Cataract extraction w/phaco  10/02/2011    Procedure: CATARACT EXTRACTION PHACO AND INTRAOCULAR LENS PLACEMENT (IOC);  Surgeon: [name], MD;  Location: [facility];  Service: Ophthalmology;  Laterality: Right;  CDE:17.01  . Kidney stent  06/04/11    removed after about 3 weeks.  . Lithotripsy  11/12    Denies any headaches, dizziness, double vision, fevers, chills, night sweats, nausea, vomiting, diarrhea, constipation, chest pain, heart palpitations, shortness of breath, blood in stool, black tarry stool, urinary pain, urinary burning, urinary frequency, hematuria.   PHYSICAL EXAMINATION  ECOG PERFORMANCE STATUS: 1 - Symptomatic but completely ambulatory  Filed Vitals:   09/10/13 1032  BP: 127/76  Pulse: 56  Temp: 97.8 F (36.6 C)  Resp: 20    GENERAL:alert, no distress, well nourished, well developed, comfortable, cooperative and smiling SKIN: skin color, texture, turgor are normal, no rashes or significant lesions HEAD: Normocephalic, No masses, lesions, tenderness or abnormalities EYES: normal, PERRLA, EOMI, Conjunctiva are pink and non-injected EARS: External ears normal OROPHARYNX:mucous membranes are moist  NECK: supple, trachea midline LYMPH:  not examined BREAST:patient declines to have breast exam LUNGS: clear to auscultation  HEART: regular rate & rhythm, no gallops and systolic ejection murmur heard throughout the precordium ABDOMEN:abdomen soft, non-tender and normal bowel sounds BACK: Back symmetric, no curvature.  Right #6-7 rib discomfort on deep palpation EXTREMITIES:less then 2 second capillary refill, no joint deformities, effusion, or inflammation, no edema, no skin discoloration, no clubbing, no cyanosis  NEURO: alert & oriented x 3 with fluent speech, no focal motor/sensory deficits, gait  normal    LABORATORY DATA: CBC    Component Value Date/Time   WBC 7.0 03/18/2013 1230   RBC 3.88 03/18/2013 1230   HGB 10.9* 03/18/2013 1230   HCT 33.8* 03/18/2013 1230   PLT 343 03/18/2013 1230   MCV 87.1 03/18/2013 1230   MCH 28.1 03/18/2013 1230   MCHC 32.2 03/18/2013 1230   RDW 13.7 03/18/2013 1230   LYMPHSABS 2.3 03/18/2013 1230   MONOABS 0.6 03/18/2013 1230   EOSABS 0.1 03/18/2013 1230   BASOSABS 0.0 03/18/2013 1230      Chemistry      Component Value Date/Time   NA 135 03/18/2013 1230   K 4.0 03/18/2013 1230   CL 101 03/18/2013 1230   CO2 27 03/18/2013 1230   BUN 14 03/18/2013 1230   CREATININE 0.97 03/18/2013 1230      Component Value Date/Time   CALCIUM 10.7* 03/18/2013 1230   ALKPHOS 61 03/18/2013 1230   AST 15 03/18/2013 1230   ALT 7 03/18/2013 1230   BILITOT 0.1* 03/18/2013 1230        RADIOGRAPHIC STUDIES:  07/28/2013  CLINICAL DATA: Screening.  EXAM:  DIGITAL SCREENING BILATERAL MAMMOGRAM WITH CAD  COMPARISON: Previous exam(s).  ACR Breast Density Category b: There are scattered areas of  fibroglandular density.  FINDINGS:  There are no findings suspicious for malignancy. Images were  processed with CAD.  IMPRESSION:  No mammographic evidence of malignancy. A result letter of this  screening mammogram will be mailed directly to the patient.  RECOMMENDATION:  Screening mammogram in one year. (Code:SM-B-01Y)  BI-RADS CATEGORY 1: Negative  Electronically Signed  By: Lillia Mountain M.D.  On: 07/28/2013 10:50   03/31/2013  *RADIOLOGY REPORT*  Clinical Data: Follow-up breast carcinoma. Unexplained weight  loss.  CT CHEST WITH CONTRAST  Technique: Multidetector CT imaging of the chest was performed  following the standard protocol during bolus administration of  intravenous contrast.  Contrast: 23m OMNIPAQUE IOHEXOL 300 MG/ML SOLN  Comparison: 06/02/2010  Findings: No evidence of hilar or mediastinal soft tissue masses.  No adenopathy seen elsewhere within  the thorax. A moderate size  hiatal hernia is again demonstrated.  No evidence of pleural or pericardial effusion. A cluster of small  nodular densities is seen in the lingula, largest measuring 7 mm  which was not seen on previous study. There is a tree in bud  pattern, suggesting an infectious or inflammatory etiology. No  evidence of pulmonary air space disease or other infiltrate. No  other pulmonary nodules or masses are identified.  Images of the upper abdomen show normal appearance of the adrenal  glands. Central left hepatic lobe cyst again noted.  IMPRESSION:  1. Indeterminate 7-mm lingular pulmonary nodule, with associated  tree in bud opacity, suspicious for infectious or inflammatory  etiology. This is unlikely to represent metastatic disease or  primary bronchogenic carcinoma. Follow-up by chest CT is  recommended again in 6 months.  2. Stable moderate hiatal hernia.  Original Report Authenticated By: JEarle Gell M.D.    ASSESSMENT:  1. Stage II A. (T2, N0, M0) infiltrating lobular carcinoma left breast diagnosed in 2003 status post partial mastectomy on 02/25/2002 with a 3.0 x 3.5 cm primary ER +99% PR +80% HER-2/neu nonamplified and Ki-67 marker was low at 10% surgery with negative sentinel nodes felt to be well-differentiated status post radiation therapy followed by Arimidex initially which she could not tolerate followed by letrozole and she completed 5 full years of adjuvant hormonal therapy.  2. Osteoporosis, on reclast yearly in January.  Bone Density testing due in July 2016. 3. 7 mm nodule in lingula of left lung, will follow-up in Jan 2015.  4. Elevated calcium, PTH ordered with today's labs 5. Right rib pain #6-7.  CT of chest scheduled for 09/26/2013.  If negative will perform bone scan.  Patient Active Problem List   Diagnosis Date Noted  . Infiltrating lobular carcinoma, left breast 04/04/2013  . Syncope and collapse 02/21/2012  . Chest pain 02/21/2012  .  Dehydration 02/21/2012  . Acute renal failure 02/21/2012  . HTN (hypertension) 02/21/2012  . Hypothyroid 02/21/2012  . GERD (gastroesophageal reflux disease) 02/21/2012  . Osteoporosis 09/08/2011  . FULL INCONTINENCE OF FECES 11/07/2010  . DIARRHEA 11/07/2010     PLAN:  1. I personally reviewed and went over laboratory results with the patient. 2. I personally reviewed and went over radiographic studies with the patient. 3. CT of chest without contrast on 09/26/2012 for a 6 months follow-up CT from last exam. 4. Labs today: CBC diff, CMET, PTH 5. Bone density exam due in July 2016.  Order placed.  6. Reclast IV this month. 7. Next screening mammogram is due in November 2015 8. If CT of chest does not lead to a cause of right rib pain, it would be  reasonable to perform a bone scan to rule out metastatic disease. 9. Return in 6 months for follow-up.  If follow-up CT scan is negative, will transition appointments to annually.      THERAPY PLAN:  NCCN guidelines recommends the following surveillance for invasive breast cancer:  A. History and Physical exam every 4-6 months for 5 years and then every 12 months.  B. Mammography every 12 months  C. Women on Tamoxifen: annual gynecologic assessment every 12 months if uterus is present.  D. Women on aromatase inhibitor or who experience ovarian failure secondary to treatment should have monitoring of bone health with a bone mineral density determination at baseline and periodically thereafter.  E. Assess and encourage adherence to adjuvant endocrine therapy.  F. Evidence suggests that active lifestyle and achieving and maintaining an ideal body weight (20-25 BMI) may lead to optimal breast cancer outcomes.   All questions were answered. The patient knows to call the clinic with any problems, questions or concerns. We can certainly see the patient much sooner if necessary.  Patient and plan discussed with Dr. Farrel Gobble and he is in  agreement with the aforementioned.   Capone Schwinn

## 2013-09-10 ENCOUNTER — Encounter (HOSPITAL_BASED_OUTPATIENT_CLINIC_OR_DEPARTMENT_OTHER): Payer: Medicare Other

## 2013-09-10 ENCOUNTER — Encounter (HOSPITAL_COMMUNITY): Payer: Medicare Other | Attending: Oncology | Admitting: Oncology

## 2013-09-10 VITALS — BP 127/76 | HR 56 | Temp 97.8°F | Resp 20 | Wt 150.0 lb

## 2013-09-10 DIAGNOSIS — Z09 Encounter for follow-up examination after completed treatment for conditions other than malignant neoplasm: Secondary | ICD-10-CM | POA: Insufficient documentation

## 2013-09-10 DIAGNOSIS — C50919 Malignant neoplasm of unspecified site of unspecified female breast: Secondary | ICD-10-CM | POA: Diagnosis not present

## 2013-09-10 DIAGNOSIS — Z853 Personal history of malignant neoplasm of breast: Secondary | ICD-10-CM | POA: Insufficient documentation

## 2013-09-10 DIAGNOSIS — I10 Essential (primary) hypertension: Secondary | ICD-10-CM | POA: Diagnosis not present

## 2013-09-10 DIAGNOSIS — M810 Age-related osteoporosis without current pathological fracture: Secondary | ICD-10-CM | POA: Insufficient documentation

## 2013-09-10 DIAGNOSIS — I2510 Atherosclerotic heart disease of native coronary artery without angina pectoris: Secondary | ICD-10-CM | POA: Insufficient documentation

## 2013-09-10 DIAGNOSIS — C50912 Malignant neoplasm of unspecified site of left female breast: Secondary | ICD-10-CM

## 2013-09-10 LAB — CBC WITH DIFFERENTIAL/PLATELET
BASOS ABS: 0 10*3/uL (ref 0.0–0.1)
BASOS PCT: 0 % (ref 0–1)
EOS ABS: 0.2 10*3/uL (ref 0.0–0.7)
EOS PCT: 2 % (ref 0–5)
HCT: 36.9 % (ref 36.0–46.0)
Hemoglobin: 11.9 g/dL — ABNORMAL LOW (ref 12.0–15.0)
LYMPHS ABS: 2.6 10*3/uL (ref 0.7–4.0)
Lymphocytes Relative: 29 % (ref 12–46)
MCH: 28.6 pg (ref 26.0–34.0)
MCHC: 32.2 g/dL (ref 30.0–36.0)
MCV: 88.7 fL (ref 78.0–100.0)
Monocytes Absolute: 0.6 10*3/uL (ref 0.1–1.0)
Monocytes Relative: 6 % (ref 3–12)
NEUTROS PCT: 63 % (ref 43–77)
Neutro Abs: 5.8 10*3/uL (ref 1.7–7.7)
PLATELETS: 271 10*3/uL (ref 150–400)
RBC: 4.16 MIL/uL (ref 3.87–5.11)
RDW: 13.7 % (ref 11.5–15.5)
WBC: 9.2 10*3/uL (ref 4.0–10.5)

## 2013-09-10 LAB — COMPREHENSIVE METABOLIC PANEL
ALBUMIN: 3.4 g/dL — AB (ref 3.5–5.2)
ALK PHOS: 64 U/L (ref 39–117)
ALT: 14 U/L (ref 0–35)
AST: 20 U/L (ref 0–37)
BUN: 19 mg/dL (ref 6–23)
CALCIUM: 10.2 mg/dL (ref 8.4–10.5)
CO2: 27 mEq/L (ref 19–32)
Chloride: 104 mEq/L (ref 96–112)
Creatinine, Ser: 0.78 mg/dL (ref 0.50–1.10)
GFR calc non Af Amer: 79 mL/min — ABNORMAL LOW (ref >90)
GLUCOSE: 87 mg/dL (ref 70–99)
POTASSIUM: 4.7 meq/L (ref 3.7–5.3)
SODIUM: 141 meq/L (ref 137–147)
TOTAL PROTEIN: 7.2 g/dL (ref 6.0–8.3)
Total Bilirubin: 0.3 mg/dL (ref 0.3–1.2)

## 2013-09-10 NOTE — Progress Notes (Signed)
Labs drawn today for cbc/diff,cmp,pth intact

## 2013-09-10 NOTE — Patient Instructions (Signed)
[place] Discharge Instructions  RECOMMENDATIONS MADE BY THE CONSULTANT AND ANY TEST RESULTS WILL BE SENT TO YOUR REFERRING PHYSICIAN.  EXAM FINDINGS BY THE PHYSICIAN TODAY AND SIGNS OR SYMPTOMS TO REPORT TO CLINIC OR PRIMARY PHYSICIAN:   Your Reclast will need to be given this month.  Labs today: CBC diff, CMET, Parathyroid hormone  Return in 6 months to see MD    Thank you for choosing [facility] to provide your oncology and hematology care.  To afford each patient quality time with our providers, please arrive at least 15 minutes before your scheduled appointment time.  With your help, our goal is to use those 15 minutes to complete the necessary work-up to ensure our physicians have the information they need to help with your evaluation and healthcare recommendations.    Effective January 1st, 2014, we ask that you re-schedule your appointment with our physicians should you arrive 10 or more minutes late for your appointment.  We strive to give you quality time with our providers, and arriving late affects you and other patients whose appointments are after yours.    Again, thank you for choosing [facility].  Our hope is that these requests will decrease the amount of time that you wait before being seen by our physicians.       _____________________________________________________________  Should you have questions after your visit to [facility], please contact our office at [phone] between the hours of 8:30 a.m. and 5:00 p.m.  Voicemails left after 4:30 p.m. will not be returned until the following business day.  For prescription refill requests, have your pharmacy contact our office with your prescription refill request.

## 2013-09-10 NOTE — Progress Notes (Signed)
Patient having arthritic pain in her rt kidney area ever since she had a lithotripsy in October 2012. Patient has told doctor after doctor. She thinks the pain is having from this lithotripsy from Oct 2012. She wants to know if she can have a CT scan of her abdomen in addition to her CT of the chest as well to see what is going on.

## 2013-09-11 LAB — PTH, INTACT AND CALCIUM
CALCIUM TOTAL (PTH): 9.8 mg/dL (ref 8.4–10.5)
PTH: 136.9 pg/mL — ABNORMAL HIGH (ref 14.0–72.0)

## 2013-09-12 ENCOUNTER — Other Ambulatory Visit (HOSPITAL_COMMUNITY): Payer: Self-pay | Admitting: Oncology

## 2013-09-12 DIAGNOSIS — E349 Endocrine disorder, unspecified: Secondary | ICD-10-CM

## 2013-09-16 ENCOUNTER — Encounter (HOSPITAL_BASED_OUTPATIENT_CLINIC_OR_DEPARTMENT_OTHER): Payer: Medicare Other

## 2013-09-16 VITALS — BP 135/75 | HR 68 | Temp 98.0°F | Resp 18

## 2013-09-16 DIAGNOSIS — M810 Age-related osteoporosis without current pathological fracture: Secondary | ICD-10-CM

## 2013-09-16 MED ORDER — SODIUM CHLORIDE 0.9 % IJ SOLN
10.0000 mL | INTRAMUSCULAR | Status: DC | PRN
  Administered 2013-09-16: 10 mL

## 2013-09-16 MED ORDER — ZOLEDRONIC ACID 5 MG/100ML IV SOLN
5.0000 mg | Freq: Once | INTRAVENOUS | Status: AC
  Administered 2013-09-16: 5 mg via INTRAVENOUS
  Filled 2013-09-16: qty 100

## 2013-09-16 MED ORDER — SODIUM CHLORIDE 0.9 % IV SOLN
Freq: Once | INTRAVENOUS | Status: AC
  Administered 2013-09-16: 11:00:00 via INTRAVENOUS

## 2013-09-16 MED ORDER — ZOLEDRONIC ACID 4 MG/5ML IV CONC: 5.0000 mg | Freq: Once | INTRAVENOUS | Status: DC

## 2013-09-23 DIAGNOSIS — E039 Hypothyroidism, unspecified: Secondary | ICD-10-CM | POA: Diagnosis not present

## 2013-09-23 DIAGNOSIS — I10 Essential (primary) hypertension: Secondary | ICD-10-CM | POA: Diagnosis not present

## 2013-09-25 DIAGNOSIS — E039 Hypothyroidism, unspecified: Secondary | ICD-10-CM | POA: Diagnosis not present

## 2013-09-25 DIAGNOSIS — E782 Mixed hyperlipidemia: Secondary | ICD-10-CM | POA: Diagnosis not present

## 2013-09-25 DIAGNOSIS — I10 Essential (primary) hypertension: Secondary | ICD-10-CM | POA: Diagnosis not present

## 2013-09-25 DIAGNOSIS — D649 Anemia, unspecified: Secondary | ICD-10-CM | POA: Diagnosis not present

## 2013-09-26 ENCOUNTER — Ambulatory Visit (HOSPITAL_COMMUNITY)
Admission: RE | Admit: 2013-09-26 | Discharge: 2013-09-26 | Disposition: A | Discharge status: Home / Self Care | Payer: Medicare Other | Source: Ambulatory Visit | Attending: Oncology | Admitting: Oncology

## 2013-09-26 DIAGNOSIS — Z853 Personal history of malignant neoplasm of breast: Secondary | ICD-10-CM | POA: Diagnosis not present

## 2013-09-26 DIAGNOSIS — E349 Endocrine disorder, unspecified: Secondary | ICD-10-CM | POA: Diagnosis not present

## 2013-09-26 DIAGNOSIS — R599 Enlarged lymph nodes, unspecified: Secondary | ICD-10-CM | POA: Diagnosis not present

## 2013-09-26 DIAGNOSIS — R911 Solitary pulmonary nodule: Secondary | ICD-10-CM | POA: Diagnosis not present

## 2013-09-26 DIAGNOSIS — I2510 Atherosclerotic heart disease of native coronary artery without angina pectoris: Secondary | ICD-10-CM | POA: Diagnosis not present

## 2013-09-26 DIAGNOSIS — R918 Other nonspecific abnormal finding of lung field: Secondary | ICD-10-CM | POA: Diagnosis not present

## 2013-09-30 ENCOUNTER — Other Ambulatory Visit (HOSPITAL_COMMUNITY): Payer: Self-pay | Admitting: Oncology

## 2013-09-30 DIAGNOSIS — R7989 Other specified abnormal findings of blood chemistry: Secondary | ICD-10-CM

## 2013-10-23 DIAGNOSIS — I10 Essential (primary) hypertension: Secondary | ICD-10-CM | POA: Diagnosis not present

## 2013-10-23 DIAGNOSIS — R229 Localized swelling, mass and lump, unspecified: Secondary | ICD-10-CM | POA: Diagnosis not present

## 2013-10-26 ENCOUNTER — Inpatient Hospital Stay (HOSPITAL_COMMUNITY)
Admission: EM | Admit: 2013-10-26 | Discharge: 2013-10-27 | DRG: 392 | Disposition: A | Discharge status: Home / Self Care | Payer: Medicare Other | Attending: Internal Medicine | Admitting: Internal Medicine

## 2013-10-26 ENCOUNTER — Encounter (HOSPITAL_COMMUNITY): Payer: Self-pay | Admitting: Emergency Medicine

## 2013-10-26 ENCOUNTER — Emergency Department (HOSPITAL_COMMUNITY): Payer: Medicare Other

## 2013-10-26 DIAGNOSIS — E039 Hypothyroidism, unspecified: Secondary | ICD-10-CM | POA: Diagnosis present

## 2013-10-26 DIAGNOSIS — K56609 Unspecified intestinal obstruction, unspecified as to partial versus complete obstruction: Secondary | ICD-10-CM | POA: Diagnosis present

## 2013-10-26 DIAGNOSIS — K5732 Diverticulitis of large intestine without perforation or abscess without bleeding: Secondary | ICD-10-CM | POA: Diagnosis not present

## 2013-10-26 DIAGNOSIS — K5792 Diverticulitis of intestine, part unspecified, without perforation or abscess without bleeding: Secondary | ICD-10-CM | POA: Diagnosis present

## 2013-10-26 DIAGNOSIS — M1990 Unspecified osteoarthritis, unspecified site: Secondary | ICD-10-CM | POA: Diagnosis present

## 2013-10-26 DIAGNOSIS — Z853 Personal history of malignant neoplasm of breast: Secondary | ICD-10-CM | POA: Diagnosis not present

## 2013-10-26 DIAGNOSIS — K589 Irritable bowel syndrome without diarrhea: Secondary | ICD-10-CM | POA: Diagnosis present

## 2013-10-26 DIAGNOSIS — K219 Gastro-esophageal reflux disease without esophagitis: Secondary | ICD-10-CM | POA: Diagnosis present

## 2013-10-26 DIAGNOSIS — E860 Dehydration: Secondary | ICD-10-CM | POA: Diagnosis present

## 2013-10-26 DIAGNOSIS — M810 Age-related osteoporosis without current pathological fracture: Secondary | ICD-10-CM | POA: Diagnosis present

## 2013-10-26 DIAGNOSIS — R5381 Other malaise: Secondary | ICD-10-CM

## 2013-10-26 DIAGNOSIS — I10 Essential (primary) hypertension: Secondary | ICD-10-CM | POA: Diagnosis present

## 2013-10-26 DIAGNOSIS — R109 Unspecified abdominal pain: Secondary | ICD-10-CM | POA: Diagnosis not present

## 2013-10-26 DIAGNOSIS — N179 Acute kidney failure, unspecified: Secondary | ICD-10-CM | POA: Diagnosis not present

## 2013-10-26 DIAGNOSIS — E876 Hypokalemia: Secondary | ICD-10-CM | POA: Diagnosis present

## 2013-10-26 DIAGNOSIS — I2510 Atherosclerotic heart disease of native coronary artery without angina pectoris: Secondary | ICD-10-CM | POA: Diagnosis present

## 2013-10-26 DIAGNOSIS — K7689 Other specified diseases of liver: Secondary | ICD-10-CM | POA: Diagnosis present

## 2013-10-26 DIAGNOSIS — R531 Weakness: Secondary | ICD-10-CM

## 2013-10-26 DIAGNOSIS — D649 Anemia, unspecified: Secondary | ICD-10-CM | POA: Diagnosis present

## 2013-10-26 DIAGNOSIS — R5383 Other fatigue: Secondary | ICD-10-CM

## 2013-10-26 DIAGNOSIS — C50919 Malignant neoplasm of unspecified site of unspecified female breast: Secondary | ICD-10-CM | POA: Diagnosis not present

## 2013-10-26 LAB — CBC WITH DIFFERENTIAL/PLATELET
BASOS PCT: 0 % (ref 0–1)
Basophils Absolute: 0 10*3/uL (ref 0.0–0.1)
EOS PCT: 1 % (ref 0–5)
Eosinophils Absolute: 0.2 10*3/uL (ref 0.0–0.7)
HCT: 35.9 % — ABNORMAL LOW (ref 36.0–46.0)
Hemoglobin: 12 g/dL (ref 12.0–15.0)
Lymphocytes Relative: 11 % — ABNORMAL LOW (ref 12–46)
Lymphs Abs: 1.3 10*3/uL (ref 0.7–4.0)
MCH: 28.7 pg (ref 26.0–34.0)
MCHC: 33.4 g/dL (ref 30.0–36.0)
MCV: 85.9 fL (ref 78.0–100.0)
Monocytes Absolute: 0.8 10*3/uL (ref 0.1–1.0)
Monocytes Relative: 7 % (ref 3–12)
NEUTROS PCT: 80 % — AB (ref 43–77)
Neutro Abs: 9 10*3/uL — ABNORMAL HIGH (ref 1.7–7.7)
PLATELETS: 150 10*3/uL (ref 150–400)
RBC: 4.18 MIL/uL (ref 3.87–5.11)
RDW: 14 % (ref 11.5–15.5)
WBC: 11.3 10*3/uL — ABNORMAL HIGH (ref 4.0–10.5)

## 2013-10-26 LAB — HEPATIC FUNCTION PANEL
ALK PHOS: 86 U/L (ref 39–117)
ALT: 22 U/L (ref 0–35)
AST: 26 U/L (ref 0–37)
Albumin: 3.2 g/dL — ABNORMAL LOW (ref 3.5–5.2)
BILIRUBIN TOTAL: 0.3 mg/dL (ref 0.3–1.2)
Bilirubin, Direct: <0.2 mg/dL (ref 0.0–0.3)
TOTAL PROTEIN: 7.3 g/dL (ref 6.0–8.3)

## 2013-10-26 LAB — BASIC METABOLIC PANEL
BUN: 37 mg/dL — ABNORMAL HIGH (ref 6–23)
CALCIUM: 10.1 mg/dL (ref 8.4–10.5)
CO2: 24 mEq/L (ref 19–32)
Chloride: 99 mEq/L (ref 96–112)
Creatinine, Ser: 1.25 mg/dL — ABNORMAL HIGH (ref 0.50–1.10)
GFR calc non Af Amer: 41 mL/min — ABNORMAL LOW (ref >90)
GFR, EST AFRICAN AMERICAN: 47 mL/min — AB (ref >90)
Glucose, Bld: 108 mg/dL — ABNORMAL HIGH (ref 70–99)
POTASSIUM: 3.2 meq/L — AB (ref 3.7–5.3)
SODIUM: 137 meq/L (ref 137–147)

## 2013-10-26 LAB — URINE MICROSCOPIC-ADD ON

## 2013-10-26 LAB — URINALYSIS, ROUTINE W REFLEX MICROSCOPIC
BILIRUBIN URINE: NEGATIVE
Glucose, UA: NEGATIVE mg/dL
KETONES UR: NEGATIVE mg/dL
NITRITE: NEGATIVE
Specific Gravity, Urine: 1.015 (ref 1.005–1.030)
Urobilinogen, UA: 0.2 mg/dL (ref 0.0–1.0)
pH: 5.5 (ref 5.0–8.0)

## 2013-10-26 LAB — LIPASE, BLOOD: LIPASE: 18 U/L (ref 11–59)

## 2013-10-26 MED ORDER — IOHEXOL 300 MG/ML  SOLN
50.0000 mL | Freq: Once | INTRAMUSCULAR | Status: AC | PRN
  Administered 2013-10-26: 50 mL via ORAL

## 2013-10-26 MED ORDER — LEVOTHYROXINE SODIUM 88 MCG PO TABS
88.0000 ug | ORAL_TABLET | Freq: Every day | ORAL | Status: DC
  Administered 2013-10-27: 88 ug via ORAL
  Filled 2013-10-26 (×3): qty 1

## 2013-10-26 MED ORDER — ONDANSETRON HCL 4 MG PO TABS: 4.0000 mg | ORAL_TABLET | Freq: Four times a day (QID) | ORAL | Status: DC | PRN

## 2013-10-26 MED ORDER — SODIUM CHLORIDE 0.9 % IV SOLN
3.0000 g | Freq: Once | INTRAVENOUS | Status: AC
  Administered 2013-10-26: 3 g via INTRAVENOUS
  Filled 2013-10-26: qty 3

## 2013-10-26 MED ORDER — IOHEXOL 300 MG/ML  SOLN
100.0000 mL | Freq: Once | INTRAMUSCULAR | Status: AC | PRN
  Administered 2013-10-26: 100 mL via INTRAVENOUS

## 2013-10-26 MED ORDER — PANTOPRAZOLE SODIUM 40 MG PO TBEC
40.0000 mg | DELAYED_RELEASE_TABLET | Freq: Every day | ORAL | Status: DC
Start: 2013-10-26 — End: 2013-10-27
  Administered 2013-10-26 – 2013-10-27 (×2): 40 mg via ORAL
  Filled 2013-10-26 (×2): qty 1

## 2013-10-26 MED ORDER — ONDANSETRON HCL 4 MG/2ML IJ SOLN: 4.0000 mg | Freq: Four times a day (QID) | INTRAMUSCULAR | Status: DC | PRN

## 2013-10-26 MED ORDER — SIMVASTATIN 10 MG PO TABS
10.0000 mg | ORAL_TABLET | Freq: Every day | ORAL | Status: DC
  Administered 2013-10-26: 10 mg via ORAL
  Filled 2013-10-26: qty 1

## 2013-10-26 MED ORDER — ACETAMINOPHEN 325 MG PO TABS: 650.0000 mg | ORAL_TABLET | Freq: Four times a day (QID) | ORAL | Status: DC | PRN

## 2013-10-26 MED ORDER — SODIUM CHLORIDE 0.9 % IV SOLN
1.5000 g | Freq: Four times a day (QID) | INTRAVENOUS | Status: DC
  Administered 2013-10-26 – 2013-10-27 (×3): 1.5 g via INTRAVENOUS
  Filled 2013-10-26 (×8): qty 1.5

## 2013-10-26 MED ORDER — ACETAMINOPHEN 650 MG RE SUPP: 650.0000 mg | Freq: Four times a day (QID) | RECTAL | Status: DC | PRN

## 2013-10-26 MED ORDER — ENOXAPARIN SODIUM 40 MG/0.4ML ~~LOC~~ SOLN: 40.0000 mg | SUBCUTANEOUS | Status: DC

## 2013-10-26 MED ORDER — SODIUM CHLORIDE 0.9 % IV BOLUS (SEPSIS)
1000.0000 mL | Freq: Once | INTRAVENOUS | Status: AC
  Administered 2013-10-26: 1000 mL via INTRAVENOUS

## 2013-10-26 MED ORDER — SODIUM CHLORIDE 0.9 % IV SOLN
INTRAVENOUS | Status: AC
  Filled 2013-10-26 (×2): qty 1.5

## 2013-10-26 MED ORDER — POTASSIUM CHLORIDE CRYS ER 20 MEQ PO TBCR
40.0000 meq | EXTENDED_RELEASE_TABLET | Freq: Two times a day (BID) | ORAL | Status: AC
  Administered 2013-10-26 – 2013-10-27 (×2): 40 meq via ORAL
  Filled 2013-10-26 (×2): qty 2

## 2013-10-26 MED ORDER — SODIUM CHLORIDE 0.9 % IV SOLN
INTRAVENOUS | Status: DC
  Administered 2013-10-26 – 2013-10-27 (×2): via INTRAVENOUS

## 2013-10-26 NOTE — ED Notes (Addendum)
Pt reports had violent chills Thursday night and slept all day Friday.  C/O generalized weakness and pain in r flank around to abd.  Denies any pain or burning with urination.  Also reports r leg feels numb since waking this morning.

## 2013-10-26 NOTE — H&P (Signed)
History and Physical  [name] LGX:[id number] DOB: 12-20-1934 DOA: 10/26/2013  Referring physician: Dr. Regenia Skeeter in ED PCP: [name], MD   Chief Complaint: Generalized weakness  HPI:  79 year old woman with a 4 day history of chills, generalized weakness, nausea and vomiting presented to the emergency department where initial evaluation suggested acute renal failure, dehydration and diverticulitis without complicating features.  Patient reports she became ill after going out to eat 2/19. She had an episode of vomiting and shaking chills all that evening. The following day she spent the entire day in bed and did not eat or drink. Yesterday she had diarrhea. Today she felt so generally weak that she could not walk and so she came to the emergency department. She has had some abdominal pain mild. No specific aggravating or alleviating factors.  In the emergency department afebrile, vital signs stable. No hypoxia.treated with Unasyn. Basic metabolic panel revealed potassium 3.2, elevated BUN and creatinine above baseline 37/1.25. Hepatic function panel unremarkable. Lipase normal. CBC with modest elevation of WBC otherwise unremarkable. Chest x-ray unremarkable. CT abdomen and pelvis revealed mild diverticulitis. No abscess or complicating features.   Review of Systems:  Negative for fever, visual changes, sore throat, rash, new muscle aches, chest pain, SOB, dysuria, bleeding.  Past Medical History  Diagnosis Date  . History of chest pain     MI ruled out  . Non-occlusive coronary artery disease 2003    by Catheterization  . History of hiatal hernia   . History of esophageal spasm   . Irritable bowel syndrome   . Breast cancer 2003  . Syncope, non cardiac   . Osteoarthritis   . Diverticulitis     History  . HTN (hypertension)   . Hypokalemia   . Kidney stones   . Osteoporosis 09/08/2011  . Hypothyroidism   . Dyslexia   . Kidney stone   . Infiltrating lobular carcinoma  04/04/2013    Past Surgical History  Procedure Laterality Date  . Thyroidectomy      Hypothyroidism status post  . Cholecystectomy  1997    Status post -stones  . Laparoscopic nissen fundoplication  8144  . Arm fx      left  . Breast lumpectomy      Left  . Knee surgery      bilateral  . Kidney stone surgery  1992  . Mandible reconstruction    . Cataract extraction    . Neck surgery  2000    [facility]  . Cataract extraction w/phaco  10/02/2011    Procedure: CATARACT EXTRACTION PHACO AND INTRAOCULAR LENS PLACEMENT (IOC);  Surgeon: [name], MD;  Location: [facility];  Service: Ophthalmology;  Laterality: Right;  CDE:17.01  . Kidney stent  06/04/11    removed after about 3 weeks.  . Lithotripsy  11/12  . Tubal ligation      Social History:  reports that she has never smoked. She has never used smokeless tobacco. She reports that she does not drink alcohol or use illicit drugs.  Allergies  Allergen Reactions  . Metronidazole Other (See Comments)    No taste in mouth and lost 32 lbs  . Oxycodone Other (See Comments)     delusions  . Ciprofloxacin Rash    Family History  Problem Relation Age of Onset  . Anesthesia problems Neg Hx   . Hypotension Neg Hx   . Pseudochol deficiency Neg Hx   . Malignant hyperthermia Neg Hx   . Macular degeneration Sister  Prior to Admission medications   Medication Sig Start Date End Date Taking? Authorizing Provider  Cyanocobalamin (VITAMIN B-12) 2500 MCG SUBL Place 1 tablet under the tongue daily.   Yes Historical Provider, MD  esomeprazole (NEXIUM) 40 MG capsule Take 40 mg by mouth daily.    Yes Historical Provider, MD  levothyroxine (SYNTHROID, LEVOTHROID) 88 MCG tablet Take 88 mcg by mouth daily before breakfast.   Yes Historical Provider, MD  Multiple Vitamins-Minerals (CENTRUM SILVER ADULT 50+ PO) Take 1 capsule by mouth daily.   Yes Historical Provider, MD  olmesartan (BENICAR) 20 MG tablet Take 20 mg by mouth daily.   Yes Historical  Provider, MD  pravastatin (PRAVACHOL) 20 MG tablet Take 20 mg by mouth daily.   Yes Historical Provider, MD   Physical Exam: Filed Vitals:   10/26/13 0900 10/26/13 1215 10/26/13 1426 10/26/13 1430  BP: 116/64 113/61 114/65   Pulse: 118 78 72 70  Temp:  98.3 F (36.8 C)    TempSrc:  Oral    Resp: 13 18 18 13   Height:      Weight:      SpO2: 95% 99% 98% 96%    General:  examined in the emergency department.  Appears calm and comfortable Eyes: PERRL, normal lids, irises  ENT: grossly normal hearing, lips & tongue Neck: no LAD, masses or thyromegaly Cardiovascular: RRR, no m/r/g. No LE edema. Respiratory: CTA bilaterally, no w/r/r. Normal respiratory effort. Abdomen: soft, ntnd Skin: no rash or induration seen on limited exam Musculoskeletal: grossly normal tone BUE/BLE Psychiatric: grossly normal mood and affect, speech fluent and appropriate Neurologic: grossly non-focal.  Wt Readings from Last 3 Encounters:  10/26/13 68.493 kg (151 lb)  09/10/13 68.04 kg (150 lb)  04/04/13 65.318 kg (144 lb)    Labs on Admission:  Basic Metabolic Panel:  Recent Labs Lab 10/26/13 0846  NA 137  K 3.2*  CL 99  CO2 24  GLUCOSE 108*  BUN 37*  CREATININE 1.25*  CALCIUM 10.1    Liver Function Tests:  Recent Labs Lab 10/26/13 0848  AST 26  ALT 22  ALKPHOS 86  BILITOT 0.3  PROT 7.3  ALBUMIN 3.2*    Recent Labs Lab 10/26/13 0848  LIPASE 18   CBC:  Recent Labs Lab 10/26/13 0846  WBC 11.3*  NEUTROABS 9.0*  HGB 12.0  HCT 35.9*  MCV 85.9  PLT 150     Radiological Exams on Admission: Dg Chest 2 View  10/26/2013   CLINICAL DATA:  Weakness, breast cancer  EXAM: CHEST  2 VIEW  COMPARISON:  12/31/2012, 09/26/2013  FINDINGS: Stable postoperative changes of the left breast and axilla. Mild cardiomegaly. Mild hyperinflation without focal pneumonia or edema. No collapse, consolidation, effusion or pneumothorax. Trachea midline. Hiatal hernia noted. Degenerative changes of  the spine.  IMPRESSION: Stable postoperative and chronic findings. No superimposed acute process   Electronically Signed   By: Daryll Brod M.D.   On: 10/26/2013 10:24   Ct Abdomen Pelvis W Contrast  10/26/2013   CLINICAL DATA:  Left-sided abdominal and pelvic pain. Right CVA tenderness. Breast carcinoma.  EXAM: CT ABDOMEN AND PELVIS WITH CONTRAST  TECHNIQUE: Multidetector CT imaging of the abdomen and pelvis was performed using the standard protocol following bolus administration of intravenous contrast.  CONTRAST:  167mL OMNIPAQUE IOHEXOL 300 MG/ML  SOLN  COMPARISON:  Noncontrast CT on 03/13/2013  FINDINGS: Several hepatic cyst remains stable. A 1 cm hypervascular lesion is seen in the lateral segment left hepatic lobe  on image 19 which is indeterminate. Differential diagnosis includes benign hypervascular lesion and metastasis.  Prior cholecystectomy noted. Prominence of extrahepatic common bile duct is stable. The pancreas, spleen, and adrenal glands are normal in appearance. Renal cysts are noted bilaterally, but there is no evidence of renal masses or hydronephrosis. Moderate size hiatal hernia again noted.  No other abdominal or pelvic soft tissue masses are seen. Uterus is unremarkable as well as adnexal regions. Colonic diverticulosis is again demonstrated. Mild wall thickening is seen involving the distal descending colon, suspicious for mild diverticulitis, although this has decreased since previous study. No evidence of abscess or free fluid. No evidence of bowel obstruction.  IMPRESSION: Mild diverticulitis involving the distal descending colon. No evidence of abscess or other complication.  1 cm indeterminate hypervascular lesion in the left hepatic lobe. Differential diagnosis includes benign hypervascular lesion and metastasis. Recommend further imaging characterization with abdomen MRI without and with contrast on nonemergent basis.   Electronically Signed   By: Earle Gell M.D.   On: 10/26/2013  11:53    EKG: Independently reviewed. Normal sinus rhythm left axis deviation. No acute changes.    Principal Problem:   Diverticulitis Active Problems:   Dehydration   Acute renal failure   Generalized weakness   Assessment/Plan 1. Acute diverticulitis. Appears nontoxic, no complicating features on CT. 2. Acute renal failure, possibly complicated by ARB. secondary to dehydration, secondary to poor oral intake 3. Hypokalemia secondary to poor oral intake. 4. Generalized weakness 5. Hypervascular lesion left hepatic lobe. Outpatient MRI with and without contrast recommended.   Admit for IV antibiotics. Send stool studies.  IV fluids, repeat basic metabolic panel in the morning, replace potassium  PT consult  Code Status: full code DVT prophylaxis:Lovenox Family Communication: none present Disposition Plan/Anticipated LOS: admit 2 days  Time spent: 50 minutes  Murray Hodgkins, MD  [facility] Pager [phone] 10/26/2013, 3:19 PM

## 2013-10-26 NOTE — Progress Notes (Signed)
ANTIBIOTIC CONSULT NOTE - INITIAL  Pharmacy Consult for Unasyn Indication: diverticulitis  Allergies  Allergen Reactions  . Metronidazole Other (See Comments)    No taste in mouth and lost 32 lbs  . Oxycodone Other (See Comments)     delusions  . Ciprofloxacin Rash    Patient Measurements: Height: 5\' 6"  (167.6 cm) Weight: 151 lb (68.493 kg) IBW/kg (Calculated) : 59.3 Adjusted Body Weight:   Vital Signs: Temp: 98.3 F (36.8 C) (02/22 1215) Temp src: Oral (02/22 1215) BP: 114/65 mmHg (02/22 1426) Pulse Rate: 70 (02/22 1430) Intake/Output from previous day:   Intake/Output from this shift:    Labs:  Recent Labs  10/26/13 0846  WBC 11.3*  HGB 12.0  PLT 150  CREATININE 1.25*   Estimated Creatinine Clearance: 35.8 ml/min (by C-G formula based on Cr of 1.25). No results found for this basename: VANCOTROUGH, VANCOPEAK, VANCORANDOM, GENTTROUGH, GENTPEAK, GENTRANDOM, TOBRATROUGH, TOBRAPEAK, TOBRARND, AMIKACINPEAK, AMIKACINTROU, AMIKACIN,  in the last 72 hours   Microbiology: No results found for this or any previous visit (from the past 720 hour(s)).  Medical History: Past Medical History  Diagnosis Date  . History of chest pain     MI ruled out  . Non-occlusive coronary artery disease 2003    by Catheterization  . History of hiatal hernia   . History of esophageal spasm   . Irritable bowel syndrome   . Breast cancer 2003  . Syncope, non cardiac   . Osteoarthritis   . Diverticulitis     History  . HTN (hypertension)   . Hypokalemia   . Kidney stones   . Osteoporosis 09/08/2011  . Hypothyroidism   . Dyslexia   . Kidney stone   . Infiltrating lobular carcinoma 04/04/2013    Medications:  Scheduled:  . ampicillin-sulbactam (UNASYN) IV  1.5 g Intravenous Q6H  . enoxaparin (LOVENOX) injection  40 mg Subcutaneous Q24H  . [START ON 10/27/2013] levothyroxine  88 mcg Oral QAC breakfast  . pantoprazole  40 mg Oral Daily  . potassium chloride  40 mEq Oral BID  .  simvastatin  10 mg Oral q1800   Assessment: Reduced renal function, CrCl 35.8 ml/min Unasyn 3 GM IV given in ED  Goal of Therapy:  eradicate infection  Plan:  Unasyn 1.5 GM IV every 6 hours Monitor renal function Labs per protocol  Abner Greenspan, Peyten Punches Bennett 10/26/2013,4:40 PM

## 2013-10-26 NOTE — ED Provider Notes (Signed)
CSN: [id number]     Arrival date & time 10/26/13  0830 History  This chart was scribed for Ephraim Hamburger, MD by Roxan Diesel, ED scribe.  This patient was seen in room [facility]05/[facility]05 and the patient's care was started at 8:58 AM.  Chief Complaint  Patient presents with  . Weakness    The history is provided by the patient. No language interpreter was used.   HPI Comments: [name] is a 79 y.o. female who presents to the Emergency Department complaining of several days of constant right sided flank pain radiating into her stomach.  Pt reports that three nights ago while lying in bed she became cold, "had the shakes" and couldn't get warm. She attempted to use a heating pad, blankets, and her space heater. The next morning she had improved, but slept all day and couldn't get up because she didn't get enough sleep the night before. She states her blood pressure was "fine" at that time. Pt states she didn't eat much from the onset of symptoms until Saturday.  She has also developed constant, waxing-and-waning right flank pain which at its peak was an 8/10 pain but has since improved. Pt also had an associated episode of vomiting three nights ago and two episodes of diarrhea yesterday. Pt denies cough., dysuria, hematuria, and chest pain. Pt has a h/o kidney stones including two operations. She notes that she went to her PCP for a check up and was put on blood pressure medicine 3 days ago.     Past Medical History  Diagnosis Date  . History of chest pain     MI ruled out  . Non-occlusive coronary artery disease 2003    by Catheterization  . History of hiatal hernia   . History of esophageal spasm   . Irritable bowel syndrome   . Breast cancer 2003  . Syncope, non cardiac   . Osteoarthritis   . Diverticulitis     History  . HTN (hypertension)   . Hypokalemia   . Kidney stones   . Osteoporosis 09/08/2011  . Hypothyroidism   . Dyslexia   . Kidney stone   . Infiltrating lobular  carcinoma 04/04/2013    Past Surgical History  Procedure Laterality Date  . Thyroidectomy      Hypothyroidism status post  . Cholecystectomy  1997    Status post -stones  . Laparoscopic nissen fundoplication  2536  . Arm fx      left  . Breast lumpectomy      Left  . Knee surgery    . Kidney stone surgery  1992  . Mandible reconstruction    . Cataract extraction    . Neck surgery  2000    [facility]  . Cataract extraction w/phaco  10/02/2011    Procedure: CATARACT EXTRACTION PHACO AND INTRAOCULAR LENS PLACEMENT (IOC);  Surgeon: [name], MD;  Location: [facility];  Service: Ophthalmology;  Laterality: Right;  CDE:17.01  . Kidney stent  06/04/11    removed after about 3 weeks.  . Lithotripsy  11/12    Family History  Problem Relation Age of Onset  . Anesthesia problems Neg Hx   . Hypotension Neg Hx   . Pseudochol deficiency Neg Hx   . Malignant hyperthermia Neg Hx     History  Substance Use Topics  . Smoking status: Never Smoker   . Smokeless tobacco: Never Used  . Alcohol Use: No     Comment: occasionally    OB History  Grav Para Term Preterm Abortions TAB SAB Ect Mult Living                 Review of Systems  Constitutional: Positive for chills.  Respiratory: Negative for cough.   Cardiovascular: Negative for chest pain.  Gastrointestinal: Positive for nausea, vomiting and diarrhea.  Genitourinary: Positive for flank pain. Negative for dysuria and hematuria.  Neurological: Positive for tremors.  All other systems reviewed and are negative.      Allergies  Metronidazole; Oxycodone; and Ciprofloxacin  Home Medications   Current Outpatient Rx  Name  Route  Sig  Dispense  Refill  . Cyanocobalamin (VITAMIN B-12) 2500 MCG SUBL   Sublingual   Place 1 tablet under the tongue daily.         Marland Kitchen esomeprazole (NEXIUM) 40 MG capsule   Oral   Take 40 mg by mouth daily.          Marland Kitchen levothyroxine (SYNTHROID, LEVOTHROID) 88 MCG tablet   Oral   Take 88 mcg by mouth  daily before breakfast.         . Multiple Vitamins-Minerals (CENTRUM SILVER ADULT 50+ PO)   Oral   Take 1 capsule by mouth daily.         Marland Kitchen olmesartan (BENICAR) 20 MG tablet   Oral   Take 20 mg by mouth daily.         . pravastatin (PRAVACHOL) 20 MG tablet   Oral   Take 20 mg by mouth daily.          Triage Vitals: BP 115/77  Pulse 130  Temp(Src) 97.9 F (36.6 C) (Oral)  Resp 21  Ht 5\' 6"  (1.676 m)  Wt 151 lb (68.493 kg)  BMI 24.38 kg/m2  SpO2 99%  Physical Exam  Nursing note and vitals reviewed. Constitutional: She is oriented to person, place, and time. She appears well-developed and well-nourished. No distress.  HENT:  Head: Normocephalic and atraumatic.  Eyes: EOM are normal.  Neck: Neck supple. No tracheal deviation present.  Cardiovascular: Regular rhythm.  Tachycardia present.   Pulmonary/Chest: Effort normal. No respiratory distress. She has no wheezes. She has no rales.  Abdominal: There is tenderness.  Right sided CVA tenderness. LUQ tenderness and mild LLQ tenderness.   Musculoskeletal: Normal range of motion.  Neurological: She is alert and oriented to person, place, and time.  Skin: Skin is warm and dry.  Psychiatric: She has a normal mood and affect. Her behavior is normal.    ED Course  Procedures (including critical care time)  DIAGNOSTIC STUDIES: Oxygen Saturation is 99% on room air, normal by my interpretation.    COORDINATION OF CARE: 9:06 AM Pt declines pain medications at this time. Discussed treatment plan, which includes EKG, CXR, and labs with pt at bedside and pt agreed to plan.    Labs Review Labs Reviewed  URINALYSIS, ROUTINE W REFLEX MICROSCOPIC - Abnormal; Notable for the following:    APPearance HAZY (*)    Hgb urine dipstick LARGE (*)    Protein, ur TRACE (*)    Leukocytes, UA MODERATE (*)    All other components within normal limits  CBC WITH DIFFERENTIAL - Abnormal; Notable for the following:    WBC 11.3 (*)     HCT 35.9 (*)    Neutrophils Relative % 80 (*)    Neutro Abs 9.0 (*)    Lymphocytes Relative 11 (*)    All other components within normal limits  BASIC METABOLIC PANEL -  Abnormal; Notable for the following:    Potassium 3.2 (*)    Glucose, Bld 108 (*)    BUN 37 (*)    Creatinine, Ser 1.25 (*)    GFR calc non Af Amer 41 (*)    GFR calc Af Amer 47 (*)    All other components within normal limits  HEPATIC FUNCTION PANEL - Abnormal; Notable for the following:    Albumin 3.2 (*)    All other components within normal limits  URINE MICROSCOPIC-ADD ON - Abnormal; Notable for the following:    Squamous Epithelial / LPF MANY (*)    Bacteria, UA MANY (*)    All other components within normal limits  LIPASE, BLOOD    Imaging Review Dg Chest 2 View  10/26/2013   CLINICAL DATA:  Weakness, breast cancer  EXAM: CHEST  2 VIEW  COMPARISON:  12/31/2012, 09/26/2013  FINDINGS: Stable postoperative changes of the left breast and axilla. Mild cardiomegaly. Mild hyperinflation without focal pneumonia or edema. No collapse, consolidation, effusion or pneumothorax. Trachea midline. Hiatal hernia noted. Degenerative changes of the spine.  IMPRESSION: Stable postoperative and chronic findings. No superimposed acute process   Electronically Signed   By: Daryll Brod M.D.   On: 10/26/2013 10:24   Ct Abdomen Pelvis W Contrast  10/26/2013   CLINICAL DATA:  Left-sided abdominal and pelvic pain. Right CVA tenderness. Breast carcinoma.  EXAM: CT ABDOMEN AND PELVIS WITH CONTRAST  TECHNIQUE: Multidetector CT imaging of the abdomen and pelvis was performed using the standard protocol following bolus administration of intravenous contrast.  CONTRAST:  145mL OMNIPAQUE IOHEXOL 300 MG/ML  SOLN  COMPARISON:  Noncontrast CT on 03/13/2013  FINDINGS: Several hepatic cyst remains stable. A 1 cm hypervascular lesion is seen in the lateral segment left hepatic lobe on image 19 which is indeterminate. Differential diagnosis includes  benign hypervascular lesion and metastasis.  Prior cholecystectomy noted. Prominence of extrahepatic common bile duct is stable. The pancreas, spleen, and adrenal glands are normal in appearance. Renal cysts are noted bilaterally, but there is no evidence of renal masses or hydronephrosis. Moderate size hiatal hernia again noted.  No other abdominal or pelvic soft tissue masses are seen. Uterus is unremarkable as well as adnexal regions. Colonic diverticulosis is again demonstrated. Mild wall thickening is seen involving the distal descending colon, suspicious for mild diverticulitis, although this has decreased since previous study. No evidence of abscess or free fluid. No evidence of bowel obstruction.  IMPRESSION: Mild diverticulitis involving the distal descending colon. No evidence of abscess or other complication.  1 cm indeterminate hypervascular lesion in the left hepatic lobe. Differential diagnosis includes benign hypervascular lesion and metastasis. Recommend further imaging characterization with abdomen MRI without and with contrast on nonemergent basis.   Electronically Signed   By: Earle Gell M.D.   On: 10/26/2013 11:53    EKG Interpretation    Date/Time:  Sunday October 26 2013 08:40:13 EST Ventricular Rate:  93 PR Interval:  182 QRS Duration: 86 QT Interval:  338 QTC Calculation: 420 R Axis:   -35 Text Interpretation:  Normal sinus rhythm Left axis deviation Possible Anterior infarct (cited on or before 31-Dec-2012) Abnormal ECG When compared with ECG of 31-Dec-2012 08:59, Premature ventricular complexes are no longer Present Confirmed by Lorrain Rivers  MD, Johari Pinney ([id number]) on 10/26/2013 9:04:52 AM            MDM   Final diagnoses:  Diverticulitis  Acute renal failure  Dehydration  Generalized weakness  Patient appears dehydrated, has AKI with essentially 0.5 increase in creatinine. Feels improved with fluids, but given her dehydration with diverticulitis (explains her left  sided abd pain) I feel she'll need IV abx and fluids. No UTI, kidney stone or other obvious explanation for her right sided mid back pain. No midline tenderness or lower leg weakness to suggest spinal cause. Dr. Karie Kirks will admit.   I personally performed the services described in this documentation, which was scribed in my presence. The recorded information has been reviewed and is accurate.    Ephraim Hamburger, MD 10/26/13 [phone]

## 2013-10-27 DIAGNOSIS — I10 Essential (primary) hypertension: Secondary | ICD-10-CM

## 2013-10-27 DIAGNOSIS — E039 Hypothyroidism, unspecified: Secondary | ICD-10-CM

## 2013-10-27 DIAGNOSIS — E860 Dehydration: Secondary | ICD-10-CM | POA: Diagnosis not present

## 2013-10-27 DIAGNOSIS — K219 Gastro-esophageal reflux disease without esophagitis: Secondary | ICD-10-CM

## 2013-10-27 DIAGNOSIS — K5732 Diverticulitis of large intestine without perforation or abscess without bleeding: Secondary | ICD-10-CM | POA: Diagnosis not present

## 2013-10-27 LAB — HEMOGLOBIN AND HEMATOCRIT, BLOOD
HCT: 31 % — ABNORMAL LOW (ref 36.0–46.0)
HEMOGLOBIN: 10.2 g/dL — AB (ref 12.0–15.0)

## 2013-10-27 LAB — BASIC METABOLIC PANEL
BUN: 18 mg/dL (ref 6–23)
CO2: 22 meq/L (ref 19–32)
CREATININE: 0.81 mg/dL (ref 0.50–1.10)
Calcium: 8.5 mg/dL (ref 8.4–10.5)
Chloride: 111 mEq/L (ref 96–112)
GFR calc Af Amer: 80 mL/min — ABNORMAL LOW (ref >90)
GFR, EST NON AFRICAN AMERICAN: 69 mL/min — AB (ref >90)
GLUCOSE: 81 mg/dL (ref 70–99)
Potassium: 4 mEq/L (ref 3.7–5.3)
Sodium: 142 mEq/L (ref 137–147)

## 2013-10-27 LAB — CBC
HCT: 28.9 % — ABNORMAL LOW (ref 36.0–46.0)
Hemoglobin: 9.6 g/dL — ABNORMAL LOW (ref 12.0–15.0)
MCH: 28.7 pg (ref 26.0–34.0)
MCHC: 33.2 g/dL (ref 30.0–36.0)
MCV: 86.3 fL (ref 78.0–100.0)
PLATELETS: 157 10*3/uL (ref 150–400)
RBC: 3.35 MIL/uL — ABNORMAL LOW (ref 3.87–5.11)
RDW: 13.9 % (ref 11.5–15.5)
WBC: 7.4 10*3/uL (ref 4.0–10.5)

## 2013-10-27 MED ORDER — AMOXICILLIN-POT CLAVULANATE 875-125 MG PO TABS: 1.0000 | ORAL_TABLET | Freq: Two times a day (BID) | ORAL | Status: DC

## 2013-10-27 NOTE — Discharge Instructions (Signed)
Diverticulitis °A diverticulum is a small pouch or sac on the colon. Diverticulosis is the presence of these diverticula on the colon. Diverticulitis is the irritation (inflammation) or infection of diverticula. °CAUSES  °The colon and its diverticula contain bacteria. If food particles block the tiny opening to a diverticulum, the bacteria inside can grow and cause an increase in pressure. This leads to infection and inflammation and is called diverticulitis. °SYMPTOMS  °· Abdominal pain and tenderness. Usually, the pain is located on the left side of your abdomen. However, it could be located elsewhere. °· Fever. °· Bloating. °· Feeling sick to your stomach (nausea). °· Throwing up (vomiting). °· Abnormal stools. °DIAGNOSIS  °Your caregiver will take a history and perform a physical exam. Since many things can cause abdominal pain, other tests may be necessary. Tests may include: °· Blood tests. °· Urine tests. °· X-ray of the abdomen. °· CT scan of the abdomen. °Sometimes, surgery is needed to determine if diverticulitis or other conditions are causing your symptoms. °TREATMENT  °Most of the time, you can be treated without surgery. Treatment includes: °· Resting the bowels by only having liquids for a few days. As you improve, you will need to eat a low-fiber diet. °· Intravenous (IV) fluids if you are losing body fluids (dehydrated). °· Antibiotic medicines that treat infections may be given. °· Pain and nausea medicine, if needed. °· Surgery if the inflamed diverticulum has burst. °HOME CARE INSTRUCTIONS  °· Try a clear liquid diet (broth, tea, or water for as long as directed by your caregiver). You may then gradually begin a low-fiber diet as tolerated.  °A low-fiber diet is a diet with less than 10 grams of fiber. Choose the foods below to reduce fiber in the diet: °· White breads, cereals, rice, and pasta. °· Cooked fruits and vegetables or soft fresh fruits and vegetables without the skin. °· Ground or  well-cooked tender beef, ham, veal, lamb, pork, or poultry. °· Eggs and seafood. °· After your diverticulitis symptoms have improved, your caregiver may put you on a high-fiber diet. A high-fiber diet includes 14 grams of fiber for every 1000 calories consumed. For a standard 2000 calorie diet, you would need 28 grams of fiber. Follow these diet guidelines to help you increase the fiber in your diet. It is important to slowly increase the amount fiber in your diet to avoid gas, constipation, and bloating. °· Choose whole-grain breads, cereals, pasta, and brown rice. °· Choose fresh fruits and vegetables with the skin on. Do not overcook vegetables because the more vegetables are cooked, the more fiber is lost. °· Choose more nuts, seeds, legumes, dried peas, beans, and lentils. °· Look for food products that have greater than 3 grams of fiber per serving on the Nutrition Facts label. °· Take all medicine as directed by your caregiver. °· If your caregiver has given you a follow-up appointment, it is very important that you go. Not going could result in lasting (chronic) or permanent injury, pain, and disability. If there is any problem keeping the appointment, call to reschedule. °SEEK MEDICAL CARE IF:  °· Your pain does not improve. °· You have a hard time advancing your diet beyond clear liquids. °· Your bowel movements do not return to normal. °SEEK IMMEDIATE MEDICAL CARE IF:  °· Your pain becomes worse. °· You have an oral temperature above 102° F (38.9° C), not controlled by medicine. °· You have repeated vomiting. °· You have bloody or black, tarry stools. °·   Symptoms that brought you to your caregiver become worse or are not getting better. °MAKE SURE YOU:  °· Understand these instructions. °· Will watch your condition. °· Will get help right away if you are not doing well or get worse. °Document Released: 05/31/2005 Document Revised: 11/13/2011 Document Reviewed: 09/26/2010 °[employer]® Patient Information  ©2014 [employer]. ° °

## 2013-10-27 NOTE — Progress Notes (Signed)
Utilization review Completed Caedyn Raygoza RN BSN   

## 2013-10-27 NOTE — Discharge Summary (Signed)
Physician Discharge Summary  [name] [id number] DOB: 1934/09/23 DOA: 10/26/2013  PCP: [name], MD  Admit date: 10/26/2013 Discharge date: 10/27/2013  Time spent: 35 minutes  Recommendations for Outpatient Follow-up:  1. Follow up with primary care doctor in 2 weeks 2. Outpatient MRI abdomen with and without contrast for liver lesion  Discharge Diagnoses:  Principal Problem:   Diverticulitis Active Problems:   Dehydration   Acute renal failure   Generalized weakness   Acute diverticulitis Anemia, chronic Liver lesion, for outpatient work up Hypothyroidism GERD HTN  Discharge Condition: improved  Diet recommendation: low salt  Filed Weights   10/26/13 0849  Weight: 68.493 kg (151 lb)    History of present illness:  79 year old woman with a 4 day history of chills, generalized weakness, nausea and vomiting presented to the emergency department where initial evaluation suggested acute renal failure, dehydration and diverticulitis without complicating features.  Patient reports she became ill after going out to eat 2/19. She had an episode of vomiting and shaking chills all that evening. The following day she spent the entire day in bed and did not eat or drink. Yesterday she had diarrhea. Today she felt so generally weak that she could not walk and so she came to the emergency department. She has had some abdominal pain mild. No specific aggravating or alleviating factors.  In the emergency department afebrile, vital signs stable. No hypoxia.treated with Unasyn. Basic metabolic panel revealed potassium 3.2, elevated BUN and creatinine above baseline 37/1.25. Hepatic function panel unremarkable. Lipase normal. CBC with modest elevation of WBC otherwise unremarkable. Chest x-ray unremarkable. CT abdomen and pelvis revealed mild diverticulitis. No abscess or complicating features.    Hospital Course:  This patient was admitted to the hospital with nausea vomiting,  dehydration. She is on mild diverticulitis in the distal descending colon. She was started on intravenous antibiotics. Her diet was advanced to solid food and she is tolerating this without any difficulty. The patient feels significantly improved. She's no longer having nausea or vomiting. She's not had a bowel movement since admitting to the hospital. Abdominal pain has significantly improved and abdominal exam is benign. She'll be placed on course of oral antibiotics. An incidental finding of a 1 cm indeterminate hypervascular lesion was found the left hepatic lobe. She will need outpatient MRI for further characterization. The patient is very anxious to discharge home and feels improved.  Procedures:    Consultations:    Discharge Exam: Filed Vitals:   10/27/13 [id number]  BP: 127/45  Pulse: 60  Temp: 98.5 F (36.9 C)  Resp: 20    General: NAD Cardiovascular: S1, S2 RRR Respiratory: CTA B  Discharge Instructions  Discharge Orders   Future Appointments Provider Department Dept Phone   11/10/2013 9:30 AM [facility] [phone]   NPO after midnight   11/10/2013 11:00 AM [facility] [phone]   NPO after midnight   11/10/2013 1:00 PM [facility] 2 [facility] [phone]   NPO after midnight   04/06/2014 11:00 AM [facility] 1 [place] [phone]   Please arrive 15 minutes prior to your appointment time. Any medications can be taken as usual.   04/10/2014 10:30 AM [facility] Covering Provider [place] [phone]   09/17/2014 10:45 AM [facility] Team A [place] [phone]   Future Orders Complete By Expires   Call MD for:  persistant nausea and vomiting  As directed    Call MD for:  severe uncontrolled pain  As directed    Call MD for:  temperature >100.4  As directed    Diet - low sodium heart healthy  As  directed    Increase activity slowly  As directed        Medication List         amoxicillin-clavulanate 875-125 MG per tablet  Commonly known as:  AUGMENTIN  Take 1 tablet by mouth 2 (two) times daily.     CENTRUM SILVER ADULT 50+ PO  Take 1 capsule by mouth daily.     esomeprazole 40 MG capsule  Commonly known as:  NEXIUM  Take 40 mg by mouth daily.     levothyroxine 88 MCG tablet  Commonly known as:  SYNTHROID, LEVOTHROID  Take 88 mcg by mouth daily before breakfast.     olmesartan 20 MG tablet  Commonly known as:  BENICAR  Take 20 mg by mouth daily.     pravastatin 20 MG tablet  Commonly known as:  PRAVACHOL  Take 20 mg by mouth daily.     Vitamin B-12 2500 MCG Subl  Place 1 tablet under the tongue daily.       Allergies  Allergen Reactions  . Metronidazole Other (See Comments)    No taste in mouth and lost 32 lbs  . Oxycodone Other (See Comments)     delusions  . Ciprofloxacin Rash       Follow-up Information   Follow up with [name], MD. Schedule an appointment as soon as possible for a visit in 2 weeks.   Specialty:  Internal Medicine   Contact informationBartholomew Boards [phone]        The results of significant diagnostics from this hospitalization (including imaging, microbiology, ancillary and laboratory) are listed below for reference.    Significant Diagnostic Studies: Dg Chest 2 View  10/26/2013   CLINICAL DATA:  Weakness, breast cancer  EXAM: CHEST  2 VIEW  COMPARISON:  12/31/2012, 09/26/2013  FINDINGS: Stable postoperative changes of the left breast and axilla. Mild cardiomegaly. Mild hyperinflation without focal pneumonia or edema. No collapse, consolidation, effusion or pneumothorax. Trachea midline. Hiatal hernia noted. Degenerative changes of the spine.  IMPRESSION: Stable postoperative and chronic findings. No superimposed acute process   Electronically Signed   By: Daryll Brod M.D.   On: 10/26/2013 10:24   Ct Abdomen Pelvis W  Contrast  10/26/2013   CLINICAL DATA:  Left-sided abdominal and pelvic pain. Right CVA tenderness. Breast carcinoma.  EXAM: CT ABDOMEN AND PELVIS WITH CONTRAST  TECHNIQUE: Multidetector CT imaging of the abdomen and pelvis was performed using the standard protocol following bolus administration of intravenous contrast.  CONTRAST:  181mL OMNIPAQUE IOHEXOL 300 MG/ML  SOLN  COMPARISON:  Noncontrast CT on 03/13/2013  FINDINGS: Several hepatic cyst remains stable. A 1 cm hypervascular lesion is seen in the lateral segment left hepatic lobe on image 19 which is indeterminate. Differential diagnosis includes benign hypervascular lesion and metastasis.  Prior cholecystectomy noted. Prominence of extrahepatic common bile duct is stable. The pancreas, spleen, and adrenal glands are normal in appearance. Renal cysts are noted bilaterally, but there is no evidence of renal masses or hydronephrosis. Moderate size hiatal hernia again noted.  No other abdominal or pelvic soft tissue masses are seen. Uterus is unremarkable as well as adnexal regions. Colonic diverticulosis is again demonstrated. Mild wall thickening is seen involving the distal descending colon, suspicious for mild  diverticulitis, although this has decreased since previous study. No evidence of abscess or free fluid. No evidence of bowel obstruction.  IMPRESSION: Mild diverticulitis involving the distal descending colon. No evidence of abscess or other complication.  1 cm indeterminate hypervascular lesion in the left hepatic lobe. Differential diagnosis includes benign hypervascular lesion and metastasis. Recommend further imaging characterization with abdomen MRI without and with contrast on nonemergent basis.   Electronically Signed   By: Earle Gell M.D.   On: 10/26/2013 11:53    Microbiology: No results found for this or any previous visit (from the past 240 hour(s)).   Labs: Basic Metabolic Panel:  Recent Labs Lab 10/26/13 0846 10/27/13 0452  NA  137 142  K 3.2* 4.0  CL 99 111  CO2 24 22  GLUCOSE 108* 81  BUN 37* 18  CREATININE 1.25* 0.81  CALCIUM 10.1 8.5   Liver Function Tests:  Recent Labs Lab 10/26/13 0848  AST 26  ALT 22  ALKPHOS 86  BILITOT 0.3  PROT 7.3  ALBUMIN 3.2*    Recent Labs Lab 10/26/13 0848  LIPASE 18   No results found for this basename: AMMONIA,  in the last 168 hours CBC:  Recent Labs Lab 10/26/13 0846 10/27/13 0452 10/27/13 0917  WBC 11.3* 7.4  --   NEUTROABS 9.0*  --   --   HGB 12.0 9.6* 10.2*  HCT 35.9* 28.9* 31.0*  MCV 85.9 86.3  --   PLT 150 157  --    Cardiac Enzymes: No results found for this basename: CKTOTAL, CKMB, CKMBINDEX, TROPONINI,  in the last 168 hours BNP: BNP (last 3 results) No results found for this basename: PROBNP,  in the last 8760 hours CBG: No results found for this basename: GLUCAP,  in the last 168 hours     Signed:  MEMON,JEHANZEB  [facility] 10/27/2013, 10:53 AM

## 2013-10-30 DIAGNOSIS — R221 Localized swelling, mass and lump, neck: Secondary | ICD-10-CM | POA: Insufficient documentation

## 2013-11-07 ENCOUNTER — Encounter (HOSPITAL_COMMUNITY): Payer: Self-pay

## 2013-11-07 ENCOUNTER — Encounter (HOSPITAL_COMMUNITY): Payer: Medicare Other | Attending: Oncology

## 2013-11-07 VITALS — BP 125/71 | HR 91 | Temp 98.5°F | Resp 18 | Wt 154.3 lb

## 2013-11-07 DIAGNOSIS — M810 Age-related osteoporosis without current pathological fracture: Secondary | ICD-10-CM | POA: Diagnosis not present

## 2013-11-07 DIAGNOSIS — E349 Endocrine disorder, unspecified: Secondary | ICD-10-CM | POA: Insufficient documentation

## 2013-11-07 DIAGNOSIS — C50912 Malignant neoplasm of unspecified site of left female breast: Secondary | ICD-10-CM

## 2013-11-07 DIAGNOSIS — E039 Hypothyroidism, unspecified: Secondary | ICD-10-CM | POA: Diagnosis not present

## 2013-11-07 DIAGNOSIS — R221 Localized swelling, mass and lump, neck: Secondary | ICD-10-CM

## 2013-11-07 DIAGNOSIS — E0789 Other specified disorders of thyroid: Secondary | ICD-10-CM | POA: Diagnosis not present

## 2013-11-07 DIAGNOSIS — Z853 Personal history of malignant neoplasm of breast: Secondary | ICD-10-CM | POA: Diagnosis not present

## 2013-11-07 DIAGNOSIS — R220 Localized swelling, mass and lump, head: Secondary | ICD-10-CM | POA: Insufficient documentation

## 2013-11-07 NOTE — Progress Notes (Signed)
[place]  OFFICE PROGRESS NOTE  [place] [facility] [place], [place] [address]  DIAGNOSIS: Neck mass  Increased PTH level  Hypercalcemia  Breast cancer, left  Chief Complaint  Patient presents with  . Breast Cancer  . Elevated PTH, elevated calcium    CURRENT THERAPY: Zoledronic acid yearly for osteoporosis, last dose in January 2014 receiving 4 mg. She was hospitalized in late February 2015 with the distal descending colon diverticulitis. CT scan the chest done on 09/26/2013 showed a left upper lobe consolidation suggestive of a resolving inflammatory process.  INTERVAL HISTORY: [name] 79 y.o. female returns for followup of recently appreciated right neck mass in conjunction with elevated PTH level and hypercalcemia. Patient was also found to have bilateral kidney stones on recent consultation for diverticulitis. She denies any flank pain at this time. She also denies any dysphagia, fever, night sweats, but does feel tired a lot and is constipated. She denies any lower extremity swelling or redness, peripheral paresthesias, cough, wheezing, PND, orthopnea, palpitations, headache, or seizures. She has undergone Nissen plication in the past as well as to previous neck surgeries for parathyroid adenoma is. These occurred in the 1980s and again another about a year and a half later both done in [place]. She's also had kidney stones resected with the basket 1992 and most recently destroyed via lithotripsy.   MEDICAL HISTORY: Past Medical History  Diagnosis Date  . History of chest pain     MI ruled out  . Non-occlusive coronary artery disease 2003    by Catheterization  . History of hiatal hernia   . History of esophageal spasm   . Irritable bowel syndrome   . Breast cancer 2003  . Syncope, non cardiac   . Osteoarthritis   . Diverticulitis     History  . HTN (hypertension)   . Hypokalemia   . Kidney stones   .  Osteoporosis 09/08/2011  . Hypothyroidism   . Dyslexia   . Kidney stone   . Infiltrating lobular carcinoma 04/04/2013    INTERIM HISTORY: has FULL INCONTINENCE OF FECES; DIARRHEA; Osteoporosis; Syncope and collapse; Chest pain; Dehydration; Acute renal failure; HTN (hypertension); Hypothyroid; GERD (gastroesophageal reflux disease); Infiltrating lobular carcinoma, left breast; Diverticulitis; Generalized weakness; Acute diverticulitis; and Neck mass on her problem list.   Stage II A. (T2, N0, M0) infiltrating lobular carcinoma left breast diagnosed in 2003 status post partial mastectomy on 02/25/2002 with a 3.0 x 3.5 cm primary ER +99% PR +80% HER-2/neu nonamplified and Ki-67 marker was low at 10% surgery with negative sentinel nodes felt to be well-differentiated status post radiation therapy followed by Arimidex initially, which she could not tolerate, followed by letrozole and she completed 5 full years of adjuvant hormonal therapy.  ALLERGIES:  is allergic to metronidazole; oxycodone; and ciprofloxacin.  MEDICATIONS: has a current medication list which includes the following prescription(s): vitamin b-12, esomeprazole, levothyroxine, multiple vitamins-minerals, olmesartan, potassium chloride, pravastatin, and amoxicillin-clavulanate.  SURGICAL HISTORY:  Past Surgical History  Procedure Laterality Date  . Thyroidectomy      Hypothyroidism status post  . Cholecystectomy  1997    Status post -stones  . Laparoscopic nissen fundoplication  8721  . Arm fx      left  . Breast lumpectomy      Left  . Knee surgery      bilateral  . Kidney stone surgery  1992  . Mandible reconstruction    .  Cataract extraction    . Neck surgery  2000    [facility]  . Cataract extraction w/phaco  10/02/2011    Procedure: CATARACT EXTRACTION PHACO AND INTRAOCULAR LENS PLACEMENT (IOC);  Surgeon: [name], MD;  Location: [facility];  Service: Ophthalmology;  Laterality: Right;  CDE:17.01  . Kidney stent  06/04/11     removed after about 3 weeks.  . Lithotripsy  11/12  . Tubal ligation      FAMILY HISTORY: family history includes Macular degeneration in her sister. There is no history of Anesthesia problems, Hypotension, Pseudochol deficiency, or Malignant hyperthermia.  SOCIAL HISTORY:  reports that she has never smoked. She has never used smokeless tobacco. She reports that she does not drink alcohol or use illicit drugs.  REVIEW OF SYSTEMS:  Other than that discussed above is noncontributory.  PHYSICAL EXAMINATION: ECOG PERFORMANCE STATUS: 1 - Symptomatic but completely ambulatory  There were no vitals taken for this visit.  GENERAL:alert, no distress and comfortable SKIN: skin color, texture, turgor are normal, no rashes or significant lesions EYES: PERLA; Conjunctiva are pink and non-injected, sclera clear OROPHARYNX:no exudate, no erythema on lips, buccal mucosa, or tongue. NECK: supple, thyroid normal size, non-tender, without nodularity. Neck less surgical scar well healed. No neck mass appreciated per se. CHEST: No masses in either breast. Slightly increased AP diameter. LYMPH:  no palpable lymphadenopathy in the cervical, axillary or inguinal LUNGS: clear to auscultation and percussion with normal breathing effort HEART: regular rate & rhythm and no murmurs. ABDOMEN:abdomen soft, non-tender and normal bowel sounds MUSCULOSKELETAL:no cyanosis of digits and no clubbing. Range of motion normal. No lymphedema. NEURO: alert & oriented x 3 with fluent speech, no focal motor/sensory deficits. Normal reflexes bilaterally.   LABORATORY DATA: Admission on 10/26/2013, Discharged on 10/27/2013  Component Date Value Ref Range Status  . Color, Urine 10/26/2013 YELLOW  YELLOW Final  . APPearance 10/26/2013 HAZY* CLEAR Final  . Specific Gravity, Urine 10/26/2013 1.015  1.005 - 1.030 Final  . pH 10/26/2013 5.5  5.0 - 8.0 Final  . Glucose, UA 10/26/2013 NEGATIVE  NEGATIVE mg/dL Final  . Hgb urine  dipstick 10/26/2013 LARGE* NEGATIVE Final  . Bilirubin Urine 10/26/2013 NEGATIVE  NEGATIVE Final  . Ketones, ur 10/26/2013 NEGATIVE  NEGATIVE mg/dL Final  . Protein, ur 10/26/2013 TRACE* NEGATIVE mg/dL Final  . Urobilinogen, UA 10/26/2013 0.2  0.0 - 1.0 mg/dL Final  . Nitrite 10/26/2013 NEGATIVE  NEGATIVE Final  . Leukocytes, UA 10/26/2013 MODERATE* NEGATIVE Final  . WBC 10/26/2013 11.3* 4.0 - 10.5 K/uL Final  . RBC 10/26/2013 4.18  3.87 - 5.11 MIL/uL Final  . Hemoglobin 10/26/2013 12.0  12.0 - 15.0 g/dL Final  . HCT 10/26/2013 35.9* 36.0 - 46.0 % Final  . MCV 10/26/2013 85.9  78.0 - 100.0 fL Final  . MCH 10/26/2013 28.7  26.0 - 34.0 pg Final  . MCHC 10/26/2013 33.4  30.0 - 36.0 g/dL Final  . RDW 10/26/2013 14.0  11.5 - 15.5 % Final  . Platelets 10/26/2013 150  150 - 400 K/uL Final  . Neutrophils Relative % 10/26/2013 80* 43 - 77 % Final  . Neutro Abs 10/26/2013 9.0* 1.7 - 7.7 K/uL Final  . Lymphocytes Relative 10/26/2013 11* 12 - 46 % Final  . Lymphs Abs 10/26/2013 1.3  0.7 - 4.0 K/uL Final  . Monocytes Relative 10/26/2013 7  3 - 12 % Final  . Monocytes Absolute 10/26/2013 0.8  0.1 - 1.0 K/uL Final  . Eosinophils Relative 10/26/2013 1  0 - 5 % Final  . Eosinophils Absolute 10/26/2013 0.2  0.0 - 0.7 K/uL Final  . Basophils Relative 10/26/2013 0  0 - 1 % Final  . Basophils Absolute 10/26/2013 0.0  0.0 - 0.1 K/uL Final  . Sodium 10/26/2013 137  137 - 147 mEq/L Final  . Potassium 10/26/2013 3.2* 3.7 - 5.3 mEq/L Final  . Chloride 10/26/2013 99  96 - 112 mEq/L Final  . CO2 10/26/2013 24  19 - 32 mEq/L Final  . Glucose, Bld 10/26/2013 108* 70 - 99 mg/dL Final  . BUN 10/26/2013 37* 6 - 23 mg/dL Final  . Creatinine, Ser 10/26/2013 1.25* 0.50 - 1.10 mg/dL Final  . Calcium 10/26/2013 10.1  8.4 - 10.5 mg/dL Final  . GFR calc non Af Amer 10/26/2013 41* >90 mL/min Final  . GFR calc Af Amer 10/26/2013 47* >90 mL/min Final   Comment: (NOTE)                          The eGFR has been calculated  using the CKD EPI equation.                          This calculation has not been validated in all clinical situations.                          eGFR's persistently <90 mL/min signify possible Chronic Kidney                          Disease.  . Total Protein 10/26/2013 7.3  6.0 - 8.3 g/dL Final  . Albumin 10/26/2013 3.2* 3.5 - 5.2 g/dL Final  . AST 10/26/2013 26  0 - 37 U/L Final  . ALT 10/26/2013 22  0 - 35 U/L Final  . Alkaline Phosphatase 10/26/2013 86  39 - 117 U/L Final  . Total Bilirubin 10/26/2013 0.3  0.3 - 1.2 mg/dL Final  . Bilirubin, Direct 10/26/2013 <0.2  0.0 - 0.3 mg/dL Final  . Indirect Bilirubin 10/26/2013 NOT CALCULATED  0.3 - 0.9 mg/dL Final  . Lipase 10/26/2013 18  11 - 59 U/L Final  . Squamous Epithelial / LPF 10/26/2013 MANY* RARE Final  . WBC, UA 10/26/2013 7-10  <3 WBC/hpf Final  . RBC / HPF 10/26/2013 3-6  <3 RBC/hpf Final  . Bacteria, UA 10/26/2013 MANY* RARE Final  . Urine-Other 10/26/2013 RARE YEAST   Final  . Sodium 10/27/2013 142  137 - 147 mEq/L Final  . Potassium 10/27/2013 4.0  3.7 - 5.3 mEq/L Final   DELTA CHECK NOTED  . Chloride 10/27/2013 111  96 - 112 mEq/L Final  . CO2 10/27/2013 22  19 - 32 mEq/L Final  . Glucose, Bld 10/27/2013 81  70 - 99 mg/dL Final  . BUN 10/27/2013 18  6 - 23 mg/dL Final  . Creatinine, Ser 10/27/2013 0.81  0.50 - 1.10 mg/dL Final  . Calcium 10/27/2013 8.5  8.4 - 10.5 mg/dL Final  . GFR calc non Af Amer 10/27/2013 69* >90 mL/min Final  . GFR calc Af Amer 10/27/2013 80* >90 mL/min Final   Comment: (NOTE)                          The eGFR has been calculated using the CKD EPI equation.  This calculation has not been validated in all clinical situations.                          eGFR's persistently <90 mL/min signify possible Chronic Kidney                          Disease.  . WBC 10/27/2013 7.4  4.0 - 10.5 K/uL Final  . RBC 10/27/2013 3.35* 3.87 - 5.11 MIL/uL Final  . Hemoglobin 10/27/2013 9.6* 12.0  - 15.0 g/dL Final   Comment: DELTA CHECK NOTED                          RESULT REPEATED AND VERIFIED  . HCT 10/27/2013 28.9* 36.0 - 46.0 % Final  . MCV 10/27/2013 86.3  78.0 - 100.0 fL Final  . MCH 10/27/2013 28.7  26.0 - 34.0 pg Final  . MCHC 10/27/2013 33.2  30.0 - 36.0 g/dL Final  . RDW 10/27/2013 13.9  11.5 - 15.5 % Final  . Platelets 10/27/2013 157  150 - 400 K/uL Final  . Hemoglobin 10/27/2013 10.2* 12.0 - 15.0 g/dL Final  . HCT 10/27/2013 31.0* 36.0 - 46.0 % Final    PATHOLOGY: No new pathology.  Urinalysis    Component Value Date/Time   COLORURINE YELLOW 10/26/2013 0840   APPEARANCEUR HAZY* 10/26/2013 0840   LABSPEC 1.015 10/26/2013 0840   PHURINE 5.5 10/26/2013 0840   GLUCOSEU NEGATIVE 10/26/2013 0840   HGBUR LARGE* 10/26/2013 0840   BILIRUBINUR NEGATIVE 10/26/2013 0840   KETONESUR NEGATIVE 10/26/2013 0840   PROTEINUR TRACE* 10/26/2013 0840   UROBILINOGEN 0.2 10/26/2013 0840   NITRITE NEGATIVE 10/26/2013 0840   LEUKOCYTESUR MODERATE* 10/26/2013 0840    RADIOGRAPHIC STUDIES:   US Soft Tissue Head/Neck Status: Final result         PACS Images    Show images for US Soft Tissue Head/Neck         Study Result    CLINICAL DATA: Elevated serum calcium and status post prior  thyroidectomy.  EXAM:  THYROID ULTRASOUND  TECHNIQUE:  Ultrasound examination of the thyroid gland and adjacent soft  tissues was performed.  COMPARISON: None.  FINDINGS:  No normal appearing thyroid tissue is identified. Heterogeneous soft  tissue in the right neck in the expected region of the thyroid gland  is identified which shows increased vascularity in measures  approximately 2.4 x 1.3 x 0.8 cm. This may represent remnant thyroid  tissue or potentially enlarged parathyroid tissue given the clinical  history of elevated calcium.  Lymphadenopathy  None visualized.  IMPRESSION:  Heterogeneous soft tissue in the right neck in the expected region  of the thyroid gland. As above, this  may represent remnant thyroid  tissue or potentially abnormal parathyroid tissue. Correlation is  suggested with parathormone and calcium levels. Correlation is also  suggested with surgical history. It may help to obtain a nuclear  medicine parathyroid scan if there is suspicion of a parathyroid  adenoma clinically.  Electronically Signed  By: Aletta Edouard M.D.  On: 09/26/2013 13:      Dg Chest 2 View  10/26/2013   CLINICAL DATA:  Weakness, breast cancer  EXAM: CHEST  2 VIEW  COMPARISON:  12/31/2012, 09/26/2013  FINDINGS: Stable postoperative changes of the left breast and axilla. Mild cardiomegaly. Mild hyperinflation without focal pneumonia or edema. No collapse, consolidation, effusion or pneumothorax. Trachea midline.  Hiatal hernia noted. Degenerative changes of the spine.  IMPRESSION: Stable postoperative and chronic findings. No superimposed acute process   Electronically Signed   By: Daryll Brod M.D.   On: 10/26/2013 10:24   Ct Abdomen Pelvis W Contrast  10/26/2013   CLINICAL DATA:  Left-sided abdominal and pelvic pain. Right CVA tenderness. Breast carcinoma.  EXAM: CT ABDOMEN AND PELVIS WITH CONTRAST  TECHNIQUE: Multidetector CT imaging of the abdomen and pelvis was performed using the standard protocol following bolus administration of intravenous contrast.  CONTRAST:  195m OMNIPAQUE IOHEXOL 300 MG/ML  SOLN  COMPARISON:  Noncontrast CT on 03/13/2013  FINDINGS: Several hepatic cyst remains stable. A 1 cm hypervascular lesion is seen in the lateral segment left hepatic lobe on image 19 which is indeterminate. Differential diagnosis includes benign hypervascular lesion and metastasis.  Prior cholecystectomy noted. Prominence of extrahepatic common bile duct is stable. The pancreas, spleen, and adrenal glands are normal in appearance. Renal cysts are noted bilaterally, but there is no evidence of renal masses or hydronephrosis. Moderate size hiatal hernia again noted.  No other abdominal  or pelvic soft tissue masses are seen. Uterus is unremarkable as well as adnexal regions. Colonic diverticulosis is again demonstrated. Mild wall thickening is seen involving the distal descending colon, suspicious for mild diverticulitis, although this has decreased since previous study. No evidence of abscess or free fluid. No evidence of bowel obstruction.  IMPRESSION: Mild diverticulitis involving the distal descending colon. No evidence of abscess or other complication.  1 cm indeterminate hypervascular lesion in the left hepatic lobe. Differential diagnosis includes benign hypervascular lesion and metastasis. Recommend further imaging characterization with abdomen MRI without and with contrast on nonemergent basis.   Electronically Signed   By: JEarle GellM.D.   On: 10/26/2013 11:53    ASSESSMENT:  #1. Probable recurrent parathyroid adenoma with elevated PTH, elevated calcium, and kidney stones.. #2.Stage II A. (T2, N0, M0) infiltrating lobular carcinoma left breast diagnosed in 2003 status post partial mastectomy on 02/25/2002 with a 3.0 x 3.5 cm primary ER +99% PR +80% HER-2/neu nonamplified and Ki-67 marker was low at 10% surgery with negative sentinel nodes felt to be well-differentiated status post radiation therapy followed by Arimidex initially which she could not tolerate followed by letrozole and she completed 5 full years of adjuvant hormonal therapy. #3. Osteoporosis, on yearly intravenous bisphosphonate therapy. #4. Left lingular lung nodule, coalesced, probably inflammatory. #5. Hypothyroidism, on treatment.    PLAN:  #1. Referral to otolaryngology for neck exploration. #2. Followup in 6 weeks with CBC, chem profile, PTH level.   All questions were answered. The patient knows to call the clinic with any problems, questions or concerns. We can certainly see the patient much sooner if necessary.   I spent 30 minutes counseling the patient face to face. The total time spent in  the appointment was 40 minutes.    FDoroteo Bradford MD 11/07/2013 2:31 PM

## 2013-11-07 NOTE — Patient Instructions (Addendum)
[place] Discharge Instructions  RECOMMENDATIONS MADE BY THE CONSULTANT AND ANY TEST RESULTS WILL BE SENT TO YOUR REFERRING PHYSICIAN.  EXAM FINDINGS BY THE PHYSICIAN TODAY AND SIGNS OR SYMPTOMS TO REPORT TO CLINIC OR PRIMARY PHYSICIAN: Exam and findings as discussed by Dr. Barnet Glasgow.  We will get you an appointment to see Dr. Benjamine Mola regarding your elevated PTH level.  We will cancel the scans that are scheduled for next week.  MEDICATIONS PRESCRIBED:  none  INSTRUCTIONS/FOLLOW-UP: Dr. Benjamine Mola 11/10/13 at 3:30pm [address], [place]  We will see you back in 6 weeks.  Thank you for choosing [facility] to provide your oncology and hematology care.  To afford each patient quality time with our providers, please arrive at least 15 minutes before your scheduled appointment time.  With your help, our goal is to use those 15 minutes to complete the necessary work-up to ensure our physicians have the information they need to help with your evaluation and healthcare recommendations.    Effective January 1st, 2014, we ask that you re-schedule your appointment with our physicians should you arrive 10 or more minutes late for your appointment.  We strive to give you quality time with our providers, and arriving late affects you and other patients whose appointments are after yours.    Again, thank you for choosing [facility]  Our hope is that these requests will decrease the amount of time that you wait before being seen by our physicians.       _____________________________________________________________  Should you have questions after your visit to [facility], please contact our office at [phone] between the hours of 8:30 a.m. and 5:00 p.m.  Voicemails left after 4:30 p.m. will not be returned until the following business day.  For prescription refill requests, have your pharmacy contact our office with your  prescription refill request.

## 2013-11-10 ENCOUNTER — Encounter (HOSPITAL_COMMUNITY): Payer: Medicare Other

## 2013-11-10 ENCOUNTER — Other Ambulatory Visit (INDEPENDENT_AMBULATORY_CARE_PROVIDER_SITE_OTHER): Payer: Self-pay | Admitting: Otolaryngology

## 2013-11-10 DIAGNOSIS — D449 Neoplasm of uncertain behavior of unspecified endocrine gland: Secondary | ICD-10-CM | POA: Diagnosis not present

## 2013-11-10 DIAGNOSIS — D351 Benign neoplasm of parathyroid gland: Secondary | ICD-10-CM

## 2013-11-11 ENCOUNTER — Other Ambulatory Visit (INDEPENDENT_AMBULATORY_CARE_PROVIDER_SITE_OTHER): Payer: Self-pay | Admitting: Otolaryngology

## 2013-11-11 ENCOUNTER — Encounter (HOSPITAL_COMMUNITY): Payer: Self-pay

## 2013-11-11 ENCOUNTER — Other Ambulatory Visit: Payer: Self-pay | Admitting: Otolaryngology

## 2013-11-11 DIAGNOSIS — D351 Benign neoplasm of parathyroid gland: Secondary | ICD-10-CM

## 2013-11-12 DIAGNOSIS — I10 Essential (primary) hypertension: Secondary | ICD-10-CM | POA: Diagnosis not present

## 2013-11-12 DIAGNOSIS — K5732 Diverticulitis of large intestine without perforation or abscess without bleeding: Secondary | ICD-10-CM | POA: Diagnosis not present

## 2013-11-13 ENCOUNTER — Other Ambulatory Visit (HOSPITAL_COMMUNITY): Payer: Medicare Other

## 2013-11-13 ENCOUNTER — Encounter (HOSPITAL_COMMUNITY)
Admission: RE | Admit: 2013-11-13 | Discharge: 2013-11-13 | Disposition: A | Discharge status: Home / Self Care | Payer: Medicare Other | Source: Ambulatory Visit | Attending: Otolaryngology | Admitting: Otolaryngology

## 2013-11-13 ENCOUNTER — Encounter (HOSPITAL_COMMUNITY): Payer: Self-pay

## 2013-11-13 DIAGNOSIS — D351 Benign neoplasm of parathyroid gland: Secondary | ICD-10-CM | POA: Insufficient documentation

## 2013-11-13 DIAGNOSIS — E0789 Other specified disorders of thyroid: Secondary | ICD-10-CM | POA: Diagnosis not present

## 2013-11-13 MED ORDER — TECHNETIUM TC 99M SESTAMIBI GENERIC - CARDIOLITE
25.0000 | Freq: Once | INTRAVENOUS | Status: AC | PRN
  Administered 2013-11-13: 25 via INTRAVENOUS

## 2013-11-21 ENCOUNTER — Encounter (HOSPITAL_COMMUNITY): Payer: Self-pay

## 2013-11-26 NOTE — Pre-Procedure Instructions (Signed)
[name]  11/26/2013   Your procedure is scheduled on:  Wed, April 1 @ 10:30 AM  Report to [facility]  at 8:30  AM.  Call this number if you have problems the morning of surgery: [phone]   Remember:   Do not eat food or drink liquids after midnight.   Take these medicines the morning of surgery with A SIP OF WATER: Esomeprazole(Nexium) and Synthroid(Levothyroxine)              Stop taking your Aleve.No Goody's,BC's,Aleve,Aspirin,Ibuprofen,Fish Oil,or any Herbal Medications   Do not wear jewelry, make-up or nail polish.  Do not wear lotions, powders, or perfumes. You may wear deodorant.  Do not shave 48 hours prior to surgery.   Do not bring valuables to the hospital.  [facility] is not responsible                  for any belongings or valuables.               Contacts, dentures or bridgework may not be worn into surgery.  Leave suitcase in the car. After surgery it may be brought to your room.  For patients admitted to the hospital, discharge time is determined by your                treatment team.               Patients discharged the day of surgery will not be allowed to drive  home.    Special Instructions:  [place] - Preparing for Surgery  Before surgery, you can play an important role.  Because skin is not sterile, your skin needs to be as free of germs as possible.  You can reduce the number of germs on you skin by washing with CHG (chlorahexidine gluconate) soap before surgery.  CHG is an antiseptic cleaner which kills germs and bonds with the skin to continue killing germs even after washing.  Please DO NOT use if you have an allergy to CHG or antibacterial soaps.  If your skin becomes reddened/irritated stop using the CHG and inform your nurse when you arrive at Short Stay.  Do not shave (including legs and underarms) for at least 48 hours prior to the first CHG shower.  You may shave your face.  Please follow these instructions carefully:   1.   Shower with CHG Soap the night before surgery and the                                morning of Surgery.  2.  If you choose to wash your hair, wash your hair first as usual with your       normal shampoo.  3.  After you shampoo, rinse your hair and body thoroughly to remove the                      Shampoo.  4.  Use CHG as you would any other liquid soap.  You can apply chg directly       to the skin and wash gently with scrungie or a clean washcloth.  5.  Apply the CHG Soap to your body ONLY FROM THE NECK DOWN.        Do not use on open wounds or open sores.  Avoid contact with your eyes,       ears, mouth  and genitals (private parts).  Wash genitals (private parts)       with your normal soap.  6.  Wash thoroughly, paying special attention to the area where your surgery        will be performed.  7.  Thoroughly rinse your body with warm water from the neck down.  8.  DO NOT shower/wash with your normal soap after using and rinsing off       the CHG Soap.  9.  Pat yourself dry with a clean towel.            10.  Wear clean pajamas.            11.  Place clean sheets on your bed the night of your first shower and do not        sleep with pets.  Day of Surgery  Do not apply any lotions/deoderants the morning of surgery.  Please wear clean clothes to the hospital/surgery center.     Please read over the following fact sheets that you were given: Pain Booklet, Coughing and Deep Breathing and Surgical Site Infection Prevention

## 2013-11-27 ENCOUNTER — Encounter (HOSPITAL_COMMUNITY)
Admission: RE | Admit: 2013-11-27 | Discharge: 2013-11-27 | Disposition: A | Discharge status: Home / Self Care | Payer: Medicare Other | Source: Ambulatory Visit | Attending: Otolaryngology | Admitting: Otolaryngology

## 2013-11-27 ENCOUNTER — Encounter (HOSPITAL_COMMUNITY): Payer: Self-pay

## 2013-11-27 DIAGNOSIS — Z01812 Encounter for preprocedural laboratory examination: Secondary | ICD-10-CM | POA: Diagnosis not present

## 2013-11-27 HISTORY — DX: Effusion, unspecified joint: M25.40

## 2013-11-27 HISTORY — DX: Headache: R51

## 2013-11-27 HISTORY — DX: Gastro-esophageal reflux disease without esophagitis: K21.9

## 2013-11-27 HISTORY — DX: Personal history of urinary calculi: Z87.442

## 2013-11-27 HISTORY — DX: Cervicalgia: M54.2

## 2013-11-27 HISTORY — DX: Pain in unspecified joint: M25.50

## 2013-11-27 HISTORY — DX: Hyperlipidemia, unspecified: E78.5

## 2013-11-27 HISTORY — DX: Other specified postprocedural states: Z98.890

## 2013-11-27 HISTORY — DX: Weakness: R53.1

## 2013-11-27 HISTORY — DX: Personal history of other diseases of the digestive system: Z87.19

## 2013-11-27 HISTORY — DX: Personal history of other infectious and parasitic diseases: Z86.19

## 2013-11-27 HISTORY — DX: Reserved for inherently not codable concepts without codable children: IMO0001

## 2013-11-27 HISTORY — DX: Nausea with vomiting, unspecified: R11.2

## 2013-11-27 HISTORY — DX: Unspecified hearing loss, unspecified ear: H91.90

## 2013-11-27 LAB — CBC
HEMATOCRIT: 33.1 % — AB (ref 36.0–46.0)
Hemoglobin: 10.5 g/dL — ABNORMAL LOW (ref 12.0–15.0)
MCH: 27.8 pg (ref 26.0–34.0)
MCHC: 31.7 g/dL (ref 30.0–36.0)
MCV: 87.6 fL (ref 78.0–100.0)
PLATELETS: 227 10*3/uL (ref 150–400)
RBC: 3.78 MIL/uL — ABNORMAL LOW (ref 3.87–5.11)
RDW: 14.7 % (ref 11.5–15.5)
WBC: 8.1 10*3/uL (ref 4.0–10.5)

## 2013-11-27 LAB — BASIC METABOLIC PANEL
BUN: 14 mg/dL (ref 6–23)
CHLORIDE: 103 meq/L (ref 96–112)
CO2: 25 mEq/L (ref 19–32)
Calcium: 8.7 mg/dL (ref 8.4–10.5)
Creatinine, Ser: 0.86 mg/dL (ref 0.50–1.10)
GFR, EST AFRICAN AMERICAN: 74 mL/min — AB (ref >90)
GFR, EST NON AFRICAN AMERICAN: 64 mL/min — AB (ref >90)
Glucose, Bld: 84 mg/dL (ref 70–99)
POTASSIUM: 3.8 meq/L (ref 3.7–5.3)
SODIUM: 140 meq/L (ref 137–147)

## 2013-11-27 NOTE — Progress Notes (Addendum)
Saw a cardiologist 45yrs ago  Heart cath done x 3;last one being done in the 90's   Echo reports in epic from 2011 and 2013  Stress test in epic from 2000  EKG and CXR in epic from 10-26-13  Medical Md is Dr.Zach [place] Crane

## 2013-11-28 NOTE — Progress Notes (Signed)
Anesthesia Chart Review:  Patient is a 79 year old female scheduled for parathyroidectomy on 12/03/13 for parathyroid adenoma by Dr. Benjamine Mola.    History includes non-smoker, breast cancer s/p left lumpectomy  '03, hiatal hernia, impaired hearing, GERD, HTN, HLD, nephrolithiasis, vasovagal syncope, thyroidectomy with secondary hypothyroidism, post-operative N/V, headaches, IBS, mandibular reconstruction, diverticulitis. PCP is Dr. [name].  EKG on 10/26/13 showed NSR, LAD, possible anterior infarct (cited on or before 12/31/12).   Echo on 02/22/12 showed: Normal LV size and systolic function, EF 22-02%. Grade 1 diastolic dysfunction. Trivial MR. Mild biatrial enlargement. Normal RV size and systolic function. Borderline pulmonary hypertension.PA systolic pressure 54-27 mmHg.  Previous discharge summary in 12/2001 and office note from Dr. Lattie Haw in 12/2007 (found in e-chart) indicate that she had normal coronaries by cath on 12/09/01 and a negative stress test in 2009.    No significant carotid artery stenosis by duplex on 02/22/12.  CXR on 10/26/13 showed: Stable postoperative and chronic findings. No superimposed acute process  Preoperative labs noted.  EKG appears stable. No CV symptoms documented from her PAT visit.  If no acute changes then I anticipate that she can proceed as planned.  George Hugh [facility] Short Stay Center/Anesthesiology Phone [phone] 11/28/2013 9:45 AM

## 2013-12-03 ENCOUNTER — Encounter (HOSPITAL_COMMUNITY)
Admission: RE | Disposition: A | Discharge status: Home / Self Care | Payer: Self-pay | Source: Ambulatory Visit | Attending: Otolaryngology

## 2013-12-03 ENCOUNTER — Encounter (HOSPITAL_COMMUNITY): Payer: Medicare Other | Admitting: Vascular Surgery

## 2013-12-03 ENCOUNTER — Encounter (HOSPITAL_COMMUNITY): Payer: Self-pay | Admitting: *Deleted

## 2013-12-03 ENCOUNTER — Ambulatory Visit (HOSPITAL_COMMUNITY)
Admission: RE | Admit: 2013-12-03 | Discharge: 2013-12-04 | Disposition: A | Discharge status: Home / Self Care | Payer: Medicare Other | Source: Ambulatory Visit | Attending: Otolaryngology | Admitting: Otolaryngology

## 2013-12-03 ENCOUNTER — Ambulatory Visit (HOSPITAL_COMMUNITY): Payer: Medicare Other | Admitting: Anesthesiology

## 2013-12-03 DIAGNOSIS — Z9889 Other specified postprocedural states: Secondary | ICD-10-CM

## 2013-12-03 DIAGNOSIS — M810 Age-related osteoporosis without current pathological fracture: Secondary | ICD-10-CM | POA: Diagnosis not present

## 2013-12-03 DIAGNOSIS — I10 Essential (primary) hypertension: Secondary | ICD-10-CM | POA: Insufficient documentation

## 2013-12-03 DIAGNOSIS — K219 Gastro-esophageal reflux disease without esophagitis: Secondary | ICD-10-CM | POA: Diagnosis not present

## 2013-12-03 DIAGNOSIS — M129 Arthropathy, unspecified: Secondary | ICD-10-CM | POA: Diagnosis not present

## 2013-12-03 DIAGNOSIS — H409 Unspecified glaucoma: Secondary | ICD-10-CM | POA: Insufficient documentation

## 2013-12-03 DIAGNOSIS — K589 Irritable bowel syndrome without diarrhea: Secondary | ICD-10-CM | POA: Diagnosis not present

## 2013-12-03 DIAGNOSIS — Z96659 Presence of unspecified artificial knee joint: Secondary | ICD-10-CM | POA: Diagnosis not present

## 2013-12-03 DIAGNOSIS — D351 Benign neoplasm of parathyroid gland: Secondary | ICD-10-CM | POA: Diagnosis not present

## 2013-12-03 DIAGNOSIS — H269 Unspecified cataract: Secondary | ICD-10-CM | POA: Insufficient documentation

## 2013-12-03 DIAGNOSIS — Z853 Personal history of malignant neoplasm of breast: Secondary | ICD-10-CM | POA: Insufficient documentation

## 2013-12-03 DIAGNOSIS — E892 Postprocedural hypoparathyroidism: Secondary | ICD-10-CM

## 2013-12-03 DIAGNOSIS — K5732 Diverticulitis of large intestine without perforation or abscess without bleeding: Secondary | ICD-10-CM | POA: Diagnosis not present

## 2013-12-03 DIAGNOSIS — Z9089 Acquired absence of other organs: Secondary | ICD-10-CM

## 2013-12-03 DIAGNOSIS — D449 Neoplasm of uncertain behavior of unspecified endocrine gland: Secondary | ICD-10-CM | POA: Diagnosis not present

## 2013-12-03 HISTORY — PX: THYROIDECTOMY: SHX17

## 2013-12-03 HISTORY — PX: PARATHYROIDECTOMY: SHX19

## 2013-12-03 LAB — CALCIUM: CALCIUM: 9.1 mg/dL (ref 8.4–10.5)

## 2013-12-03 SURGERY — THYROIDECTOMY
Anesthesia: General | Site: Neck

## 2013-12-03 MED ORDER — SUCCINYLCHOLINE CHLORIDE 20 MG/ML IJ SOLN
INTRAMUSCULAR | Status: AC
  Filled 2013-12-03: qty 1

## 2013-12-03 MED ORDER — ARTIFICIAL TEARS OP OINT
TOPICAL_OINTMENT | OPHTHALMIC | Status: AC
  Filled 2013-12-03: qty 3.5

## 2013-12-03 MED ORDER — FENTANYL CITRATE 0.05 MG/ML IJ SOLN
25.0000 ug | INTRAMUSCULAR | Status: DC | PRN
  Administered 2013-12-03 (×2): 25 ug via INTRAVENOUS

## 2013-12-03 MED ORDER — LIDOCAINE HCL (CARDIAC) 20 MG/ML IV SOLN
INTRAVENOUS | Status: DC | PRN
  Administered 2013-12-03: 50 mg via INTRAVENOUS

## 2013-12-03 MED ORDER — ARTIFICIAL TEARS OP OINT
TOPICAL_OINTMENT | OPHTHALMIC | Status: DC | PRN
  Administered 2013-12-03: 1 via OPHTHALMIC

## 2013-12-03 MED ORDER — IRBESARTAN 75 MG PO TABS
75.0000 mg | ORAL_TABLET | Freq: Every day | ORAL | Status: DC
  Administered 2013-12-03: 75 mg via ORAL
  Filled 2013-12-03 (×2): qty 1

## 2013-12-03 MED ORDER — ONDANSETRON HCL 4 MG/2ML IJ SOLN
4.0000 mg | INTRAMUSCULAR | Status: DC | PRN
  Administered 2013-12-04: 4 mg via INTRAVENOUS
  Filled 2013-12-03: qty 2

## 2013-12-03 MED ORDER — FENTANYL CITRATE 0.05 MG/ML IJ SOLN
INTRAMUSCULAR | Status: AC
  Filled 2013-12-03: qty 5

## 2013-12-03 MED ORDER — ONDANSETRON HCL 4 MG/2ML IJ SOLN
INTRAMUSCULAR | Status: AC
  Filled 2013-12-03: qty 2

## 2013-12-03 MED ORDER — LACTATED RINGERS IV SOLN
INTRAVENOUS | Status: DC | PRN
  Administered 2013-12-03: 11:00:00 via INTRAVENOUS

## 2013-12-03 MED ORDER — SUCCINYLCHOLINE CHLORIDE 20 MG/ML IJ SOLN
INTRAMUSCULAR | Status: DC | PRN
  Administered 2013-12-03: 100 mg via INTRAVENOUS

## 2013-12-03 MED ORDER — PHENYLEPHRINE HCL 10 MG/ML IJ SOLN
INTRAMUSCULAR | Status: DC | PRN
  Administered 2013-12-03 (×2): 80 ug via INTRAVENOUS

## 2013-12-03 MED ORDER — FENTANYL CITRATE 0.05 MG/ML IJ SOLN
INTRAMUSCULAR | Status: AC
  Filled 2013-12-03: qty 2

## 2013-12-03 MED ORDER — PROPOFOL 10 MG/ML IV BOLUS
INTRAVENOUS | Status: AC
  Filled 2013-12-03: qty 20

## 2013-12-03 MED ORDER — KCL IN DEXTROSE-NACL 20-5-0.45 MEQ/L-%-% IV SOLN
INTRAVENOUS | Status: DC
  Administered 2013-12-03: 15:00:00 via INTRAVENOUS
  Filled 2013-12-03 (×2): qty 1000

## 2013-12-03 MED ORDER — MORPHINE SULFATE 2 MG/ML IJ SOLN
1.0000 mg | INTRAMUSCULAR | Status: DC | PRN
  Administered 2013-12-03: 2 mg via INTRAVENOUS
  Filled 2013-12-03: qty 1

## 2013-12-03 MED ORDER — PROPOFOL 10 MG/ML IV BOLUS
INTRAVENOUS | Status: DC | PRN
  Administered 2013-12-03: 150 mg via INTRAVENOUS
  Administered 2013-12-03: 20 mg via INTRAVENOUS
  Administered 2013-12-03: 10 mg via INTRAVENOUS
  Administered 2013-12-03: 50 mg via INTRAVENOUS
  Administered 2013-12-03: 20 mg via INTRAVENOUS

## 2013-12-03 MED ORDER — ONDANSETRON HCL 4 MG/2ML IJ SOLN
INTRAMUSCULAR | Status: DC | PRN
  Administered 2013-12-03: 4 mg via INTRAVENOUS

## 2013-12-03 MED ORDER — ONDANSETRON HCL 4 MG/2ML IJ SOLN: 4.0000 mg | Freq: Once | INTRAMUSCULAR | Status: DC | PRN

## 2013-12-03 MED ORDER — ONDANSETRON HCL 4 MG PO TABS
4.0000 mg | ORAL_TABLET | ORAL | Status: DC | PRN
Start: 2013-12-03 — End: 2013-12-04

## 2013-12-03 MED ORDER — HYDROCODONE-ACETAMINOPHEN 5-325 MG PO TABS
1.0000 | ORAL_TABLET | ORAL | Status: DC | PRN
  Administered 2013-12-03: 1 via ORAL
  Filled 2013-12-03: qty 1

## 2013-12-03 MED ORDER — EPHEDRINE SULFATE 50 MG/ML IJ SOLN
INTRAMUSCULAR | Status: DC | PRN
  Administered 2013-12-03: 25 mg via INTRAVENOUS

## 2013-12-03 MED ORDER — FENTANYL CITRATE 0.05 MG/ML IJ SOLN
INTRAMUSCULAR | Status: DC | PRN
  Administered 2013-12-03 (×2): 50 ug via INTRAVENOUS

## 2013-12-03 MED ORDER — CEFAZOLIN SODIUM 1-5 GM-% IV SOLN
1.0000 g | Freq: Three times a day (TID) | INTRAVENOUS | Status: AC
  Administered 2013-12-03 – 2013-12-04 (×3): 1 g via INTRAVENOUS
  Filled 2013-12-03 (×4): qty 50

## 2013-12-03 MED ORDER — ATROPINE SULFATE 0.1 MG/ML IJ SOLN
INTRAMUSCULAR | Status: AC
  Filled 2013-12-03: qty 10

## 2013-12-03 MED ORDER — LEVOTHYROXINE SODIUM 88 MCG PO TABS
88.0000 ug | ORAL_TABLET | Freq: Every day | ORAL | Status: DC
  Administered 2013-12-04: 88 ug via ORAL
  Filled 2013-12-03 (×2): qty 1

## 2013-12-03 MED ORDER — LIDOCAINE-EPINEPHRINE 1 %-1:100000 IJ SOLN
INTRAMUSCULAR | Status: AC
  Filled 2013-12-03: qty 1

## 2013-12-03 MED ORDER — PHENYLEPHRINE HCL 10 MG/ML IJ SOLN
INTRAMUSCULAR | Status: AC
  Filled 2013-12-03: qty 1

## 2013-12-03 MED ORDER — SIMVASTATIN 10 MG PO TABS
10.0000 mg | ORAL_TABLET | Freq: Every day | ORAL | Status: DC
  Administered 2013-12-03: 10 mg via ORAL
  Filled 2013-12-03 (×2): qty 1

## 2013-12-03 MED ORDER — EPHEDRINE SULFATE 50 MG/ML IJ SOLN
INTRAMUSCULAR | Status: AC
  Filled 2013-12-03: qty 1

## 2013-12-03 MED ORDER — ATROPINE SULFATE 1 MG/ML IJ SOLN
INTRAMUSCULAR | Status: DC | PRN
  Administered 2013-12-03 (×2): 0.2 mg via INTRAVENOUS

## 2013-12-03 MED ORDER — LIDOCAINE HCL (CARDIAC) 20 MG/ML IV SOLN
INTRAVENOUS | Status: AC
  Filled 2013-12-03: qty 5

## 2013-12-03 MED ORDER — LIDOCAINE-EPINEPHRINE 1 %-1:100000 IJ SOLN
INTRAMUSCULAR | Status: DC | PRN
  Administered 2013-12-03: 20 mL

## 2013-12-03 SURGICAL SUPPLY — 46 items
ADH SKN CLS APL DERMABOND .7 (GAUZE/BANDAGES/DRESSINGS) ×1
ATTRACTOMAT 16X20 MAGNETIC DRP (DRAPES) ×3 IMPLANT
BLADE SURG CLIPPER 3M 9600 (MISCELLANEOUS) IMPLANT
CANISTER SUCTION 2500CC (MISCELLANEOUS) ×3 IMPLANT
CLEANER TIP ELECTROSURG 2X2 (MISCELLANEOUS) ×3 IMPLANT
CLIP TI WIDE RED SMALL 24 (CLIP) IMPLANT
CONT SPEC 4OZ CLIKSEAL STRL BL (MISCELLANEOUS) ×2 IMPLANT
CORDS BIPOLAR (ELECTRODE) ×3 IMPLANT
COVER SURGICAL LIGHT HANDLE (MISCELLANEOUS) ×3 IMPLANT
CRADLE DONUT ADULT HEAD (MISCELLANEOUS) ×3 IMPLANT
DERMABOND ADVANCED (GAUZE/BANDAGES/DRESSINGS) ×2
DERMABOND ADVANCED .7 DNX12 (GAUZE/BANDAGES/DRESSINGS) IMPLANT
DRAIN CHANNEL 10F 3/8 F FF (DRAIN) ×1 IMPLANT
ELECT COATED BLADE 2.86 ST (ELECTRODE) ×3 IMPLANT
ELECT REM PT RETURN 9FT ADLT (ELECTROSURGICAL) ×3
ELECTRODE REM PT RTRN 9FT ADLT (ELECTROSURGICAL) ×1 IMPLANT
EVACUATOR SILICONE 100CC (DRAIN) ×1 IMPLANT
GAUZE SPONGE 4X4 16PLY XRAY LF (GAUZE/BANDAGES/DRESSINGS) IMPLANT
GLOVE BIO SURGEON STRL SZ 6.5 (GLOVE) ×1 IMPLANT
GLOVE BIO SURGEONS STRL SZ 6.5 (GLOVE)
GLOVE BIOGEL PI IND STRL 6.5 (GLOVE) ×1 IMPLANT
GLOVE BIOGEL PI IND STRL 8 (GLOVE) ×1 IMPLANT
GLOVE BIOGEL PI INDICATOR 6.5 (GLOVE) ×6
GLOVE BIOGEL PI INDICATOR 8 (GLOVE) ×2
GLOVE ECLIPSE 7.5 STRL STRAW (GLOVE) IMPLANT
GLOVE SURG SS PI 6.5 STRL IVOR (GLOVE) ×2 IMPLANT
GLOVE SURG SS PI 7.5 STRL IVOR (GLOVE) ×4 IMPLANT
GOWN STRL REUS W/ TWL LRG LVL3 (GOWN DISPOSABLE) ×3 IMPLANT
GOWN STRL REUS W/TWL LRG LVL3 (GOWN DISPOSABLE) ×15
HEMOSTAT SURGICEL .5X2 ABSORB (HEMOSTASIS) IMPLANT
HEMOSTAT SURGICEL 2X14 (HEMOSTASIS) ×2 IMPLANT
KIT BASIN OR (CUSTOM PROCEDURE TRAY) ×3 IMPLANT
KIT ROOM TURNOVER OR (KITS) ×3 IMPLANT
LOCATOR NERVE 3 VOLT (DISPOSABLE) ×3 IMPLANT
NS IRRIG 1000ML POUR BTL (IV SOLUTION) ×3 IMPLANT
PAD ARMBOARD 7.5X6 YLW CONV (MISCELLANEOUS) ×3 IMPLANT
PENCIL BUTTON HOLSTER BLD 10FT (ELECTRODE) ×3 IMPLANT
SHEARS HARMONIC 9CM CVD (BLADE) ×3 IMPLANT
SPONGE INTESTINAL PEANUT (DISPOSABLE) ×3 IMPLANT
SUT ETHILON 2 0 FS 18 (SUTURE) ×1 IMPLANT
SUT SILK 2 0 FS (SUTURE) ×3 IMPLANT
SUT SILK 3 0 REEL (SUTURE) ×3 IMPLANT
SUT VICRYL 4-0 PS2 18IN ABS (SUTURE) ×6 IMPLANT
TOWEL OR 17X26 10 PK STRL BLUE (TOWEL DISPOSABLE) ×3 IMPLANT
TRAY ENT MC OR (CUSTOM PROCEDURE TRAY) ×3 IMPLANT
TRAY FOLEY CATH 14FRSI W/METER (CATHETERS) IMPLANT

## 2013-12-03 NOTE — Anesthesia Preprocedure Evaluation (Signed)
Anesthesia Evaluation  Patient identified by MRN, date of birth, ID band Patient awake    Reviewed: Allergy & Precautions, H&P , NPO status , Patient's Chart, lab work & pertinent test results  Airway Mallampati: II TM Distance: >3 FB Neck ROM: Full    Dental  (+) Edentulous Upper, Edentulous Lower   Pulmonary  breath sounds clear to auscultation        Cardiovascular hypertension, Rhythm:Regular Rate:Normal     Neuro/Psych    GI/Hepatic   Endo/Other    Renal/GU      Musculoskeletal   Abdominal   Peds  Hematology   Anesthesia Other Findings   Reproductive/Obstetrics                           Anesthesia Physical Anesthesia Plan  ASA: III  Anesthesia Plan: General   Post-op Pain Management:    Induction: Intravenous  Airway Management Planned: Oral ETT  Additional Equipment:   Intra-op Plan:   Post-operative Plan: Extubation in OR  Informed Consent: I have reviewed the patients History and Physical, chart, labs and discussed the procedure including the risks, benefits and alternatives for the proposed anesthesia with the patient or authorized representative who has indicated his/her understanding and acceptance.     Plan Discussed with: CRNA and Anesthesiologist  Anesthesia Plan Comments:         Anesthesia Quick Evaluation

## 2013-12-03 NOTE — H&P (Signed)
Cc: Parathyroid mass, possible adenoma  HPI: The patient is a 79 year old female who presents today with her daughter.  The patient is seen in consultation requested by the [facility].  According to the patient, she was recently noted to have elevated calcium.  Her parathyroid hormone level was also significantly elevated.  Her last level was 136.9.  She underwent a neck ultrasound evaluation which showed soft tissue mass in the right neck at the region of the thyroid gland.  It should be noted the patient previously underwent bilateral thyroidectomy more than 10 years ago.  The history is therefore suggestive of a parathyroid adenoma.  Currently the patient complains of frequent choking sensation in her throat. She also complains of occasional hoarseness.  She denies any significant odynophagia.   The patient's review of systems (constitutional, eyes, ENT, cardiovascular, respiratory, GI, musculoskeletal, skin, neurologic, psychiatric, endocrine, hematologic, allergic) is noted in the ROS questionnaire.  Past Medical History (Major events, hospitalizations, surgeries):  Breast Cancer, Thyroid surgery twice, left eye surgery, mouth surgery, bilateral knee replacement, gallbladder removed, Kidney stones, Hiatal hernia, wrist surgery.     Known allergies: Oxycodone, Cipro, Metronidazole.     Ongoing medical problems: Weight loss, cataracts, glaucoma, hypertension, reflux, nausea, arthritis, osteoporosis, breast cancer, thyroid disease, breast Cancer, IBS, Diverticulitis, dyslexia.     Family medical history: Heart disease, Thyroid cancer.     Social history: The patient is married. She denies the use of alcohol, tobacco or illegal drugs.  Exam: General: Communicates without difficulty, well nourished, no acute distress.    Head: Normocephalic, no evidence injury, no tenderness, facial buttresses intact without stepoff.    Eyes: PERRL, EOMI. No scleral icterus, conjunctivae clear.     Neuro: CN II exam reveals vision grossly intact.  No nystagmus at any point of gaze.    Ears: Auricles well formed without lesions.  Ear canals are intact without mass or lesion.  No erythema or edema is appreciated.  The TMs are intact without fluid.    Nose: External evaluation reveals normal support and skin without lesions.  Dorsum is intact.  Anterior rhinoscopy reveals healthy pink mucosa over anterior aspect of inferior turbinates and intact septum.  No purulence noted.    Oral:  Oral cavity and oropharynx are intact, symmetric, without erythema or edema.  Mucosa is moist without lesions.    Neck: Full range of motion without pain.  There is no significant lymphadenopathy.  No masses palpable.  An old anterior neck incision is well healed.  Parotid glands and submandibular glands equal bilaterally without mass.  Trachea is midline.    Neuro:  CN 2-12 grossly intact.  A: 1.  A 2.4 cm right neck soft tissue mass.  The patient previously underwent a total thyroidectomy more than 10 years ago.   2.  Elevated serum calcium and parathyroid hormone.  3.  The patient's history is suggestive of parathyroid adenoma.   4.  No suspicious mass or lesion is noted on todays laryngoscopy evaluation.  Her vocal cords are mobile bilaterally.  P: 1.  The physical exam and laryngoscopy findings are reviewed with the patient and her daughter.   2.  I would like to obtain sestamibi parathyroid nuclear scan.  If a parathyroid adenoma is noted, the patient will benefit from undergoing the parathyroidectomy surgery.   3.  The risks, benefits, alternatives and details of the procedure are reviewed with the patient.  4.  We will contact the patient regarding the results of  the sestamibi scan.

## 2013-12-03 NOTE — Brief Op Note (Signed)
12/03/2013  12:12 PM  PATIENT:  [name]  79 y.o. female  PRE-OPERATIVE DIAGNOSIS:  parathyroid adenoma  POST-OPERATIVE DIAGNOSIS:  parathyroid adenoma  PROCEDURE:  Procedure(s): PARATHYROIDECTOMY (N/A)  SURGEON:  Surgeon(s) and Role:    * [name], MD - Primary  PHYSICIAN ASSISTANT: Rometta Emery, PA-C  ASSISTANTS: Rometta Emery, PA-C   ANESTHESIA:   general  EBL:  Total I/O In: 600 [I.V.:600] Out: -   BLOOD ADMINISTERED:none  DRAINS: none   LOCAL MEDICATIONS USED:  LIDOCAINE   SPECIMEN:  Source of Specimen:  parathyroid adenoma  DISPOSITION OF SPECIMEN:  PATHOLOGY  COUNTS:  YES  TOURNIQUET:  * No tourniquets in log *  DICTATION: .Other Dictation: Dictation Number [id number]  PLAN OF CARE: Admit for overnight observation  PATIENT DISPOSITION:  PACU - hemodynamically stable.   Delay start of Pharmacological VTE agent (>24hrs) due to surgical blood loss or risk of bleeding: not applicable

## 2013-12-03 NOTE — Op Note (Signed)
NAMENIALA, STCHARLES NO.:  [id number]  MEDICAL RECORD NO.:  [id number]  LOCATION:  6N16C                        FACILITY:  [facility]  PHYSICIAN:  Leta Baptist, MD            DATE OF BIRTH:  [birth year]  DATE OF PROCEDURE:  12/03/2013 DATE OF DISCHARGE:                              OPERATIVE REPORT   SURGEON:  Leta Baptist, MD  PREOPERATIVE DIAGNOSIS:  Parathyroid adenoma.  POSTOPERATIVE DIAGNOSIS:  Parathyroid adenoma.  PROCEDURE PERFORMED:  Parathyroidectomy.  ANESTHESIA:  General endotracheal tube anesthesia.  COMPLICATIONS:  None.  ESTIMATED BLOOD LOSS:  Minimal.  INDICATION FOR PROCEDURE:  The patient is a 79 year old female who was recently noted to have elevated parathyroid hormone and calcium.  She underwent an ultrasound, which showed soft tissue mass in the anterior neck region.  Her nuclear medicine study was suggestive of a left parathyroid adenoma.  Based on the above findings, the decision was made for the patient to undergo the above-stated procedure.  It should be noted that the patient previously underwent a total thyroidectomy surgery more than 10 years ago.  The risks, benefits, alternatives, and details of the procedure were discussed with the patient.  Questions were invited and answered.  Informed consent was obtained.  DESCRIPTION:  The patient was taken to the operating room and placed supine on the operating table.  General endotracheal tube anesthesia was administered by the anesthesiologist.  The patient was positioned and prepped and draped in a standard fashion for parathyroidectomy surgery. A 1% lidocaine with 1:100,000 epinephrine was injected at the planned site of incision.  A transverse lower neck incision was made over her previous scar.  The incision was carried down to the level of the platysma muscles.  Superiorly based subplatysmal flap was elevated in a standard fashion.  The strap muscles were then divided at midline  and retracted laterally.  At this time, a 2-cm soft tissue mass was noted at the anterior neck, overlying the trachea.  The entire soft tissue mass was removed.  It was sent to the Pathology Department for frozen section analysis.  The result was consistent with hyperplastic parathyroid tissue.  Examination around the anterior neck revealed no other parathyroid adenoma candidate.  The surgical site was copiously irrigated.  The strap muscles were closed with running 4-0 Vicryl sutures.  The incision was closed in layers with 4-0 Vicryl and Dermabond.  The care of the patient was turned over to the anesthesiologist.  The patient was awakened from anesthesia without difficulty.  She was extubated and transferred to the recovery room in good condition.  OPERATIVE FINDINGS:  A 2 cm anterior parathyroid adenoma was noted.  SPECIMEN:  Parathyroid adenoma.  FOLLOWUP CARE:  The patient will be observed overnight.  Her calcium level will be monitored.  She will most likely be discharged home on postop day #1.     Leta Baptist, MD     ST/MEDQ  D:  12/03/2013  T:  12/03/2013  Job:  [id number]

## 2013-12-03 NOTE — Progress Notes (Signed)
Patient up to bathroom, off monitors

## 2013-12-03 NOTE — Anesthesia Postprocedure Evaluation (Signed)
  Anesthesia Post-op Note  Patient: [name]  Procedure(s) Performed: Procedure(s): PARATHYROIDECTOMY (N/A)  Patient Location: PACU  Anesthesia Type:General  Level of Consciousness: awake, alert  and oriented  Airway and Oxygen Therapy: Patient Spontanous Breathing and Patient connected to nasal cannula oxygen  Post-op Pain: mild  Post-op Assessment: Post-op Vital signs reviewed, Patient's Cardiovascular Status Stable, Respiratory Function Stable, Patent Airway and Pain level controlled  Post-op Vital Signs: stable  Complications: No apparent anesthesia complications

## 2013-12-03 NOTE — Transfer of Care (Signed)
Immediate Anesthesia Transfer of Care Note  Patient: [name]  Procedure(s) Performed: Procedure(s): PARATHYROIDECTOMY (N/A)  Patient Location: PACU  Anesthesia Type:General  Level of Consciousness: awake, alert , oriented and sedated  Airway & Oxygen Therapy: Patient Spontanous Breathing and Patient connected to nasal cannula oxygen  Post-op Assessment: Report given to PACU RN, Post -op Vital signs reviewed and stable and Patient moving all extremities  Post vital signs: Reviewed and stable  Complications: No apparent anesthesia complications

## 2013-12-04 ENCOUNTER — Encounter (HOSPITAL_COMMUNITY): Payer: Self-pay | Admitting: Otolaryngology

## 2013-12-04 LAB — PTH, INTACT AND CALCIUM
CALCIUM TOTAL (PTH): 9.1 mg/dL (ref 8.4–10.5)
CALCIUM TOTAL (PTH): 8.8 mg/dL (ref 8.4–10.5)
PTH: 5.8 pg/mL — ABNORMAL LOW (ref 14.0–72.0)
PTH: 6.7 pg/mL — ABNORMAL LOW (ref 14.0–72.0)

## 2013-12-04 LAB — CALCIUM
Calcium: 7.7 mg/dL — ABNORMAL LOW (ref 8.4–10.5)
Calcium: 8.5 mg/dL (ref 8.4–10.5)

## 2013-12-04 MED ORDER — HYDROCODONE-ACETAMINOPHEN 5-325 MG PO TABS: 1.0000 | ORAL_TABLET | ORAL | Status: DC | PRN

## 2013-12-04 NOTE — Discharge Summary (Signed)
Physician Discharge Summary  Patient ID: [name] MRN: [id number] DOB/AGE: Jan 30, 1935 79 y.o.  Admit date: 12/03/2013 Discharge date: 12/04/2013  Admission Diagnoses: Parathyroid adenoma  Discharge Diagnoses: Parathyroid adenoma Active Problems:   S/P parathyroidectomy   Discharged Condition: good  Hospital Course: Pt had an uneventful overnight stay. Pt tolerated po well. No bleeding. No stridor. No hoarseness  Consults: None  Significant Diagnostic Studies: labs: PTH (pending) and Total calcium  Treatments: surgery: Parathyroidectomy  Discharge Exam: Blood pressure 114/54, pulse 59, temperature 99.1 F (37.3 C), temperature source Oral, resp. rate 16, height 5\' 6"  (1.676 m), weight 149 lb 14.6 oz (68 kg), SpO2 99.00%. Incision/Wound: c/d/i  Disposition: 01-Home or Self Care  Discharge Orders   Future Appointments Provider Department Dept Phone   12/19/2013 1:00 PM [facility] Covering Provider [place] [phone]   04/06/2014 11:00 AM [facility] [address] [place] [phone]   Please arrive 15 minutes prior to your appointment time. Any medications can be taken as usual.   04/10/2014 10:30 AM [facility] Covering Provider [place] [phone]   09/17/2014 10:45 AM [facility] Team A [place] [phone]   Future Orders Complete By Expires   Diet general  As directed    Increase activity slowly  As directed        Medication List         ALEVE 220 MG Caps  Generic drug:  Naproxen Sodium  Take 220 mg by mouth as needed (pain).     CENTRUM SILVER ADULT 50+ PO  Take 1 capsule by mouth daily.     DIOVAN 80 MG tablet  Generic drug:  valsartan  Take 80 mg by mouth daily.     esomeprazole 40 MG capsule  Commonly known as:  NEXIUM  Take 40 mg by mouth daily.     HYDROcodone-acetaminophen 5-325 MG per tablet  Commonly known as:  NORCO/VICODIN  Take 1 tablet by mouth every 4 (four) hours as needed  for moderate pain or severe pain.     levothyroxine 88 MCG tablet  Commonly known as:  SYNTHROID, LEVOTHROID  Take 88 mcg by mouth daily before breakfast.     pravastatin 20 MG tablet  Commonly known as:  PRAVACHOL  Take 20 mg by mouth daily.     Vitamin B-12 2500 MCG Subl  Place 1 tablet under the tongue daily.           Follow-up Information   Follow up with [name], MD On 12/11/2013. (as scheduled)    Specialty:  Otolaryngology   Contact information:   [address] [place] [address] [phone]       Signed: [name] 12/04/2013, 8:15 AM

## 2013-12-04 NOTE — Progress Notes (Signed)
DC home with daughter, verbally understood DC instructions, no questions ask 

## 2013-12-04 NOTE — Discharge Instructions (Signed)
Parathyroidectomy A parathyroidectomy is surgery to remove one or more parathyroid glands. These glands produce a hormone (parathyroid hormone) that helps control the level of calcium in your body. The glands are very small, about the size of a pea. They are located in your neck, close to your thyroid gland and your Adam's apple. Most people (85%) have four parathyroid glands,some people may have one or two more than that. Hyperparathyroidism is when too much parathyroid hormone is being produced. Usually this is caused by one of the parathyroid glands becoming enlarged, but it can also be caused by more than one of the glands. Hyperparathyroidism is found during blood tests that show high calcium in the blood. Parathyroid hormone levels will also be elevated. Cancer also can cause hyperparathyroidism, but this is rare. For the most common type of hyperparathyroidism, the treatment is surgical removal of the parathyroid gland that is enlarged. For patients with kidney failure and hyperparathyroidism, other treatment will be tried before surgery is done on the parathyroid.  Many times x-ray studies are done to find out which parathyroid gland or glands is malfunctioning. The decision about the best treatment for hyperparathyroidism is between the patient, their primary doctor, an endocrinologist, and a surgeon experienced in parathyroid surgery. LET YOUR CAREGIVER KNOW ABOUT:  Any allergies.  All medications you are taking, including:  Herbs, eyedrops, over-the-counter medications and creams.  Blood thinners (anticoagulants), aspirin or other drugs that could affect blood clotting.  Use of steroids (by mouth or as creams).  Previous problems with anesthetics, including local anesthetics.  Possibility of pregnancy, if this applies.  Any history of blood clots.  Any history of bleeding or other blood problems.  Previous surgery.  Smoking history.  Other health problems. RISKS AND  COMPLICATIONS   Short-term possibilities include:  Excessive bleeding.  Pain.  Infection near the incision.  Slow healing.  Pooling of blood under the wound (hematoma).  Damage to nerves in your neck.  Blood clots.  Difficulty breathing. This is very rare. It also is almost always temporary.  Longer-term possibilities include:  Scarring.  Skin damage.  Damage to blood vessels in the area.  Need for additional surgery.  A hoarse or weak voice. This is usually temporary. It can be the result of nerve damage.  Development of hypoparathyroidism. This means you are not making enough parathyroid hormone. It is rare. If it occurs, you will need to take calcium supplements daily. BEFORE THE PROCEDURE  Sometimes the surgery is done on an outpatient basis. This means you could go home the same day as your surgery. Other times, people need to stay in the hospital overnight. Ask your surgeon what you should expect.  If your surgery will be an outpatient procedure, arrange for someone to drive you home after the surgery.  Two weeks before your surgery, stop using aspirin and non-steroidal anti-inflammatory drugs (NSAID's) for pain relief. This includes prescription drugs and over-the-counter drugs such as ibuprofen and naproxen. Also stop taking vitamin E.  If you take blood-thinners, ask your healthcare provider when you should stop taking them.  Do not eat or drink for about 8 hours before your surgery.  You might be asked to shower or wash with a special antibacterial soap before the procedure.  Arrive at least an hour before the surgery, or whenever your surgeon recommends. This will give you time to check in and fill out any needed paperwork. PROCEDURE  The preparation:  You will change into a hospital gown.  You   will be given an IV. A needle will be inserted in your arm. Medication will be able to flow directly into your body through this needle.  You might be given a  sedative to help you relax.  You will be given a drug that puts you to sleep during the surgery (general anesthetic).  The procedure:  Once you are asleep, the surgeon will make a small cut (incision) in your lower neck. Ask your surgeon where the incision will be.  The surgeon will look for the gland(s) that are not working well. Often a tissue sample from a gland is used to determine this.  Any glands that are not working well will be removed.  The surgeon will close the incision with stitches, often these are hidden under the skin. AFTER THE PROCEDURE  You will stay in a recovery area until the anesthesia has worn off. Your blood pressure and heart rate will be checked.  If your surgery was an outpatient procedure, you will go home the same day.  If you need to stay in the hospital, you will be moved to a hospital room. You will probably stay for two to three days. This will depend on how quickly you recover.  While you are in the hospital, your blood will be tested to check the calcium levels in your body. HOME CARE INSTRUCTIONS   Take any medication that your surgeon prescribes. Follow the directions carefully. Take all of the medication.  Ask your surgeon whether you can take over-the-counter medicines for pain, discomfort or fever. Do not take aspirin without permission from the surgeon. Aspirin increases the chances of bleeding.  Do not get the wound wet for the first few days after surgery (or until the surgeon tells you it is OK).  After this procedure, many patients may develop low calcium levels in the blood. It is critical that you see your medical caregiver to have this monitored and managed.     SEEK MEDICAL CARE IF:   You notice blood or fluid leaking from the wound, or it becomes red or swollen.  You have trouble breathing.  You have trouble speaking.  You become nauseous or throw up for more than two days after the surgery.  You have a fever or  persistent symptoms for more than 2-3 days. SEEK IMMEDIATE MEDICAL CARE IF:   Breathing becomes more difficult.  You have a fever and your symptoms suddenly get worse. Document Released: 11/17/2008 Document Revised: 08/07/2012 Document Reviewed: 11/17/2008 ExitCare Patient Information 2014 [employer].  

## 2013-12-08 DIAGNOSIS — I889 Nonspecific lymphadenitis, unspecified: Secondary | ICD-10-CM | POA: Diagnosis not present

## 2013-12-11 ENCOUNTER — Ambulatory Visit (INDEPENDENT_AMBULATORY_CARE_PROVIDER_SITE_OTHER): Payer: Medicare Other | Admitting: Otolaryngology

## 2013-12-15 DIAGNOSIS — R221 Localized swelling, mass and lump, neck: Secondary | ICD-10-CM | POA: Diagnosis not present

## 2013-12-15 DIAGNOSIS — E618 Deficiency of other specified nutrient elements: Secondary | ICD-10-CM | POA: Diagnosis not present

## 2013-12-15 DIAGNOSIS — R220 Localized swelling, mass and lump, head: Secondary | ICD-10-CM | POA: Diagnosis not present

## 2013-12-19 ENCOUNTER — Encounter (HOSPITAL_COMMUNITY): Payer: Medicare Other | Attending: Oncology

## 2013-12-19 ENCOUNTER — Encounter (HOSPITAL_COMMUNITY): Payer: Self-pay

## 2013-12-19 DIAGNOSIS — E039 Hypothyroidism, unspecified: Secondary | ICD-10-CM | POA: Insufficient documentation

## 2013-12-19 DIAGNOSIS — D351 Benign neoplasm of parathyroid gland: Secondary | ICD-10-CM

## 2013-12-19 DIAGNOSIS — E0789 Other specified disorders of thyroid: Secondary | ICD-10-CM

## 2013-12-19 DIAGNOSIS — C50919 Malignant neoplasm of unspecified site of unspecified female breast: Secondary | ICD-10-CM | POA: Insufficient documentation

## 2013-12-19 DIAGNOSIS — M810 Age-related osteoporosis without current pathological fracture: Secondary | ICD-10-CM

## 2013-12-19 DIAGNOSIS — E349 Endocrine disorder, unspecified: Secondary | ICD-10-CM

## 2013-12-19 DIAGNOSIS — Z853 Personal history of malignant neoplasm of breast: Secondary | ICD-10-CM

## 2013-12-19 NOTE — Progress Notes (Signed)
[place], [employer], MD [place] [address]  DIAGNOSIS: Hypercalcemia - Plan: PTH, intact and calcium  Increased PTH level - Plan: PTH, intact and calcium  Breast cancer - Plan: CBC with Differential, Comprehensive metabolic panel  Hypothyroidism - Plan: TSH  Chief Complaint  Patient presents with   Parathyroid Adenoma    CURRENT THERAPY: Resection of parathyroid mass consistent with parathyroid hyperplasia in the setting of elevated PTH level and hypercalcemia.  INTERVAL HISTORY: [name] 79 y.o. female returns for followup after parathyroid surgery on 12/03/2013 at which time parathyroid hyperplasia was found in the setting of elevated PTH and hypercalcemia. She had previous parathyroid adenoma resected in 1980s. There is also a remote history of lobular carcinoma the left breast diagnosed in 2003 status post lumpectomy and sentinel node biopsy, radiotherapy, and 5 years of adjuvant endocrine treatment. She has done well since surgery. Appetite is good with no nausea or vomiting. She does have periods of constipation for which he uses laxatives. She avoids nuts because of diverticulosis. She does try to keep bulky foods. She denies any fever, night sweats, or peripheral paresthesias other than left upper extremity from previous neck surgery. She denies dysuria, hematuria, urinary hesitancy, or incontinence. No fever, night sweats, but with occasional abdominal discomfort. She also has right lateral chest wall discomfort following tattoo marks from previous radiotherapy. She does not experience nasal drip, sore throat, earache, cough, or wheezing.  MEDICAL HISTORY: Past Medical History  Diagnosis Date   History of esophageal spasm    Breast cancer 2003   Syncope, non cardiac    Osteoarthritis    Diverticulitis     History   Hypokalemia    Osteoporosis 09/08/2011   Dyslexia    Infiltrating lobular  carcinoma 04/04/2013   HTN (hypertension)     takes Diovan daily   Hyperlipidemia     takes Pravastatin daily   Hypothyroidism     takes Synthroid daily   GERD (gastroesophageal reflux disease)     takes Nexium daily   PONV (postoperative nausea and vomiting)    Headache(784.0)     all the time   Neck pain     pinched    Weakness     numbness left arm   Joint pain    Joint swelling    Impaired hearing    History of kidney stones    H/O hiatal hernia    History of IBS     no problem in past yr   History of shingles     INTERIM HISTORY: has FULL INCONTINENCE OF FECES; DIARRHEA; Osteoporosis; Syncope and collapse; Chest pain; Dehydration; Acute renal failure; HTN (hypertension); Hypothyroid; GERD (gastroesophageal reflux disease); Infiltrating lobular carcinoma, left breast; Diverticulitis; Generalized weakness; Acute diverticulitis; Neck mass; and S/P parathyroidectomy on her problem list.    ALLERGIES:  is allergic to metronidazole; oxycodone; ciprofloxacin; and latex.  MEDICATIONS: has a current medication list which includes the following prescription(s): vitamin b-12, esomeprazole, hydrocodone-acetaminophen, levothyroxine, multiple vitamins-minerals, naproxen sodium, pravastatin, and valsartan.  SURGICAL HISTORY:  Past Surgical History  Procedure Laterality Date   Thyroidectomy      Hypothyroidism status post   Cholecystectomy  1997    Status post -stones   Laparoscopic nissen fundoplication  3491   Arm fx      left   Breast lumpectomy      Left   Knee surgery  bilateral   Kidney stone surgery  1992   Mandible reconstruction     Cataract extraction     Neck surgery  2000    [facility]   Cataract extraction w/phaco  10/02/2011    Procedure: CATARACT EXTRACTION PHACO AND INTRAOCULAR LENS PLACEMENT (Meta);  Surgeon: [name], MD;  Location: [facility];  Service: Ophthalmology;  Laterality: Right;  CDE:17.01   Kidney stent  06/04/11    removed  after about 3 weeks.   Lithotripsy  11/12   Tubal ligation     Wrist surgery Left 2009   Shoulder surgery Left    Nissen fundoplication     Egd with dilitation     Colonoscopy     Cardiac catheterization      x 3-last one in the 90's per pt   Parathyroidectomy  12/03/2013    DR TEOH   Thyroidectomy N/A 12/03/2013    Procedure: PARATHYROIDECTOMY;  Surgeon: [name], MD;  Location: [facility] OR;  Service: ENT;  Laterality: N/A;    FAMILY HISTORY: family history includes Macular degeneration in her sister. There is no history of Anesthesia problems, Hypotension, Pseudochol deficiency, or Malignant hyperthermia.  SOCIAL HISTORY:  reports that she has never smoked. She has never used smokeless tobacco. She reports that she does not drink alcohol or use illicit drugs.  REVIEW OF SYSTEMS:  Other than that discussed above is noncontributory.  PHYSICAL EXAMINATION: ECOG PERFORMANCE STATUS: 1 - Symptomatic but completely ambulatory  There were no vitals taken for this visit.  GENERAL:alert, no distress and comfortable SKIN: skin color, texture, turgor are normal, no rashes or significant lesions EYES: PERLA; Conjunctiva are pink and non-injected, sclera clear SINUSES: No redness or tenderness over maxillary or ethmoid sinuses OROPHARYNX:no exudate, no erythema on lips, buccal mucosa, or tongue. NECK: Supple with no masses. Lower neck surgical wounds are well-healed. CHEST: Status post right breast lumpectomy with no masses in either breast. LYMPH:  no palpable lymphadenopathy in the cervical, axillary or inguinal LUNGS: clear to auscultation and percussion with normal breathing effort HEART: regular rate & rhythm and no murmurs. ABDOMEN:abdomen soft, non-tender and normal bowel sounds MUSCULOSKELETAL:no cyanosis of digits and no clubbing. Range of motion normal. Decreased sensation left upper extremity. NEURO: alert & oriented x 3 with fluent speech, no focal motor/sensory deficits.  Chvostek"s Sign is negative.   LABORATORY DATA:  Results for SAFIA, PANZER (MRN [id number]) as of 12/19/2013 12:51  Ref. Range 09/10/2013 11:00 12/03/2013 13:50 12/03/2013 19:22  PTH Latest Range: 14.0-72.0 pg/mL 136.9 (H) 5.8 (L) post-op 6.7 (L)   Results for TALI, CLEAVES (MRN [id number]) as of 12/19/2013 12:51  Ref. Range 10/27/2013 04:52 11/27/2013 11:37 12/03/2013 19:22 12/03/2013 23:34 12/04/2013 05:52  Calcium Latest Range: 8.4-10.5 mg/dL 8.5 8.7 9.1 7.7 (L) 8.5    Admission on 12/03/2013, Discharged on 12/04/2013  Component Date Value Ref Range Status   PTH 12/03/2013 5.8* 14.0 - 72.0 pg/mL Final   Calcium, Total (PTH) 12/03/2013 9.1  8.4 - 10.5 mg/dL Final   Performed at [facility]   Calcium 12/03/2013 9.1  8.4 - 10.5 mg/dL Final   Calcium 12/03/2013 7.7* 8.4 - 10.5 mg/dL Final   PTH 12/03/2013 6.7* 14.0 - 72.0 pg/mL Final   Calcium, Total (PTH) 12/03/2013 8.8  8.4 - 10.5 mg/dL Final   Performed at [facility]   Calcium 12/04/2013 8.5  8.4 - 10.5 mg/dL Final  Hospital Outpatient Visit on 11/27/2013  Component Date Value Ref Range Status  WBC 11/27/2013 8.1  4.0 - 10.5 K/uL Final   RBC 11/27/2013 3.78* 3.87 - 5.11 MIL/uL Final   Hemoglobin 11/27/2013 10.5* 12.0 - 15.0 g/dL Final   HCT 11/27/2013 33.1* 36.0 - 46.0 % Final   MCV 11/27/2013 87.6  78.0 - 100.0 fL Final   MCH 11/27/2013 27.8  26.0 - 34.0 pg Final   MCHC 11/27/2013 31.7  30.0 - 36.0 g/dL Final   RDW 11/27/2013 14.7  11.5 - 15.5 % Final   Platelets 11/27/2013 227  150 - 400 K/uL Final   Sodium 11/27/2013 140  137 - 147 mEq/L Final   Potassium 11/27/2013 3.8  3.7 - 5.3 mEq/L Final   Chloride 11/27/2013 103  96 - 112 mEq/L Final   CO2 11/27/2013 25  19 - 32 mEq/L Final   Glucose, Bld 11/27/2013 84  70 - 99 mg/dL Final   BUN 11/27/2013 14  6 - 23 mg/dL Final   Creatinine, Ser 11/27/2013 0.86  0.50 - 1.10 mg/dL Final   Calcium 11/27/2013 8.7  8.4 - 10.5 mg/dL Final   GFR calc  non Af Amer 11/27/2013 64* >90 mL/min Final   GFR calc Af Amer 11/27/2013 74* >90 mL/min Final   Comment: (NOTE)                          The eGFR has been calculated using the CKD EPI equation.                          This calculation has not been validated in all clinical situations.                          eGFR's persistently <90 mL/min signify possible Chronic Kidney                          Disease.    PATHOLOGY:  1410) Patient: ZYA, FINKLE Collected: 12/03/2013 Client: [facility] Accession: [id number] Received: 12/03/2013 Raylene Miyamoto, MD DOB: [birth year] Age: 33 Gender: F Reported: 12/04/2013 [address] Patient Ph: [phone] MRN #: [id number] [place], [place] [address] Visit #: [id number] Chart #: Phone:  Fax: CC: REPORT OF SURGICAL PATHOLOGY FINAL DIAGNOSIS Diagnosis Soft tissue, biopsy, Anterior neck - HYPERCELLULAR PARATHYROID TISSUE, 0.7 GRAMS. - SEE COMMENT. Microscopic Comment Sections demonstrate hypercellular parathyroid tissue. The findings may represent a parathyroid adenoma versus parathyroid hyperplasia. Please correlate with clinical and operative impression. Within the specimen there is no significant atypia or evidence of malignancy. (RH:kh 12/04/13) Willeen Niece MD Pathologist, Electronic Signature (Case signed 12/04/2013) Intraoperative Diagnosis RAPID INTRAOPERATIVE CONSULT - ANTERIOR NECK MASS, FROZEN SECTION DIAGNOSIS: HYPERCELLULAR PARATHYROID TISSUE (0.7 GRAMS). (RH) Specimen Gross and Clinical Information Specimen(s) Obtained: Soft tissue, biopsy, Anterior neck Specimen Clinical Information Parathyroid adenoma (tl) Gross Received fresh for rapid intraoperative consult is a 1.8 x 1 x 0.8 cm rubbery tan-brown ovoid nodule, which weighs 0.7 grams. The cut surface is solid. A section is submitted for frozen section and the remainder is entirely submitted in one cassette. (GRP:ecj 12/03/2013) Report signed out from the  following location(s) Technical Component and Interpretation performed at [place], [place], [place] [address]. CLIA #: [id number],   Urinalysis    Component Value Date/Time   COLORURINE YELLOW 10/26/2013 0840   APPEARANCEUR HAZY* 10/26/2013 0840   LABSPEC 1.015 10/26/2013 0840  PHURINE 5.5 10/26/2013 0840   GLUCOSEU NEGATIVE 10/26/2013 0840   HGBUR LARGE* 10/26/2013 0840   BILIRUBINUR NEGATIVE 10/26/2013 0840   KETONESUR NEGATIVE 10/26/2013 0840   PROTEINUR TRACE* 10/26/2013 0840   UROBILINOGEN 0.2 10/26/2013 0840   NITRITE NEGATIVE 10/26/2013 0840   LEUKOCYTESUR MODERATE* 10/26/2013 0840    RADIOGRAPHIC STUDIES: NM Parathyroid W/Spect Status: Final result         PACS Images    Show images for NM Parathyroid W/Spect         Study Result    CLINICAL DATA: Parathyroid adenoma, prior thyroidectomy  EXAM:  NM PARATHYROID SCINTIGRAPHY AND SPECT IMAGING  TECHNIQUE:  Following intravenous administration of radiopharmaceutical, early  and 2-hour delayed planar images were obtained in the anterior  projection. Delayed triplanar SPECT images were also obtained at 2  hours.  RADIOPHARMACEUTICALS: 25 mCi Tc-1mSestamibi IV  COMPARISON: None  FINDINGS:  Planar images obtained immediately, at 1 hr and at 2 hr demonstrate  focal retention of tracer at the inferior pole of the left thyroid  region suspicious for an inferior left parathyroid adenoma.  Patient is reportedly post thyroidectomy.  A small amount of initial tracer accumulation in the right thyroid  bed is seen which predominately washes out.  Single subtle focus of questionable increase localization remains on  plantar images at the right cervical region superiorly.  SPECT images obtained at 2 hr demonstrate subtle focus of increased  tracer localization at the expected position of the inferior left  parathyroid gland.  Findings are suspicious for a left inferior parathyroid adenoma.    No definite abnormal right-sided localization of tracer is seen on  SPECT images.  No other abnormal areas of sestamibi accumulation are identified.  Mediastinum is clear on plantar images.  IMPRESSION:  Focus of increased residual tracer localization at the inferior left  thyroid region suspicious for an inferior left parathyroid adenoma.  Plantar images raise question of a subtle focus of increased  localization at the expected position of the superior right  parathyroid gland but this is not definitely confirmed on SPECT  images.  Electronically Signed  By: MLavonia DanaM.D.  On: 11/13/2013      ASSESSMENT:  #1. Parathyroid adenoma, status post resection with normalization of PTH level and calcium. #2.Stage II A. (T2, N0, M0) infiltrating lobular carcinoma left breast diagnosed in 2003 status post partial mastectomy on 02/25/2002 with a 3.0 x 3.5 cm primary ER +99% PR +80% HER-2/neu nonamplified and Ki-67 marker was low at 10% surgery with negative sentinel nodes felt to be well-differentiated status post radiation therapy followed by Arimidex initially which she could not tolerate followed by letrozole and she completed 5 full years of adjuvant hormonal therapy.  #3. Osteoporosis, on yearly intravenous bisphosphonate therapy.  #4. Left lingular lung nodule, coalesced, probably inflammatory.  #5. Hypothyroidism, on treatment. #6. Left upper extremity radiculopathy after neck surgery in the past, stable.    PLAN:  #1. Continue current medications. #2. Followup in July 2015 with CBC, chem profile, PTH level. #3. Patient receives zoledronic acid intravenously yearly in January.   All questions were answered. The patient knows to call the clinic with any problems, questions or concerns. We can certainly see the patient much sooner if necessary.   I spent 25 minutes counseling the patient face to face. The total time spent in the appointment was 30 minutes.    GFarrel Gobble  MD 12/19/2013 12:44 PM

## 2013-12-19 NOTE — Patient Instructions (Addendum)
[facility] Discharge Instructions  RECOMMENDATIONS MADE BY THE CONSULTANT AND ANY TEST RESULTS WILL BE SENT TO YOUR REFERRING PHYSICIAN.  EXAM FINDINGS BY THE PHYSICIAN TODAY AND SIGNS OR SYMPTOMS TO REPORT TO CLINIC OR PRIMARY PHYSICIAN: Exam and findings as discussed by Dr. Barnet Glasgow.  Blood work is good since surgery.  PTH level is normal now.  Symptoms may improve over time.  Bone density in August.  MEDICATIONS PRESCRIBED:  none  INSTRUCTIONS/FOLLOW-UP: August with lab work and office visit.  Thank you for choosing [facility] to provide your oncology and hematology care.  To afford each patient quality time with our providers, please arrive at least 15 minutes before your scheduled appointment time.  With your help, our goal is to use those 15 minutes to complete the necessary work-up to ensure our physicians have the information they need to help with your evaluation and healthcare recommendations.    Effective January 1st, 2014, we ask that you re-schedule your appointment with our physicians should you arrive 10 or more minutes late for your appointment.  We strive to give you quality time with our providers, and arriving late affects you and other patients whose appointments are after yours.    Again, thank you for choosing [facility].  Our hope is that these requests will decrease the amount of time that you wait before being seen by our physicians.       _____________________________________________________________  Should you have questions after your visit to [facility], please contact our office at [phone] between the hours of 8:30 a.m. and 5:00 p.m.  Voicemails left after 4:30 p.m. will not be returned until the following business day.  For prescription refill requests, have your pharmacy contact our office with your prescription refill request.

## 2014-01-22 ENCOUNTER — Ambulatory Visit (INDEPENDENT_AMBULATORY_CARE_PROVIDER_SITE_OTHER): Payer: Medicare Other | Admitting: Otolaryngology

## 2014-03-18 ENCOUNTER — Other Ambulatory Visit (HOSPITAL_COMMUNITY): Payer: Medicare Other

## 2014-03-19 ENCOUNTER — Ambulatory Visit (HOSPITAL_COMMUNITY): Payer: Medicare Other

## 2014-04-06 ENCOUNTER — Ambulatory Visit (HOSPITAL_COMMUNITY)
Admission: RE | Admit: 2014-04-06 | Discharge: 2014-04-06 | Disposition: A | Discharge status: Home / Self Care | Payer: Medicare Other | Source: Ambulatory Visit | Attending: Oncology | Admitting: Oncology

## 2014-04-06 ENCOUNTER — Encounter (HOSPITAL_COMMUNITY): Payer: Medicare Other | Attending: Hematology and Oncology

## 2014-04-06 DIAGNOSIS — C50919 Malignant neoplasm of unspecified site of unspecified female breast: Secondary | ICD-10-CM

## 2014-04-06 DIAGNOSIS — E785 Hyperlipidemia, unspecified: Secondary | ICD-10-CM | POA: Diagnosis not present

## 2014-04-06 DIAGNOSIS — E039 Hypothyroidism, unspecified: Secondary | ICD-10-CM | POA: Diagnosis not present

## 2014-04-06 DIAGNOSIS — Z923 Personal history of irradiation: Secondary | ICD-10-CM | POA: Diagnosis not present

## 2014-04-06 DIAGNOSIS — M810 Age-related osteoporosis without current pathological fracture: Secondary | ICD-10-CM | POA: Insufficient documentation

## 2014-04-06 DIAGNOSIS — R911 Solitary pulmonary nodule: Secondary | ICD-10-CM | POA: Insufficient documentation

## 2014-04-06 DIAGNOSIS — E890 Postprocedural hypothyroidism: Secondary | ICD-10-CM | POA: Diagnosis not present

## 2014-04-06 DIAGNOSIS — R209 Unspecified disturbances of skin sensation: Secondary | ICD-10-CM | POA: Insufficient documentation

## 2014-04-06 DIAGNOSIS — K219 Gastro-esophageal reflux disease without esophagitis: Secondary | ICD-10-CM | POA: Diagnosis not present

## 2014-04-06 DIAGNOSIS — Z853 Personal history of malignant neoplasm of breast: Secondary | ICD-10-CM

## 2014-04-06 DIAGNOSIS — I10 Essential (primary) hypertension: Secondary | ICD-10-CM | POA: Diagnosis not present

## 2014-04-06 DIAGNOSIS — Z79899 Other long term (current) drug therapy: Secondary | ICD-10-CM | POA: Insufficient documentation

## 2014-04-06 DIAGNOSIS — M5412 Radiculopathy, cervical region: Secondary | ICD-10-CM | POA: Insufficient documentation

## 2014-04-06 DIAGNOSIS — Z780 Asymptomatic menopausal state: Secondary | ICD-10-CM | POA: Diagnosis not present

## 2014-04-06 DIAGNOSIS — E349 Endocrine disorder, unspecified: Secondary | ICD-10-CM

## 2014-04-06 LAB — COMPREHENSIVE METABOLIC PANEL
ALBUMIN: 3.4 g/dL — AB (ref 3.5–5.2)
ALT: 13 U/L (ref 0–35)
ANION GAP: 11 (ref 5–15)
AST: 23 U/L (ref 0–37)
Alkaline Phosphatase: 59 U/L (ref 39–117)
BUN: 19 mg/dL (ref 6–23)
CO2: 23 mEq/L (ref 19–32)
Calcium: 8.4 mg/dL (ref 8.4–10.5)
Chloride: 105 mEq/L (ref 96–112)
Creatinine, Ser: 0.92 mg/dL (ref 0.50–1.10)
GFR calc non Af Amer: 59 mL/min — ABNORMAL LOW (ref >90)
GFR, EST AFRICAN AMERICAN: 68 mL/min — AB (ref >90)
GLUCOSE: 92 mg/dL (ref 70–99)
Potassium: 3.9 mEq/L (ref 3.7–5.3)
Sodium: 139 mEq/L (ref 137–147)
TOTAL PROTEIN: 7 g/dL (ref 6.0–8.3)
Total Bilirubin: 0.3 mg/dL (ref 0.3–1.2)

## 2014-04-06 LAB — CBC WITH DIFFERENTIAL/PLATELET
Basophils Absolute: 0 10*3/uL (ref 0.0–0.1)
Basophils Relative: 0 % (ref 0–1)
EOS ABS: 0.2 10*3/uL (ref 0.0–0.7)
Eosinophils Relative: 2 % (ref 0–5)
HEMATOCRIT: 32.7 % — AB (ref 36.0–46.0)
HEMOGLOBIN: 10.5 g/dL — AB (ref 12.0–15.0)
Lymphocytes Relative: 19 % (ref 12–46)
Lymphs Abs: 2 10*3/uL (ref 0.7–4.0)
MCH: 28.2 pg (ref 26.0–34.0)
MCHC: 32.1 g/dL (ref 30.0–36.0)
MCV: 87.9 fL (ref 78.0–100.0)
MONO ABS: 0.7 10*3/uL (ref 0.1–1.0)
MONOS PCT: 6 % (ref 3–12)
NEUTROS PCT: 73 % (ref 43–77)
Neutro Abs: 7.7 10*3/uL (ref 1.7–7.7)
Platelets: 234 10*3/uL (ref 150–400)
RBC: 3.72 MIL/uL — ABNORMAL LOW (ref 3.87–5.11)
RDW: 13.9 % (ref 11.5–15.5)
WBC: 10.5 10*3/uL (ref 4.0–10.5)

## 2014-04-06 LAB — TSH: TSH: 2.69 u[IU]/mL (ref 0.350–4.500)

## 2014-04-06 NOTE — Progress Notes (Signed)
LABS DRAWN FOR CBCD,CMP,PTHI,TSH

## 2014-04-07 ENCOUNTER — Ambulatory Visit (HOSPITAL_COMMUNITY): Payer: Medicare Other

## 2014-04-07 LAB — PTH, INTACT AND CALCIUM
Calcium, Total (PTH): 8.3 mg/dL — ABNORMAL LOW (ref 8.4–10.5)
PTH: 60.8 pg/mL (ref 14.0–72.0)

## 2014-04-10 ENCOUNTER — Encounter (HOSPITAL_BASED_OUTPATIENT_CLINIC_OR_DEPARTMENT_OTHER): Payer: Medicare Other

## 2014-04-10 ENCOUNTER — Encounter (HOSPITAL_COMMUNITY): Payer: Self-pay

## 2014-04-10 VITALS — BP 140/80 | HR 71 | Temp 98.4°F | Resp 18 | Wt 157.0 lb

## 2014-04-10 DIAGNOSIS — C50919 Malignant neoplasm of unspecified site of unspecified female breast: Secondary | ICD-10-CM | POA: Diagnosis not present

## 2014-04-10 DIAGNOSIS — D351 Benign neoplasm of parathyroid gland: Secondary | ICD-10-CM

## 2014-04-10 DIAGNOSIS — C50912 Malignant neoplasm of unspecified site of left female breast: Secondary | ICD-10-CM

## 2014-04-10 DIAGNOSIS — M5412 Radiculopathy, cervical region: Secondary | ICD-10-CM

## 2014-04-10 DIAGNOSIS — E038 Other specified hypothyroidism: Secondary | ICD-10-CM | POA: Diagnosis not present

## 2014-04-10 MED ORDER — HYDROCODONE-ACETAMINOPHEN 5-325 MG PO TABS: 1.0000 | ORAL_TABLET | ORAL | Status: DC | PRN

## 2014-04-10 NOTE — Patient Instructions (Addendum)
[facility] Discharge Instructions  RECOMMENDATIONS MADE BY THE CONSULTANT AND ANY TEST RESULTS WILL BE SENT TO YOUR REFERRING PHYSICIAN.  EXAM FINDINGS BY THE PHYSICIAN TODAY AND SIGNS OR SYMPTOMS TO REPORT TO CLINIC OR PRIMARY PHYSICIAN: Exam and findings as discussed by Dr.Formanek.  MEDICATIONS PRESCRIBED:  Continue as prescribed.  Please call at least 2 days before your pain medication runs out.  INSTRUCTIONS/FOLLOW-UP: 1.  You will be scheduled for an open MRI of your cervical spine - they will contact your with appointment details. 2.  Please follow-up with [facility], neck specialist - that office will be contacting you for an appointment. 3.  Return in 3 months as scheduled for an office visit and lab work. 4.  Dr. Barnet Glasgow has written your for a soft cervical collar.  Thank you for choosing [facility] to provide your oncology and hematology care.  To afford each patient quality time with our providers, please arrive at least 15 minutes before your scheduled appointment time.  With your help, our goal is to use those 15 minutes to complete the necessary work-up to ensure our physicians have the information they need to help with your evaluation and healthcare recommendations.    Effective January 1st, 2014, we ask that you re-schedule your appointment with our physicians should you arrive 10 or more minutes late for your appointment.  We strive to give you quality time with our providers, and arriving late affects you and other patients whose appointments are after yours.    Again, thank you for choosing [facility].  Our hope is that these requests will decrease the amount of time that you wait before being seen by our physicians.       _____________________________________________________________  Should you have questions after your visit to [facility], please contact our office at [phone] between the  hours of 8:30 a.m. and 4:30 p.m.  Voicemails left after 4:30 p.m. will not be returned until the following business day.  For prescription refill requests, have your pharmacy contact our office with your prescription refill request.    _______________________________________________________________  We hope that we have given you very good care.  You may receive a patient satisfaction survey in the mail, please complete it and return it as soon as possible.  We value your feedback!  _______________________________________________________________  Have you asked about our STAR program?  STAR stands for Survivorship Training and Rehabilitation, and this is a nationally recognized cancer care program that focuses on survivorship and rehabilitation.  Cancer and cancer treatments may cause problems, such as, pain, making you feel tired and keeping you from doing the things that you need or want to do. Cancer rehabilitation can help. Our goal is to reduce these troubling effects and help you have the best quality of life possible.  You may receive a survey from a nurse that asks questions about your current state of health.  Based on the survey results, all eligible patients will be referred to the [facility] program for an evaluation so we can better serve you!  A frequently asked questions sheet is available upon request.

## 2014-04-10 NOTE — Progress Notes (Signed)
[place], [facility], [place]  [id number]  DIAGNOSIS: Breast cancer, left - Plan: CANCELED: CBC with Differential, CANCELED: Comprehensive metabolic panel  Parathyroid adenoma - Plan: CANCELED: Comprehensive metabolic panel, CANCELED: PTH, intact and calcium, CANCELED: Phosphorus  Other specified hypothyroidism  Chief Complaint  Patient presents with  . Left breast cancer  . Hyperparathyroidism    Status post parathyroidectomy    CURRENT THERAPY: Watchful expectation.  INTERVAL HISTORY: [name] 79 y.o. female returns for followup of hyperparathyroidism, status post parathyroidectomy on 12/03/2013. She has developed increasing numbness of the left upper extremity with radiation down to the right upper extremity. She denies any dysphagia, nausea, vomiting, diarrhea, constipation, urinary incontinence, lower extremity swelling or redness, PND, orthopnea, palpitations, skin rash, headache, or seizures.  MEDICAL HISTORY: Past Medical History  Diagnosis Date  . History of esophageal spasm   . Breast cancer 2003  . Syncope, non cardiac   . Osteoarthritis   . Diverticulitis     History  . Hypokalemia   . Osteoporosis 09/08/2011  . Dyslexia   . Infiltrating lobular carcinoma 04/04/2013  . HTN (hypertension)     takes Diovan daily  . Hyperlipidemia     takes Pravastatin daily  . Hypothyroidism     takes Synthroid daily  . GERD (gastroesophageal reflux disease)     takes Nexium daily  . PONV (postoperative nausea and vomiting)   . Headache(784.0)     all the time  . Neck pain     pinched   . Weakness     numbness left arm  . Joint pain   . Joint swelling   . Impaired hearing   . History of kidney stones   . H/O hiatal hernia   . History of IBS     no problem in past yr  . History of shingles     INTERIM HISTORY: has FULL INCONTINENCE OF FECES; DIARRHEA; Osteoporosis; Syncope and collapse;  Chest pain; Dehydration; Acute renal failure; HTN (hypertension); Hypothyroid; GERD (gastroesophageal reflux disease); Infiltrating lobular carcinoma, left breast; Diverticulitis; Generalized weakness; Acute diverticulitis; Neck mass; and S/P parathyroidectomy on her problem list.   Parathyroid surgery on 12/03/2013 at which time parathyroid hyperplasia was found in the setting of elevated PTH and hypercalcemia. She had previous parathyroid adenoma resected in 1980s. There is also a remote history of lobular carcinoma the left breast diagnosed in 2003 status post lumpectomy and sentinel node biopsy, radiotherapy, and 5 years of adjuvant endocrine treatment.  ALLERGIES:  is allergic to metronidazole; oxycodone; ciprofloxacin; and latex.  MEDICATIONS: has a current medication list which includes the following prescription(s): vitamin b-12, esomeprazole, levothyroxine, multiple vitamins-minerals, naproxen sodium, pravastatin, valsartan, and hydrocodone-acetaminophen.  SURGICAL HISTORY:  Past Surgical History  Procedure Laterality Date  . Thyroidectomy      Hypothyroidism status post  . Cholecystectomy  1997    Status post -stones  . Laparoscopic nissen fundoplication  0174  . Arm fx      left  . Breast lumpectomy      Left  . Knee surgery      bilateral  . Kidney stone surgery  1992  . Mandible reconstruction    . Cataract extraction    . Neck surgery  2000    [facility]  . Cataract extraction w/phaco  10/02/2011    Procedure: CATARACT EXTRACTION PHACO AND INTRAOCULAR LENS PLACEMENT (IOC);  Surgeon: [name], MD;  Location:  [facility];  Service: Ophthalmology;  Laterality: Right;  CDE:17.01  . Kidney stent  06/04/11    removed after about 3 weeks.  . Lithotripsy  11/12  . Tubal ligation    . Wrist surgery Left 2009  . Shoulder surgery Left   . Nissen fundoplication    . Egd with dilitation    . Colonoscopy    . Cardiac catheterization      x 3-last one in the 90's per pt  . Parathyroidectomy   12/03/2013    DR TEOH  . Thyroidectomy N/A 12/03/2013    Procedure: PARATHYROIDECTOMY;  Surgeon: [name], MD;  Location[facility] OR;  Service: ENT;  Laterality: N/A;    FAMILY HISTORY: family history includes Macular degeneration in her sister. There is no history of Anesthesia problems, Hypotension, Pseudochol deficiency, or Malignant hyperthermia.  SOCIAL HISTORY:  reports that she has never smoked. She has never used smokeless tobacco. She reports that she does not drink alcohol or use illicit drugs.  REVIEW OF SYSTEMS:  Other than that discussed above is noncontributory.  PHYSICAL EXAMINATION: ECOG PERFORMANCE STATUS: 1 - Symptomatic but completely ambulatory  Blood pressure 140/80, pulse 71, temperature 98.4 F (36.9 C), temperature source Oral, resp. rate 18, weight 157 lb (71.215 kg).  GENERAL:alert, no distress and comfortable SKIN: skin color, texture, turgor are normal, no rashes or significant lesions EYES: PERLA; Conjunctiva are pink and non-injected, sclera clear SINUSES: No redness or tenderness over maxillary or ethmoid sinuses OROPHARYNX:no exudate, no erythema on lips, buccal mucosa, or tongue. NECK: supple, thyroid normal size, non-tender, without nodularity. No masses. Status post left neck surgery with free range of motion in the [facility] direction as well as left and right rotation. CHEST: Status post right lumpectomy with no masses in either breast. Her LYMPH:  no palpable lymphadenopathy in the cervical, axillary or inguinal LUNGS: clear to auscultation and percussion with normal breathing effort HEART: regular rate & rhythm and no murmurs. ABDOMEN:abdomen soft, non-tender and normal bowel sounds MUSCULOSKELETAL:no cyanosis of digits and no clubbing. Range of motion normal.  NEURO: alert & oriented x 3 with fluent speech, no focal motor/sensory deficits   LABORATORY DATA: Lab on 04/06/2014  Component Date Value Ref Range Status  . WBC 04/06/2014 10.5  4.0 - 10.5 K/uL  Final  . RBC 04/06/2014 3.72* 3.87 - 5.11 MIL/uL Final  . Hemoglobin 04/06/2014 10.5* 12.0 - 15.0 g/dL Final  . HCT 04/06/2014 32.7* 36.0 - 46.0 % Final  . MCV 04/06/2014 87.9  78.0 - 100.0 fL Final  . MCH 04/06/2014 28.2  26.0 - 34.0 pg Final  . MCHC 04/06/2014 32.1  30.0 - 36.0 g/dL Final  . RDW 04/06/2014 13.9  11.5 - 15.5 % Final  . Platelets 04/06/2014 234  150 - 400 K/uL Final  . Neutrophils Relative % 04/06/2014 73  43 - 77 % Final  . Neutro Abs 04/06/2014 7.7  1.7 - 7.7 K/uL Final  . Lymphocytes Relative 04/06/2014 19  12 - 46 % Final  . Lymphs Abs 04/06/2014 2.0  0.7 - 4.0 K/uL Final  . Monocytes Relative 04/06/2014 6  3 - 12 % Final  . Monocytes Absolute 04/06/2014 0.7  0.1 - 1.0 K/uL Final  . Eosinophils Relative 04/06/2014 2  0 - 5 % Final  . Eosinophils Absolute 04/06/2014 0.2  0.0 - 0.7 K/uL Final  . Basophils Relative 04/06/2014 0  0 - 1 % Final  . Basophils Absolute 04/06/2014 0.0  0.0 - 0.1  K/uL Final  . Sodium 04/06/2014 139  137 - 147 mEq/L Final  . Potassium 04/06/2014 3.9  3.7 - 5.3 mEq/L Final  . Chloride 04/06/2014 105  96 - 112 mEq/L Final  . CO2 04/06/2014 23  19 - 32 mEq/L Final  . Glucose, Bld 04/06/2014 92  70 - 99 mg/dL Final  . BUN 04/06/2014 19  6 - 23 mg/dL Final  . Creatinine, Ser 04/06/2014 0.92  0.50 - 1.10 mg/dL Final  . Calcium 04/06/2014 8.4  8.4 - 10.5 mg/dL Final  . Total Protein 04/06/2014 7.0  6.0 - 8.3 g/dL Final  . Albumin 04/06/2014 3.4* 3.5 - 5.2 g/dL Final  . AST 04/06/2014 23  0 - 37 U/L Final  . ALT 04/06/2014 13  0 - 35 U/L Final  . Alkaline Phosphatase 04/06/2014 59  39 - 117 U/L Final  . Total Bilirubin 04/06/2014 0.3  0.3 - 1.2 mg/dL Final  . GFR calc non Af Amer 04/06/2014 59* >90 mL/min Final  . GFR calc Af Amer 04/06/2014 68* >90 mL/min Final   Comment: (NOTE)                          The eGFR has been calculated using the CKD EPI equation.                          This calculation has not been validated in all clinical  situations.                          eGFR's persistently <90 mL/min signify possible Chronic Kidney                          Disease.  . Anion gap 04/06/2014 11  5 - 15 Final  . PTH 04/06/2014 60.8  14.0 - 72.0 pg/mL Final  . Calcium, Total (PTH) 04/06/2014 8.3* 8.4 - 10.5 mg/dL Final   Performed at [facility]  . TSH 04/06/2014 2.690  0.350 - 4.500 uIU/mL Final   Performed at [facility]: No new pathology.  Urinalysis    Component Value Date/Time   COLORURINE YELLOW 10/26/2013 0840   APPEARANCEUR HAZY* 10/26/2013 0840   LABSPEC 1.015 10/26/2013 0840   PHURINE 5.5 10/26/2013 0840   GLUCOSEU NEGATIVE 10/26/2013 0840   HGBUR LARGE* 10/26/2013 0840   BILIRUBINUR NEGATIVE 10/26/2013 0840   KETONESUR NEGATIVE 10/26/2013 0840   PROTEINUR TRACE* 10/26/2013 0840   UROBILINOGEN 0.2 10/26/2013 0840   NITRITE NEGATIVE 10/26/2013 0840   LEUKOCYTESUR MODERATE* 10/26/2013 0840    RADIOGRAPHIC STUDIES: Dg Bone Density  04/06/2014   EXAM: DG DXA BONE DENSITY STUDY  The Bone Mineral Densitometry hard-copy report (which includes all data, graphical display, and FRAX results when applicable) has been sent directly to the ordering physician.  This report can also be obtained electronically by viewing images for this exam through the performing facility's EMR, or by logging directly into [employer].   Electronically Signed   By: Julian Hy M.D.   On: 04/06/2014 11:32    ASSESSMENT:  #1. Parathyroid adenoma, status post resection, normal PTH and calcium level now #2. Cervical radiculopathy #3. History of lobular carcinoma left breast, no evidence of disease. #4. Osteoporosis, on yearly Reclast. #5. Left lingular lung nodule, inflammatory. #6. Hypothyroidism.   PLAN:  #1. MRI cervical  spine with contrast. #2. Referral to [facility] orthopedics. #3. Zoledronic acid intravenously in January 2016. #4. Followup in 3 months with CBC, chem profile, PTH, and  phosphorus.   All questions were answered. The patient knows to call the clinic with any problems, questions or concerns. We can certainly see the patient much sooner if necessary.   I spent 30 minutes counseling the patient face to face. The total time spent in the appointment was 40 minutes.    Doroteo Bradford, MD 04/10/2014 10:42 AM  DISCLAIMER:  This note was dictated with voice recognition software.  Similar sounding words can inadvertently be transcribed inaccurately and may not be corrected upon review.

## 2014-04-15 DIAGNOSIS — M542 Cervicalgia: Secondary | ICD-10-CM | POA: Diagnosis not present

## 2014-04-15 DIAGNOSIS — M5090 Cervical disc disorder, unspecified, unspecified cervical region: Secondary | ICD-10-CM | POA: Diagnosis not present

## 2014-04-21 ENCOUNTER — Other Ambulatory Visit (HOSPITAL_COMMUNITY): Payer: Self-pay | Admitting: Physical Medicine and Rehabilitation

## 2014-04-21 DIAGNOSIS — M542 Cervicalgia: Secondary | ICD-10-CM

## 2014-04-24 ENCOUNTER — Other Ambulatory Visit (HOSPITAL_COMMUNITY): Payer: Medicare Other

## 2014-04-27 DIAGNOSIS — R5383 Other fatigue: Secondary | ICD-10-CM | POA: Diagnosis not present

## 2014-04-27 DIAGNOSIS — M542 Cervicalgia: Secondary | ICD-10-CM | POA: Diagnosis not present

## 2014-04-27 DIAGNOSIS — E039 Hypothyroidism, unspecified: Secondary | ICD-10-CM | POA: Diagnosis not present

## 2014-04-27 DIAGNOSIS — G609 Hereditary and idiopathic neuropathy, unspecified: Secondary | ICD-10-CM | POA: Diagnosis not present

## 2014-04-27 DIAGNOSIS — R5381 Other malaise: Secondary | ICD-10-CM | POA: Diagnosis not present

## 2014-04-27 DIAGNOSIS — R935 Abnormal findings on diagnostic imaging of other abdominal regions, including retroperitoneum: Secondary | ICD-10-CM | POA: Diagnosis not present

## 2014-04-27 DIAGNOSIS — R252 Cramp and spasm: Secondary | ICD-10-CM | POA: Diagnosis not present

## 2014-04-27 DIAGNOSIS — R229 Localized swelling, mass and lump, unspecified: Secondary | ICD-10-CM | POA: Diagnosis not present

## 2014-06-06 ENCOUNTER — Encounter (HOSPITAL_COMMUNITY): Payer: Self-pay | Admitting: Emergency Medicine

## 2014-06-06 ENCOUNTER — Emergency Department (HOSPITAL_COMMUNITY)
Admission: EM | Admit: 2014-06-06 | Discharge: 2014-06-06 | Disposition: A | Discharge status: Home / Self Care | Payer: Medicare Other | Attending: Emergency Medicine | Admitting: Emergency Medicine

## 2014-06-06 DIAGNOSIS — Z91040 Latex allergy status: Secondary | ICD-10-CM | POA: Insufficient documentation

## 2014-06-06 DIAGNOSIS — Z87442 Personal history of urinary calculi: Secondary | ICD-10-CM | POA: Insufficient documentation

## 2014-06-06 DIAGNOSIS — E785 Hyperlipidemia, unspecified: Secondary | ICD-10-CM | POA: Diagnosis not present

## 2014-06-06 DIAGNOSIS — R011 Cardiac murmur, unspecified: Secondary | ICD-10-CM | POA: Diagnosis not present

## 2014-06-06 DIAGNOSIS — Z791 Long term (current) use of non-steroidal anti-inflammatories (NSAID): Secondary | ICD-10-CM | POA: Diagnosis not present

## 2014-06-06 DIAGNOSIS — Z86000 Personal history of in-situ neoplasm of breast: Secondary | ICD-10-CM | POA: Insufficient documentation

## 2014-06-06 DIAGNOSIS — Z9851 Tubal ligation status: Secondary | ICD-10-CM | POA: Diagnosis not present

## 2014-06-06 DIAGNOSIS — M2550 Pain in unspecified joint: Secondary | ICD-10-CM | POA: Insufficient documentation

## 2014-06-06 DIAGNOSIS — Y929 Unspecified place or not applicable: Secondary | ICD-10-CM | POA: Insufficient documentation

## 2014-06-06 DIAGNOSIS — K219 Gastro-esophageal reflux disease without esophagitis: Secondary | ICD-10-CM | POA: Insufficient documentation

## 2014-06-06 DIAGNOSIS — Z9861 Coronary angioplasty status: Secondary | ICD-10-CM | POA: Insufficient documentation

## 2014-06-06 DIAGNOSIS — Z9889 Other specified postprocedural states: Secondary | ICD-10-CM | POA: Insufficient documentation

## 2014-06-06 DIAGNOSIS — Z8619 Personal history of other infectious and parasitic diseases: Secondary | ICD-10-CM | POA: Insufficient documentation

## 2014-06-06 DIAGNOSIS — Z9089 Acquired absence of other organs: Secondary | ICD-10-CM | POA: Diagnosis not present

## 2014-06-06 DIAGNOSIS — Z79899 Other long term (current) drug therapy: Secondary | ICD-10-CM | POA: Insufficient documentation

## 2014-06-06 DIAGNOSIS — E039 Hypothyroidism, unspecified: Secondary | ICD-10-CM | POA: Diagnosis not present

## 2014-06-06 DIAGNOSIS — Y9389 Activity, other specified: Secondary | ICD-10-CM | POA: Insufficient documentation

## 2014-06-06 DIAGNOSIS — Z853 Personal history of malignant neoplasm of breast: Secondary | ICD-10-CM | POA: Diagnosis not present

## 2014-06-06 DIAGNOSIS — W458XXA Other foreign body or object entering through skin, initial encounter: Secondary | ICD-10-CM | POA: Diagnosis not present

## 2014-06-06 DIAGNOSIS — Z8639 Personal history of other endocrine, nutritional and metabolic disease: Secondary | ICD-10-CM | POA: Insufficient documentation

## 2014-06-06 DIAGNOSIS — S199XXA Unspecified injury of neck, initial encounter: Secondary | ICD-10-CM | POA: Insufficient documentation

## 2014-06-06 DIAGNOSIS — S41111A Laceration without foreign body of right upper arm, initial encounter: Secondary | ICD-10-CM

## 2014-06-06 DIAGNOSIS — S51811A Laceration without foreign body of right forearm, initial encounter: Secondary | ICD-10-CM | POA: Insufficient documentation

## 2014-06-06 DIAGNOSIS — S59911A Unspecified injury of right forearm, initial encounter: Secondary | ICD-10-CM | POA: Diagnosis present

## 2014-06-06 DIAGNOSIS — R531 Weakness: Secondary | ICD-10-CM | POA: Insufficient documentation

## 2014-06-06 DIAGNOSIS — Z8719 Personal history of other diseases of the digestive system: Secondary | ICD-10-CM | POA: Insufficient documentation

## 2014-06-06 DIAGNOSIS — M1990 Unspecified osteoarthritis, unspecified site: Secondary | ICD-10-CM | POA: Insufficient documentation

## 2014-06-06 DIAGNOSIS — I10 Essential (primary) hypertension: Secondary | ICD-10-CM | POA: Diagnosis not present

## 2014-06-06 NOTE — ED Provider Notes (Signed)
  Face-to-face evaluation   History: She injured her right forearm, today, when he planter bounced across it, while moving flowers Physical exam: Right medial forearm with 3 superficial skin tears, minimally gaping, and not actively bleeding. Neurovascular intact distally, and motion intact in the right arm.  Wound closure, per Mr. Marshell Levan  Medical screening examination/treatment/procedure(s) were conducted as a shared visit with non-physician practitioner(s) and myself.  I personally evaluated the patient during the encounter  Richarda Blade, MD 06/06/14 [phone]

## 2014-06-06 NOTE — ED Notes (Signed)
Patient was moving plants when R arm gave way and rim of container fell, cutting 3 gashes in R forearm.  No acute bleeding noted at present.

## 2014-06-06 NOTE — ED Notes (Signed)
Dermabond at the bedside.  

## 2014-06-06 NOTE — ED Provider Notes (Signed)
CSN: [id number]     Arrival date & time 06/06/14  1211 History   First MD Initiated Contact with Patient 06/06/14 1251     No chief complaint on file.    (Consider location/radiation/quality/duration/timing/severity/associated sxs/prior Treatment) HPI Comments: Patient is a 79 year old female who presents to the emergency department with a complaint of lacerations to the right forearm. The patient states that she was moving plants, when she dropped a planter that caused multiple lacerations of the right forearm. Patient states she was able to control the bleeding by applying pressure. She thinks she is up-to-date on her tetanus at this time. She denies being on a anticoagulation medications.  The history is provided by the patient.    Past Medical History  Diagnosis Date  . History of esophageal spasm   . Breast cancer 2003  . Syncope, non cardiac   . Osteoarthritis   . Diverticulitis     History  . Hypokalemia   . Osteoporosis 09/08/2011  . Dyslexia   . Infiltrating lobular carcinoma 04/04/2013  . HTN (hypertension)     takes Diovan daily  . Hyperlipidemia     takes Pravastatin daily  . Hypothyroidism     takes Synthroid daily  . GERD (gastroesophageal reflux disease)     takes Nexium daily  . PONV (postoperative nausea and vomiting)   . Headache(784.0)     all the time  . Neck pain     pinched   . Weakness     numbness left arm  . Joint pain   . Joint swelling   . Impaired hearing   . History of kidney stones   . H/O hiatal hernia   . History of IBS     no problem in past yr  . History of shingles    Past Surgical History  Procedure Laterality Date  . Thyroidectomy      Hypothyroidism status post  . Cholecystectomy  1997    Status post -stones  . Laparoscopic nissen fundoplication  3329  . Arm fx      left  . Breast lumpectomy      Left  . Knee surgery      bilateral  . Kidney stone surgery  1992  . Mandible reconstruction    . Cataract extraction    .  Neck surgery  2000    [facility]  . Cataract extraction w/phaco  10/02/2011    Procedure: CATARACT EXTRACTION PHACO AND INTRAOCULAR LENS PLACEMENT (IOC);  Surgeon: [name], MD;  Location: [facility];  Service: Ophthalmology;  Laterality: Right;  CDE:17.01  . Kidney stent  06/04/11    removed after about 3 weeks.  . Lithotripsy  11/12  . Tubal ligation    . Wrist surgery Left 2009  . Shoulder surgery Left   . Nissen fundoplication    . Egd with dilitation    . Colonoscopy    . Cardiac catheterization      x 3-last one in the 90's per pt  . Parathyroidectomy  12/03/2013    DR TEOH  . Thyroidectomy N/A 12/03/2013    Procedure: PARATHYROIDECTOMY;  Surgeon: [name], MD;  Location: [facility] OR;  Service: ENT;  Laterality: N/A;   Family History  Problem Relation Age of Onset  . Anesthesia problems Neg Hx   . Hypotension Neg Hx   . Pseudochol deficiency Neg Hx   . Malignant hyperthermia Neg Hx   . Macular degeneration Sister    History  Substance Use Topics  .  Smoking status: Never Smoker   . Smokeless tobacco: Never Used  . Alcohol Use: No     Comment: occasionally   OB History   Grav Para Term Preterm Abortions TAB SAB Ect Mult Living                 Review of Systems  Constitutional: Negative for activity change.       All ROS Neg except as noted in HPI  Eyes: Negative for photophobia and discharge.  Respiratory: Negative for cough, shortness of breath and wheezing.   Cardiovascular: Negative for chest pain and palpitations.  Gastrointestinal: Negative for abdominal pain and blood in stool.  Genitourinary: Negative for dysuria, frequency and hematuria.  Musculoskeletal: Positive for arthralgias and neck pain. Negative for back pain.  Skin: Negative.   Neurological: Positive for weakness. Negative for dizziness, seizures and speech difficulty.  Psychiatric/Behavioral: Negative for hallucinations and confusion.      Allergies  Metronidazole; Oxycodone; Ciprofloxacin; and  Latex  Home Medications   Prior to Admission medications   Medication Sig Start Date End Date Taking? Authorizing Provider  Cyanocobalamin (VITAMIN B-12) 2500 MCG SUBL Place 1 tablet under the tongue daily.   Yes Historical Provider, MD  esomeprazole (NEXIUM) 40 MG capsule Take 40 mg by mouth daily.    Yes Historical Provider, MD  levothyroxine (SYNTHROID, LEVOTHROID) 88 MCG tablet Take 88 mcg by mouth daily before breakfast.   Yes Historical Provider, MD  Multiple Vitamins-Minerals (CENTRUM SILVER ADULT 50+ PO) Take 1 capsule by mouth daily.   Yes Historical Provider, MD  Naproxen Sodium (ALEVE) 220 MG CAPS Take 220 mg by mouth as needed (pain).    Yes Historical Provider, MD  pravastatin (PRAVACHOL) 20 MG tablet Take 20 mg by mouth daily.   Yes Historical Provider, MD  valsartan (DIOVAN) 80 MG tablet Take 80 mg by mouth daily.   Yes Historical Provider, MD   BP 183/75  Pulse 91  Temp(Src) 98.4 F (36.9 C) (Oral)  Resp 17  Ht 5\' 6"  (1.676 m)  Wt 158 lb (71.668 kg)  BMI 25.51 kg/m2  SpO2 99% Physical Exam  Nursing note and vitals reviewed. Constitutional: She is oriented to person, place, and time. She appears well-developed and well-nourished.  Non-toxic appearance.  HENT:  Head: Normocephalic.  Right Ear: Tympanic membrane and external ear normal.  Left Ear: Tympanic membrane and external ear normal.  Eyes: EOM and lids are normal. Pupils are equal, round, and reactive to light.  Neck: Normal range of motion. Neck supple. Carotid bruit is not present.  Cardiovascular: Normal rate, regular rhythm, intact distal pulses and normal pulses.   Murmur heard. Pulmonary/Chest: Breath sounds normal. No respiratory distress.  Abdominal: Soft. Bowel sounds are normal. There is no tenderness. There is no guarding.  Musculoskeletal: Normal range of motion.       Right forearm: She exhibits tenderness and laceration.       Arms: Good ROM of the right shoulder and elbow, wrist and  fingers. For lacerations present on the medial aspect of the right forearm. #1 measures 4.8 cm. #2 measures 2.4 cm. #3 measures 2.6 cm. #4 measures 1 cm. There is bruising of the medial palmar aspect of the right forearm. No deformity appreciated.  Lymphadenopathy:       Head (right side): No submandibular adenopathy present.       Head (left side): No submandibular adenopathy present.    She has no cervical adenopathy.  Neurological: She is alert and oriented  to person, place, and time. She has normal strength. No cranial nerve deficit or sensory deficit.  Skin: Skin is warm and dry.  Psychiatric: She has a normal mood and affect. Her speech is normal.    ED Course  Procedures   LACERATION REPAIR RIGHT FOREARM.  Patient identified by arm band. Permission for the procedure given by the patient.  The procedure for repair of the 4 lacerations to the right forearm were discussed with the patient in terms which he understood, and she is permission to proceed with the procedure.  The wound was cleansed with safe-cleanse. After the wound was dry. The patient was draped in the usual sterile manner. The wounds were repaired with Dermabond. The first one measures 4.8 cm. The second wound measures 2.4 cm. The third wound measures 2.6 cm. The fourth wound measures 1.0 cm. No dressing applied at this time. The patient has some bruising present on, but no other problems noted. Patient tolerated the procedure without problem. Labs Review Labs Reviewed - No data to display  Imaging Review No results found.   EKG Interpretation None      MDM  The patient sustained lacerations of the right forearm. Lacerations were repaired with Dermabond. Patient states her tetanus is up-to-date at this time. The patient is given instructions to return if any changes, problems, or concerns. There is no evidence of deformity to support fracture or dislocation. There is no evidence of red streaking or increased heat  to support infection.    Final diagnoses:  Lacerations of multiple sites of right arm, initial encounter    *I have reviewed nursing notes, vital signs, and all appropriate lab and imaging results for this patient.Lenox Ahr, PA-C 06/06/14 1356

## 2014-06-06 NOTE — Discharge Instructions (Signed)
Your wounds were repaired today with Dermabond. Please do not apply any form of dressing to them this evening, neither get them wet this evening. Please allow the Dermabond to come off on its own. Please observe for any signs of infection, including red streaking, pus under the glue, excessive swelling and heat to the laceration areas, or high fevers.

## 2014-06-06 NOTE — ED Notes (Signed)
Patient given discharge instruction, verbalized understand. Patient ambulatory out of the department.  

## 2014-06-09 DIAGNOSIS — Z23 Encounter for immunization: Secondary | ICD-10-CM | POA: Diagnosis not present

## 2014-07-07 ENCOUNTER — Ambulatory Visit (HOSPITAL_COMMUNITY): Payer: Medicare Other

## 2014-07-07 ENCOUNTER — Other Ambulatory Visit (HOSPITAL_COMMUNITY): Payer: Medicare Other

## 2014-07-08 ENCOUNTER — Encounter (HOSPITAL_COMMUNITY): Payer: Self-pay

## 2014-07-08 ENCOUNTER — Encounter (HOSPITAL_BASED_OUTPATIENT_CLINIC_OR_DEPARTMENT_OTHER): Payer: Medicare Other

## 2014-07-08 ENCOUNTER — Encounter (HOSPITAL_COMMUNITY): Payer: Medicare Other | Attending: Hematology and Oncology

## 2014-07-08 VITALS — BP 165/74 | HR 59 | Temp 98.9°F | Resp 16 | Wt 161.4 lb

## 2014-07-08 DIAGNOSIS — M810 Age-related osteoporosis without current pathological fracture: Secondary | ICD-10-CM | POA: Diagnosis not present

## 2014-07-08 DIAGNOSIS — M5412 Radiculopathy, cervical region: Secondary | ICD-10-CM | POA: Insufficient documentation

## 2014-07-08 DIAGNOSIS — D351 Benign neoplasm of parathyroid gland: Secondary | ICD-10-CM

## 2014-07-08 DIAGNOSIS — Z853 Personal history of malignant neoplasm of breast: Secondary | ICD-10-CM | POA: Diagnosis not present

## 2014-07-08 DIAGNOSIS — C50912 Malignant neoplasm of unspecified site of left female breast: Secondary | ICD-10-CM

## 2014-07-08 DIAGNOSIS — E038 Other specified hypothyroidism: Secondary | ICD-10-CM

## 2014-07-08 DIAGNOSIS — Z923 Personal history of irradiation: Secondary | ICD-10-CM | POA: Insufficient documentation

## 2014-07-08 DIAGNOSIS — R911 Solitary pulmonary nodule: Secondary | ICD-10-CM | POA: Diagnosis not present

## 2014-07-08 DIAGNOSIS — R209 Unspecified disturbances of skin sensation: Secondary | ICD-10-CM | POA: Insufficient documentation

## 2014-07-08 DIAGNOSIS — I10 Essential (primary) hypertension: Secondary | ICD-10-CM | POA: Diagnosis not present

## 2014-07-08 DIAGNOSIS — Z79899 Other long term (current) drug therapy: Secondary | ICD-10-CM | POA: Diagnosis not present

## 2014-07-08 DIAGNOSIS — K219 Gastro-esophageal reflux disease without esophagitis: Secondary | ICD-10-CM | POA: Diagnosis not present

## 2014-07-08 DIAGNOSIS — E890 Postprocedural hypothyroidism: Secondary | ICD-10-CM | POA: Insufficient documentation

## 2014-07-08 DIAGNOSIS — E785 Hyperlipidemia, unspecified: Secondary | ICD-10-CM | POA: Diagnosis not present

## 2014-07-08 LAB — CBC WITH DIFFERENTIAL/PLATELET
Basophils Absolute: 0 10*3/uL (ref 0.0–0.1)
Basophils Relative: 0 % (ref 0–1)
Eosinophils Absolute: 0.2 10*3/uL (ref 0.0–0.7)
Eosinophils Relative: 2 % (ref 0–5)
HEMATOCRIT: 34.5 % — AB (ref 36.0–46.0)
Hemoglobin: 11.3 g/dL — ABNORMAL LOW (ref 12.0–15.0)
LYMPHS ABS: 2.5 10*3/uL (ref 0.7–4.0)
Lymphocytes Relative: 29 % (ref 12–46)
MCH: 29 pg (ref 26.0–34.0)
MCHC: 32.8 g/dL (ref 30.0–36.0)
MCV: 88.5 fL (ref 78.0–100.0)
MONOS PCT: 7 % (ref 3–12)
Monocytes Absolute: 0.6 10*3/uL (ref 0.1–1.0)
NEUTROS ABS: 5.2 10*3/uL (ref 1.7–7.7)
Neutrophils Relative %: 62 % (ref 43–77)
Platelets: 252 10*3/uL (ref 150–400)
RBC: 3.9 MIL/uL (ref 3.87–5.11)
RDW: 13.5 % (ref 11.5–15.5)
WBC: 8.4 10*3/uL (ref 4.0–10.5)

## 2014-07-08 LAB — COMPREHENSIVE METABOLIC PANEL
ALBUMIN: 3.6 g/dL (ref 3.5–5.2)
ALT: 13 U/L (ref 0–35)
AST: 19 U/L (ref 0–37)
Alkaline Phosphatase: 70 U/L (ref 39–117)
Anion gap: 11 (ref 5–15)
BILIRUBIN TOTAL: 0.3 mg/dL (ref 0.3–1.2)
BUN: 18 mg/dL (ref 6–23)
CHLORIDE: 104 meq/L (ref 96–112)
CO2: 25 mEq/L (ref 19–32)
CREATININE: 0.85 mg/dL (ref 0.50–1.10)
Calcium: 9.1 mg/dL (ref 8.4–10.5)
GFR calc Af Amer: 75 mL/min — ABNORMAL LOW (ref >90)
GFR calc non Af Amer: 64 mL/min — ABNORMAL LOW (ref >90)
Glucose, Bld: 90 mg/dL (ref 70–99)
Potassium: 4.3 mEq/L (ref 3.7–5.3)
Sodium: 140 mEq/L (ref 137–147)
TOTAL PROTEIN: 7.4 g/dL (ref 6.0–8.3)

## 2014-07-08 LAB — TSH: TSH: 3.43 u[IU]/mL (ref 0.350–4.500)

## 2014-07-08 LAB — PHOSPHORUS: PHOSPHORUS: 3.1 mg/dL (ref 2.3–4.6)

## 2014-07-08 NOTE — Progress Notes (Signed)
[place], [facility], MD [place] [address]  DIAGNOSIS: Breast cancer, left - Plan: CBC with Differential, Comprehensive metabolic panel, CBC with Differential, TSH, Comprehensive metabolic panel, PTH, intact and calcium, Phosphorus  Parathyroid adenoma - Plan: PTH, intact and calcium, Phosphorus  Other specified hypothyroidism - Plan: TSH  Cervical radiculopathy, chronic - Plan: MR Cervical Spine W Wo Contrast  Chief Complaint  Patient presents with  . hyperparathyroidism    surgical resection 12/03/2013  . Cervical radiculopathy    CURRENT THERAPY: Watchful expectation  INTERVAL HISTORY: [name] 79 y.o. female returns for followup of hyperparathyroidism, status post parathyroidectomy on 12/03/2013. She had complained of increasing numbness of the left upper extremity with radiation down the right upper extremity as well and was referred to [place] at the time of her last visit on 04/10/2014. Primary problem is bilateral upper extremity radiculopathy with worse weakness on the right. This resulted in an injury while trying to carry flower pot and is making the patient hesitant about continuing to drive.she was seen at [place] but an MRI was never performed because she needs to have it done by open technique. Bowel movements are regular with no nausea, vomiting, sore throat, diarrhea, constipation, melena, hematochezia, hematuria, incontinence, or lower extremity swelling or numbness.  MEDICAL HISTORY: Past Medical History  Diagnosis Date  . History of esophageal spasm   . Breast cancer 2003  . Syncope, non cardiac   . Osteoarthritis   . Diverticulitis     History  . Hypokalemia   . Osteoporosis 09/08/2011  . Dyslexia   . Infiltrating lobular carcinoma 04/04/2013  . HTN (hypertension)     takes Diovan daily  . Hyperlipidemia     takes Pravastatin daily  . Hypothyroidism    takes Synthroid daily  . GERD (gastroesophageal reflux disease)     takes Nexium daily  . PONV (postoperative nausea and vomiting)   . Headache(784.0)     all the time  . Neck pain     pinched   . Weakness     numbness left arm  . Joint pain   . Joint swelling   . Impaired hearing   . History of kidney stones   . H/O hiatal hernia   . History of IBS     no problem in past yr  . History of shingles     INTERIM HISTORY: has FULL INCONTINENCE OF FECES; DIARRHEA; Osteoporosis; Syncope and collapse; Chest pain; Dehydration; Acute renal failure; HTN (hypertension); Hypothyroid; GERD (gastroesophageal reflux disease); Infiltrating lobular carcinoma, left breast; Diverticulitis; Generalized weakness; Acute diverticulitis; Neck mass; and S/P parathyroidectomy on her problem list.    ALLERGIES:  is allergic to metronidazole; oxycodone; ciprofloxacin; and latex.  MEDICATIONS: has a current medication list which includes the following prescription(s): vitamin b-12, esomeprazole, levothyroxine, multiple vitamins-minerals, naproxen sodium, pravastatin, and valsartan.  SURGICAL HISTORY:  Past Surgical History  Procedure Laterality Date  . Thyroidectomy      Hypothyroidism status post  . Cholecystectomy  1997    Status post -stones  . Laparoscopic nissen fundoplication  9242  . Arm fx      left  . Breast lumpectomy      Left  . Knee surgery      bilateral  . Kidney stone surgery  1992  . Mandible reconstruction    . Cataract extraction    . Neck surgery  2000  [facility]  . Cataract extraction w/phaco  10/02/2011    Procedure: CATARACT EXTRACTION PHACO AND INTRAOCULAR LENS PLACEMENT (IOC);  Surgeon: [name], MD;  Location: [facility] ORS;  Service: Ophthalmology;  Laterality: Right;  CDE:17.01  . Kidney stent  06/04/11    removed after about 3 weeks.  . Lithotripsy  11/12  . Tubal ligation    . Wrist surgery Left 2009  . Shoulder surgery Left   . Nissen fundoplication    . Egd with  dilitation    . Colonoscopy    . Cardiac catheterization      x 3-last one in the 90's per pt  . Parathyroidectomy  12/03/2013    DR TEOH  . Thyroidectomy N/A 12/03/2013    Procedure: PARATHYROIDECTOMY;  Surgeon: [name], MD;  Location: [facility] OR;  Service: ENT;  Laterality: N/A;    FAMILY HISTORY: family history includes Macular degeneration in her sister. There is no history of Anesthesia problems, Hypotension, Pseudochol deficiency, or Malignant hyperthermia.  SOCIAL HISTORY:  reports that she has never smoked. She has never used smokeless tobacco. She reports that she does not drink alcohol or use illicit drugs.  REVIEW OF SYSTEMS:  Other than that discussed above is noncontributory.  PHYSICAL EXAMINATION: ECOG PERFORMANCE STATUS: 1 - Symptomatic but completely ambulatory  Blood pressure 165/74, pulse 59, temperature 98.9 F (37.2 C), temperature source Oral, resp. rate 16, weight 161 lb 6.4 oz (73.211 kg), SpO2 98 %.  GENERAL:alert, no distress and comfortable SKIN: skin color, texture, turgor are normal, no rashes or significant lesions EYES: PERLA; Conjunctiva are pink and non-injected, sclera clear SINUSES: No redness or tenderness over maxillary or ethmoid sinuses OROPHARYNX:no exudate, no erythema on lips, buccal mucosa, or tongue. NECK: supple, thyroid normal size, non-tender, without nodularity. No masses. Thyroid area surgical wound well-healed. Posterior neck surgical wound also well-healed. CHEST: no breast masses. Normal AP diameter. LYMPH:  no palpable lymphadenopathy in the cervical, axillary or inguinal LUNGS: clear to auscultation and percussion with normal breathing effort HEART: regular rate & rhythm and no murmurs. ABDOMEN:abdomen soft, non-tender and normal bowel sounds MUSCULOSKELETAL:no cyanosis of digits and no clubbing. Range of motion normal.  NEURO: alert & oriented x 3 with fluent speech, no focal motor/sensory deficits   LABORATORY DATA: Lab on  07/08/2014  Component Date Value Ref Range Status  . Sodium 07/08/2014 140  137 - 147 mEq/L Final  . Potassium 07/08/2014 4.3  3.7 - 5.3 mEq/L Final  . Chloride 07/08/2014 104  96 - 112 mEq/L Final  . CO2 07/08/2014 25  19 - 32 mEq/L Final  . Glucose, Bld 07/08/2014 90  70 - 99 mg/dL Final  . BUN 07/08/2014 18  6 - 23 mg/dL Final  . Creatinine, Ser 07/08/2014 0.85  0.50 - 1.10 mg/dL Final  . Calcium 07/08/2014 9.1  8.4 - 10.5 mg/dL Final  . Total Protein 07/08/2014 7.4  6.0 - 8.3 g/dL Final  . Albumin 07/08/2014 3.6  3.5 - 5.2 g/dL Final  . AST 07/08/2014 19  0 - 37 U/L Final  . ALT 07/08/2014 13  0 - 35 U/L Final  . Alkaline Phosphatase 07/08/2014 70  39 - 117 U/L Final  . Total Bilirubin 07/08/2014 0.3  0.3 - 1.2 mg/dL Final  . GFR calc non Af Amer 07/08/2014 64* >90 mL/min Final  . GFR calc Af Amer 07/08/2014 75* >90 mL/min Final   Comment: (NOTE) The eGFR has been calculated using the CKD EPI equation. This calculation  has not been validated in all clinical situations. eGFR's persistently <90 mL/min signify possible Chronic Kidney Disease.   . Anion gap 07/08/2014 11  5 - 15 Final  . Phosphorus 07/08/2014 3.1  2.3 - 4.6 mg/dL Final  . WBC 07/08/2014 8.4  4.0 - 10.5 K/uL Final  . RBC 07/08/2014 3.90  3.87 - 5.11 MIL/uL Final  . Hemoglobin 07/08/2014 11.3* 12.0 - 15.0 g/dL Final  . HCT 07/08/2014 34.5* 36.0 - 46.0 % Final  . MCV 07/08/2014 88.5  78.0 - 100.0 fL Final  . MCH 07/08/2014 29.0  26.0 - 34.0 pg Final  . MCHC 07/08/2014 32.8  30.0 - 36.0 g/dL Final  . RDW 07/08/2014 13.5  11.5 - 15.5 % Final  . Platelets 07/08/2014 252  150 - 400 K/uL Final  . Neutrophils Relative % 07/08/2014 62  43 - 77 % Final  . Neutro Abs 07/08/2014 5.2  1.7 - 7.7 K/uL Final  . Lymphocytes Relative 07/08/2014 29  12 - 46 % Final  . Lymphs Abs 07/08/2014 2.5  0.7 - 4.0 K/uL Final  . Monocytes Relative 07/08/2014 7  3 - 12 % Final  . Monocytes Absolute 07/08/2014 0.6  0.1 - 1.0 K/uL Final  .  Eosinophils Relative 07/08/2014 2  0 - 5 % Final  . Eosinophils Absolute 07/08/2014 0.2  0.0 - 0.7 K/uL Final  . Basophils Relative 07/08/2014 0  0 - 1 % Final  . Basophils Absolute 07/08/2014 0.0  0.0 - 0.1 K/uL Final  . WBC Morphology 07/08/2014 ATYPICAL LYMPHOCYTES   Final    PATHOLOGY:no new pathology.  Urinalysis    Component Value Date/Time   COLORURINE YELLOW 10/26/2013 0840   APPEARANCEUR HAZY* 10/26/2013 0840   LABSPEC 1.015 10/26/2013 0840   PHURINE 5.5 10/26/2013 0840   GLUCOSEU NEGATIVE 10/26/2013 0840   HGBUR LARGE* 10/26/2013 0840   BILIRUBINUR NEGATIVE 10/26/2013 0840   KETONESUR NEGATIVE 10/26/2013 0840   PROTEINUR TRACE* 10/26/2013 0840   UROBILINOGEN 0.2 10/26/2013 0840   NITRITE NEGATIVE 10/26/2013 0840   LEUKOCYTESUR MODERATE* 10/26/2013 0840    RADIOGRAPHIC STUDIES: No results found.  ASSESSMENT:  #1. Parathyroid adenoma, status post resection, normal PTH and calcium level now #2. Cervical radiculopathy, right worse than left. #3. History of lobular carcinoma left breast, no evidence of disease. #4. Osteoporosis, on yearly Reclast. #5. Left lingular lung nodule, inflammatory. #6. Hypothyroidism   PLAN:  #1. Open MRI cervical spine with and without contrast. #2. Zoledronic acid intravenously to be repeated again in January 2016. #3. Office visit 3 months with CBC, chem profile, PTH, phosphorus.   All questions were answered. The patient knows to call the clinic with any problems, questions or concerns. We can certainly see the patient much sooner if necessary.   I spent 30 minutes counseling the patient face to face. The total time spent in the appointment was 40 minutes.    Doroteo Bradford, MD 07/08/2014 2:01 PM  DISCLAIMER:  This note was dictated with voice recognition software.  Similar sounding words can inadvertently be transcribed inaccurately and may not be corrected upon review.   ribed inaccurately and may not be corrected upon  review.

## 2014-07-08 NOTE — Patient Instructions (Signed)
[place] Discharge Instructions  RECOMMENDATIONS MADE BY THE CONSULTANT AND ANY TEST RESULTS WILL BE SENT TO YOUR REFERRING PHYSICIAN.  EXAM FINDINGS BY THE PHYSICIAN TODAY AND SIGNS OR SYMPTOMS TO REPORT TO CLINIC OR PRIMARY PHYSICIAN: You saw Dr Barnet Glasgow today  MEDICATIONS PRESCRIBED:  No new medications  INSTRUCTIONS GIVEN AND DISCUSSED: We are going to schedule you an open MRI.   SPECIAL INSTRUCTIONS/FOLLOW-UP: Follow up in 3 months with the doctor and lab work.  Thank you for choosing [facility] to provide your oncology and hematology care.  To afford each patient quality time with our providers, please arrive at least 15 minutes before your scheduled appointment time.  With your help, our goal is to use those 15 minutes to complete the necessary work-up to ensure our physicians have the information they need to help with your evaluation and healthcare recommendations.    Effective January 1st, 2014, we ask that you re-schedule your appointment with our physicians should you arrive 10 or more minutes late for your appointment.  We strive to give you quality time with our providers, and arriving late affects you and other patients whose appointments are after yours.    Again, thank you for choosing [facility].  Our hope is that these requests will decrease the amount of time that you wait before being seen by our physicians.       _____________________________________________________________  Should you have questions after your visit to [facility], please contact our office at [phone] between the hours of 8:30 a.m. and 5:00 p.m.  Voicemails left after 4:30 p.m. will not be returned until the following business day.  For prescription refill requests, have your pharmacy contact our office with your prescription refill request.

## 2014-07-08 NOTE — Progress Notes (Signed)
LABS FOR CBCD,PTHI,TSH,PHOS

## 2014-07-09 LAB — PTH, INTACT AND CALCIUM
Calcium, Total (PTH): 8.8 mg/dL (ref 8.4–10.5)
PTH: 54 pg/mL (ref 14–64)

## 2014-07-15 ENCOUNTER — Other Ambulatory Visit (HOSPITAL_COMMUNITY): Payer: Self-pay | Admitting: Internal Medicine

## 2014-07-15 DIAGNOSIS — Z1231 Encounter for screening mammogram for malignant neoplasm of breast: Secondary | ICD-10-CM

## 2014-07-20 ENCOUNTER — Encounter (HOSPITAL_COMMUNITY): Payer: Self-pay | Admitting: Emergency Medicine

## 2014-07-20 ENCOUNTER — Ambulatory Visit
Admission: RE | Admit: 2014-07-20 | Discharge: 2014-07-20 | Disposition: A | Discharge status: Home / Self Care | Payer: Medicare Other | Source: Ambulatory Visit | Attending: Hematology and Oncology | Admitting: Hematology and Oncology

## 2014-07-20 ENCOUNTER — Emergency Department (HOSPITAL_COMMUNITY): Payer: Medicare Other

## 2014-07-20 ENCOUNTER — Emergency Department (HOSPITAL_COMMUNITY)
Admission: EM | Admit: 2014-07-20 | Discharge: 2014-07-20 | Disposition: A | Discharge status: Home / Self Care | Payer: Medicare Other | Attending: Emergency Medicine | Admitting: Emergency Medicine

## 2014-07-20 DIAGNOSIS — Z79899 Other long term (current) drug therapy: Secondary | ICD-10-CM | POA: Insufficient documentation

## 2014-07-20 DIAGNOSIS — R079 Chest pain, unspecified: Secondary | ICD-10-CM

## 2014-07-20 DIAGNOSIS — H9190 Unspecified hearing loss, unspecified ear: Secondary | ICD-10-CM | POA: Diagnosis not present

## 2014-07-20 DIAGNOSIS — I10 Essential (primary) hypertension: Secondary | ICD-10-CM | POA: Insufficient documentation

## 2014-07-20 DIAGNOSIS — R200 Anesthesia of skin: Secondary | ICD-10-CM

## 2014-07-20 DIAGNOSIS — E785 Hyperlipidemia, unspecified: Secondary | ICD-10-CM | POA: Diagnosis not present

## 2014-07-20 DIAGNOSIS — Z853 Personal history of malignant neoplasm of breast: Secondary | ICD-10-CM | POA: Diagnosis not present

## 2014-07-20 DIAGNOSIS — M5412 Radiculopathy, cervical region: Secondary | ICD-10-CM

## 2014-07-20 DIAGNOSIS — R0602 Shortness of breath: Secondary | ICD-10-CM | POA: Diagnosis not present

## 2014-07-20 DIAGNOSIS — K219 Gastro-esophageal reflux disease without esophagitis: Secondary | ICD-10-CM | POA: Insufficient documentation

## 2014-07-20 DIAGNOSIS — M1990 Unspecified osteoarthritis, unspecified site: Secondary | ICD-10-CM | POA: Insufficient documentation

## 2014-07-20 DIAGNOSIS — Z87442 Personal history of urinary calculi: Secondary | ICD-10-CM | POA: Diagnosis not present

## 2014-07-20 DIAGNOSIS — R202 Paresthesia of skin: Secondary | ICD-10-CM | POA: Diagnosis not present

## 2014-07-20 DIAGNOSIS — R0789 Other chest pain: Secondary | ICD-10-CM

## 2014-07-20 DIAGNOSIS — E039 Hypothyroidism, unspecified: Secondary | ICD-10-CM | POA: Insufficient documentation

## 2014-07-20 DIAGNOSIS — Z8619 Personal history of other infectious and parasitic diseases: Secondary | ICD-10-CM | POA: Diagnosis not present

## 2014-07-20 DIAGNOSIS — Z91040 Latex allergy status: Secondary | ICD-10-CM | POA: Diagnosis not present

## 2014-07-20 DIAGNOSIS — Z9889 Other specified postprocedural states: Secondary | ICD-10-CM | POA: Insufficient documentation

## 2014-07-20 LAB — BASIC METABOLIC PANEL
Anion gap: 11 (ref 5–15)
BUN: 19 mg/dL (ref 6–23)
CO2: 25 meq/L (ref 19–32)
Calcium: 8.8 mg/dL (ref 8.4–10.5)
Chloride: 104 mEq/L (ref 96–112)
Creatinine, Ser: 0.91 mg/dL (ref 0.50–1.10)
GFR calc non Af Amer: 59 mL/min — ABNORMAL LOW (ref >90)
GFR, EST AFRICAN AMERICAN: 69 mL/min — AB (ref >90)
Glucose, Bld: 85 mg/dL (ref 70–99)
POTASSIUM: 4.2 meq/L (ref 3.7–5.3)
SODIUM: 140 meq/L (ref 137–147)

## 2014-07-20 LAB — CBC
HCT: 34.2 % — ABNORMAL LOW (ref 36.0–46.0)
Hemoglobin: 10.9 g/dL — ABNORMAL LOW (ref 12.0–15.0)
MCH: 28.7 pg (ref 26.0–34.0)
MCHC: 31.9 g/dL (ref 30.0–36.0)
MCV: 90 fL (ref 78.0–100.0)
PLATELETS: 239 10*3/uL (ref 150–400)
RBC: 3.8 MIL/uL — AB (ref 3.87–5.11)
RDW: 13.3 % (ref 11.5–15.5)
WBC: 7.2 10*3/uL (ref 4.0–10.5)

## 2014-07-20 LAB — PRO B NATRIURETIC PEPTIDE: PRO B NATRI PEPTIDE: 86.3 pg/mL (ref 0–450)

## 2014-07-20 LAB — I-STAT TROPONIN, ED: Troponin i, poc: 0.02 ng/mL (ref 0.00–0.08)

## 2014-07-20 NOTE — Progress Notes (Addendum)
Pt presented to GI with c/o chest pain and being anxious.  BP 180/ 89, HR 78.  O 2 sat 97%.  Dr. Vernard Gambles in . EMS called.1798 aspirin 325 mg po subling NTG given.1004G sub ling given. EMS arrived.  1015 EMS took pt to [facility] ER

## 2014-07-20 NOTE — ED Notes (Addendum)
Patient transported to x-ray. ?

## 2014-07-20 NOTE — ED Notes (Addendum)
Pt from [facility] via [place] with c/o sudden onset left chest pain starting around 1030 am today.  Pt reports feeling a sharp pain followed by tightness.  Pt was going to have a MR Cervial Spine W WO contrast for her chronic left arm pain.  Pt reports having increased SOB walking in her driveway this past week.  Pt denies any other new symptoms.  Was given 325 mg aspirin, 2 nitro, and 125 mL NS by the facility, 1 additional nitro by EMS with no relief. EKG unremarkable.  Pt in NAD, A&O.

## 2014-07-20 NOTE — Progress Notes (Signed)
Pt presented to GI with c/o chest pain and being anxious.  BP 180/ 89, HR 78.  O 2 sat 97%.  Dr. Vernard Gambles in . EMS called.0757 aspirin 325 mg po subling NTG given.1004G sub ling given. EMS arrived.

## 2014-07-20 NOTE — ED Provider Notes (Signed)
CSN: [id number]     Arrival date & time 07/20/14  1027 History   First MD Initiated Contact with Patient 07/20/14 1058     Chief Complaint  Patient presents with  . Chest Pain     (Consider location/radiation/quality/duration/timing/severity/associated sxs/prior Treatment) Patient is a 79 y.o. female presenting with chest pain. The history is provided by the patient.  Chest Pain Associated symptoms: numbness   Associated symptoms: no abdominal pain, no back pain, no cough, no fever, no headache, no shortness of breath, not vomiting and no weakness   pt with hx breast cancer ('cured' x 12 yrs), htn, c/o transient sharp chest pain on way to have outpatient ct scan done this morning. Pt w hx ?ddd, prior neck surgery, who had developed numbness/pain in right arm in past couple months. pts MD had arranged outpt ct c spine today.  On way to appt, at rest, pt noted sharp pain mid chest lasting seconds. No associated sob, nv or diaphoresis. Denies any recent unusual doe or fatigue. No other recent cp or discomfort. No exertional cp or more prolonged chest pain. Denies cough or uri c/o. No heartburn. No fever or chills. Denies pleuritic pain. No leg pain or swelling.  States had two prior caths 10+ yrs ago, negative per pt.      Past Medical History  Diagnosis Date  . History of esophageal spasm   . Breast cancer 2003  . Syncope, non cardiac   . Osteoarthritis   . Diverticulitis     History  . Hypokalemia   . Osteoporosis 09/08/2011  . Dyslexia   . Infiltrating lobular carcinoma 04/04/2013  . HTN (hypertension)     takes Diovan daily  . Hyperlipidemia     takes Pravastatin daily  . Hypothyroidism     takes Synthroid daily  . GERD (gastroesophageal reflux disease)     takes Nexium daily  . PONV (postoperative nausea and vomiting)   . Headache(784.0)     all the time  . Neck pain     pinched   . Weakness     numbness left arm  . Joint pain   . Joint swelling   . Impaired hearing    . History of kidney stones   . H/O hiatal hernia   . History of IBS     no problem in past yr  . History of shingles    Past Surgical History  Procedure Laterality Date  . Thyroidectomy      Hypothyroidism status post  . Cholecystectomy  1997    Status post -stones  . Laparoscopic nissen fundoplication  8588  . Arm fx      left  . Breast lumpectomy      Left  . Knee surgery      bilateral  . Kidney stone surgery  1992  . Mandible reconstruction    . Cataract extraction    . Neck surgery  2000    [facility]  . Cataract extraction w/phaco  10/02/2011    Procedure: CATARACT EXTRACTION PHACO AND INTRAOCULAR LENS PLACEMENT (IOC);  Surgeon: [name], MD;  Location: [facility];  Service: Ophthalmology;  Laterality: Right;  CDE:17.01  . Kidney stent  06/04/11    removed after about 3 weeks.  . Lithotripsy  11/12  . Tubal ligation    . Wrist surgery Left 2009  . Shoulder surgery Left   . Nissen fundoplication    . Egd with dilitation    . Colonoscopy    .  Cardiac catheterization      x 3-last one in the 90's per pt  . Parathyroidectomy  12/03/2013    DR TEOH  . Thyroidectomy N/A 12/03/2013    Procedure: PARATHYROIDECTOMY;  Surgeon: [name], MD;  Location: [facility] OR;  Service: ENT;  Laterality: N/A;   Family History  Problem Relation Age of Onset  . Anesthesia problems Neg Hx   . Hypotension Neg Hx   . Pseudochol deficiency Neg Hx   . Malignant hyperthermia Neg Hx   . Macular degeneration Sister    History  Substance Use Topics  . Smoking status: Never Smoker   . Smokeless tobacco: Never Used  . Alcohol Use: No     Comment: occasionally   OB History    No data available     Review of Systems  Constitutional: Negative for fever and chills.  HENT: Negative for sore throat.   Eyes: Negative for redness.  Respiratory: Negative for cough and shortness of breath.   Cardiovascular: Positive for chest pain. Negative for leg swelling.  Gastrointestinal: Negative for vomiting,  abdominal pain and diarrhea.  Endocrine: Negative for polyuria.  Genitourinary: Negative for flank pain.  Musculoskeletal: Negative for back pain and neck pain.  Skin: Negative for rash.  Neurological: Positive for numbness. Negative for weakness and headaches.  Hematological: Does not bruise/bleed easily.  Psychiatric/Behavioral: Negative for confusion.      Allergies  Metronidazole; Oxycodone; Ciprofloxacin; and Latex  Home Medications   Prior to Admission medications   Medication Sig Start Date End Date Taking? Authorizing Provider  Cyanocobalamin (VITAMIN B-12) 2500 MCG SUBL Place 1 tablet under the tongue daily.    Historical Provider, MD  esomeprazole (NEXIUM) 40 MG capsule Take 40 mg by mouth daily.     Historical Provider, MD  levothyroxine (SYNTHROID, LEVOTHROID) 88 MCG tablet Take 88 mcg by mouth daily before breakfast.    Historical Provider, MD  Multiple Vitamins-Minerals (CENTRUM SILVER ADULT 50+ PO) Take 1 capsule by mouth daily.    Historical Provider, MD  Naproxen Sodium (ALEVE) 220 MG CAPS Take 220 mg by mouth as needed (pain).     Historical Provider, MD  pravastatin (PRAVACHOL) 20 MG tablet Take 20 mg by mouth daily.    Historical Provider, MD  valsartan (DIOVAN) 80 MG tablet Take 80 mg by mouth daily.    Historical Provider, MD   BP 133/70 mmHg  Pulse 64  Temp(Src) 98.2 F (36.8 C) (Oral)  Resp 11  Ht 5\' 6"  (1.676 m)  Wt 159 lb (72.122 kg)  BMI 25.68 kg/m2  SpO2 95% Physical Exam  Constitutional: She is oriented to person, place, and time. She appears well-developed and well-nourished. No distress.  HENT:  Mouth/Throat: Oropharynx is clear and moist.  Eyes: Conjunctivae are normal. No scleral icterus.  Neck: Normal range of motion. Neck supple. No tracheal deviation present.  Cardiovascular: Normal rate, regular rhythm, normal heart sounds and intact distal pulses.  Exam reveals no gallop and no friction rub.   No murmur heard. Pulmonary/Chest: Effort  normal and breath sounds normal. No respiratory distress. She exhibits no tenderness.  Abdominal: Soft. Normal appearance and bowel sounds are normal. She exhibits no distension and no mass. There is no tenderness. There is no rebound and no guarding.  Genitourinary:  No cva tenderness  Musculoskeletal: She exhibits no edema or tenderness.  No rue sts, erythema, or tenderness. Radial pulse 2+. Normal rom without pain. c spine nt. Well healed cervical surgical scar.  Neurological: She is alert and oriented to person, place, and time.  Motor intact bil.  Right upper exp bicep, trip, wrist flex/ext, finger abduction, and grip 5/5. sens light touch intact, although sub decreased.  Skin: Skin is warm and dry. No rash noted. She is not diaphoretic.  Psychiatric: She has a normal mood and affect.  Nursing note and vitals reviewed.   ED Course  Procedures (including critical care time) Labs Review  Results for orders placed or performed during the hospital encounter of 07/20/14  CBC  Result Value Ref Range   WBC 7.2 4.0 - 10.5 K/uL   RBC 3.80 (L) 3.87 - 5.11 MIL/uL   Hemoglobin 10.9 (L) 12.0 - 15.0 g/dL   HCT 34.2 (L) 36.0 - 46.0 %   MCV 90.0 78.0 - 100.0 fL   MCH 28.7 26.0 - 34.0 pg   MCHC 31.9 30.0 - 36.0 g/dL   RDW 13.3 11.5 - 15.5 %   Platelets 239 150 - 400 K/uL  Basic metabolic panel  Result Value Ref Range   Sodium 140 137 - 147 mEq/L   Potassium 4.2 3.7 - 5.3 mEq/L   Chloride 104 96 - 112 mEq/L   CO2 25 19 - 32 mEq/L   Glucose, Bld 85 70 - 99 mg/dL   BUN 19 6 - 23 mg/dL   Creatinine, Ser 0.91 0.50 - 1.10 mg/dL   Calcium 8.8 8.4 - 10.5 mg/dL   GFR calc non Af Amer 59 (L) >90 mL/min   GFR calc Af Amer 69 (L) >90 mL/min   Anion gap 11 5 - 15  BNP (order ONLY if patient complains of dyspnea/SOB AND you have documented it for THIS visit)  Result Value Ref Range   Pro B Natriuretic peptide (BNP) 86.3 0 - 450 pg/mL  I-stat troponin, ED (not at [facility])  Result Value Ref Range    Troponin i, poc 0.02 0.00 - 0.08 ng/mL   Comment 3           Dg Chest 2 View  07/20/2014   CLINICAL DATA:  79 year old female with sudden onset left chest pain at 1030 hrs. Increased shortness of breath this morning. Personal history of breast cancer.  EXAM: CHEST  2 VIEW  COMPARISON:  10/26/2013 and earlier.  FINDINGS: Stable postoperative changes to the left axilla and chest wall. Moderate hiatal hernia appears stable. Other cardiac and mediastinal contours are stable and within normal limits; tortuous descending thoracic aorta. No pneumothorax, pulmonary edema, pleural effusion or acute pulmonary opacity. Stable cholecystectomy clips. No acute osseous abnormality identified.  IMPRESSION: No acute cardiopulmonary abnormality.   Electronically Signed   By: Lars Pinks M.D.   On: 07/20/2014 13:29      Imaging Review No results found.   EKG Interpretation   Date/Time:  Monday July 20 2014 10:39:12 EST Ventricular Rate:  66 PR Interval:  186 QRS Duration: 91 QT Interval:  420 QTC Calculation: 440 R Axis:   -38 Text Interpretation:  Sinus rhythm Left axis deviation No significant  change since last tracing Confirmed by Ambria Mayfield  MD, Lennette Bihari ([id number]) on  07/20/2014 10:42:01 AM      MDM   Iv ns. Labs. Cxr. Ecg.  Pt asked if mri could be done while here in hospital so that she could get done and not have to return or reschedule - mri ordered.   Reviewed nursing notes and prior charts for additional history.   Recheck no chest pain or discomfort.  No sob.  Trop neg.  Pt refuses mri.  Offered to medicate, and re-try. Pt refuses mri, states she will re-arrange as outpatient.   Recheck pt declines MRI or further testing, and requests d/c.      Mirna Mires, MD 07/20/14 [phone]

## 2014-07-20 NOTE — ED Notes (Signed)
Patient transported to MRI 

## 2014-07-20 NOTE — Discharge Instructions (Signed)
It was our pleasure to provide your ER care today - we hope that you feel better.  Follow up with your doctor regarding right upper extremity paresthesias/tingling, and for re-scheduling of your MRI. For chest discomfort, follow up with cardiologist in the next 1-2 weeks - see referral - call to arrange appointment.  Return to ER if worse, new symptoms, recurrent or persistent chest pain, trouble breathing, fever, extremity numbness/weakness, other concern.     Chest Pain (Nonspecific) It is often hard to give a specific diagnosis for the cause of chest pain. There is always a chance that your pain could be related to something serious, such as a heart attack or a blood clot in the lungs. You need to follow up with your health care provider for further evaluation. CAUSES   Heartburn.  Pneumonia or bronchitis.  Anxiety or stress.  Inflammation around your heart (pericarditis) or lung (pleuritis or pleurisy).  A blood clot in the lung.  A collapsed lung (pneumothorax). It can develop suddenly on its own (spontaneous pneumothorax) or from trauma to the chest.  Shingles infection (herpes zoster virus). The chest wall is composed of bones, muscles, and cartilage. Any of these can be the source of the pain.  The bones can be bruised by injury.  The muscles or cartilage can be strained by coughing or overwork.  The cartilage can be affected by inflammation and become sore (costochondritis). DIAGNOSIS  Lab tests or other studies may be needed to find the cause of your pain. Your health care provider may have you take a test called an ambulatory electrocardiogram (ECG). An ECG records your heartbeat patterns over a 24-hour period. You may also have other tests, such as:  Transthoracic echocardiogram (TTE). During echocardiography, sound waves are used to evaluate how blood flows through your heart.  Transesophageal echocardiogram (TEE).  Cardiac monitoring. This allows your health  care provider to monitor your heart rate and rhythm in real time.  Holter monitor. This is a portable device that records your heartbeat and can help diagnose heart arrhythmias. It allows your health care provider to track your heart activity for several days, if needed.  Stress tests by exercise or by giving medicine that makes the heart beat faster. TREATMENT   Treatment depends on what may be causing your chest pain. Treatment may include:  Acid blockers for heartburn.  Anti-inflammatory medicine.  Pain medicine for inflammatory conditions.  Antibiotics if an infection is present.  You may be advised to change lifestyle habits. This includes stopping smoking and avoiding alcohol, caffeine, and chocolate.  You may be advised to keep your head raised (elevated) when sleeping. This reduces the chance of acid going backward from your stomach into your esophagus. Most of the time, nonspecific chest pain will improve within 2-3 days with rest and mild pain medicine.  HOME CARE INSTRUCTIONS   If antibiotics were prescribed, take them as directed. Finish them even if you start to feel better.  For the next few days, avoid physical activities that bring on chest pain. Continue physical activities as directed.  Do not use any tobacco products, including cigarettes, chewing tobacco, or electronic cigarettes.  Avoid drinking alcohol.  Only take medicine as directed by your health care provider.  Follow your health care provider's suggestions for further testing if your chest pain does not go away.  Keep any follow-up appointments you made. If you do not go to an appointment, you could develop lasting (chronic) problems with pain. If there is  any problem keeping an appointment, call to reschedule. SEEK MEDICAL CARE IF:   Your chest pain does not go away, even after treatment.  You have a rash with blisters on your chest.  You have a fever. SEEK IMMEDIATE MEDICAL CARE IF:   You have  increased chest pain or pain that spreads to your arm, neck, jaw, back, or abdomen.  You have shortness of breath.  You have an increasing cough, or you cough up blood.  You have severe back or abdominal pain.  You feel nauseous or vomit.  You have severe weakness.  You faint.  You have chills. This is an emergency. Do not wait to see if the pain will go away. Get medical help at once. Call your local emergency services (911 in [place] Do not drive yourself to the hospital. MAKE SURE YOU:   Understand these instructions.  Will watch your condition.  Will get help right away if you are not doing well or get worse. Document Released: 05/31/2005 Document Revised: 08/26/2013 Document Reviewed: 03/26/2008 [facility] Patient Information 2015 [place], [place]. This information is not intended to replace advice given to you by your health care provider. Make sure you discuss any questions you have with your health care provider.     Paresthesia Paresthesia is an abnormal burning or prickling sensation. This sensation is generally felt in the hands, arms, legs, or feet. However, it may occur in any part of the body. It is usually not painful. The feeling may be described as:  Tingling or numbness.  "Pins and needles."  Skin crawling.  Buzzing.  Limbs "falling asleep."  Itching. Most people experience temporary (transient) paresthesia at some time in their lives. CAUSES  Paresthesia may occur when you breathe too quickly (hyperventilation). It can also occur without any apparent cause. Commonly, paresthesia occurs when pressure is placed on a nerve. The feeling quickly goes away once the pressure is removed. For some people, however, paresthesia is a long-lasting (chronic) condition caused by an underlying disorder. The underlying disorder may be:  A traumatic, direct injury to nerves. Examples include a:  Broken (fractured) neck.  Fractured skull.  A disorder affecting the  brain and spinal cord (central nervous system). Examples include:  Transverse myelitis.  Encephalitis.  Transient ischemic attack.  Multiple sclerosis.  Stroke.  Tumor or blood vessel problems, such as an arteriovenous malformation pressing against the brain or spinal cord.  A condition that damages the peripheral nerves (peripheral neuropathy). Peripheral nerves are not part of the brain and spinal cord. These conditions include:  Diabetes.  Peripheral vascular disease.  Nerve entrapment syndromes, such as carpal tunnel syndrome.  Shingles.  Hypothyroidism.  Vitamin B12 deficiencies.  Alcoholism.  Heavy metal poisoning (lead, arsenic).  Rheumatoid arthritis.  Systemic lupus erythematosus. DIAGNOSIS  Your caregiver will attempt to find the underlying cause of your paresthesia. Your caregiver may:  Take your medical history.  Perform a physical exam.  Order various lab tests.  Order imaging tests. TREATMENT  Treatment for paresthesia depends on the underlying cause. HOME CARE INSTRUCTIONS  Avoid drinking alcohol.  You may consider massage or acupuncture to help relieve your symptoms.  Keep all follow-up appointments as directed by your caregiver. SEEK IMMEDIATE MEDICAL CARE IF:   You feel weak.  You have trouble walking or moving.  You have problems with speech or vision.  You feel confused.  You cannot control your bladder or bowel movements.  You feel numbness after an injury.  You faint.  Your burning or prickling feeling gets worse when walking.  You have pain, cramps, or dizziness.  You develop a rash. MAKE SURE YOU:  Understand these instructions.  Will watch your condition.  Will get help right away if you are not doing well or get worse. Document Released: 08/11/2002 Document Revised: 11/13/2011 Document Reviewed: 05/12/2011 [facility] Patient Information 2015 [place], [place]. This information is not intended to replace advice  given to you by your health care provider. Make sure you discuss any questions you have with your health care provider.

## 2014-07-27 ENCOUNTER — Ambulatory Visit (HOSPITAL_COMMUNITY)
Admission: RE | Admit: 2014-07-27 | Discharge: 2014-07-27 | Disposition: A | Discharge status: Home / Self Care | Payer: Medicare Other | Source: Ambulatory Visit | Attending: Internal Medicine | Admitting: Internal Medicine

## 2014-07-27 DIAGNOSIS — Z1231 Encounter for screening mammogram for malignant neoplasm of breast: Secondary | ICD-10-CM | POA: Insufficient documentation

## 2014-09-16 ENCOUNTER — Other Ambulatory Visit (HOSPITAL_COMMUNITY): Payer: Self-pay | Admitting: Hematology & Oncology

## 2014-09-17 ENCOUNTER — Encounter (HOSPITAL_COMMUNITY): Payer: Self-pay

## 2014-09-17 ENCOUNTER — Encounter (HOSPITAL_COMMUNITY): Payer: Medicare Other | Attending: Hematology and Oncology

## 2014-09-17 ENCOUNTER — Encounter (HOSPITAL_BASED_OUTPATIENT_CLINIC_OR_DEPARTMENT_OTHER): Payer: Medicare Other

## 2014-09-17 VITALS — BP 153/74 | HR 66 | Resp 18

## 2014-09-17 DIAGNOSIS — Z923 Personal history of irradiation: Secondary | ICD-10-CM | POA: Diagnosis not present

## 2014-09-17 DIAGNOSIS — E038 Other specified hypothyroidism: Secondary | ICD-10-CM | POA: Diagnosis not present

## 2014-09-17 DIAGNOSIS — K219 Gastro-esophageal reflux disease without esophagitis: Secondary | ICD-10-CM | POA: Insufficient documentation

## 2014-09-17 DIAGNOSIS — E785 Hyperlipidemia, unspecified: Secondary | ICD-10-CM | POA: Insufficient documentation

## 2014-09-17 DIAGNOSIS — Z79899 Other long term (current) drug therapy: Secondary | ICD-10-CM | POA: Diagnosis not present

## 2014-09-17 DIAGNOSIS — Z853 Personal history of malignant neoplasm of breast: Secondary | ICD-10-CM | POA: Diagnosis not present

## 2014-09-17 DIAGNOSIS — R209 Unspecified disturbances of skin sensation: Secondary | ICD-10-CM | POA: Diagnosis not present

## 2014-09-17 DIAGNOSIS — E890 Postprocedural hypothyroidism: Secondary | ICD-10-CM | POA: Insufficient documentation

## 2014-09-17 DIAGNOSIS — M5412 Radiculopathy, cervical region: Secondary | ICD-10-CM | POA: Diagnosis not present

## 2014-09-17 DIAGNOSIS — I10 Essential (primary) hypertension: Secondary | ICD-10-CM | POA: Diagnosis not present

## 2014-09-17 DIAGNOSIS — M810 Age-related osteoporosis without current pathological fracture: Secondary | ICD-10-CM | POA: Diagnosis not present

## 2014-09-17 DIAGNOSIS — R911 Solitary pulmonary nodule: Secondary | ICD-10-CM | POA: Insufficient documentation

## 2014-09-17 DIAGNOSIS — C50912 Malignant neoplasm of unspecified site of left female breast: Secondary | ICD-10-CM

## 2014-09-17 LAB — COMPREHENSIVE METABOLIC PANEL
ALT: 16 U/L (ref 0–35)
AST: 22 U/L (ref 0–37)
Albumin: 3.7 g/dL (ref 3.5–5.2)
Alkaline Phosphatase: 55 U/L (ref 39–117)
Anion gap: 5 (ref 5–15)
BUN: 16 mg/dL (ref 6–23)
CALCIUM: 8.6 mg/dL (ref 8.4–10.5)
CO2: 28 mmol/L (ref 19–32)
Chloride: 108 mEq/L (ref 96–112)
Creatinine, Ser: 0.76 mg/dL (ref 0.50–1.10)
GFR calc Af Amer: >90 mL/min (ref >90)
GFR, EST NON AFRICAN AMERICAN: 79 mL/min — AB (ref >90)
Glucose, Bld: 86 mg/dL (ref 70–99)
Potassium: 4.1 mmol/L (ref 3.5–5.1)
Sodium: 141 mmol/L (ref 135–145)
Total Bilirubin: 0.4 mg/dL (ref 0.3–1.2)
Total Protein: 6.8 g/dL (ref 6.0–8.3)

## 2014-09-17 LAB — CBC
HCT: 34.8 % — ABNORMAL LOW (ref 36.0–46.0)
HEMOGLOBIN: 11.4 g/dL — AB (ref 12.0–15.0)
MCH: 29.4 pg (ref 26.0–34.0)
MCHC: 32.8 g/dL (ref 30.0–36.0)
MCV: 89.7 fL (ref 78.0–100.0)
Platelets: 218 10*3/uL (ref 150–400)
RBC: 3.88 MIL/uL (ref 3.87–5.11)
RDW: 13.6 % (ref 11.5–15.5)
WBC: 7.4 10*3/uL (ref 4.0–10.5)

## 2014-09-17 MED ORDER — DENOSUMAB 60 MG/ML ~~LOC~~ SOLN
60.0000 mg | Freq: Once | SUBCUTANEOUS | Status: AC
  Administered 2014-09-17: 60 mg via SUBCUTANEOUS
  Filled 2014-09-17: qty 1

## 2014-09-17 NOTE — Progress Notes (Signed)
[name]'s reason for visit today is for an injection and labs as scheduled per MD orders.  Labs were drawn prior to administration of ordered medication. [name] also received prolia per MD orders; see [facility] for administration details.  [name] tolerated all procedures well and without incident; questions were answered and patient was discharged.   Education given on prolia.  Told to notify us of any dental procedures other than dental cleaning, and to start taking calcium 1200 mg and vitamin d 1000 mg.

## 2014-09-17 NOTE — Patient Instructions (Signed)
[place] at [facility]  Discharge Instructions:  You received a prolia injection today.  Follow up as scheduled.  Call the clinic if you have any questions or concerns.  Educated on prolia.  Told to take calcium 1200 mg and vitamin d 1000 mg daily.  To notify the clinic before any dental procedures other than cleaning. _______________________________________________________________  Thank you for choosing [place] at [facility] to provide your oncology and hematology care.  To afford each patient quality time with our providers, please arrive at least 15 minutes before your scheduled appointment.  You need to re-schedule your appointment if you arrive 10 or more minutes late.  We strive to give you quality time with our providers, and arriving late affects you and other patients whose appointments are after yours.  Also, if you no show three or more times for appointments you may be dismissed from the clinic.  Again, thank you for choosing [place] at [place] hope is that these requests will allow you access to exceptional care and in a timely manner. _______________________________________________________________  If you have questions after your visit, please contact our office at [phone] between the hours of 8:30 a.m. and 5:00 p.m. Voicemails left after 4:30 p.m. will not be returned until the following business day. _______________________________________________________________  For prescription refill requests, have your pharmacy contact our office. _______________________________________________________________  Recommendations made by the consultant and any test results will be sent to your referring physician. _______________________________________________________________

## 2014-09-17 NOTE — Progress Notes (Signed)
Labs for vd25,cbc,cmp

## 2014-09-18 LAB — VITAMIN D 25 HYDROXY (VIT D DEFICIENCY, FRACTURES): Vit D, 25-Hydroxy: 30.5 ng/mL (ref 30.0–100.0)

## 2014-10-08 ENCOUNTER — Ambulatory Visit (HOSPITAL_COMMUNITY): Payer: Medicare Other | Admitting: Hematology & Oncology

## 2014-10-08 ENCOUNTER — Other Ambulatory Visit (HOSPITAL_COMMUNITY): Payer: Medicare Other

## 2014-10-08 DIAGNOSIS — Z9189 Other specified personal risk factors, not elsewhere classified: Secondary | ICD-10-CM | POA: Diagnosis not present

## 2014-10-08 DIAGNOSIS — E782 Mixed hyperlipidemia: Secondary | ICD-10-CM | POA: Diagnosis not present

## 2014-10-08 DIAGNOSIS — I10 Essential (primary) hypertension: Secondary | ICD-10-CM | POA: Diagnosis not present

## 2014-10-08 DIAGNOSIS — K210 Gastro-esophageal reflux disease with esophagitis: Secondary | ICD-10-CM | POA: Diagnosis not present

## 2014-10-08 DIAGNOSIS — M810 Age-related osteoporosis without current pathological fracture: Secondary | ICD-10-CM | POA: Diagnosis not present

## 2014-10-08 DIAGNOSIS — Z23 Encounter for immunization: Secondary | ICD-10-CM | POA: Diagnosis not present

## 2014-10-08 DIAGNOSIS — E039 Hypothyroidism, unspecified: Secondary | ICD-10-CM | POA: Diagnosis not present

## 2014-10-08 DIAGNOSIS — Z1389 Encounter for screening for other disorder: Secondary | ICD-10-CM | POA: Diagnosis not present

## 2014-10-08 DIAGNOSIS — M170 Bilateral primary osteoarthritis of knee: Secondary | ICD-10-CM | POA: Diagnosis not present

## 2014-10-09 ENCOUNTER — Other Ambulatory Visit (HOSPITAL_COMMUNITY): Payer: Medicare Other

## 2014-10-09 ENCOUNTER — Ambulatory Visit (HOSPITAL_COMMUNITY): Payer: Medicare Other | Admitting: Hematology & Oncology

## 2014-10-22 ENCOUNTER — Encounter (HOSPITAL_COMMUNITY): Payer: Medicare Other | Attending: Hematology & Oncology

## 2014-10-22 DIAGNOSIS — M810 Age-related osteoporosis without current pathological fracture: Secondary | ICD-10-CM | POA: Diagnosis not present

## 2014-10-22 DIAGNOSIS — R209 Unspecified disturbances of skin sensation: Secondary | ICD-10-CM | POA: Diagnosis not present

## 2014-10-22 DIAGNOSIS — M5412 Radiculopathy, cervical region: Secondary | ICD-10-CM | POA: Diagnosis not present

## 2014-10-22 DIAGNOSIS — Z79899 Other long term (current) drug therapy: Secondary | ICD-10-CM | POA: Diagnosis not present

## 2014-10-22 DIAGNOSIS — K219 Gastro-esophageal reflux disease without esophagitis: Secondary | ICD-10-CM | POA: Diagnosis not present

## 2014-10-22 DIAGNOSIS — R911 Solitary pulmonary nodule: Secondary | ICD-10-CM | POA: Insufficient documentation

## 2014-10-22 DIAGNOSIS — E785 Hyperlipidemia, unspecified: Secondary | ICD-10-CM | POA: Insufficient documentation

## 2014-10-22 DIAGNOSIS — Z853 Personal history of malignant neoplasm of breast: Secondary | ICD-10-CM | POA: Insufficient documentation

## 2014-10-22 DIAGNOSIS — E890 Postprocedural hypothyroidism: Secondary | ICD-10-CM | POA: Diagnosis not present

## 2014-10-22 DIAGNOSIS — I10 Essential (primary) hypertension: Secondary | ICD-10-CM | POA: Diagnosis not present

## 2014-10-22 DIAGNOSIS — Z923 Personal history of irradiation: Secondary | ICD-10-CM | POA: Insufficient documentation

## 2014-10-22 DIAGNOSIS — C50912 Malignant neoplasm of unspecified site of left female breast: Secondary | ICD-10-CM

## 2014-10-22 LAB — CBC WITH DIFFERENTIAL/PLATELET
Basophils Absolute: 0 10*3/uL (ref 0.0–0.1)
Basophils Relative: 0 % (ref 0–1)
Eosinophils Absolute: 0.2 10*3/uL (ref 0.0–0.7)
Eosinophils Relative: 3 % (ref 0–5)
HCT: 36.6 % (ref 36.0–46.0)
Hemoglobin: 11.7 g/dL — ABNORMAL LOW (ref 12.0–15.0)
Lymphocytes Relative: 36 % (ref 12–46)
Lymphs Abs: 2.6 10*3/uL (ref 0.7–4.0)
MCH: 28.7 pg (ref 26.0–34.0)
MCHC: 32 g/dL (ref 30.0–36.0)
MCV: 89.9 fL (ref 78.0–100.0)
Monocytes Absolute: 0.5 10*3/uL (ref 0.1–1.0)
Monocytes Relative: 7 % (ref 3–12)
NEUTROS PCT: 54 % (ref 43–77)
Neutro Abs: 4 10*3/uL (ref 1.7–7.7)
PLATELETS: 217 10*3/uL (ref 150–400)
RBC: 4.07 MIL/uL (ref 3.87–5.11)
RDW: 13.7 % (ref 11.5–15.5)
WBC: 7.4 10*3/uL (ref 4.0–10.5)

## 2014-10-22 LAB — COMPREHENSIVE METABOLIC PANEL
ALBUMIN: 3.8 g/dL (ref 3.5–5.2)
ALT: 17 U/L (ref 0–35)
AST: 23 U/L (ref 0–37)
Alkaline Phosphatase: 48 U/L (ref 39–117)
Anion gap: 3 — ABNORMAL LOW (ref 5–15)
BILIRUBIN TOTAL: 0.4 mg/dL (ref 0.3–1.2)
BUN: 17 mg/dL (ref 6–23)
CHLORIDE: 109 mmol/L (ref 96–112)
CO2: 27 mmol/L (ref 19–32)
Calcium: 8.6 mg/dL (ref 8.4–10.5)
Creatinine, Ser: 0.75 mg/dL (ref 0.50–1.10)
GFR calc non Af Amer: 80 mL/min — ABNORMAL LOW (ref >90)
GLUCOSE: 86 mg/dL (ref 70–99)
Potassium: 3.9 mmol/L (ref 3.5–5.1)
SODIUM: 139 mmol/L (ref 135–145)
Total Protein: 7.1 g/dL (ref 6.0–8.3)

## 2014-10-22 LAB — PHOSPHORUS: Phosphorus: 3 mg/dL (ref 2.3–4.6)

## 2014-10-22 LAB — TSH: TSH: 3.501 u[IU]/mL (ref 0.350–4.500)

## 2014-10-22 NOTE — Progress Notes (Signed)
LABS FOR TSH,PTHI,PHOS,CMP,CBCD

## 2014-10-23 LAB — PTH, INTACT AND CALCIUM
Calcium, Total (PTH): 8.7 mg/dL (ref 8.7–10.3)
PTH: 25 pg/mL (ref 15–65)

## 2014-10-27 ENCOUNTER — Encounter (HOSPITAL_COMMUNITY): Payer: Self-pay | Admitting: Hematology & Oncology

## 2014-10-27 ENCOUNTER — Encounter (HOSPITAL_BASED_OUTPATIENT_CLINIC_OR_DEPARTMENT_OTHER): Payer: Medicare Other | Admitting: Hematology & Oncology

## 2014-10-27 ENCOUNTER — Ambulatory Visit (HOSPITAL_COMMUNITY)
Admission: RE | Admit: 2014-10-27 | Discharge: 2014-10-27 | Disposition: A | Discharge status: Home / Self Care | Payer: Medicare Other | Source: Ambulatory Visit | Attending: Hematology & Oncology | Admitting: Hematology & Oncology

## 2014-10-27 VITALS — BP 163/71 | HR 66 | Temp 98.2°F | Resp 16 | Wt 161.6 lb

## 2014-10-27 DIAGNOSIS — K5732 Diverticulitis of large intestine without perforation or abscess without bleeding: Secondary | ICD-10-CM | POA: Diagnosis not present

## 2014-10-27 DIAGNOSIS — N200 Calculus of kidney: Secondary | ICD-10-CM

## 2014-10-27 DIAGNOSIS — R300 Dysuria: Secondary | ICD-10-CM

## 2014-10-27 DIAGNOSIS — R911 Solitary pulmonary nodule: Secondary | ICD-10-CM | POA: Diagnosis not present

## 2014-10-27 DIAGNOSIS — M810 Age-related osteoporosis without current pathological fracture: Secondary | ICD-10-CM

## 2014-10-27 DIAGNOSIS — K219 Gastro-esophageal reflux disease without esophagitis: Secondary | ICD-10-CM | POA: Diagnosis not present

## 2014-10-27 DIAGNOSIS — K5792 Diverticulitis of intestine, part unspecified, without perforation or abscess without bleeding: Secondary | ICD-10-CM | POA: Insufficient documentation

## 2014-10-27 DIAGNOSIS — Z853 Personal history of malignant neoplasm of breast: Secondary | ICD-10-CM

## 2014-10-27 DIAGNOSIS — E890 Postprocedural hypothyroidism: Secondary | ICD-10-CM | POA: Diagnosis not present

## 2014-10-27 DIAGNOSIS — R109 Unspecified abdominal pain: Secondary | ICD-10-CM

## 2014-10-27 DIAGNOSIS — R209 Unspecified disturbances of skin sensation: Secondary | ICD-10-CM | POA: Diagnosis not present

## 2014-10-27 DIAGNOSIS — M5412 Radiculopathy, cervical region: Secondary | ICD-10-CM | POA: Diagnosis not present

## 2014-10-27 LAB — URINALYSIS, ROUTINE W REFLEX MICROSCOPIC
Bilirubin Urine: NEGATIVE
Glucose, UA: NEGATIVE mg/dL
Ketones, ur: NEGATIVE mg/dL
Nitrite: POSITIVE — AB
PH: 6 (ref 5.0–8.0)
Protein, ur: NEGATIVE mg/dL
SPECIFIC GRAVITY, URINE: 1.02 (ref 1.005–1.030)
Urobilinogen, UA: 0.2 mg/dL (ref 0.0–1.0)

## 2014-10-27 LAB — URINE MICROSCOPIC-ADD ON

## 2014-10-27 NOTE — Patient Instructions (Addendum)
[place] at [facility] Discharge Instructions  RECOMMENDATIONS MADE BY THE CONSULTANT AND ANY TEST RESULTS WILL BE SENT TO YOUR REFERRING PHYSICIAN.  Exam and discussion by Dr. Whitney Muse.  We will get Renal CT to see if you have a kidney stone that is causing your pain.  Will also check urine to see if there's an infection.  Follow-up as scheduled for your labs & prolia  Follow-up in 1 year to see MD.  Thank you for choosing [place] at [facility] to provide your oncology and hematology care.  To afford each patient quality time with our provider, please arrive at least 15 minutes before your scheduled appointment time.    You need to re-schedule your appointment should you arrive 10 or more minutes late.  We strive to give you quality time with our providers, and arriving late affects you and other patients whose appointments are after yours.  Also, if you no show three or more times for appointments you may be dismissed from the clinic at the providers discretion.     Again, thank you for choosing [facility].  Our hope is that these requests will decrease the amount of time that you wait before being seen by our physicians.       _____________________________________________________________  Should you have questions after your visit to [facility], please contact our office at [phone] between the hours of 8:30 a.m. and 4:30 p.m.  Voicemails left after 4:30 p.m. will not be returned until the following business day.  For prescription refill requests, have your pharmacy contact our office.

## 2014-10-29 ENCOUNTER — Other Ambulatory Visit (HOSPITAL_COMMUNITY): Payer: Self-pay | Admitting: Hematology & Oncology

## 2014-10-29 ENCOUNTER — Telehealth (HOSPITAL_COMMUNITY): Payer: Self-pay

## 2014-10-29 MED ORDER — NITROFURANTOIN MONOHYD MACRO 100 MG PO CAPS: 100.0000 mg | ORAL_CAPSULE | Freq: Two times a day (BID) | ORAL | Status: DC

## 2014-10-29 NOTE — Telephone Encounter (Signed)
Wants to know test results and whether or not she has kidney stones or kidney infection.

## 2014-11-17 NOTE — Assessment & Plan Note (Addendum)
Continue prolia every 6 months. Last DEXA was in 04/2014. She will be due again for repeat DEXA in 04/2016. She takes Centrum silver only. She refuses to take calcium and vitamin D because of kidney stones. She has a history of elevated calcium in the past, PTH values have been checked and normal.

## 2014-11-17 NOTE — Assessment & Plan Note (Signed)
Stage II A. (T2, N0, M0) infiltrating lobular carcinoma left breast diagnosed in 2003 status post partial mastectomy on 02/25/2002 with a 3.0 x 3.5 cm primary ER +99% PR +80% HER-2/neu nonamplified and Ki-67 marker was low at 10% surgery with negative sentinel nodes felt to be well-differentiated status post radiation therapy followed by Arimidex initially which she could not tolerate followed by letrozole and she completed 5 full years of adjuvant hormonal therapy.   She has no obvious evidence of recurrence.  She is up to date with mammography.  She is not interested in breast exam today because she is principally concerned with flank pain. I emphasized the importance of at least yearly breast exam with one of her physicians. Plan on seeing her back again in one year with breast exam.

## 2014-11-17 NOTE — Progress Notes (Signed)
Belinda Cahill, MD [place]  [id number]  Stage IIA, infiltrating lobular carcinoma diagnosed in 2003 Partial mastectomy 02/25/2002 ER+ PR+ Her-2 neu negative Adjuvant XRT Completed 5 years of AI therapy  CURRENT THERAPY: NCCN guidelines for surveillance.  Reclast annually for osteoporosis (last given on 09/27/2012).  INTERVAL HISTORY: [name] 80 y.o. female returns for  regular  visit for followup of stage II A. (T2, N0, M0) infiltrating lobular carcinoma left breast diagnosed in 2003 status post partial mastectomy on 02/25/2002 with a 3.0 x 3.5 cm primary ER +99% PR +80% HER-2/neu nonamplified and Ki-67 marker was low at 10% surgery with negative sentinel nodes felt to be well-differentiated status post radiation therapy followed by Arimidex initially, which she could not tolerate, followed by letrozole and she completed 5 full years of adjuvant hormonal therapy.  She is up-to-date on screening mammography with her last mammogram being done in November of last year. She complains of low energy and states this has been getting progressively worse over the last several months. She continues to struggle with kidney stones, this is also a chronic issue. She is currently concerned that she is trying to pass a stone and may be unable to. She has severe pain in the right flank that started approximately a week ago and is not improving.  She has a history of an abnormal CT of the chest with a repeat study in January of last year with findings felt to be atypical infection, inflammation, or scarring.  Past Medical History  Diagnosis Date  . History of esophageal spasm   . Breast cancer 2003  . Syncope, non cardiac   . Osteoarthritis   . Diverticulitis     History  . Hypokalemia   . Osteoporosis 09/08/2011  . Dyslexia   . Infiltrating lobular carcinoma 04/04/2013  . HTN (hypertension)     takes Diovan daily  . Hyperlipidemia     takes Pravastatin daily  . Hypothyroidism     takes  Synthroid daily  . GERD (gastroesophageal reflux disease)     takes Nexium daily  . PONV (postoperative nausea and vomiting)   . Headache(784.0)     all the time  . Neck pain     pinched   . Weakness     numbness left arm  . Joint pain   . Joint swelling   . Impaired hearing   . History of kidney stones   . H/O hiatal hernia   . History of IBS     no problem in past yr  . History of shingles     has FULL INCONTINENCE OF FECES; DIARRHEA; Osteoporosis; Syncope and collapse; Chest pain; Dehydration; Acute renal failure; HTN (hypertension); Hypothyroid; GERD (gastroesophageal reflux disease); Infiltrating lobular carcinoma, left breast; Diverticulitis; Generalized weakness; Acute diverticulitis; Neck mass; and S/P parathyroidectomy on her problem list.     is allergic to metronidazole; oxycodone; ciprofloxacin; and latex.  Ms. Runions does not currently have medications on file.  Past Surgical History  Procedure Laterality Date  . Thyroidectomy      Hypothyroidism status post  . Cholecystectomy  1997    Status post -stones  . Laparoscopic nissen fundoplication  0760  . Arm fx      left  . Breast lumpectomy      Left  . Knee surgery      bilateral  . Kidney stone surgery  1992  . Mandible reconstruction    . Cataract extraction    . Neck  surgery  2000    [name]  . Cataract extraction w/phaco  10/02/2011    Procedure: CATARACT EXTRACTION PHACO AND INTRAOCULAR LENS PLACEMENT (IOC);  Surgeon: [name], MD;  Location: [facility];  Service: Ophthalmology;  Laterality: Right;  CDE:17.01  . Kidney stent  06/04/11    removed after about 3 weeks.  . Lithotripsy  11/12  . Tubal ligation    . Wrist surgery Left 2009  . Shoulder surgery Left   . Nissen fundoplication    . Egd with dilitation    . Colonoscopy    . Cardiac catheterization      x 3-last one in the 90's per pt  . Parathyroidectomy  12/03/2013    DR TEOH  . Thyroidectomy N/A 12/03/2013    Procedure: PARATHYROIDECTOMY;   Surgeon: [name], MD;  Location[facility];  Service: ENT;  Laterality: N/A;    Denies any headaches, dizziness, double vision, fevers, chills, night sweats, nausea, vomiting, diarrhea, constipation, chest pain, heart palpitations, shortness of breath, blood in stool, black tarry stool, urinary pain, urinary burning, urinary frequency, hematuria.   PHYSICAL EXAMINATION  ECOG PERFORMANCE STATUS: 1 - Symptomatic but completely ambulatory  Filed Vitals:   10/27/14 1500  BP: 163/71  Pulse: 66  Temp: 98.2 F (36.8 C)  Resp: 16    GENERAL:alert, no distress, well nourished, well developed, comfortable, cooperative and smiling SKIN: skin color, texture, turgor are normal, no rashes or significant lesions HEAD: Normocephalic, No masses, lesions, tenderness or abnormalities EYES: normal, PERRLA, EOMI, Conjunctiva are pink and non-injected EARS: External ears normal OROPHARYNX:mucous membranes are moist  NECK: supple, trachea midline LYMPH:  not examined BREAST:patient declines to have breast exam LUNGS: clear to auscultation  HEART: regular rate & rhythm, no gallops and systolic ejection murmur heard throughout the precordium ABDOMEN:abdomen soft, non-tender and normal bowel sounds BACK: Back symmetric, no curvature.  EXTREMITIES:less then 2 second capillary refill, no joint deformities, effusion, or inflammation, no edema, no skin discoloration, no clubbing, no cyanosis  NEURO: alert & oriented x 3 with fluent speech, no focal motor/sensory deficits, gait normal    LABORATORY DATA: CBC    Component Value Date/Time   WBC 7.4 10/22/2014 1003   RBC 4.07 10/22/2014 1003   HGB 11.7* 10/22/2014 1003   HCT 36.6 10/22/2014 1003   PLT 217 10/22/2014 1003   MCV 89.9 10/22/2014 1003   MCH 28.7 10/22/2014 1003   MCHC 32.0 10/22/2014 1003   RDW 13.7 10/22/2014 1003   LYMPHSABS 2.6 10/22/2014 1003   MONOABS 0.5 10/22/2014 1003   EOSABS 0.2 10/22/2014 1003   BASOSABS 0.0 10/22/2014 1003        Chemistry      Component Value Date/Time   NA 139 10/22/2014 1003   K 3.9 10/22/2014 1003   CL 109 10/22/2014 1003   CO2 27 10/22/2014 1003   BUN 17 10/22/2014 1003   CREATININE 0.75 10/22/2014 1003      Component Value Date/Time   CALCIUM 8.6 10/22/2014 1003   CALCIUM 8.7 10/22/2014 1003   ALKPHOS 48 10/22/2014 1003   AST 23 10/22/2014 1003   ALT 17 10/22/2014 1003   BILITOT 0.4 10/22/2014 1003       Patient Active Problem List   Diagnosis Date Noted  . S/P parathyroidectomy 12/03/2013  . Neck mass 10/30/2013  . Diverticulitis 10/26/2013  . Generalized weakness 10/26/2013  . Acute diverticulitis 10/26/2013  . Infiltrating lobular carcinoma, left breast 04/04/2013  . Syncope and collapse 02/21/2012  .  Chest pain 02/21/2012  . Dehydration 02/21/2012  . Acute renal failure 02/21/2012  . HTN (hypertension) 02/21/2012  . Hypothyroid 02/21/2012  . GERD (gastroesophageal reflux disease) 02/21/2012  . Osteoporosis 09/08/2011  . FULL INCONTINENCE OF FECES 11/07/2010  . DIARRHEA 11/07/2010      ASSESSMENT/PLAN:  Infiltrating lobular carcinoma, left breast Stage II A. (T2, N0, M0) infiltrating lobular carcinoma left breast diagnosed in 2003 status post partial mastectomy on 02/25/2002 with a 3.0 x 3.5 cm primary ER +99% PR +80% HER-2/neu nonamplified and Ki-67 marker was low at 10% surgery with negative sentinel nodes felt to be well-differentiated status post radiation therapy followed by Arimidex initially which she could not tolerate followed by letrozole and she completed 5 full years of adjuvant hormonal therapy.   She has no obvious evidence of recurrence.  She is up to date with mammography.  She is not interested in breast exam today because she is principally concerned with flank pain. I emphasized the importance of at least yearly breast exam with one of her physicians. Plan on seeing her back again in one year with breast exam.    Osteoporosis Continue  prolia every 6 months. Last DEXA was in 04/2014. She will be due again for repeat DEXA in 04/2016. She takes Centrum silver only. She refuses to take calcium and vitamin D because of kidney stones. She has a history of elevated calcium in the past, PTH values have been checked and normal.    Kidney stones  Because she has a history of kidney stones and is having ongoing and persistent flank pain I have recommended a renal protocol CT. Advised the patient I will call her when the results are back. There is any concern she may not be able to pass a kidney stone we will get her to her urologist. We will also check a U/A and notify her of the results.    THERAPY PLAN:  NCCN guidelines recommends the following surveillance for invasive breast cancer:  A. History and Physical exam every 4-6 months for 5 years and then every 12 months.  B. Mammography every 12 months  C. Women on Tamoxifen: annual gynecologic assessment every 12 months if uterus is present.  D. Women on aromatase inhibitor or who experience ovarian failure secondary to treatment should have monitoring of bone health with a bone mineral density determination at baseline and periodically thereafter.  E. Assess and encourage adherence to adjuvant endocrine therapy.  F. Evidence suggests that active lifestyle and achieving and maintaining an ideal body weight (20-25 BMI) may lead to optimal breast cancer outcomes.   All questions were answered. The patient knows to call the clinic with any problems, questions or concerns. We can certainly see the patient much sooner if necessary. This note was signed electronically.  Molli Hazard MD

## 2015-01-06 DIAGNOSIS — M79672 Pain in left foot: Secondary | ICD-10-CM | POA: Diagnosis not present

## 2015-01-06 DIAGNOSIS — M25532 Pain in left wrist: Secondary | ICD-10-CM | POA: Diagnosis not present

## 2015-03-05 DIAGNOSIS — I10 Essential (primary) hypertension: Secondary | ICD-10-CM | POA: Diagnosis not present

## 2015-03-05 DIAGNOSIS — E039 Hypothyroidism, unspecified: Secondary | ICD-10-CM | POA: Diagnosis not present

## 2015-03-05 DIAGNOSIS — M170 Bilateral primary osteoarthritis of knee: Secondary | ICD-10-CM | POA: Diagnosis not present

## 2015-03-05 DIAGNOSIS — E782 Mixed hyperlipidemia: Secondary | ICD-10-CM | POA: Diagnosis not present

## 2015-03-05 DIAGNOSIS — K210 Gastro-esophageal reflux disease with esophagitis: Secondary | ICD-10-CM | POA: Diagnosis not present

## 2015-03-12 ENCOUNTER — Other Ambulatory Visit (HOSPITAL_COMMUNITY): Payer: Self-pay

## 2015-03-12 DIAGNOSIS — M170 Bilateral primary osteoarthritis of knee: Secondary | ICD-10-CM | POA: Diagnosis not present

## 2015-03-12 DIAGNOSIS — K219 Gastro-esophageal reflux disease without esophagitis: Secondary | ICD-10-CM | POA: Diagnosis not present

## 2015-03-12 DIAGNOSIS — E039 Hypothyroidism, unspecified: Secondary | ICD-10-CM | POA: Diagnosis not present

## 2015-03-12 DIAGNOSIS — S161XXA Strain of muscle, fascia and tendon at neck level, initial encounter: Secondary | ICD-10-CM | POA: Diagnosis not present

## 2015-03-12 DIAGNOSIS — M810 Age-related osteoporosis without current pathological fracture: Secondary | ICD-10-CM

## 2015-03-12 DIAGNOSIS — E782 Mixed hyperlipidemia: Secondary | ICD-10-CM | POA: Diagnosis not present

## 2015-03-12 DIAGNOSIS — I10 Essential (primary) hypertension: Secondary | ICD-10-CM | POA: Diagnosis not present

## 2015-03-18 ENCOUNTER — Encounter (HOSPITAL_COMMUNITY): Payer: Medicare Other | Attending: Hematology & Oncology

## 2015-03-18 ENCOUNTER — Encounter (HOSPITAL_COMMUNITY): Payer: Medicare Other

## 2015-03-18 ENCOUNTER — Encounter (HOSPITAL_COMMUNITY): Payer: Self-pay

## 2015-03-18 VITALS — BP 152/78 | HR 65 | Temp 98.2°F | Resp 18

## 2015-03-18 DIAGNOSIS — M810 Age-related osteoporosis without current pathological fracture: Secondary | ICD-10-CM | POA: Diagnosis not present

## 2015-03-18 LAB — COMPREHENSIVE METABOLIC PANEL
ALT: 22 U/L (ref 14–54)
ANION GAP: 9 (ref 5–15)
AST: 22 U/L (ref 15–41)
Albumin: 3.7 g/dL (ref 3.5–5.0)
Alkaline Phosphatase: 45 U/L (ref 38–126)
BUN: 18 mg/dL (ref 6–20)
CALCIUM: 8.1 mg/dL — AB (ref 8.9–10.3)
CHLORIDE: 104 mmol/L (ref 101–111)
CO2: 24 mmol/L (ref 22–32)
Creatinine, Ser: 0.91 mg/dL (ref 0.44–1.00)
GFR, EST NON AFRICAN AMERICAN: 59 mL/min — AB (ref >60)
GLUCOSE: 92 mg/dL (ref 65–99)
POTASSIUM: 3.8 mmol/L (ref 3.5–5.1)
SODIUM: 137 mmol/L (ref 135–145)
TOTAL PROTEIN: 6.9 g/dL (ref 6.5–8.1)
Total Bilirubin: 0.5 mg/dL (ref 0.3–1.2)

## 2015-03-18 MED ORDER — DENOSUMAB 60 MG/ML ~~LOC~~ SOLN
60.0000 mg | Freq: Once | SUBCUTANEOUS | Status: AC
  Administered 2015-03-18: 60 mg via SUBCUTANEOUS
  Filled 2015-03-18: qty 1

## 2015-03-18 NOTE — Progress Notes (Signed)
..  [name] presents today for injection per the provider's orders.  prolia administration without incident; see MAR for injection details.  Patient tolerated procedure well and without incident.  No questions or complaints noted at this time.

## 2015-04-05 DIAGNOSIS — S76011A Strain of muscle, fascia and tendon of right hip, initial encounter: Secondary | ICD-10-CM | POA: Diagnosis not present

## 2015-04-05 DIAGNOSIS — H6501 Acute serous otitis media, right ear: Secondary | ICD-10-CM | POA: Diagnosis not present

## 2015-04-29 ENCOUNTER — Emergency Department (HOSPITAL_COMMUNITY)
Admission: EM | Admit: 2015-04-29 | Discharge: 2015-04-29 | Disposition: A | Discharge status: Home / Self Care | Payer: Medicare Other | Attending: Emergency Medicine | Admitting: Emergency Medicine

## 2015-04-29 ENCOUNTER — Emergency Department (HOSPITAL_COMMUNITY): Payer: Medicare Other

## 2015-04-29 ENCOUNTER — Encounter (HOSPITAL_COMMUNITY): Payer: Self-pay

## 2015-04-29 DIAGNOSIS — H9190 Unspecified hearing loss, unspecified ear: Secondary | ICD-10-CM | POA: Diagnosis not present

## 2015-04-29 DIAGNOSIS — Z853 Personal history of malignant neoplasm of breast: Secondary | ICD-10-CM | POA: Insufficient documentation

## 2015-04-29 DIAGNOSIS — K219 Gastro-esophageal reflux disease without esophagitis: Secondary | ICD-10-CM | POA: Insufficient documentation

## 2015-04-29 DIAGNOSIS — Z79899 Other long term (current) drug therapy: Secondary | ICD-10-CM | POA: Diagnosis not present

## 2015-04-29 DIAGNOSIS — E039 Hypothyroidism, unspecified: Secondary | ICD-10-CM | POA: Insufficient documentation

## 2015-04-29 DIAGNOSIS — S99922A Unspecified injury of left foot, initial encounter: Secondary | ICD-10-CM | POA: Diagnosis not present

## 2015-04-29 DIAGNOSIS — M1990 Unspecified osteoarthritis, unspecified site: Secondary | ICD-10-CM | POA: Insufficient documentation

## 2015-04-29 DIAGNOSIS — Z91040 Latex allergy status: Secondary | ICD-10-CM | POA: Diagnosis not present

## 2015-04-29 DIAGNOSIS — Z8619 Personal history of other infectious and parasitic diseases: Secondary | ICD-10-CM | POA: Insufficient documentation

## 2015-04-29 DIAGNOSIS — Z87442 Personal history of urinary calculi: Secondary | ICD-10-CM | POA: Insufficient documentation

## 2015-04-29 DIAGNOSIS — I10 Essential (primary) hypertension: Secondary | ICD-10-CM | POA: Diagnosis not present

## 2015-04-29 DIAGNOSIS — M25572 Pain in left ankle and joints of left foot: Secondary | ICD-10-CM

## 2015-04-29 DIAGNOSIS — Z9889 Other specified postprocedural states: Secondary | ICD-10-CM | POA: Diagnosis not present

## 2015-04-29 DIAGNOSIS — E785 Hyperlipidemia, unspecified: Secondary | ICD-10-CM | POA: Insufficient documentation

## 2015-04-29 DIAGNOSIS — M7732 Calcaneal spur, left foot: Secondary | ICD-10-CM | POA: Diagnosis not present

## 2015-04-29 DIAGNOSIS — M79672 Pain in left foot: Secondary | ICD-10-CM | POA: Diagnosis not present

## 2015-04-29 MED ORDER — HYDROCODONE-ACETAMINOPHEN 5-325 MG PO TABS: 1.0000 | ORAL_TABLET | Freq: Four times a day (QID) | ORAL | Status: DC | PRN

## 2015-04-29 NOTE — ED Notes (Addendum)
Pt reports 2 years ago she dropped a pot on her left foot and has had pain in top of foot and ankle since then.  Pt says had an xray around Feb or March but was told it didn't show anything.  Pt reports pain became worse during the night.  C/o burning sensation.  Pt ambulatory with limp.

## 2015-04-29 NOTE — Discharge Instructions (Signed)
Arthritis, Nonspecific °Arthritis is inflammation of a joint. This usually means pain, redness, warmth or swelling are present. One or more joints may be involved. There are a number of types of arthritis. Your caregiver may not be able to tell what type of arthritis you have right away. °CAUSES  °The most common cause of arthritis is the wear and tear on the joint (osteoarthritis). This causes damage to the cartilage, which can break down over time. The knees, hips, back and neck are most often affected by this type of arthritis. °Other types of arthritis and common causes of joint pain include: °· Sprains and other injuries near the joint. Sometimes minor sprains and injuries cause pain and swelling that develop hours later. °· Rheumatoid arthritis. This affects hands, feet and knees. It usually affects both sides of your body at the same time. It is often associated with chronic ailments, fever, weight loss and general weakness. °· Crystal arthritis. Gout and pseudo gout can cause occasional acute severe pain, redness and swelling in the foot, ankle, or knee. °· Infectious arthritis. Bacteria can get into a joint through a break in overlying skin. This can cause infection of the joint. Bacteria and viruses can also spread through the blood and affect your joints. °· Drug, infectious and allergy reactions. Sometimes joints can become mildly painful and slightly swollen with these types of illnesses. °SYMPTOMS  °· Pain is the main symptom. °· Your joint or joints can also be red, swollen and warm or hot to the touch. °· You may have a fever with certain types of arthritis, or even feel overall ill. °· The joint with arthritis will hurt with movement. Stiffness is present with some types of arthritis. °DIAGNOSIS  °Your caregiver will suspect arthritis based on your description of your symptoms and on your exam. Testing may be needed to find the type of arthritis: °· Blood and sometimes urine tests. °· X-ray tests  and sometimes CT or MRI scans. °· Removal of fluid from the joint (arthrocentesis) is done to check for bacteria, crystals or other causes. Your caregiver (or a specialist) will numb the area over the joint with a local anesthetic, and use a needle to remove joint fluid for examination. This procedure is only minimally uncomfortable. °· Even with these tests, your caregiver may not be able to tell what kind of arthritis you have. Consultation with a specialist (rheumatologist) may be helpful. °TREATMENT  °Your caregiver will discuss with you treatment specific to your type of arthritis. If the specific type cannot be determined, then the following general recommendations may apply. °Treatment of severe joint pain includes: °· Rest. °· Elevation. °· Anti-inflammatory medication (for example, ibuprofen) may be prescribed. Avoiding activities that cause increased pain. °· Only take over-the-counter or prescription medicines for pain and discomfort as recommended by your caregiver. °· Cold packs over an inflamed joint may be used for 10 to 15 minutes every hour. Hot packs sometimes feel better, but do not use overnight. Do not use hot packs if you are diabetic without your caregiver's permission. °· A cortisone shot into arthritic joints may help reduce pain and swelling. °· Any acute arthritis that gets worse over the next 1 to 2 days needs to be looked at to be sure there is no joint infection. °Long-term arthritis treatment involves modifying activities and lifestyle to reduce joint stress jarring. This can include weight loss. Also, exercise is needed to nourish the joint cartilage and remove waste. This helps keep the muscles   around the joint strong. °HOME CARE INSTRUCTIONS  °· Do not take aspirin to relieve pain if gout is suspected. This elevates uric acid levels. °· Only take over-the-counter or prescription medicines for pain, discomfort or fever as directed by your caregiver. °· Rest the joint as much as  possible. °· If your joint is swollen, keep it elevated. °· Use crutches if the painful joint is in your leg. °· Drinking plenty of fluids may help for certain types of arthritis. °· Follow your caregiver's dietary instructions. °· Try low-impact exercise such as: °¨ Swimming. °¨ Water aerobics. °¨ Biking. °¨ Walking. °· Morning stiffness is often relieved by a warm shower. °· Put your joints through regular range-of-motion. °SEEK MEDICAL CARE IF:  °· You do not feel better in 24 hours or are getting worse. °· You have side effects to medications, or are not getting better with treatment. °SEEK IMMEDIATE MEDICAL CARE IF:  °· You have a fever. °· You develop severe joint pain, swelling or redness. °· Many joints are involved and become painful and swollen. °· There is severe back pain and/or leg weakness. °· You have loss of bowel or bladder control. °Document Released: 09/28/2004 Document Revised: 11/13/2011 Document Reviewed: 10/14/2008 °ExitCare® Patient Information ©2015 [employer]. This information is not intended to replace advice given to you by your health care provider. Make sure you discuss any questions you have with your health care provider. ° °

## 2015-04-29 NOTE — ED Provider Notes (Signed)
CSN: [id number]     Arrival date & time 04/29/15  1037 History   First MD Initiated Contact with Patient 04/29/15 1039     Chief Complaint  Patient presents with  . Ankle Pain     (Consider location/radiation/quality/duration/timing/severity/associated sxs/prior Treatment) HPI Comments: Pt comes in with c/o left foot and ankle pain that has been going on intermittently over the last 2 years. She states that last night the pain kept her up it was so bad. She took ibuprofen and heat which didn't help. Denies redness or swelling to the area. Pt is not a diabetic. She states that it has been happening since she dropped a pot on her foot. Denies numbness or weakness to the area  The history is provided by the patient. No language interpreter was used.    Past Medical History  Diagnosis Date  . History of esophageal spasm   . Breast cancer 2003  . Syncope, non cardiac   . Osteoarthritis   . Diverticulitis     History  . Hypokalemia   . Osteoporosis 09/08/2011  . Dyslexia   . Infiltrating lobular carcinoma 04/04/2013  . HTN (hypertension)     takes Diovan daily  . Hyperlipidemia     takes Pravastatin daily  . Hypothyroidism     takes Synthroid daily  . GERD (gastroesophageal reflux disease)     takes Nexium daily  . PONV (postoperative nausea and vomiting)   . Headache(784.0)     all the time  . Neck pain     pinched   . Weakness     numbness left arm  . Joint pain   . Joint swelling   . Impaired hearing   . History of kidney stones   . H/O hiatal hernia   . History of IBS     no problem in past yr  . History of shingles    Past Surgical History  Procedure Laterality Date  . Thyroidectomy      Hypothyroidism status post  . Cholecystectomy  1997    Status post -stones  . Laparoscopic nissen fundoplication  2440  . Arm fx      left  . Breast lumpectomy      Left  . Knee surgery      bilateral  . Kidney stone surgery  1992  . Mandible reconstruction    . Cataract  extraction    . Neck surgery  2000    [facility]  . Cataract extraction w/phaco  10/02/2011    Procedure: CATARACT EXTRACTION PHACO AND INTRAOCULAR LENS PLACEMENT (IOC);  Surgeon: [name], MD;  Location: [facility];  Service: Ophthalmology;  Laterality: Right;  CDE:17.01  . Kidney stent  06/04/11    removed after about 3 weeks.  . Lithotripsy  11/12  . Tubal ligation    . Wrist surgery Left 2009  . Shoulder surgery Left   . Nissen fundoplication    . Egd with dilitation    . Colonoscopy    . Cardiac catheterization      x 3-last one in the 90's per pt  . Parathyroidectomy  12/03/2013    DR TEOH  . Thyroidectomy N/A 12/03/2013    Procedure: PARATHYROIDECTOMY;  Surgeon: [name], MD;  Location: [facility];  Service: ENT;  Laterality: N/A;   Family History  Problem Relation Age of Onset  . Anesthesia problems Neg Hx   . Hypotension Neg Hx   . Pseudochol deficiency Neg Hx   . Malignant  hyperthermia Neg Hx   . Macular degeneration Sister    Social History  Substance Use Topics  . Smoking status: Never Smoker   . Smokeless tobacco: Never Used  . Alcohol Use: No     Comment: occasionally   OB History    No data available     Review of Systems  All other systems reviewed and are negative.     Allergies  Metronidazole; Oxycodone; Ciprofloxacin; and Latex  Home Medications   Prior to Admission medications   Medication Sig Start Date End Date Taking? Authorizing Provider  Cyanocobalamin (VITAMIN B-12) 2500 MCG SUBL Place 1 tablet under the tongue daily.    Historical Provider, MD  esomeprazole (NEXIUM) 40 MG capsule Take 40 mg by mouth daily.     Historical Provider, MD  levothyroxine (SYNTHROID, LEVOTHROID) 88 MCG tablet Take 88 mcg by mouth daily before breakfast.    Historical Provider, MD  Multiple Vitamins-Minerals (CENTRUM SILVER ADULT 50+ PO) Take 1 capsule by mouth daily.    Historical Provider, MD  Naproxen Sodium (ALEVE) 220 MG CAPS Take 440 mg by mouth 2 (two) times daily as  needed (pain).     Historical Provider, MD  nitrofurantoin, macrocrystal-monohydrate, (MACROBID) 100 MG capsule Take 1 capsule (100 mg total) by mouth 2 (two) times daily. 10/29/14   Patrici Ranks, MD  pravastatin (PRAVACHOL) 20 MG tablet Take 20 mg by mouth daily.    Historical Provider, MD  valsartan (DIOVAN) 80 MG tablet Take 80 mg by mouth daily.    Historical Provider, MD   BP 170/104 mmHg  Pulse 71  Temp(Src) 98.2 F (36.8 C) (Oral)  Ht 5' 6.5" (1.689 m)  Wt 162 lb (73.483 kg)  BMI 25.76 kg/m2  SpO2 97% Physical Exam  Constitutional: She is oriented to person, place, and time. She appears well-developed and well-nourished.  Cardiovascular: Normal rate and regular rhythm.   Pulmonary/Chest: Effort normal and breath sounds normal.  Musculoskeletal:  Generalized tenderness to the left foot and ankle. No redness or warmth noted. No gross deformity. Pulses intact  Neurological: She is alert and oriented to person, place, and time.  Skin: Skin is warm and dry.  Psychiatric: She has a normal mood and affect.  Nursing note and vitals reviewed.   ED Course  Procedures (including critical care time) Labs Review Labs Reviewed - No data to display  Imaging Review Dg Ankle Complete Left  04/29/2015   CLINICAL DATA:  Chronic left ankle pain.  EXAM: LEFT ANKLE COMPLETE - 3+ VIEW  COMPARISON:  None.  FINDINGS: There is no evidence of fracture, subluxation or dislocation.  A moderate to large calcaneal spur is present.  The joint spaces are unremarkable.  No focal bony lesions are present.  Vascular calcifications are noted.  IMPRESSION: No acute abnormalities.  Moderate -large calcaneal spur.   Electronically Signed   By: Margarette Canada M.D.   On: 04/29/2015 11:52   Dg Foot Complete Left  04/29/2015   CLINICAL DATA:  Chronic left foot pain for 2 years following injury.  EXAM: LEFT FOOT - COMPLETE 3+ VIEW  COMPARISON:  None.  FINDINGS: There is no evidence of fracture, subluxation or  dislocation.  Mild degenerative changes at the second and third tarsometatarsal joints noted.  A moderate -large calcaneal spur is present.  Vascular calcifications are noted.  No other focal bony abnormalities are identified.  IMPRESSION: No evidence of acute abnormality.  Mild midfoot degenerative changes and moderate to large calcaneal spur.  Electronically Signed   By: Margarette Canada M.D.   On: 04/29/2015 11:50   I have personally reviewed and evaluated these images and lab results as part of my medical decision-making.   EKG Interpretation None      MDM   Final diagnoses:  Ankle pain, left    No acute abnormality noted. Pt given ortho follow up with and pain medication as needed.    Glendell Docker, NP 04/29/15 1211  Davonna Belling, MD 04/29/15 [id number]

## 2015-06-09 DIAGNOSIS — Z23 Encounter for immunization: Secondary | ICD-10-CM | POA: Diagnosis not present

## 2015-07-05 ENCOUNTER — Other Ambulatory Visit: Payer: Self-pay

## 2015-07-05 ENCOUNTER — Other Ambulatory Visit (HOSPITAL_COMMUNITY): Payer: Self-pay | Admitting: Family Medicine

## 2015-07-05 DIAGNOSIS — F331 Major depressive disorder, recurrent, moderate: Secondary | ICD-10-CM | POA: Diagnosis not present

## 2015-07-05 DIAGNOSIS — Z1231 Encounter for screening mammogram for malignant neoplasm of breast: Secondary | ICD-10-CM

## 2015-07-05 DIAGNOSIS — G301 Alzheimer's disease with late onset: Secondary | ICD-10-CM | POA: Diagnosis not present

## 2015-07-08 DIAGNOSIS — R413 Other amnesia: Secondary | ICD-10-CM | POA: Diagnosis not present

## 2015-07-08 DIAGNOSIS — F0390 Unspecified dementia without behavioral disturbance: Secondary | ICD-10-CM | POA: Diagnosis not present

## 2015-08-09 ENCOUNTER — Ambulatory Visit (HOSPITAL_COMMUNITY)
Admission: RE | Admit: 2015-08-09 | Discharge: 2015-08-09 | Disposition: A | Discharge status: Home / Self Care | Payer: Medicare Other | Source: Ambulatory Visit | Attending: Family Medicine | Admitting: Family Medicine

## 2015-08-09 DIAGNOSIS — Z1231 Encounter for screening mammogram for malignant neoplasm of breast: Secondary | ICD-10-CM | POA: Insufficient documentation

## 2015-08-09 DIAGNOSIS — Z853 Personal history of malignant neoplasm of breast: Secondary | ICD-10-CM | POA: Diagnosis not present

## 2015-09-07 ENCOUNTER — Other Ambulatory Visit (HOSPITAL_COMMUNITY): Payer: Self-pay

## 2015-09-10 DIAGNOSIS — E782 Mixed hyperlipidemia: Secondary | ICD-10-CM | POA: Diagnosis not present

## 2015-09-10 DIAGNOSIS — I10 Essential (primary) hypertension: Secondary | ICD-10-CM | POA: Diagnosis not present

## 2015-09-17 DIAGNOSIS — G301 Alzheimer's disease with late onset: Secondary | ICD-10-CM | POA: Diagnosis not present

## 2015-09-17 DIAGNOSIS — M170 Bilateral primary osteoarthritis of knee: Secondary | ICD-10-CM | POA: Diagnosis not present

## 2015-09-17 DIAGNOSIS — K219 Gastro-esophageal reflux disease without esophagitis: Secondary | ICD-10-CM | POA: Diagnosis not present

## 2015-09-17 DIAGNOSIS — I10 Essential (primary) hypertension: Secondary | ICD-10-CM | POA: Diagnosis not present

## 2015-09-17 DIAGNOSIS — E039 Hypothyroidism, unspecified: Secondary | ICD-10-CM | POA: Diagnosis not present

## 2015-09-17 DIAGNOSIS — F331 Major depressive disorder, recurrent, moderate: Secondary | ICD-10-CM | POA: Diagnosis not present

## 2015-09-17 DIAGNOSIS — E782 Mixed hyperlipidemia: Secondary | ICD-10-CM | POA: Diagnosis not present

## 2015-09-17 DIAGNOSIS — M810 Age-related osteoporosis without current pathological fracture: Secondary | ICD-10-CM | POA: Diagnosis not present

## 2015-09-21 ENCOUNTER — Encounter (HOSPITAL_COMMUNITY): Payer: Medicare Other | Attending: Hematology & Oncology

## 2015-09-21 ENCOUNTER — Encounter (HOSPITAL_COMMUNITY): Payer: Medicare Other

## 2015-09-21 DIAGNOSIS — M810 Age-related osteoporosis without current pathological fracture: Secondary | ICD-10-CM

## 2015-09-21 LAB — COMPREHENSIVE METABOLIC PANEL
ALBUMIN: 3.5 g/dL (ref 3.5–5.0)
ALT: 12 U/L — AB (ref 14–54)
ANION GAP: 7 (ref 5–15)
AST: 20 U/L (ref 15–41)
Alkaline Phosphatase: 53 U/L (ref 38–126)
BUN: 18 mg/dL (ref 6–20)
CHLORIDE: 106 mmol/L (ref 101–111)
CO2: 26 mmol/L (ref 22–32)
Calcium: 8.8 mg/dL — ABNORMAL LOW (ref 8.9–10.3)
Creatinine, Ser: 0.77 mg/dL (ref 0.44–1.00)
GFR calc non Af Amer: >60 mL/min (ref >60)
GLUCOSE: 94 mg/dL (ref 65–99)
Potassium: 3.5 mmol/L (ref 3.5–5.1)
SODIUM: 139 mmol/L (ref 135–145)
Total Bilirubin: 0.5 mg/dL (ref 0.3–1.2)
Total Protein: 7.2 g/dL (ref 6.5–8.1)

## 2015-09-21 NOTE — Patient Instructions (Signed)
..  [place] at [facility] Discharge Instructions  RECOMMENDATIONS MADE BY THE CONSULTANT AND ANY TEST RESULTS WILL BE SENT TO YOUR REFERRING PHYSICIAN.  We need to hold your prolia for 1 month and recheck your calcium Make sure you are taking calcium and vitamin D as prescribed  Thank you for choosing [place] at [facility] to provide your oncology and hematology care.  To afford each patient quality time with our provider, please arrive at least 15 minutes before your scheduled appointment time.    You need to re-schedule your appointment should you arrive 10 or more minutes late.  We strive to give you quality time with our providers, and arriving late affects you and other patients whose appointments are after yours.  Also, if you no show three or more times for appointments you may be dismissed from the clinic at the providers discretion.     Again, thank you for choosing [facility].  Our hope is that these requests will decrease the amount of time that you wait before being seen by our physicians.       _____________________________________________________________  Should you have questions after your visit to [facility], please contact our office at [phone] between the hours of 8:30 a.m. and 4:30 p.m.  Voicemails left after 4:30 p.m. will not be returned until the following business day.  For prescription refill requests, have your pharmacy contact our office.

## 2015-09-21 NOTE — Progress Notes (Signed)
Discussed labs with T. Kefalas PA-C and Prolia held today. Patient instructed on taking calcium and vitamin d and to return in 1 month to recheck labs and poss prolia.

## 2015-10-06 DIAGNOSIS — Z9221 Personal history of antineoplastic chemotherapy: Secondary | ICD-10-CM | POA: Diagnosis not present

## 2015-10-06 DIAGNOSIS — Z79899 Other long term (current) drug therapy: Secondary | ICD-10-CM | POA: Diagnosis not present

## 2015-10-06 DIAGNOSIS — M8580 Other specified disorders of bone density and structure, unspecified site: Secondary | ICD-10-CM | POA: Diagnosis not present

## 2015-10-06 DIAGNOSIS — Z780 Asymptomatic menopausal state: Secondary | ICD-10-CM | POA: Diagnosis not present

## 2015-10-06 DIAGNOSIS — M810 Age-related osteoporosis without current pathological fracture: Secondary | ICD-10-CM | POA: Diagnosis not present

## 2015-10-06 DIAGNOSIS — C50919 Malignant neoplasm of unspecified site of unspecified female breast: Secondary | ICD-10-CM | POA: Diagnosis not present

## 2015-10-06 DIAGNOSIS — E7800 Pure hypercholesterolemia, unspecified: Secondary | ICD-10-CM | POA: Diagnosis not present

## 2015-10-06 DIAGNOSIS — K219 Gastro-esophageal reflux disease without esophagitis: Secondary | ICD-10-CM | POA: Diagnosis not present

## 2015-10-06 DIAGNOSIS — E039 Hypothyroidism, unspecified: Secondary | ICD-10-CM | POA: Diagnosis not present

## 2015-10-06 DIAGNOSIS — Z923 Personal history of irradiation: Secondary | ICD-10-CM | POA: Diagnosis not present

## 2015-10-19 ENCOUNTER — Other Ambulatory Visit: Payer: Self-pay

## 2015-10-19 ENCOUNTER — Encounter (HOSPITAL_BASED_OUTPATIENT_CLINIC_OR_DEPARTMENT_OTHER): Payer: Medicare Other

## 2015-10-19 ENCOUNTER — Encounter (HOSPITAL_COMMUNITY): Payer: Self-pay | Admitting: *Deleted

## 2015-10-19 ENCOUNTER — Emergency Department (HOSPITAL_COMMUNITY)
Admission: EM | Admit: 2015-10-19 | Discharge: 2015-10-19 | Disposition: A | Discharge status: Home / Self Care | Payer: Medicare Other | Attending: Emergency Medicine | Admitting: Emergency Medicine

## 2015-10-19 ENCOUNTER — Encounter (HOSPITAL_COMMUNITY): Payer: Medicare Other | Attending: Hematology & Oncology

## 2015-10-19 VITALS — BP 177/83 | HR 67 | Temp 98.1°F | Resp 18

## 2015-10-19 DIAGNOSIS — Z9889 Other specified postprocedural states: Secondary | ICD-10-CM | POA: Diagnosis not present

## 2015-10-19 DIAGNOSIS — Z87442 Personal history of urinary calculi: Secondary | ICD-10-CM | POA: Insufficient documentation

## 2015-10-19 DIAGNOSIS — M810 Age-related osteoporosis without current pathological fracture: Secondary | ICD-10-CM | POA: Insufficient documentation

## 2015-10-19 DIAGNOSIS — K219 Gastro-esophageal reflux disease without esophagitis: Secondary | ICD-10-CM | POA: Diagnosis not present

## 2015-10-19 DIAGNOSIS — M1990 Unspecified osteoarthritis, unspecified site: Secondary | ICD-10-CM | POA: Insufficient documentation

## 2015-10-19 DIAGNOSIS — Z91040 Latex allergy status: Secondary | ICD-10-CM | POA: Insufficient documentation

## 2015-10-19 DIAGNOSIS — E039 Hypothyroidism, unspecified: Secondary | ICD-10-CM | POA: Insufficient documentation

## 2015-10-19 DIAGNOSIS — H9190 Unspecified hearing loss, unspecified ear: Secondary | ICD-10-CM | POA: Insufficient documentation

## 2015-10-19 DIAGNOSIS — Z79899 Other long term (current) drug therapy: Secondary | ICD-10-CM | POA: Insufficient documentation

## 2015-10-19 DIAGNOSIS — E785 Hyperlipidemia, unspecified: Secondary | ICD-10-CM | POA: Insufficient documentation

## 2015-10-19 DIAGNOSIS — I10 Essential (primary) hypertension: Secondary | ICD-10-CM

## 2015-10-19 DIAGNOSIS — Z853 Personal history of malignant neoplasm of breast: Secondary | ICD-10-CM | POA: Diagnosis not present

## 2015-10-19 DIAGNOSIS — R51 Headache: Secondary | ICD-10-CM | POA: Diagnosis not present

## 2015-10-19 DIAGNOSIS — Z7952 Long term (current) use of systemic steroids: Secondary | ICD-10-CM | POA: Diagnosis not present

## 2015-10-19 DIAGNOSIS — Z8619 Personal history of other infectious and parasitic diseases: Secondary | ICD-10-CM | POA: Insufficient documentation

## 2015-10-19 LAB — COMPREHENSIVE METABOLIC PANEL
ALBUMIN: 3.7 g/dL (ref 3.5–5.0)
ALT: 14 U/L (ref 14–54)
ANION GAP: 8 (ref 5–15)
AST: 23 U/L (ref 15–41)
Alkaline Phosphatase: 53 U/L (ref 38–126)
BILIRUBIN TOTAL: 0.4 mg/dL (ref 0.3–1.2)
BUN: 17 mg/dL (ref 6–20)
CHLORIDE: 105 mmol/L (ref 101–111)
CO2: 28 mmol/L (ref 22–32)
Calcium: 9.3 mg/dL (ref 8.9–10.3)
Creatinine, Ser: 0.78 mg/dL (ref 0.44–1.00)
GFR calc Af Amer: >60 mL/min (ref >60)
Glucose, Bld: 94 mg/dL (ref 65–99)
POTASSIUM: 3.6 mmol/L (ref 3.5–5.1)
Sodium: 141 mmol/L (ref 135–145)
TOTAL PROTEIN: 7.1 g/dL (ref 6.5–8.1)

## 2015-10-19 MED ORDER — DIPHENHYDRAMINE HCL 50 MG/ML IJ SOLN
12.5000 mg | Freq: Once | INTRAMUSCULAR | Status: AC
  Administered 2015-10-19: 12.5 mg via INTRAVENOUS
  Filled 2015-10-19: qty 1

## 2015-10-19 MED ORDER — METOCLOPRAMIDE HCL 5 MG/ML IJ SOLN: 10.0000 mg | Freq: Once | INTRAMUSCULAR | Status: DC

## 2015-10-19 MED ORDER — DENOSUMAB 60 MG/ML ~~LOC~~ SOLN
60.0000 mg | Freq: Once | SUBCUTANEOUS | Status: AC
  Administered 2015-10-19: 60 mg via SUBCUTANEOUS
  Filled 2015-10-19: qty 1

## 2015-10-19 MED ORDER — METOCLOPRAMIDE HCL 5 MG/ML IJ SOLN
5.0000 mg | Freq: Once | INTRAMUSCULAR | Status: AC
  Administered 2015-10-19: 5 mg via INTRAVENOUS
  Filled 2015-10-19: qty 2

## 2015-10-19 MED ORDER — KETOROLAC TROMETHAMINE 30 MG/ML IJ SOLN
30.0000 mg | Freq: Once | INTRAMUSCULAR | Status: AC
  Administered 2015-10-19: 30 mg via INTRAVENOUS
  Filled 2015-10-19: qty 1

## 2015-10-19 MED ORDER — SODIUM CHLORIDE 0.9 % IV BOLUS (SEPSIS)
500.0000 mL | Freq: Once | INTRAVENOUS | Status: AC
  Administered 2015-10-19: 500 mL via INTRAVENOUS

## 2015-10-19 NOTE — ED Notes (Signed)
Pt states she woke up feeling fine and began having a headache at and unknown time. Pt states she doesn't think she woke up with the headache. Pt went to cancer center for checkup from previous Breast cancer and they told her she had HTN. Pt has no deficits at this time.

## 2015-10-19 NOTE — ED Provider Notes (Signed)
CSN: [id number]     Arrival date & time 10/19/15  1404 History   First MD Initiated Contact with Patient 10/19/15 1413     Chief Complaint  Patient presents with  . Hypertension  . Headache     (Consider location/radiation/quality/duration/timing/severity/associated sxs/prior Treatment) HPI..... Patient complains of right temporal headache since this morning. She went to a follow-up visit at the [facility] today and was told her blood pressure was elevated at 177/83. She was sent to the emergency department for evaluation of same. No fever, sweats, chills, stiff neck, neurological deficits. She has hypertension which is generally well controlled. She has taken nothing for her headache.  Past Medical History  Diagnosis Date  . History of esophageal spasm   . Breast cancer ([place]) 2003  . Syncope, non cardiac   . Osteoarthritis   . Diverticulitis     History  . Hypokalemia   . Osteoporosis 09/08/2011  . Dyslexia   . Infiltrating lobular carcinoma ([place]) 04/04/2013  . HTN (hypertension)     takes Diovan daily  . Hyperlipidemia     takes Pravastatin daily  . Hypothyroidism     takes Synthroid daily  . GERD (gastroesophageal reflux disease)     takes Nexium daily  . PONV (postoperative nausea and vomiting)   . Headache(784.0)     all the time  . Neck pain     pinched   . Weakness     numbness left arm  . Joint pain   . Joint swelling   . Impaired hearing   . History of kidney stones   . H/O hiatal hernia   . History of IBS     no problem in past yr  . History of shingles    Past Surgical History  Procedure Laterality Date  . Thyroidectomy      Hypothyroidism status post  . Cholecystectomy  1997    Status post -stones  . Laparoscopic nissen fundoplication  0000000  . Arm fx      left  . Breast lumpectomy      Left  . Knee surgery      bilateral  . Kidney stone surgery  1992  . Mandible reconstruction    . Cataract extraction    . Neck surgery  2000    [facility]  .  Cataract extraction w/phaco  10/02/2011    Procedure: CATARACT EXTRACTION PHACO AND INTRAOCULAR LENS PLACEMENT (IOC);  Surgeon: [name], MD;  Location: [facility];  Service: Ophthalmology;  Laterality: Right;  CDE:17.01  . Kidney stent  06/04/11    removed after about 3 weeks.  . Lithotripsy  11/12  . Tubal ligation    . Wrist surgery Left 2009  . Shoulder surgery Left   . Nissen fundoplication    . Egd with dilitation    . Colonoscopy    . Cardiac catheterization      x 3-last one in the 90's per pt  . Parathyroidectomy  12/03/2013    DR TEOH  . Thyroidectomy N/A 12/03/2013    Procedure: PARATHYROIDECTOMY;  Surgeon: [name], MD;  Location: [facility] OR;  Service: ENT;  Laterality: N/A;   Family History  Problem Relation Age of Onset  . Anesthesia problems Neg Hx   . Hypotension Neg Hx   . Pseudochol deficiency Neg Hx   . Malignant hyperthermia Neg Hx   . Macular degeneration Sister    Social History  Substance Use Topics  . Smoking status: Never Smoker   .  Smokeless tobacco: Never Used  . Alcohol Use: No     Comment: occasionally   OB History    No data available     Review of Systems  All other systems reviewed and are negative.     Allergies  Metronidazole; Oxycodone; Ciprofloxacin; and Latex  Home Medications   Prior to Admission medications   Medication Sig Start Date End Date Taking? Authorizing Provider  CALCIUM PO Take 2 tablets by mouth daily.   Yes Historical Provider, MD  Cyanocobalamin (VITAMIN B-12) 2500 MCG SUBL Place 1 tablet under the tongue daily.   Yes Historical Provider, MD  esomeprazole (NEXIUM) 40 MG capsule Take 40 mg by mouth daily.    Yes Historical Provider, MD  levothyroxine (SYNTHROID, LEVOTHROID) 88 MCG tablet Take 88 mcg by mouth daily before breakfast.   Yes Historical Provider, MD  Multiple Vitamins-Minerals (CENTRUM SILVER ADULT 50+ PO) Take 1 capsule by mouth daily.   Yes Historical Provider, MD  Naproxen Sodium (ALEVE) 220 MG CAPS Take  440 mg by mouth 2 (two) times daily as needed (pain).    Yes Historical Provider, MD  pravastatin (PRAVACHOL) 20 MG tablet Take 20 mg by mouth daily.   Yes Historical Provider, MD  valsartan (DIOVAN) 80 MG tablet Take 80 mg by mouth daily.   Yes Historical Provider, MD  mometasone (ELOCON) 0.1 % lotion Apply 1 application topically daily.  09/17/15   Historical Provider, MD   BP 163/89 mmHg  Pulse 60  Temp(Src) 98.6 F (37 C) (Oral)  Resp 16  Ht 5\' 6"  (1.676 m)  Wt 160 lb (72.576 kg)  BMI 25.84 kg/m2  SpO2 97% Physical Exam  Constitutional: She is oriented to person, place, and time. She appears well-developed and well-nourished.  HENT:  Head: Normocephalic and atraumatic.  Eyes: Conjunctivae and EOM are normal. Pupils are equal, round, and reactive to light.  Neck: Normal range of motion. Neck supple.  Cardiovascular: Normal rate and regular rhythm.   Pulmonary/Chest: Effort normal and breath sounds normal.  Abdominal: Soft. Bowel sounds are normal.  Musculoskeletal: Normal range of motion.  Neurological: She is alert and oriented to person, place, and time.  Skin: Skin is warm and dry.  Psychiatric: She has a normal mood and affect. Her behavior is normal.  Nursing note and vitals reviewed.   ED Course  Procedures (including critical care time) Labs Review Labs Reviewed - No data to display  Imaging Review No results found. I have personally reviewed and evaluated these images and lab results as part of my medical decision-making.   EKG Interpretation None      MDM   Final diagnoses:  Essential hypertension    Patient is in no acute distress. Her systolic blood pressure slightly elevated. No neurological deficits. She was observed for approximately 2 hours. We'll discharge her with primary care follow-up.    Nat Christen, MD 10/19/15 1723

## 2015-10-19 NOTE — Discharge Instructions (Signed)
Follow-up your primary care doctor for blood pressure management.

## 2015-10-19 NOTE — Progress Notes (Signed)
[name] presents today for injection per MD orders. Prolia administered SQ in right Abdomen. Administration without incident. Patient tolerated well.  Patient c/o headache to the right lateral part of head.  States that she usually doesn't have headaches and this one is "terrible".  She reports that she had stopped taking her BP medication recently because her blood pressure was "so good".  I educated her on the need to continue medications even when her blood pressure was good unless otherwise instructed by her MD.  Robynn Pane, PA was informed of the elevated BP as well as the headache and he recommended tat the patient go to the ER for evaluation.  The patient agreed, but refused to allow me to put her in a wheelchair.  She left the clinic ambulatory and went to the ED.

## 2015-10-19 NOTE — Patient Instructions (Signed)
[place] at [facility] Discharge Instructions  RECOMMENDATIONS MADE BY THE CONSULTANT AND ANY TEST RESULTS WILL BE SENT TO YOUR REFERRING PHYSICIAN.  Prolia today.   Please follow up in the ER as you leave today for high blood pressure and headache.    Thank you for choosing [place] at [facility] to provide your oncology and hematology care.  To afford each patient quality time with our provider, please arrive at least 15 minutes before your scheduled appointment time.   Beginning January 23rd 2017 lab work for the [facility] will be done in the  Main lab at [facility] on 1st floor. If you have a lab appointment with the [facility] please come in thru the  Main Entrance and check in at the main information desk  You need to re-schedule your appointment should you arrive 10 or more minutes late.  We strive to give you quality time with our providers, and arriving late affects you and other patients whose appointments are after yours.  Also, if you no show three or more times for appointments you may be dismissed from the clinic at the providers discretion.     Again, thank you for choosing [facility].  Our hope is that these requests will decrease the amount of time that you wait before being seen by our physicians.       _____________________________________________________________  Should you have questions after your visit to [facility], please contact our office at [phone] between the hours of 8:30 a.m. and 4:30 p.m.  Voicemails left after 4:30 p.m. will not be returned until the following business day.  For prescription refill requests, have your pharmacy contact our office.

## 2015-10-19 NOTE — ED Notes (Signed)
Patient with no complaints at this time. Respirations even and unlabored. Skin warm/dry. Discharge instructions reviewed with patient at this time. Patient given opportunity to voice concerns/ask questions. IV removed per policy and band-aid applied to site. Patient discharged at this time and left Emergency Department with steady gait.  

## 2015-10-27 DIAGNOSIS — I10 Essential (primary) hypertension: Secondary | ICD-10-CM | POA: Diagnosis not present

## 2015-10-28 ENCOUNTER — Other Ambulatory Visit (HOSPITAL_COMMUNITY): Payer: Self-pay | Admitting: Hematology & Oncology

## 2015-10-28 ENCOUNTER — Encounter (HOSPITAL_BASED_OUTPATIENT_CLINIC_OR_DEPARTMENT_OTHER): Payer: Medicare Other | Admitting: Hematology & Oncology

## 2015-10-28 ENCOUNTER — Ambulatory Visit (HOSPITAL_COMMUNITY): Payer: Medicare Other | Admitting: Hematology & Oncology

## 2015-10-28 ENCOUNTER — Encounter (HOSPITAL_COMMUNITY): Payer: Self-pay | Admitting: Hematology & Oncology

## 2015-10-28 VITALS — BP 144/74 | HR 75 | Temp 98.6°F | Resp 18 | Wt 155.0 lb

## 2015-10-28 DIAGNOSIS — Z1231 Encounter for screening mammogram for malignant neoplasm of breast: Secondary | ICD-10-CM

## 2015-10-28 DIAGNOSIS — C50912 Malignant neoplasm of unspecified site of left female breast: Secondary | ICD-10-CM

## 2015-10-28 DIAGNOSIS — M810 Age-related osteoporosis without current pathological fracture: Secondary | ICD-10-CM

## 2015-10-28 DIAGNOSIS — Z853 Personal history of malignant neoplasm of breast: Secondary | ICD-10-CM

## 2015-10-28 NOTE — Patient Instructions (Addendum)
[place] at [facility] Discharge Instructions  RECOMMENDATIONS MADE BY THE CONSULTANT AND ANY TEST RESULTS WILL BE SENT TO YOUR REFERRING PHYSICIAN.    Exam and discussion by Dr Whitney Muse today Clinical breast exam today Return to see the doctor in 1 year  Prolia with lab work prior every 6 months Mammogram scheduled  Please call the clinic if you have any questions or concerns    Thank you for choosing [place] at [facility] to provide your oncology and hematology care.  To afford each patient quality time with our provider, please arrive at least 15 minutes before your scheduled appointment time.   Beginning January 23rd 2017 lab work for the [facility] will be done in the  Main lab at [facility] on 1st floor. If you have a lab appointment with the [facility] please come in thru the  Main Entrance and check in at the main information desk  You need to re-schedule your appointment should you arrive 10 or more minutes late.  We strive to give you quality time with our providers, and arriving late affects you and other patients whose appointments are after yours.  Also, if you no show three or more times for appointments you may be dismissed from the clinic at the providers discretion.     Again, thank you for choosing [facility].  Our hope is that these requests will decrease the amount of time that you wait before being seen by our physicians.       _____________________________________________________________  Should you have questions after your visit to [facility], please contact our office at [phone] between the hours of 8:30 a.m. and 4:30 p.m.  Voicemails left after 4:30 p.m. will not be returned until the following business day.  For prescription refill requests, have your pharmacy contact our office.

## 2015-10-28 NOTE — Progress Notes (Signed)
Belinda Ponto, MD [name] [id number]  Stage IIA, infiltrating lobular carcinoma diagnosed in 2003 Partial mastectomy 02/25/2002 ER+ PR+ Her-2 neu negative Adjuvant XRT Completed 5 years of AI therapy History of abnormal CT of the chest, last performed in 09/26/2013  CURRENT THERAPY: NCCN guidelines for surveillance.  Reclast annually for osteoporosis (last given on 09/27/2012).  INTERVAL HISTORY: [name] 81 y.o. female returns for  regular  visit for followup of stage II A. (T2, N0, M0) infiltrating lobular carcinoma left breast diagnosed in 2003 status post partial mastectomy on 02/25/2002 with a 3.0 x 3.5 cm primary ER +99% PR +80% HER-2/neu nonamplified and Ki-67 marker was low at 10% surgery with negative sentinel nodes felt to be well-differentiated status post radiation therapy followed by Arimidex initially, which she could not tolerate, followed by letrozole and she completed 5 full years of adjuvant hormonal therapy.  [name] came in alone today.   She says that she has been having headaches but that these have been due to long car rides that she has been on recently and are because of the stress due to her daughters recent death.   She says that she has been eating a lot of junk food and that her daughter tells her frequently that she does not eat a lot. She denies this and says that she eats enough. She said that she has been mostly eating fruit and junk and does not like bread.   Her primary care physician has switched her to a new blood pressure medicine. She has not had kidney stones since her last visit. She is up to date on mammograms, 08/10/2015.  Play cards on Tuesday with seniors. She plans on going to exercise in the senior center. She feels like she is doing fairly well.  Past Medical History  Diagnosis Date  . History of esophageal spasm   . Breast cancer ([place]) 2003  . Syncope, non cardiac   . Osteoarthritis   . Diverticulitis     History  .  Hypokalemia   . Osteoporosis 09/08/2011  . Dyslexia   . Infiltrating lobular carcinoma ([place]) 04/04/2013  . HTN (hypertension)     takes Diovan daily  . Hyperlipidemia     takes Pravastatin daily  . Hypothyroidism     takes Synthroid daily  . GERD (gastroesophageal reflux disease)     takes Nexium daily  . PONV (postoperative nausea and vomiting)   . Headache(784.0)     all the time  . Neck pain     pinched   . Weakness     numbness left arm  . Joint pain   . Joint swelling   . Impaired hearing   . History of kidney stones   . H/O hiatal hernia   . History of IBS     no problem in past yr  . History of shingles     has FULL INCONTINENCE OF FECES; DIARRHEA; Osteoporosis; Syncope and collapse; Chest pain; Dehydration; Acute renal failure ([place]); HTN (hypertension); Hypothyroid; GERD (gastroesophageal reflux disease); Infiltrating lobular carcinoma, left breast; Diverticulitis; Generalized weakness; Acute diverticulitis; Neck mass; and S/P parathyroidectomy on her problem list.     is allergic to metronidazole; oxycodone; ciprofloxacin; and latex.  [name] does not currently have medications on file.  Past Surgical History  Procedure Laterality Date  . Thyroidectomy      Hypothyroidism status post  . Cholecystectomy  1997    Status post -stones  . Laparoscopic nissen  fundoplication  1017  . Arm fx      left  . Breast lumpectomy      Left  . Knee surgery      bilateral  . Kidney stone surgery  1992  . Mandible reconstruction    . Cataract extraction    . Neck surgery  2000    [facility]  . Cataract extraction w/phaco  10/02/2011    Procedure: CATARACT EXTRACTION PHACO AND INTRAOCULAR LENS PLACEMENT (IOC);  Surgeon: [name], MD;  Location: [facility];  Service: Ophthalmology;  Laterality: Right;  CDE:17.01  . Kidney stent  06/04/11    removed after about 3 weeks.  . Lithotripsy  11/12  . Tubal ligation    . Wrist surgery Left 2009  . Shoulder surgery Left   . Nissen  fundoplication    . Egd with dilitation    . Colonoscopy    . Cardiac catheterization      x 3-last one in the 90's per pt  . Parathyroidectomy  12/03/2013    DR TEOH  . Thyroidectomy N/A 12/03/2013    Procedure: PARATHYROIDECTOMY;  Surgeon: [name], MD;  Location[facility];  Service: ENT;  Laterality: N/A;    Denies any headaches, dizziness, double vision, fevers, chills, night sweats, nausea, vomiting, diarrhea, constipation, chest pain, heart palpitations, shortness of breath, blood in stool, black tarry stool, urinary pain, urinary burning, urinary frequency, hematuria.   PHYSICAL EXAMINATION  ECOG PERFORMANCE STATUS: 1 - Symptomatic but completely ambulatory  Filed Vitals:   10/28/15 1301  BP: 144/74  Pulse: 75  Temp: 98.6 F (37 C)  Resp: 18    GENERAL:alert, no distress, well nourished, well developed, comfortable, cooperative and smiling SKIN: skin color, texture, turgor are normal, no rashes or significant lesions HEAD: Normocephalic, No masses, lesions, tenderness or abnormalities EYES: normal, PERRLA, EOMI, Conjunctiva are pink and non-injected EARS: External ears normal OROPHARYNX:mucous membranes are moist  NECK: supple, trachea midline LYMPH:  not examined BREAST: Large incision running along superior portion of the left breast, well healed. Left breast otherwise without abnormality. Right breast is without abnormality. Bilateral axillae without palpable abnormalities LUNGS: clear to auscultation  HEART: regular rate & rhythm, no gallops and systolic ejection murmur heard throughout the precordium ABDOMEN:abdomen soft, non-tender and normal bowel sounds BACK: Back symmetric, no curvature.  EXTREMITIES:less then 2 second capillary refill, no joint deformities, effusion, or inflammation, no edema, no skin discoloration, no clubbing, no cyanosis  NEURO: alert & oriented x 3 with fluent speech, no focal motor/sensory deficits, gait normal   LABORATORY DATA: I have  reviewed the data as listed. CBC    Component Value Date/Time   WBC 7.4 10/22/2014 1003   RBC 4.07 10/22/2014 1003   HGB 11.7* 10/22/2014 1003   HCT 36.6 10/22/2014 1003   PLT 217 10/22/2014 1003   MCV 89.9 10/22/2014 1003   MCH 28.7 10/22/2014 1003   MCHC 32.0 10/22/2014 1003   RDW 13.7 10/22/2014 1003   LYMPHSABS 2.6 10/22/2014 1003   MONOABS 0.5 10/22/2014 1003   EOSABS 0.2 10/22/2014 1003   BASOSABS 0.0 10/22/2014 1003      Chemistry      Component Value Date/Time   NA 141 10/19/2015 1224   K 3.6 10/19/2015 1224   CL 105 10/19/2015 1224   CO2 28 10/19/2015 1224   BUN 17 10/19/2015 1224   CREATININE 0.78 10/19/2015 1224      Component Value Date/Time   CALCIUM 9.3 10/19/2015 1224  CALCIUM 8.7 10/22/2014 1003   ALKPHOS 53 10/19/2015 1224   AST 23 10/19/2015 1224   ALT 14 10/19/2015 1224   BILITOT 0.4 10/19/2015 1224       Patient Active Problem List   Diagnosis Date Noted  . S/P parathyroidectomy 12/03/2013  . Neck mass 10/30/2013  . Diverticulitis 10/26/2013  . Generalized weakness 10/26/2013  . Acute diverticulitis 10/26/2013  . Infiltrating lobular carcinoma, left breast 04/04/2013  . Syncope and collapse 02/21/2012  . Chest pain 02/21/2012  . Dehydration 02/21/2012  . Acute renal failure ([place]) 02/21/2012  . HTN (hypertension) 02/21/2012  . Hypothyroid 02/21/2012  . GERD (gastroesophageal reflux disease) 02/21/2012  . Osteoporosis 09/08/2011  . FULL INCONTINENCE OF FECES 11/07/2010  . DIARRHEA 11/07/2010   ASSESSMENT/PLAN: Stage IIA, infiltrating lobular carcinoma diagnosed in 2003 Partial mastectomy 02/25/2002 ER+ PR+ Her-2 neu negative Adjuvant XRT Completed 5 years of AI therapy History of abnormal CT of the chest, last performed in 09/26/2013 Kidney stones  Her next mammogram is due December 2017. This was ordered today. Breast exam is unchanged and not suggestive of recurrence.  Her daughter has recently passed and this has been difficult,  but she is trying to remain involved and active.  She will return in 1 year for follow up.  THERAPY PLAN:  NCCN guidelines recommends the following surveillance for invasive breast cancer:  A. History and Physical exam every 4-6 months for 5 years and then every 12 months.  B. Mammography every 12 months  C. Women on Tamoxifen: annual gynecologic assessment every 12 months if uterus is present.  D. Women on aromatase inhibitor or who experience ovarian failure secondary to treatment should have monitoring of bone health with a bone mineral density determination at baseline and periodically thereafter.  E. Assess and encourage adherence to adjuvant endocrine therapy.  F. Evidence suggests that active lifestyle and achieving and maintaining an ideal body weight (20-25 BMI) may lead to optimal breast cancer outcomes.   All questions were answered. The patient knows to call the clinic with any problems, questions or concerns. We can certainly see the patient much sooner if necessary. This note was signed electronically. This document serves as a record of services personally performed by Ancil Linsey, MD. It was created on her behalf by Kandace Blitz, a trained medical scribe. The creation of this record is based on the scribe's personal observations and the provider's statements to them. This document has been checked and approved by the attending provider. I have reviewed the above documentation for accuracy and completeness, and I agree with the above. Molli Hazard, MD  MD

## 2015-12-01 DIAGNOSIS — M542 Cervicalgia: Secondary | ICD-10-CM | POA: Diagnosis not present

## 2015-12-01 DIAGNOSIS — J309 Allergic rhinitis, unspecified: Secondary | ICD-10-CM | POA: Diagnosis not present

## 2015-12-01 DIAGNOSIS — I10 Essential (primary) hypertension: Secondary | ICD-10-CM | POA: Diagnosis not present

## 2016-01-25 DIAGNOSIS — E782 Mixed hyperlipidemia: Secondary | ICD-10-CM | POA: Diagnosis not present

## 2016-01-25 DIAGNOSIS — G301 Alzheimer's disease with late onset: Secondary | ICD-10-CM | POA: Diagnosis not present

## 2016-01-25 DIAGNOSIS — I10 Essential (primary) hypertension: Secondary | ICD-10-CM | POA: Diagnosis not present

## 2016-01-25 DIAGNOSIS — M170 Bilateral primary osteoarthritis of knee: Secondary | ICD-10-CM | POA: Diagnosis not present

## 2016-01-25 DIAGNOSIS — K219 Gastro-esophageal reflux disease without esophagitis: Secondary | ICD-10-CM | POA: Diagnosis not present

## 2016-01-25 DIAGNOSIS — M810 Age-related osteoporosis without current pathological fracture: Secondary | ICD-10-CM | POA: Diagnosis not present

## 2016-01-25 DIAGNOSIS — F331 Major depressive disorder, recurrent, moderate: Secondary | ICD-10-CM | POA: Diagnosis not present

## 2016-01-25 DIAGNOSIS — E039 Hypothyroidism, unspecified: Secondary | ICD-10-CM | POA: Diagnosis not present

## 2016-04-07 DIAGNOSIS — Z6824 Body mass index (BMI) 24.0-24.9, adult: Secondary | ICD-10-CM | POA: Diagnosis not present

## 2016-04-07 DIAGNOSIS — L247 Irritant contact dermatitis due to plants, except food: Secondary | ICD-10-CM | POA: Diagnosis not present

## 2016-04-17 ENCOUNTER — Encounter (HOSPITAL_COMMUNITY): Payer: Medicare Other | Attending: Oncology

## 2016-04-17 ENCOUNTER — Encounter (HOSPITAL_COMMUNITY): Payer: Medicare Other

## 2016-04-17 VITALS — BP 127/66 | HR 67 | Temp 98.0°F | Resp 16

## 2016-04-17 DIAGNOSIS — C50912 Malignant neoplasm of unspecified site of left female breast: Secondary | ICD-10-CM

## 2016-04-17 DIAGNOSIS — M810 Age-related osteoporosis without current pathological fracture: Secondary | ICD-10-CM | POA: Diagnosis present

## 2016-04-17 DIAGNOSIS — Z853 Personal history of malignant neoplasm of breast: Secondary | ICD-10-CM | POA: Diagnosis not present

## 2016-04-17 LAB — COMPREHENSIVE METABOLIC PANEL
ALBUMIN: 3.6 g/dL (ref 3.5–5.0)
ALK PHOS: 60 U/L (ref 38–126)
ALT: 15 U/L (ref 14–54)
ANION GAP: 9 (ref 5–15)
AST: 22 U/L (ref 15–41)
BILIRUBIN TOTAL: 0.5 mg/dL (ref 0.3–1.2)
BUN: 24 mg/dL — AB (ref 6–20)
CALCIUM: 9.2 mg/dL (ref 8.9–10.3)
CO2: 27 mmol/L (ref 22–32)
CREATININE: 0.94 mg/dL (ref 0.44–1.00)
Chloride: 102 mmol/L (ref 101–111)
GFR calc Af Amer: >60 mL/min (ref >60)
GFR calc non Af Amer: 57 mL/min — ABNORMAL LOW (ref >60)
GLUCOSE: 86 mg/dL (ref 65–99)
Potassium: 3.5 mmol/L (ref 3.5–5.1)
SODIUM: 138 mmol/L (ref 135–145)
TOTAL PROTEIN: 7.4 g/dL (ref 6.5–8.1)

## 2016-04-17 MED ORDER — DENOSUMAB 60 MG/ML ~~LOC~~ SOLN
60.0000 mg | Freq: Once | SUBCUTANEOUS | Status: AC
  Administered 2016-04-17: 60 mg via SUBCUTANEOUS
  Filled 2016-04-17: qty 1

## 2016-04-17 NOTE — Progress Notes (Signed)
[name] presents today for injection per MD orders. Prolia administered SQ in right Abdomen. Administration without incident. Patient tolerated well.

## 2016-04-17 NOTE — Patient Instructions (Signed)
[facility] at [facility] Discharge Instructions  RECOMMENDATIONS MADE BY THE CONSULTANT AND ANY TEST RESULTS WILL BE SENT TO YOUR REFERRING PHYSICIAN.  Prolia today.    Thank you for choosing [facility] at [facility] to provide your oncology and hematology care.  To afford each patient quality time with our provider, please arrive at least 15 minutes before your scheduled appointment time.   Beginning January 23rd 2017 lab work for the Cancer Center will be done in the  Main lab at [place] on 1st floor. If you have a lab appointment with the Cancer Center please come in thru the  Main Entrance and check in at the main information desk  You need to re-schedule your appointment should you arrive 10 or more minutes late.  We strive to give you quality time with our providers, and arriving late affects you and other patients whose appointments are after yours.  Also, if you no show three or more times for appointments you may be dismissed from the clinic at the providers discretion.     Again, thank you for choosing [facility].  Our hope is that these requests will decrease the amount of time that you wait before being seen by our physicians.       _____________________________________________________________  Should you have questions after your visit to [facility], please contact our office at [phone] between the hours of 8:30 a.m. and 4:30 p.m.  Voicemails left after 4:30 p.m. will not be returned until the following business day.  For prescription refill requests, have your pharmacy contact our office.         Resources For Cancer Patients and their Caregivers ? American Cancer Society: Can assist with transportation, wigs, general needs, runs Look Good Feel Better.        1-888-227-6333 ? Cancer Care: Provides financial assistance, online support groups, medication/co-pay assistance.  1-800-813-HOPE  (4673) ? [facility] Assists [place] cancer patients and their families through emotional , educational and financial support.  336-427-4357 ? [place] DSS Where to apply for food stamps, Medicaid and utility assistance. 336-342-1394 ? [facility]: Transportation to medical appointments. 336-347-2287 ? Social Security Administration: May apply for disability if have a Stage IV cancer. 336-342-7796 1-800-772-1213 ? [place] Aging, Disability and Transit Services: Assists with nutrition, care and transit needs. 336-349-2343  Cancer Center Support Programs: @10RELATIVEDAYS@ > Cancer Support Group  2nd Tuesday of the month 1pm-2pm, Journey Room  > Creative Journey  3rd Tuesday of the month 1130am-1pm, Journey Room  > Look Good Feel Better  1st Wednesday of the month 10am-12 noon, Journey Room (Call American Cancer Society to register 1-800-395-5775)    

## 2016-05-19 DIAGNOSIS — E559 Vitamin D deficiency, unspecified: Secondary | ICD-10-CM | POA: Diagnosis not present

## 2016-05-19 DIAGNOSIS — D529 Folate deficiency anemia, unspecified: Secondary | ICD-10-CM | POA: Diagnosis not present

## 2016-05-19 DIAGNOSIS — K219 Gastro-esophageal reflux disease without esophagitis: Secondary | ICD-10-CM | POA: Diagnosis not present

## 2016-05-19 DIAGNOSIS — E782 Mixed hyperlipidemia: Secondary | ICD-10-CM | POA: Diagnosis not present

## 2016-05-19 DIAGNOSIS — D519 Vitamin B12 deficiency anemia, unspecified: Secondary | ICD-10-CM | POA: Diagnosis not present

## 2016-05-19 DIAGNOSIS — D509 Iron deficiency anemia, unspecified: Secondary | ICD-10-CM | POA: Diagnosis not present

## 2016-05-19 DIAGNOSIS — E039 Hypothyroidism, unspecified: Secondary | ICD-10-CM | POA: Diagnosis not present

## 2016-05-19 DIAGNOSIS — I10 Essential (primary) hypertension: Secondary | ICD-10-CM | POA: Diagnosis not present

## 2016-05-24 DIAGNOSIS — E039 Hypothyroidism, unspecified: Secondary | ICD-10-CM | POA: Diagnosis not present

## 2016-05-24 DIAGNOSIS — Z6824 Body mass index (BMI) 24.0-24.9, adult: Secondary | ICD-10-CM | POA: Diagnosis not present

## 2016-05-24 DIAGNOSIS — I10 Essential (primary) hypertension: Secondary | ICD-10-CM | POA: Diagnosis not present

## 2016-05-24 DIAGNOSIS — F331 Major depressive disorder, recurrent, moderate: Secondary | ICD-10-CM | POA: Diagnosis not present

## 2016-05-24 DIAGNOSIS — Z1212 Encounter for screening for malignant neoplasm of rectum: Secondary | ICD-10-CM | POA: Diagnosis not present

## 2016-05-24 DIAGNOSIS — E782 Mixed hyperlipidemia: Secondary | ICD-10-CM | POA: Diagnosis not present

## 2016-05-24 DIAGNOSIS — G301 Alzheimer's disease with late onset: Secondary | ICD-10-CM | POA: Diagnosis not present

## 2016-05-24 DIAGNOSIS — Z23 Encounter for immunization: Secondary | ICD-10-CM | POA: Diagnosis not present

## 2016-06-19 ENCOUNTER — Ambulatory Visit (INDEPENDENT_AMBULATORY_CARE_PROVIDER_SITE_OTHER): Payer: Medicare Other | Admitting: Otolaryngology

## 2016-06-19 DIAGNOSIS — H903 Sensorineural hearing loss, bilateral: Secondary | ICD-10-CM

## 2016-07-31 ENCOUNTER — Ambulatory Visit (INDEPENDENT_AMBULATORY_CARE_PROVIDER_SITE_OTHER): Payer: Medicare Other | Admitting: Otolaryngology

## 2016-07-31 DIAGNOSIS — J310 Chronic rhinitis: Secondary | ICD-10-CM

## 2016-07-31 DIAGNOSIS — H6983 Other specified disorders of Eustachian tube, bilateral: Secondary | ICD-10-CM | POA: Diagnosis not present

## 2016-08-10 ENCOUNTER — Ambulatory Visit (HOSPITAL_COMMUNITY): Payer: Medicare Other

## 2016-08-23 ENCOUNTER — Encounter (HOSPITAL_COMMUNITY): Payer: Self-pay | Admitting: Emergency Medicine

## 2016-08-23 ENCOUNTER — Emergency Department (HOSPITAL_COMMUNITY)
Admission: EM | Admit: 2016-08-23 | Discharge: 2016-08-23 | Disposition: A | Discharge status: Home / Self Care | Payer: Medicare Other | Attending: Emergency Medicine | Admitting: Emergency Medicine

## 2016-08-23 ENCOUNTER — Emergency Department (HOSPITAL_COMMUNITY): Payer: Medicare Other

## 2016-08-23 DIAGNOSIS — M542 Cervicalgia: Secondary | ICD-10-CM | POA: Diagnosis not present

## 2016-08-23 DIAGNOSIS — M25512 Pain in left shoulder: Secondary | ICD-10-CM | POA: Insufficient documentation

## 2016-08-23 DIAGNOSIS — Z79899 Other long term (current) drug therapy: Secondary | ICD-10-CM | POA: Insufficient documentation

## 2016-08-23 DIAGNOSIS — I10 Essential (primary) hypertension: Secondary | ICD-10-CM | POA: Insufficient documentation

## 2016-08-23 DIAGNOSIS — R195 Other fecal abnormalities: Secondary | ICD-10-CM | POA: Diagnosis not present

## 2016-08-23 DIAGNOSIS — E039 Hypothyroidism, unspecified: Secondary | ICD-10-CM | POA: Diagnosis not present

## 2016-08-23 DIAGNOSIS — D509 Iron deficiency anemia, unspecified: Secondary | ICD-10-CM

## 2016-08-23 DIAGNOSIS — Z853 Personal history of malignant neoplasm of breast: Secondary | ICD-10-CM | POA: Diagnosis not present

## 2016-08-23 DIAGNOSIS — N3001 Acute cystitis with hematuria: Secondary | ICD-10-CM | POA: Diagnosis not present

## 2016-08-23 DIAGNOSIS — M545 Low back pain: Secondary | ICD-10-CM | POA: Insufficient documentation

## 2016-08-23 DIAGNOSIS — R51 Headache: Secondary | ICD-10-CM | POA: Diagnosis present

## 2016-08-23 LAB — BASIC METABOLIC PANEL
Anion gap: 8 (ref 5–15)
BUN: 22 mg/dL — AB (ref 6–20)
CHLORIDE: 102 mmol/L (ref 101–111)
CO2: 28 mmol/L (ref 22–32)
Calcium: 8.9 mg/dL (ref 8.9–10.3)
Creatinine, Ser: 0.93 mg/dL (ref 0.44–1.00)
GFR calc Af Amer: >60 mL/min (ref >60)
GFR, EST NON AFRICAN AMERICAN: 57 mL/min — AB (ref >60)
GLUCOSE: 93 mg/dL (ref 65–99)
POTASSIUM: 3.5 mmol/L (ref 3.5–5.1)
Sodium: 138 mmol/L (ref 135–145)

## 2016-08-23 LAB — CBC WITH DIFFERENTIAL/PLATELET
Basophils Absolute: 0 10*3/uL (ref 0.0–0.1)
Basophils Relative: 0 %
EOS PCT: 3 %
Eosinophils Absolute: 0.3 10*3/uL (ref 0.0–0.7)
HCT: 25.7 % — ABNORMAL LOW (ref 36.0–46.0)
Hemoglobin: 8.2 g/dL — ABNORMAL LOW (ref 12.0–15.0)
LYMPHS ABS: 1.9 10*3/uL (ref 0.7–4.0)
LYMPHS PCT: 17 %
MCH: 27.6 pg (ref 26.0–34.0)
MCHC: 31.9 g/dL (ref 30.0–36.0)
MCV: 86.5 fL (ref 78.0–100.0)
Monocytes Absolute: 0.8 10*3/uL (ref 0.1–1.0)
Monocytes Relative: 7 %
Neutro Abs: 8.2 10*3/uL — ABNORMAL HIGH (ref 1.7–7.7)
Neutrophils Relative %: 73 %
PLATELETS: 311 10*3/uL (ref 150–400)
RBC: 2.97 MIL/uL — AB (ref 3.87–5.11)
RDW: 15.1 % (ref 11.5–15.5)
WBC: 11.3 10*3/uL — AB (ref 4.0–10.5)

## 2016-08-23 LAB — URINALYSIS, ROUTINE W REFLEX MICROSCOPIC
Bilirubin Urine: NEGATIVE
Bilirubin Urine: NEGATIVE
GLUCOSE, UA: NEGATIVE mg/dL
Glucose, UA: NEGATIVE mg/dL
Ketones, ur: NEGATIVE mg/dL
Ketones, ur: NEGATIVE mg/dL
NITRITE: POSITIVE — AB
Nitrite: POSITIVE — AB
PH: 7 (ref 5.0–8.0)
PROTEIN: NEGATIVE mg/dL
Protein, ur: NEGATIVE mg/dL
SPECIFIC GRAVITY, URINE: 1.01 (ref 1.005–1.030)
Specific Gravity, Urine: 1.01 (ref 1.005–1.030)
pH: 6.5 (ref 5.0–8.0)

## 2016-08-23 LAB — URINALYSIS, MICROSCOPIC (REFLEX)

## 2016-08-23 LAB — TROPONIN I: Troponin I: <0.03 ng/mL (ref <0.03)

## 2016-08-23 LAB — POC OCCULT BLOOD, ED: FECAL OCCULT BLD: POSITIVE — AB

## 2016-08-23 MED ORDER — ACETAMINOPHEN 325 MG PO TABS
650.0000 mg | ORAL_TABLET | Freq: Once | ORAL | Status: AC
  Administered 2016-08-23: 650 mg via ORAL
  Filled 2016-08-23: qty 2

## 2016-08-23 MED ORDER — ONDANSETRON 8 MG PO TBDP
8.0000 mg | ORAL_TABLET | Freq: Once | ORAL | Status: AC
  Administered 2016-08-23: 8 mg via ORAL
  Filled 2016-08-23: qty 1

## 2016-08-23 MED ORDER — MORPHINE SULFATE (PF) 2 MG/ML IV SOLN
2.0000 mg | Freq: Once | INTRAVENOUS | Status: AC
  Administered 2016-08-23: 2 mg via INTRAVENOUS
  Filled 2016-08-23: qty 1

## 2016-08-23 MED ORDER — PROMETHAZINE HCL 25 MG/ML IJ SOLN
12.5000 mg | Freq: Once | INTRAMUSCULAR | Status: AC
  Administered 2016-08-23: 12.5 mg via INTRAVENOUS
  Filled 2016-08-23: qty 1

## 2016-08-23 MED ORDER — SULFAMETHOXAZOLE-TRIMETHOPRIM 800-160 MG PO TABS: 1.0000 | ORAL_TABLET | Freq: Two times a day (BID) | ORAL | 0 refills | Status: AC

## 2016-08-23 NOTE — ED Notes (Signed)
Pt positive for occult blood. Pt also reports she has 3 small sores on labia that she would like the doctor to assess.  Notified Evalee Jefferson PA.

## 2016-08-23 NOTE — ED Notes (Signed)
Awaiting d/c papers. 

## 2016-08-23 NOTE — ED Notes (Signed)
edp in wit pt 

## 2016-08-23 NOTE — ED Provider Notes (Signed)
Medical screening examination/treatment/procedure(s) were conducted as a shared visit with non-physician practitioner(s) and myself.  I personally evaluated the patient during the encounter.   EKG Interpretation  Date/Time:  Wednesday August 23 2016 08:04:38 EST Ventricular Rate:  88 PR Interval:    QRS Duration: 83 QT Interval:  367 QTC Calculation: 444 R Axis:   -13 Text Interpretation:  Sinus rhythm Ventricular premature complex Low voltage, precordial leads Confirmed by Talishia Betzler  MD, Rolonda Pontarelli [phone]) on 08/23/2016 8:16:29 AM      Results for orders placed or performed during the hospital encounter of 08/23/16  CBC with Differential  Result Value Ref Range   WBC 11.3 (H) 4.0 - 10.5 K/uL   RBC 2.97 (L) 3.87 - 5.11 MIL/uL   Hemoglobin 8.2 (L) 12.0 - 15.0 g/dL   HCT 25.7 (L) 36.0 - 46.0 %   MCV 86.5 78.0 - 100.0 fL   MCH 27.6 26.0 - 34.0 pg   MCHC 31.9 30.0 - 36.0 g/dL   RDW 15.1 11.5 - 15.5 %   Platelets 311 150 - 400 K/uL   Neutrophils Relative % 73 %   Neutro Abs 8.2 (H) 1.7 - 7.7 K/uL   Lymphocytes Relative 17 %   Lymphs Abs 1.9 0.7 - 4.0 K/uL   Monocytes Relative 7 %   Monocytes Absolute 0.8 0.1 - 1.0 K/uL   Eosinophils Relative 3 %   Eosinophils Absolute 0.3 0.0 - 0.7 K/uL   Basophils Relative 0 %   Basophils Absolute 0.0 0.0 - 0.1 K/uL  Basic metabolic panel  Result Value Ref Range   Sodium 138 135 - 145 mmol/L   Potassium 3.5 3.5 - 5.1 mmol/L   Chloride 102 101 - 111 mmol/L   CO2 28 22 - 32 mmol/L   Glucose, Bld 93 65 - 99 mg/dL   BUN 22 (H) 6 - 20 mg/dL   Creatinine, Ser 0.93 0.44 - 1.00 mg/dL   Calcium 8.9 8.9 - 10.3 mg/dL   GFR calc non Af Amer 57 (L) >60 mL/min   GFR calc Af Amer >60 >60 mL/min   Anion gap 8 5 - 15  Urinalysis, Routine w reflex microscopic  Result Value Ref Range   Color, Urine YELLOW YELLOW   APPearance HAZY (A) CLEAR   Specific Gravity, Urine 1.010 1.005 - 1.030   pH 7.0 5.0 - 8.0   Glucose, UA NEGATIVE NEGATIVE mg/dL   Hgb urine  dipstick MODERATE (A) NEGATIVE   Bilirubin Urine NEGATIVE NEGATIVE   Ketones, ur NEGATIVE NEGATIVE mg/dL   Protein, ur NEGATIVE NEGATIVE mg/dL   Nitrite POSITIVE (A) NEGATIVE   Leukocytes, UA TRACE (A) NEGATIVE  Urinalysis, Microscopic (reflex)  Result Value Ref Range   RBC / HPF TOO NUMEROUS TO COUNT 0 - 5 RBC/hpf   WBC, UA 6-30 0 - 5 WBC/hpf   Bacteria, UA MANY (A) NONE SEEN   Squamous Epithelial / LPF TOO NUMEROUS TO COUNT (A) NONE SEEN  Urinalysis, Routine w reflex microscopic  Result Value Ref Range   Color, Urine YELLOW YELLOW   APPearance CLEAR CLEAR   Specific Gravity, Urine 1.010 1.005 - 1.030   pH 6.5 5.0 - 8.0   Glucose, UA NEGATIVE NEGATIVE mg/dL   Hgb urine dipstick MODERATE (A) NEGATIVE   Bilirubin Urine NEGATIVE NEGATIVE   Ketones, ur NEGATIVE NEGATIVE mg/dL   Protein, ur NEGATIVE NEGATIVE mg/dL   Nitrite POSITIVE (A) NEGATIVE   Leukocytes, UA TRACE (A) NEGATIVE  Troponin I  Result Value Ref Range  Troponin I <0.03 <0.03 ng/mL  Urinalysis, Microscopic (reflex)  Result Value Ref Range   RBC / HPF TOO NUMEROUS TO COUNT 0 - 5 RBC/hpf   WBC, UA 0-5 0 - 5 WBC/hpf   Bacteria, UA MANY (A) NONE SEEN   Squamous Epithelial / LPF 0-5 (A) NONE SEEN  POC occult blood, ED  Result Value Ref Range   Fecal Occult Bld POSITIVE (A) NEGATIVE   Ct Head Wo Contrast  Result Date: 08/23/2016 CLINICAL DATA:  Headache, left side neck pain starting 08/20/2016 no known injury EXAM: CT HEAD WITHOUT CONTRAST CT CERVICAL SPINE WITHOUT CONTRAST TECHNIQUE: Multidetector CT imaging of the head and cervical spine was performed following the standard protocol without intravenous contrast. Multiplanar CT image reconstructions of the cervical spine were also generated. COMPARISON:  02/21/2012 FINDINGS: CT HEAD FINDINGS Brain: No intracranial hemorrhage, mass effect or midline shift. Mild cerebral atrophy. Mild periventricular chronic white matter disease. No acute cortical infarction. No mass  lesion is noted on this unenhanced scan. Vascular: Extensive atherosclerotic calcifications of vertebral arteries. Atherosclerotic calcifications of carotid siphon. Skull: No skull fracture. Sinuses/Orbits: No acute findings. Other: None CT CERVICAL SPINE FINDINGS Alignment: There is normal alignment. Skull base and vertebrae: No acute fracture or subluxation. Degenerative changes are noted C1-C2 articulation. Mild posterior spurring at C5-C6 and C6-C7 level. Soft tissues and spinal canal: No prevertebral soft tissue swelling. Cervical airway is patent. Minimal spinal canal stenosis due to posterior spurring at C5-C6 and C6-C7 level. Disc levels:  Mild disc space flattening at C5-C6 and C6-C7 level. Upper chest: There is no pneumothorax in visualized lung apices. Other: None IMPRESSION: 1. No acute intracranial abnormality. 2. Mild cerebral atrophy. Mild periventricular chronic white matter disease. No acute cortical infarction. 3. Extensive atherosclerotic calcifications of vertebral arteries and carotid siphon. 4. No cervical spine acute fracture or subluxation. Degenerative changes as described above. Electronically Signed   By: Lahoma Crocker M.D.   On: 08/23/2016 11:00   Ct Cervical Spine Wo Contrast  Result Date: 08/23/2016 CLINICAL DATA:  Headache, left side neck pain starting 08/20/2016 no known injury EXAM: CT HEAD WITHOUT CONTRAST CT CERVICAL SPINE WITHOUT CONTRAST TECHNIQUE: Multidetector CT imaging of the head and cervical spine was performed following the standard protocol without intravenous contrast. Multiplanar CT image reconstructions of the cervical spine were also generated. COMPARISON:  02/21/2012 FINDINGS: CT HEAD FINDINGS Brain: No intracranial hemorrhage, mass effect or midline shift. Mild cerebral atrophy. Mild periventricular chronic white matter disease. No acute cortical infarction. No mass lesion is noted on this unenhanced scan. Vascular: Extensive atherosclerotic calcifications of  vertebral arteries. Atherosclerotic calcifications of carotid siphon. Skull: No skull fracture. Sinuses/Orbits: No acute findings. Other: None CT CERVICAL SPINE FINDINGS Alignment: There is normal alignment. Skull base and vertebrae: No acute fracture or subluxation. Degenerative changes are noted C1-C2 articulation. Mild posterior spurring at C5-C6 and C6-C7 level. Soft tissues and spinal canal: No prevertebral soft tissue swelling. Cervical airway is patent. Minimal spinal canal stenosis due to posterior spurring at C5-C6 and C6-C7 level. Disc levels:  Mild disc space flattening at C5-C6 and C6-C7 level. Upper chest: There is no pneumothorax in visualized lung apices. Other: None IMPRESSION: 1. No acute intracranial abnormality. 2. Mild cerebral atrophy. Mild periventricular chronic white matter disease. No acute cortical infarction. 3. Extensive atherosclerotic calcifications of vertebral arteries and carotid siphon. 4. No cervical spine acute fracture or subluxation. Degenerative changes as described above. Electronically Signed   By: Lahoma Crocker M.D.   On: 08/23/2016  11:00    Patient symptoms may very well be related to the upper respiratory infection and sinus congestion that she has. Head CT negative. However to other significant findings. Patient has a fairly significant anemia. Patient has red blood cells in her urine. Would recommend close follow-up with primary care doctor regarding the anemia. Will treat the hematuria with antibiotics and urine culture being sent. Will need close follow-up to make sure that the urine returns to normal. If not patient may require CT scan of abdomen to take a look at renal function. If hemoglobin drops any further patient will be needing blood transfusion. Patient's stool was heme positive but not grossly bloody. Patients states she feels better and once to go home.  Patient clinically probably can go home.   Fredia Sorrow, MD 08/23/16 [phone]

## 2016-08-23 NOTE — Discharge Instructions (Signed)
Take your entire course of antibiotics.  You will need  repeat blood work to follow your anemia and your urinary infection.  Call your doctor for a recheck of your symptoms and these labs next week.

## 2016-08-23 NOTE — ED Triage Notes (Signed)
Patient complaining of left arm pain, headache, left neck pain, back pain, and numbness to left leg since yesterday. Also complaining of sudden onset of nausea upon arrival to ER. Denies injury.

## 2016-08-23 NOTE — ED Notes (Signed)
Pt returned from ct

## 2016-08-23 NOTE — ED Provider Notes (Signed)
[facility] DEPT Provider Note   CSN: [id number] Arrival date & time: 08/23/16  [id number]     History   Chief Complaint Chief Complaint  Patient presents with  . Arm Pain    HPI [name] is a 81 y.o. female presenting with a 24 hour history of chills without documented fevers, body aches, headache and new onset of nausea this morning without emesis, diarrhea or abdominal pain.  Additionally she denies chest pain or shortness of breath.  She reports she has a history of frequent headaches and currently has a "head cold" with nasal congestion without rhinorrhea so cannot say if her current headache is similar to prior chronic headache symptoms.  She endorses pain in her neck and left shoulder which is worsened with movement, also endorses tingling in her bilateral feet and a numb sensation in the left leg but states this symptom is also chronic related to her prior knee surgery.  She has taken bc powders for the headache and it improved, but returned last night and did not respond to her aleve.  The history is provided by the patient.    Past Medical History:  Diagnosis Date  . Breast cancer ([place]) 2003  . Diverticulitis    History  . Dyslexia   . GERD (gastroesophageal reflux disease)    takes Nexium daily  . H/O hiatal hernia   . Headache(784.0)    all the time  . History of esophageal spasm   . History of IBS    no problem in past yr  . History of kidney stones   . History of shingles   . HTN (hypertension)    takes Diovan daily  . Hyperlipidemia    takes Pravastatin daily  . Hypokalemia   . Hypothyroidism    takes Synthroid daily  . Impaired hearing   . Infiltrating lobular carcinoma ([place]) 04/04/2013  . Joint pain   . Joint swelling   . Neck pain    pinched   . Osteoarthritis   . Osteoporosis 09/08/2011  . PONV (postoperative nausea and vomiting)   . Syncope, non cardiac   . Weakness    numbness left arm    Patient Active Problem List   Diagnosis Date  Noted  . S/P parathyroidectomy ([place]) 12/03/2013  . Neck mass 10/30/2013  . Diverticulitis 10/26/2013  . Generalized weakness 10/26/2013  . Acute diverticulitis 10/26/2013  . Infiltrating lobular carcinoma, left breast 04/04/2013  . Syncope and collapse 02/21/2012  . Chest pain 02/21/2012  . Dehydration 02/21/2012  . Acute renal failure ([place]) 02/21/2012  . HTN (hypertension) 02/21/2012  . Hypothyroid 02/21/2012  . GERD (gastroesophageal reflux disease) 02/21/2012  . Osteoporosis 09/08/2011  . FULL INCONTINENCE OF FECES 11/07/2010  . DIARRHEA 11/07/2010    Past Surgical History:  Procedure Laterality Date  . Arm Fx     left  . BREAST LUMPECTOMY     Left  . CARDIAC CATHETERIZATION     x 3-last one in the 90's per pt  . CATARACT EXTRACTION    . CATARACT EXTRACTION W/PHACO  10/02/2011   Procedure: CATARACT EXTRACTION PHACO AND INTRAOCULAR LENS PLACEMENT (IOC);  Surgeon: [name], MD;  Location: [facility];  Service: Ophthalmology;  Laterality: Right;  CDE:17.01  . CHOLECYSTECTOMY  1997   Status post -stones  . COLONOSCOPY    . EGD with dilitation    . kidney stent  06/04/11   removed after about 3 weeks.  Marland Kitchen [place]  .  KNEE SURGERY     bilateral  . LAPAROSCOPIC NISSEN FUNDOPLICATION  0000000  . LITHOTRIPSY  11/12  . MANDIBLE RECONSTRUCTION    . NECK SURGERY  2000   [facility]  . NISSEN FUNDOPLICATION    . PARATHYROIDECTOMY  12/03/2013   DR TEOH  . SHOULDER SURGERY Left   . THYROIDECTOMY     Hypothyroidism status post  . THYROIDECTOMY N/A 12/03/2013   Procedure: PARATHYROIDECTOMY;  Surgeon: [name], MD;  Location: [facility];  Service: ENT;  Laterality: N/A;  . TUBAL LIGATION    . WRIST SURGERY Left 2009    OB History    No data available       Home Medications    Prior to Admission medications   Medication Sig Start Date End Date Taking? Authorizing Provider  CALCIUM PO Take 2 tablets by mouth daily.   Yes Historical Provider, MD  Cyanocobalamin (VITAMIN  B-12) 2500 MCG SUBL Place 1 tablet under the tongue daily.   Yes Historical Provider, MD  levothyroxine (SYNTHROID, LEVOTHROID) 88 MCG tablet Take 88 mcg by mouth daily before breakfast.   Yes Historical Provider, MD  Multiple Vitamins-Minerals (CENTRUM SILVER ADULT 50+ PO) Take 1 capsule by mouth daily.   Yes Historical Provider, MD  Naproxen Sodium (ALEVE) 220 MG CAPS Take 440 mg by mouth 2 (two) times daily as needed (pain).    Yes Historical Provider, MD  pantoprazole (PROTONIX) 40 MG tablet Take 40 mg by mouth daily.  07/06/16  Yes Historical Provider, MD  pravastatin (PRAVACHOL) 20 MG tablet Take 20 mg by mouth daily.   Yes Historical Provider, MD  valsartan-hydrochlorothiazide (DIOVAN-HCT) 160-12.5 MG tablet Take 1 tablet by mouth daily. Reported on 10/28/2015 10/27/15  Yes Historical Provider, MD  sulfamethoxazole-trimethoprim (BACTRIM DS,SEPTRA DS) 800-160 MG tablet Take 1 tablet by mouth 2 (two) times daily. 08/23/16 08/30/16  Evalee Jefferson, PA-C    Family History Family History  Problem Relation Age of Onset  . Macular degeneration Sister   . Anesthesia problems Neg Hx   . Hypotension Neg Hx   . Pseudochol deficiency Neg Hx   . Malignant hyperthermia Neg Hx     Social History Social History  Substance Use Topics  . Smoking status: Never Smoker  . Smokeless tobacco: Never Used  . Alcohol use No     Comment: occasionally     Allergies   Metronidazole; Oxycodone; Ciprofloxacin; and Latex   Review of Systems Review of Systems  Constitutional: Positive for chills. Negative for fever.  HENT: Positive for congestion. Negative for sore throat.   Eyes: Negative.   Respiratory: Negative for chest tightness and shortness of breath.   Cardiovascular: Negative for chest pain and palpitations.  Gastrointestinal: Positive for nausea. Negative for abdominal pain, constipation, diarrhea and vomiting.  Genitourinary: Negative.   Musculoskeletal: Positive for arthralgias and myalgias.  Negative for joint swelling and neck pain.  Skin: Negative.  Negative for rash and wound.  Neurological: Positive for numbness and headaches. Negative for dizziness, syncope, facial asymmetry, weakness and light-headedness.  Psychiatric/Behavioral: Negative.      Physical Exam Updated Vital Signs BP 101/77   Pulse 62   Temp 98.2 F (36.8 C) (Oral)   Resp 17   SpO2 99%   Physical Exam  Constitutional: She appears well-developed and well-nourished.  HENT:  Head: Normocephalic and atraumatic.  Eyes: Conjunctivae are normal.  Neck: Normal range of motion.  Cardiovascular: Normal rate, regular rhythm, normal heart sounds and intact distal pulses.  Pulmonary/Chest: Effort normal and breath sounds normal. She has no wheezes.  Abdominal: Soft. Bowel sounds are normal. There is no tenderness.  Musculoskeletal: Normal range of motion.  Neurological: She is alert. She has normal strength. She displays normal reflexes. No cranial nerve deficit. She exhibits normal muscle tone. She displays no Babinski's sign on the right side. She displays no Babinski's sign on the left side.  Reflex Scores:      Bicep reflexes are 2+ on the right side and 2+ on the left side. Decreased sensation to fine touch left lower leg. Strength normal,  Equal grip strength, normal bilateral SLR.    Skin: Skin is warm and dry.  Psychiatric: She has a normal mood and affect.  Nursing note and vitals reviewed.    ED Treatments / Results  Labs (all labs ordered are listed, but only abnormal results are displayed) Labs Reviewed  CBC WITH DIFFERENTIAL/PLATELET - Abnormal; Notable for the following:       Result Value   WBC 11.3 (*)    RBC 2.97 (*)    Hemoglobin 8.2 (*)    HCT 25.7 (*)    Neutro Abs 8.2 (*)    All other components within normal limits  BASIC METABOLIC PANEL - Abnormal; Notable for the following:    BUN 22 (*)    GFR calc non Af Amer 57 (*)    All other components within normal limits    URINALYSIS, ROUTINE W REFLEX MICROSCOPIC - Abnormal; Notable for the following:    APPearance HAZY (*)    Hgb urine dipstick MODERATE (*)    Nitrite POSITIVE (*)    Leukocytes, UA TRACE (*)    All other components within normal limits  URINALYSIS, MICROSCOPIC (REFLEX) - Abnormal; Notable for the following:    Bacteria, UA MANY (*)    Squamous Epithelial / LPF TOO NUMEROUS TO COUNT (*)    All other components within normal limits  URINALYSIS, ROUTINE W REFLEX MICROSCOPIC - Abnormal; Notable for the following:    Hgb urine dipstick MODERATE (*)    Nitrite POSITIVE (*)    Leukocytes, UA TRACE (*)    All other components within normal limits  URINALYSIS, MICROSCOPIC (REFLEX) - Abnormal; Notable for the following:    Bacteria, UA MANY (*)    Squamous Epithelial / LPF 0-5 (*)    All other components within normal limits  POC OCCULT BLOOD, ED - Abnormal; Notable for the following:    Fecal Occult Bld POSITIVE (*)    All other components within normal limits  URINE CULTURE  TROPONIN I    EKG  EKG Interpretation  Date/Time:  Wednesday August 23 2016 08:04:38 EST Ventricular Rate:  88 PR Interval:    QRS Duration: 83 QT Interval:  367 QTC Calculation: 444 R Axis:   -13 Text Interpretation:  Sinus rhythm Ventricular premature complex Low voltage, precordial leads Confirmed by ZACKOWSKI  MD, SCOTT ([id number]) on 08/23/2016 8:16:29 AM       Radiology Ct Head Wo Contrast  Result Date: 08/23/2016 CLINICAL DATA:  Headache, left side neck pain starting 08/20/2016 no known injury EXAM: CT HEAD WITHOUT CONTRAST CT CERVICAL SPINE WITHOUT CONTRAST TECHNIQUE: Multidetector CT imaging of the head and cervical spine was performed following the standard protocol without intravenous contrast. Multiplanar CT image reconstructions of the cervical spine were also generated. COMPARISON:  02/21/2012 FINDINGS: CT HEAD FINDINGS Brain: No intracranial hemorrhage, mass effect or midline shift. Mild  cerebral atrophy. Mild periventricular chronic white matter  disease. No acute cortical infarction. No mass lesion is noted on this unenhanced scan. Vascular: Extensive atherosclerotic calcifications of vertebral arteries. Atherosclerotic calcifications of carotid siphon. Skull: No skull fracture. Sinuses/Orbits: No acute findings. Other: None CT CERVICAL SPINE FINDINGS Alignment: There is normal alignment. Skull base and vertebrae: No acute fracture or subluxation. Degenerative changes are noted C1-C2 articulation. Mild posterior spurring at C5-C6 and C6-C7 level. Soft tissues and spinal canal: No prevertebral soft tissue swelling. Cervical airway is patent. Minimal spinal canal stenosis due to posterior spurring at C5-C6 and C6-C7 level. Disc levels:  Mild disc space flattening at C5-C6 and C6-C7 level. Upper chest: There is no pneumothorax in visualized lung apices. Other: None IMPRESSION: 1. No acute intracranial abnormality. 2. Mild cerebral atrophy. Mild periventricular chronic white matter disease. No acute cortical infarction. 3. Extensive atherosclerotic calcifications of vertebral arteries and carotid siphon. 4. No cervical spine acute fracture or subluxation. Degenerative changes as described above. Electronically Signed   By: Lahoma Crocker M.D.   On: 08/23/2016 11:00   Ct Cervical Spine Wo Contrast  Result Date: 08/23/2016 CLINICAL DATA:  Headache, left side neck pain starting 08/20/2016 no known injury EXAM: CT HEAD WITHOUT CONTRAST CT CERVICAL SPINE WITHOUT CONTRAST TECHNIQUE: Multidetector CT imaging of the head and cervical spine was performed following the standard protocol without intravenous contrast. Multiplanar CT image reconstructions of the cervical spine were also generated. COMPARISON:  02/21/2012 FINDINGS: CT HEAD FINDINGS Brain: No intracranial hemorrhage, mass effect or midline shift. Mild cerebral atrophy. Mild periventricular chronic white matter disease. No acute cortical infarction.  No mass lesion is noted on this unenhanced scan. Vascular: Extensive atherosclerotic calcifications of vertebral arteries. Atherosclerotic calcifications of carotid siphon. Skull: No skull fracture. Sinuses/Orbits: No acute findings. Other: None CT CERVICAL SPINE FINDINGS Alignment: There is normal alignment. Skull base and vertebrae: No acute fracture or subluxation. Degenerative changes are noted C1-C2 articulation. Mild posterior spurring at C5-C6 and C6-C7 level. Soft tissues and spinal canal: No prevertebral soft tissue swelling. Cervical airway is patent. Minimal spinal canal stenosis due to posterior spurring at C5-C6 and C6-C7 level. Disc levels:  Mild disc space flattening at C5-C6 and C6-C7 level. Upper chest: There is no pneumothorax in visualized lung apices. Other: None IMPRESSION: 1. No acute intracranial abnormality. 2. Mild cerebral atrophy. Mild periventricular chronic white matter disease. No acute cortical infarction. 3. Extensive atherosclerotic calcifications of vertebral arteries and carotid siphon. 4. No cervical spine acute fracture or subluxation. Degenerative changes as described above. Electronically Signed   By: Lahoma Crocker M.D.   On: 08/23/2016 11:00    Procedures Procedures (including critical care time)  Medications Ordered in ED Medications  ondansetron (ZOFRAN-ODT) disintegrating tablet 8 mg (8 mg Oral Given 08/23/16 0832)  acetaminophen (TYLENOL) tablet 650 mg (650 mg Oral Given 08/23/16 0843)  promethazine (PHENERGAN) injection 12.5 mg (12.5 mg Intravenous Given 08/23/16 1007)  morphine 2 MG/ML injection 2 mg (2 mg Intravenous Given 08/23/16 1007)     Initial Impression / Assessment and Plan / ED Course  I have reviewed the triage vital signs and the nursing notes.  Pertinent labs & imaging results that were available during my care of the patient were reviewed by me and considered in my medical decision making (see chart for details).  Clinical Course      Labs reviewed and discussed with patient.  She was placed on bactrim for uti, cx ordered. Discussed low hemoglobin and occult blood in stool. Advised recheck by pcp within one week,  Returning here for any new or worsened sx. Pt understands plan.  Seen by Dr.Zackowski prior to dc home.  Final Clinical Impressions(s) / ED Diagnoses   Final diagnoses:  Acute cystitis with hematuria  Occult blood in stools  Iron deficiency anemia, unspecified iron deficiency anemia type    New Prescriptions Discharge Medication List as of 08/23/2016 12:28 PM    START taking these medications   Details  sulfamethoxazole-trimethoprim (BACTRIM DS,SEPTRA DS) 800-160 MG tablet Take 1 tablet by mouth 2 (two) times daily., Starting Wed 08/23/2016, Until Wed 08/30/2016, Print         Evalee Jefferson, PA-C 08/24/16 [id number]    Fredia Sorrow, MD 08/24/16 1452

## 2016-08-26 LAB — URINE CULTURE: Culture: >=100000 — AB

## 2016-08-27 ENCOUNTER — Telehealth: Payer: Self-pay

## 2016-08-27 NOTE — Telephone Encounter (Signed)
Post ED Visit - Positive Culture Follow-up  Culture report reviewed by antimicrobial stewardship pharmacist:  []  Elenor Quinones, Pharm.D. []  Heide Guile, Pharm.D., BCPS []  Parks Neptune, Pharm.D. []  Alycia Rossetti, Pharm.D., BCPS []  [place], Pharm.D., BCPS, AAHIVP []  Legrand Como, Pharm.D., BCPS, AAHIVP []  Milus Glazier, Pharm.D. []  Stephens November, Pharm.D.  Positive urine culture Treated with Sulfamethoxazole-Trimethoprim, organism sensitive to the same and no further patient follow-up is required at this time.  Genia Del 08/27/2016, 10:55 AM

## 2016-09-18 DIAGNOSIS — E039 Hypothyroidism, unspecified: Secondary | ICD-10-CM | POA: Diagnosis not present

## 2016-09-18 DIAGNOSIS — I10 Essential (primary) hypertension: Secondary | ICD-10-CM | POA: Diagnosis not present

## 2016-09-18 DIAGNOSIS — E782 Mixed hyperlipidemia: Secondary | ICD-10-CM | POA: Diagnosis not present

## 2016-09-18 DIAGNOSIS — Z9189 Other specified personal risk factors, not elsewhere classified: Secondary | ICD-10-CM | POA: Diagnosis not present

## 2016-09-18 DIAGNOSIS — G301 Alzheimer's disease with late onset: Secondary | ICD-10-CM | POA: Diagnosis not present

## 2016-09-18 DIAGNOSIS — M810 Age-related osteoporosis without current pathological fracture: Secondary | ICD-10-CM | POA: Diagnosis not present

## 2016-09-18 DIAGNOSIS — F331 Major depressive disorder, recurrent, moderate: Secondary | ICD-10-CM | POA: Diagnosis not present

## 2016-09-18 DIAGNOSIS — D649 Anemia, unspecified: Secondary | ICD-10-CM | POA: Diagnosis not present

## 2016-09-18 DIAGNOSIS — K210 Gastro-esophageal reflux disease with esophagitis: Secondary | ICD-10-CM | POA: Diagnosis not present

## 2016-09-26 DIAGNOSIS — D539 Nutritional anemia, unspecified: Secondary | ICD-10-CM | POA: Diagnosis not present

## 2016-09-26 DIAGNOSIS — G301 Alzheimer's disease with late onset: Secondary | ICD-10-CM | POA: Diagnosis not present

## 2016-09-26 DIAGNOSIS — M810 Age-related osteoporosis without current pathological fracture: Secondary | ICD-10-CM | POA: Diagnosis not present

## 2016-09-26 DIAGNOSIS — F331 Major depressive disorder, recurrent, moderate: Secondary | ICD-10-CM | POA: Diagnosis not present

## 2016-09-26 DIAGNOSIS — E782 Mixed hyperlipidemia: Secondary | ICD-10-CM | POA: Diagnosis not present

## 2016-09-26 DIAGNOSIS — K219 Gastro-esophageal reflux disease without esophagitis: Secondary | ICD-10-CM | POA: Diagnosis not present

## 2016-09-26 DIAGNOSIS — M170 Bilateral primary osteoarthritis of knee: Secondary | ICD-10-CM | POA: Diagnosis not present

## 2016-09-26 DIAGNOSIS — Z6822 Body mass index (BMI) 22.0-22.9, adult: Secondary | ICD-10-CM | POA: Diagnosis not present

## 2016-09-26 DIAGNOSIS — I10 Essential (primary) hypertension: Secondary | ICD-10-CM | POA: Diagnosis not present

## 2016-09-26 DIAGNOSIS — N183 Chronic kidney disease, stage 3 (moderate): Secondary | ICD-10-CM | POA: Diagnosis not present

## 2016-09-26 DIAGNOSIS — Z9189 Other specified personal risk factors, not elsewhere classified: Secondary | ICD-10-CM | POA: Diagnosis not present

## 2016-09-26 DIAGNOSIS — E039 Hypothyroidism, unspecified: Secondary | ICD-10-CM | POA: Diagnosis not present

## 2016-09-28 ENCOUNTER — Encounter: Payer: Self-pay | Admitting: Internal Medicine

## 2016-10-18 ENCOUNTER — Other Ambulatory Visit: Payer: Self-pay

## 2016-10-18 ENCOUNTER — Ambulatory Visit (INDEPENDENT_AMBULATORY_CARE_PROVIDER_SITE_OTHER): Payer: Medicare Other | Admitting: Gastroenterology

## 2016-10-18 ENCOUNTER — Encounter: Payer: Self-pay | Admitting: Gastroenterology

## 2016-10-18 VITALS — BP 142/82 | HR 76 | Temp 98.6°F | Ht 66.0 in | Wt 147.4 lb

## 2016-10-18 DIAGNOSIS — R195 Other fecal abnormalities: Secondary | ICD-10-CM | POA: Insufficient documentation

## 2016-10-18 DIAGNOSIS — D500 Iron deficiency anemia secondary to blood loss (chronic): Secondary | ICD-10-CM

## 2016-10-18 DIAGNOSIS — D509 Iron deficiency anemia, unspecified: Secondary | ICD-10-CM

## 2016-10-18 DIAGNOSIS — K921 Melena: Secondary | ICD-10-CM

## 2016-10-18 MED ORDER — PEG 3350-KCL-NA BICARB-NACL 420 G PO SOLR: 4000.0000 mL | ORAL | 0 refills | Status: DC

## 2016-10-18 NOTE — Progress Notes (Signed)
Primary Care Physician:  [name], MD  Primary Gastroenterologist:  Garfield Cornea, MD   Chief Complaint  Patient presents with  . Anemia    ?TCS/EGD, weakness  . Constipation    has seen bright red blood    HPI:  [name] is a 82 y.o. female here at the Request of Dr. Quillian Quince for further evaluation of iron deficiency anemia, heme positive stool. Patient seen in the emergency department back in December, noted to have a hemoglobin of 8.2. One year prior hemoglobin was 11.7. She was iFOBT positive in December. Labs 3 weeks ago indicated low iron, low normal ferritin, hemoglobin 8.8. Daughter reports that she has had significant hypotension and her blood pressure medication had to be discontinued. She shows me pictures of blood pressure readings from home one month ago with systolic in the [id number] range. During that time patient was also having difficulty with significant weakness, becoming clammy with ambulation. All of this is better off of her blood pressure medication. According to the daughter the patient used to be significantly overweight when she started blood pressure medication but throughout the year she has continued to gradually lose weight. She feels like this is while she no longer needs to blood pressure medication.  There is been no signs of melena. She does have intermittent red blood per rectum if she strains. For the most part her stools are soft but she sometimes has trouble getting them started and has to assist her self. This has been going on for years. Denies abdominal pain. Heartburn adequately controlled on pantoprazole. Denies dysphagia. She has chronic intermittent left lower quadrant pain, history of pancolonic diverticulosis. No fever. Pain usually persist less than a day. Notes that that side of her abdomen is always "touchy".  September 2012: 205 pounds January 2013: 177 pounds June 2013: 154 pounds August 2016: 162 pounds February 2017: 155 pounds February  2018: 147 pounds    Current Outpatient Prescriptions  Medication Sig Dispense Refill  . CALCIUM PO Take 2 tablets by mouth daily.    Marland Kitchen levothyroxine (SYNTHROID, LEVOTHROID) 88 MCG tablet Take 88 mcg by mouth daily before breakfast.    . Multiple Vitamins-Minerals (CENTRUM SILVER ADULT 50+ PO) Take 1 capsule by mouth daily.    . pantoprazole (PROTONIX) 40 MG tablet Take 40 mg by mouth daily.     . pravastatin (PRAVACHOL) 20 MG tablet Take 20 mg by mouth daily.     No current facility-administered medications for this visit.     Allergies as of 10/18/2016 - Review Complete 10/18/2016  Allergen Reaction Noted  . Metronidazole Other (See Comments) 09/12/2011  . Oxycodone Other (See Comments) 11/07/2010  . Ciprofloxacin Rash 11/07/2010  . Latex Itching and Rash 11/21/2013    Past Medical History:  Diagnosis Date  . Breast cancer ([place]) 2003  . Diverticulitis    History  . Dyslexia   . GERD (gastroesophageal reflux disease)    takes Nexium daily  . H/O hiatal hernia   . Headache(784.0)    all the time  . History of esophageal spasm   . History of IBS    no problem in past yr  . History of kidney stones   . History of shingles   . HTN (hypertension)    takes Diovan daily  . Hyperlipidemia    takes Pravastatin daily  . Hypokalemia   . Hypothyroidism    takes Synthroid daily  . Impaired hearing   . Infiltrating lobular carcinoma ([place]) 04/04/2013  .  Joint pain   . Joint swelling   . Neck pain    pinched   . Osteoarthritis   . Osteoporosis 09/08/2011  . PONV (postoperative nausea and vomiting)   . Syncope, non cardiac   . Weakness    numbness left arm    Past Surgical History:  Procedure Laterality Date  . Arm Fx     left  . BREAST LUMPECTOMY     Left  . CARDIAC CATHETERIZATION     x 3-last one in the 90's per pt  . CATARACT EXTRACTION    . CATARACT EXTRACTION W/PHACO  10/02/2011   Procedure: CATARACT EXTRACTION PHACO AND INTRAOCULAR LENS PLACEMENT (IOC);   Surgeon: [name], MD;  Location: [facility];  Service: Ophthalmology;  Laterality: Right;  CDE:17.01  . CHOLECYSTECTOMY  1997   Status post -stones  . COLONOSCOPY  2012   Dr. Gala Romney: pancolonic diverticulosis, tubular adenoma removed from cecum. next TCS 2019  . EGD with dilitation     2004/2005 schatki's ring s/p dilation, wrap no longer intact on 2005 egd.  . kidney stent  06/04/11   removed after about 3 weeks.  Marland Kitchen [place]  . KNEE SURGERY     bilateral  . LAPAROSCOPIC NISSEN FUNDOPLICATION  0000000  . LITHOTRIPSY  11/12  . MANDIBLE RECONSTRUCTION    . NECK SURGERY  2000   [facility]  . PARATHYROIDECTOMY  12/03/2013   DR TEOH  . SHOULDER SURGERY Left   . THYROIDECTOMY     Hypothyroidism status post  . THYROIDECTOMY N/A 12/03/2013   Procedure: PARATHYROIDECTOMY;  Surgeon: [name], MD;  Location: [facility];  Service: ENT;  Laterality: N/A;  . TUBAL LIGATION    . WRIST SURGERY Left 2009    Family History  Problem Relation Age of Onset  . Macular degeneration Sister   . Lung cancer Sister   . Pancreatic cancer Mother   . Heart attack Father   . Anesthesia problems Neg Hx   . Hypotension Neg Hx   . Pseudochol deficiency Neg Hx   . Malignant hyperthermia Neg Hx   . Colon cancer Neg Hx     Social History   Social History  . Marital status: Widowed    Spouse name: N/A  . Number of children: N/A  . Years of education: N/A   Occupational History  . Not on file.   Social History Main Topics  . Smoking status: Never Smoker  . Smokeless tobacco: Never Used  . Alcohol use No     Comment: occasionally  . Drug use: No  . Sexual activity: No   Other Topics Concern  . Not on file   Social History Narrative  . No narrative on file      ROS:  General: Negative for anorexia,   fever, chills, see hpi Eyes: Negative for vision changes.  ENT: Negative for hoarseness, difficulty swallowing , nasal congestion. CV: Negative for chest pain, angina, palpitations,  dyspnea on exertion, peripheral edema.  Respiratory: Negative for dyspnea at rest, dyspnea on exertion, cough, sputum, wheezing.  GI: See history of present illness. GU:  Negative for dysuria, hematuria, urinary incontinence, urinary frequency, nocturnal urination.  MS: Negative for joint pain, low back pain.  Derm: Negative for rash or itching.  Neuro: Negative for weakness, abnormal sensation, seizure, frequent headaches, memory loss, confusion.  Psych: Negative for anxiety, depression, suicidal ideation, hallucinations.  Endo: Negative for unusual weight change.  Heme: Negative for bruising or  bleeding. Allergy: Negative for rash or hives.    Physical Examination:  BP (!) 142/82 (BP Location: Right Arm, Cuff Size: Normal)   Pulse 76   Temp 98.6 F (37 C) (Oral)   Ht 5\' 6"  (1.676 m)   Wt 147 lb 6.4 oz (66.9 kg)   BMI 23.79 kg/m    General: Well-nourished, well-developed in no acute distress. Accompanied by daughter. Head: Normocephalic, atraumatic.   Eyes: Conjunctiva pink, no icterus. Mouth: Oropharyngeal mucosa moist and pink , no lesions erythema or exudate. Neck: Supple without thyromegaly, masses, or lymphadenopathy.  Lungs: Clear to auscultation bilaterally.  Heart: Regular  rhythm, no murmurs rubs or gallops. Slight tachycardia of 110 after ambulating to the exam table, recheck pulse after sitting still for a few minutes and was 76. Abdomen: Bowel sounds are normal, minimal left lower quadrant tenderness to deep palpation, nondistended, no hepatosplenomegaly or masses, no abdominal bruits or    hernia , no rebound or guarding.   Rectal: Deferred Extremities: No lower extremity edema. No clubbing or deformities.  Neuro: Alert and oriented x 4 , grossly normal neurologically.  Skin: Warm and dry, no rash or jaundice.   Psych: Alert and cooperative, normal mood and affect.  Labs: Labs dated 09/26/2016 TSH 2.830, iron 20 low, TIBC 300, ferritin 18, B12 501, folate greater  than 20, white blood cell count 11,000, hemoglobin 8.8, hematocrit 27.9, MCV 81, platelets 430,000, BUN 20, creatinine 0.89, total bilirubin less than 0.2, alkaline phosphatase 74, AST 14, ALT 6, albumin 3.3  Imaging Studies: No results found.

## 2016-10-18 NOTE — Patient Instructions (Signed)
1. Colonoscopy with possible upper endoscopy with Dr. Gala Romney. See separate instructions.  2. Please have your labs done today.

## 2016-10-19 ENCOUNTER — Other Ambulatory Visit: Payer: Self-pay

## 2016-10-19 ENCOUNTER — Other Ambulatory Visit: Payer: Self-pay | Admitting: Gastroenterology

## 2016-10-19 ENCOUNTER — Encounter: Payer: Self-pay | Admitting: Gastroenterology

## 2016-10-19 ENCOUNTER — Ambulatory Visit (HOSPITAL_COMMUNITY)
Admission: RE | Admit: 2016-10-19 | Discharge: 2016-10-19 | Disposition: A | Discharge status: Home / Self Care | Payer: Medicare Other | Source: Ambulatory Visit | Attending: Gastroenterology | Admitting: Gastroenterology

## 2016-10-19 DIAGNOSIS — R590 Localized enlarged lymph nodes: Secondary | ICD-10-CM | POA: Insufficient documentation

## 2016-10-19 DIAGNOSIS — D72829 Elevated white blood cell count, unspecified: Secondary | ICD-10-CM | POA: Insufficient documentation

## 2016-10-19 DIAGNOSIS — R1031 Right lower quadrant pain: Secondary | ICD-10-CM

## 2016-10-19 DIAGNOSIS — I700 Atherosclerosis of aorta: Secondary | ICD-10-CM | POA: Diagnosis not present

## 2016-10-19 DIAGNOSIS — R933 Abnormal findings on diagnostic imaging of other parts of digestive tract: Secondary | ICD-10-CM | POA: Insufficient documentation

## 2016-10-19 DIAGNOSIS — K449 Diaphragmatic hernia without obstruction or gangrene: Secondary | ICD-10-CM | POA: Insufficient documentation

## 2016-10-19 DIAGNOSIS — R1032 Left lower quadrant pain: Secondary | ICD-10-CM | POA: Insufficient documentation

## 2016-10-19 DIAGNOSIS — G8929 Other chronic pain: Secondary | ICD-10-CM

## 2016-10-19 LAB — CBC WITH DIFFERENTIAL/PLATELET
BASOS PCT: 0 %
Basophils Absolute: 0 cells/uL (ref 0–200)
EOS PCT: 1 %
Eosinophils Absolute: 127 cells/uL (ref 15–500)
HCT: 27.6 % — ABNORMAL LOW (ref 35.0–45.0)
HEMOGLOBIN: 8.3 g/dL — AB (ref 11.7–15.5)
LYMPHS ABS: 2413 {cells}/uL (ref 850–3900)
Lymphocytes Relative: 19 %
MCH: 23.9 pg — ABNORMAL LOW (ref 27.0–33.0)
MCHC: 30.1 g/dL — ABNORMAL LOW (ref 32.0–36.0)
MCV: 79.3 fL — ABNORMAL LOW (ref 80.0–100.0)
MPV: 7.9 fL (ref 7.5–12.5)
Monocytes Absolute: 1016 cells/uL — ABNORMAL HIGH (ref 200–950)
Monocytes Relative: 8 %
NEUTROS ABS: 9144 {cells}/uL — AB (ref 1500–7800)
Neutrophils Relative %: 72 %
Platelets: 478 10*3/uL — ABNORMAL HIGH (ref 140–400)
RBC: 3.48 MIL/uL — AB (ref 3.80–5.10)
RDW: 15.5 % — ABNORMAL HIGH (ref 11.0–15.0)
WBC: 12.7 10*3/uL — ABNORMAL HIGH (ref 3.8–10.8)

## 2016-10-19 LAB — POCT I-STAT CREATININE: CREATININE: 0.8 mg/dL (ref 0.44–1.00)

## 2016-10-19 MED ORDER — AMOXICILLIN-POT CLAVULANATE 875-125 MG PO TABS: 1.0000 | ORAL_TABLET | Freq: Two times a day (BID) | ORAL | 0 refills | Status: DC

## 2016-10-19 MED ORDER — IOPAMIDOL (ISOVUE-300) INJECTION 61%
INTRAVENOUS | Status: AC
  Administered 2016-10-19: 30 mL
  Filled 2016-10-19: qty 30

## 2016-10-19 MED ORDER — IOPAMIDOL (ISOVUE-300) INJECTION 61%
100.0000 mL | Freq: Once | INTRAVENOUS | Status: AC | PRN
  Administered 2016-10-19: 100 mL via INTRAVENOUS

## 2016-10-19 NOTE — Progress Notes (Signed)
Sorry, it was not clear but diagnosis for CT needs to be LEFT lower quadrant pain, leukocytosis.

## 2016-10-19 NOTE — Progress Notes (Signed)
Discussed findings separately with daughter and patient after discussion with Dr. Gala Romney. Abnormal left colon,? Diverticulitis related. Cannot rule out underlying malignancy. Per Dr. Gala Romney, treat for diverticulitis. Given patient's allergies, she was provided Augmentin twice a day for 10 days. Rx sent in. Patient advised to consume liquid/soft diet for the next few days. Call with any questions or concerns.  -->Per Dr. Gala Romney, reschedule her colonoscopy with possible upper endoscopy right before her 30 day cut off. Looks like sometime the week of March 12. PLEASE CONTACT DAUGHTER [name] WITH NEW INSTRUCTIONS AS PATIENT WILL NOT REMEMBER.

## 2016-10-19 NOTE — Progress Notes (Signed)
cc'ed to pcp °

## 2016-10-19 NOTE — Progress Notes (Signed)
Please let patient know that her hemoglobin is stable however her white blood cell count is up which is concerning for infection. She reported no fever yesterday during her office visit. She did have left lower quadrant pain which she told me she has off and on all the time but given leukocytosis we need to rule out diverticulitis prior to receiving with colonoscopy which is scheduled for February 22.  Please arrange for CT abdomen/pelvis with contrast, needs to be done today. Diagnosis of lower quadrant pain, leukocytosis

## 2016-10-19 NOTE — Assessment & Plan Note (Signed)
82 year old female presenting for further evaluation of iron deficiency anemia, Hemoccult-positive stool, intermittent bright red blood per rectum. Last colonoscopy 2012, tubular adenoma removed from her cecum at that time. Last EGD in 2005 for dysphagia, Schatzki ring disrupted. One year ago patient's hemoglobin was near normal. Recently patient has had significant weakness, clammy and diaphoretic with ambulation, hypotension. Most of these symptoms have subsided after stopping her blood pressure medication for notable hypotension. Tachycardia noted with ambulation in our office today.  Recommend colonoscopy plus or minus upper endoscopy for further evaluation of IDA/heme positive stool/rectal bleeding.  I have discussed the risks, alternatives, benefits with regards to but not limited to the risk of reaction to medication, bleeding, infection, perforation and the patient is agreeable to proceed. Written consent to be obtained.  Recheck CBC today.

## 2016-10-19 NOTE — Progress Notes (Signed)
Noted and she is going to the hospital now for CT scan. She is aware

## 2016-10-19 NOTE — Progress Notes (Signed)
I have deleted old order and put in new one. I needed to change diagnosis to LLQ pain instead of RLQ pain. Thanks.

## 2016-10-20 ENCOUNTER — Telehealth: Payer: Self-pay

## 2016-10-20 DIAGNOSIS — D500 Iron deficiency anemia secondary to blood loss (chronic): Secondary | ICD-10-CM

## 2016-10-20 NOTE — Telephone Encounter (Signed)
She could have chronic occult bleeding from chronic inflammation. Once she has been treated for diverticulitis, we will due the colonoscopy +/- egd to check things out.   Let's plan to recheck her CBC in 2 weeks. Thanks!

## 2016-10-20 NOTE — Telephone Encounter (Signed)
Pt's daughter [name]) called and had a question. She was wondering if her mom's bleeding could be coming from diverticulitis? Informed her I would send message to LSL. Call back number is [phone].

## 2016-10-23 ENCOUNTER — Other Ambulatory Visit: Payer: Self-pay

## 2016-10-23 DIAGNOSIS — R195 Other fecal abnormalities: Secondary | ICD-10-CM

## 2016-10-23 DIAGNOSIS — D509 Iron deficiency anemia, unspecified: Secondary | ICD-10-CM

## 2016-10-23 NOTE — Telephone Encounter (Signed)
Called and informed pt's daughter [name]). Procedure is scheduled for 11/17/16 per chart. Lab order for CBC mailed to pt. Pt lives with her daughter. Advised daughter that Magda Paganini wants lab work done in 2 weeks.

## 2016-10-30 ENCOUNTER — Encounter (HOSPITAL_COMMUNITY): Payer: Medicare Other

## 2016-10-30 ENCOUNTER — Encounter (HOSPITAL_COMMUNITY): Payer: Self-pay

## 2016-10-30 ENCOUNTER — Encounter (HOSPITAL_COMMUNITY): Payer: Medicare Other | Attending: Oncology | Admitting: Oncology

## 2016-10-30 VITALS — BP 124/59 | HR 127 | Temp 98.1°F | Resp 16 | Wt 142.7 lb

## 2016-10-30 DIAGNOSIS — D500 Iron deficiency anemia secondary to blood loss (chronic): Secondary | ICD-10-CM | POA: Insufficient documentation

## 2016-10-30 DIAGNOSIS — Z853 Personal history of malignant neoplasm of breast: Secondary | ICD-10-CM

## 2016-10-30 DIAGNOSIS — C50919 Malignant neoplasm of unspecified site of unspecified female breast: Secondary | ICD-10-CM | POA: Insufficient documentation

## 2016-10-30 DIAGNOSIS — E039 Hypothyroidism, unspecified: Secondary | ICD-10-CM | POA: Diagnosis not present

## 2016-10-30 LAB — COMPREHENSIVE METABOLIC PANEL
ALBUMIN: 3.3 g/dL — AB (ref 3.5–5.0)
ALK PHOS: 63 U/L (ref 38–126)
ALT: 8 U/L — AB (ref 14–54)
AST: 18 U/L (ref 15–41)
Anion gap: 8 (ref 5–15)
BUN: 19 mg/dL (ref 6–20)
CALCIUM: 9.1 mg/dL (ref 8.9–10.3)
CO2: 27 mmol/L (ref 22–32)
CREATININE: 1.01 mg/dL — AB (ref 0.44–1.00)
Chloride: 102 mmol/L (ref 101–111)
GFR calc Af Amer: 60 mL/min — ABNORMAL LOW (ref >60)
GFR calc non Af Amer: 52 mL/min — ABNORMAL LOW (ref >60)
GLUCOSE: 107 mg/dL — AB (ref 65–99)
Potassium: 3.6 mmol/L (ref 3.5–5.1)
SODIUM: 137 mmol/L (ref 135–145)
Total Bilirubin: 0.5 mg/dL (ref 0.3–1.2)
Total Protein: 7.7 g/dL (ref 6.5–8.1)

## 2016-10-30 LAB — CBC WITH DIFFERENTIAL/PLATELET
BASOS PCT: 0 %
Basophils Absolute: 0 10*3/uL (ref 0.0–0.1)
Eosinophils Absolute: 0.1 10*3/uL (ref 0.0–0.7)
Eosinophils Relative: 1 %
HEMATOCRIT: 30.3 % — AB (ref 36.0–46.0)
HEMOGLOBIN: 9.3 g/dL — AB (ref 12.0–15.0)
LYMPHS ABS: 2.3 10*3/uL (ref 0.7–4.0)
Lymphocytes Relative: 19 %
MCH: 23.9 pg — AB (ref 26.0–34.0)
MCHC: 30.7 g/dL (ref 30.0–36.0)
MCV: 77.9 fL — ABNORMAL LOW (ref 78.0–100.0)
MONOS PCT: 8 %
Monocytes Absolute: 1 10*3/uL (ref 0.1–1.0)
NEUTROS ABS: 8.5 10*3/uL — AB (ref 1.7–7.7)
NEUTROS PCT: 72 %
Platelets: 422 10*3/uL — ABNORMAL HIGH (ref 150–400)
RBC: 3.89 MIL/uL (ref 3.87–5.11)
RDW: 15.6 % — ABNORMAL HIGH (ref 11.5–15.5)
WBC: 11.9 10*3/uL — AB (ref 4.0–10.5)

## 2016-10-30 LAB — IRON AND TIBC
Iron: 41 ug/dL (ref 28–170)
Saturation Ratios: 10 % — ABNORMAL LOW (ref 10.4–31.8)
TIBC: 410 ug/dL (ref 250–450)
UIBC: 369 ug/dL

## 2016-10-30 LAB — FERRITIN: Ferritin: 9 ng/mL — ABNORMAL LOW (ref 11–307)

## 2016-10-30 NOTE — Progress Notes (Addendum)
Belinda Ponto, MD [name] [id number]  Stage IIA, infiltrating lobular carcinoma diagnosed in 2003 Partial mastectomy 02/25/2002 ER+ PR+ Her-2 neu negative Adjuvant XRT Completed 5 years of AI therapy History of abnormal CT of the chest, last performed in 09/26/2013  CURRENT THERAPY: NCCN guidelines for surveillance.  Reclast annually for osteoporosis (last given on 09/27/2012).  INTERVAL HISTORY: [name] 82 y.o. female returns for  regular  visit for followup of stage II A. (T2, N0, M0) infiltrating lobular carcinoma left breast diagnosed in 2003 status post partial mastectomy on 02/25/2002 with a 3.0 x 3.5 cm primary ER +99% PR +80% HER-2/neu nonamplified and Ki-67 marker was low at 10% surgery with negative sentinel nodes felt to be well-differentiated status post radiation therapy followed by Arimidex initially, which she could not tolerate, followed by letrozole and she completed 5 full years of adjuvant hormonal therapy.  The patient reports she had recent abnormal labs promptiong a CT scan on 10/19/16. She denies dark stool or hematuria. The patient is scheduled for a colonoscopy on 11/17/16. The patient has not had a mammogram since 2016.   She reports pains throughout her body related to arthritis. She denies abdominal pain other than that related to diverticulitis. She denies diarrhea. She denies chest pain or shortness of breath. She reports hearing loss.  Past Medical History:  Diagnosis Date  . Breast cancer ([place]) 2003  . Diverticulitis    History  . Dyslexia   . GERD (gastroesophageal reflux disease)    takes Nexium daily  . H/O hiatal hernia   . Headache(784.0)    all the time  . History of esophageal spasm   . History of IBS    no problem in past yr  . History of kidney stones   . History of shingles   . HTN (hypertension)    takes Diovan daily  . Hyperlipidemia    takes Pravastatin daily  . Hypokalemia   . Hypothyroidism    takes Synthroid daily  .  Impaired hearing   . Infiltrating lobular carcinoma ([place]) 04/04/2013  . Joint pain   . Joint swelling   . Neck pain    pinched   . Osteoarthritis   . Osteoporosis 09/08/2011  . PONV (postoperative nausea and vomiting)   . Syncope, non cardiac   . Weakness    numbness left arm    has FULL INCONTINENCE OF FECES; DIARRHEA; Osteoporosis; Syncope and collapse; Chest pain; Dehydration; Acute renal failure ([place]); HTN (hypertension); Hypothyroid; GERD (gastroesophageal reflux disease); Infiltrating lobular carcinoma, left breast; Diverticulitis; Generalized weakness; Acute diverticulitis; Neck mass; S/P parathyroidectomy ([facility]); IDA (iron deficiency anemia); and Heme positive stool on her problem list.     is allergic to metronidazole; oxycodone; ciprofloxacin; and latex.  [name] does not currently have medications on file.  Past Surgical History:  Procedure Laterality Date  . Arm Fx     left  . BREAST LUMPECTOMY     Left  . CARDIAC CATHETERIZATION     x 3-last one in the 90's per pt  . CATARACT EXTRACTION    . CATARACT EXTRACTION W/PHACO  10/02/2011   Procedure: CATARACT EXTRACTION PHACO AND INTRAOCULAR LENS PLACEMENT (IOC);  Surgeon: [name], MD;  Location: [facility];  Service: Ophthalmology;  Laterality: Right;  CDE:17.01  . CHOLECYSTECTOMY  1997   Status post -stones  . COLONOSCOPY  2012   Dr. Gala Romney: pancolonic diverticulosis, tubular adenoma removed from cecum. next TCS 2019  . EGD with  dilitation     2004/2005 schatki's ring s/p dilation, wrap no longer intact on 2005 egd.  . kidney stent  06/04/11   removed after about 3 weeks.  [facility] [place]  . KNEE SURGERY     bilateral  . LAPAROSCOPIC NISSEN FUNDOPLICATION  3016  . LITHOTRIPSY  11/12  . MANDIBLE RECONSTRUCTION    . NECK SURGERY  2000   [facility]  . PARATHYROIDECTOMY  12/03/2013   DR TEOH  . SHOULDER SURGERY Left   . THYROIDECTOMY     Hypothyroidism status post  . THYROIDECTOMY N/A 12/03/2013   Procedure:  PARATHYROIDECTOMY;  Surgeon: [name], MD;  Location: [place] [facility];  Service: ENT;  Laterality: N/A;  . TUBAL LIGATION    . WRIST SURGERY Left 2009    Review of Systems  Constitutional: Negative.   HENT: Positive for hearing loss.   Eyes: Negative.   Respiratory: Negative.  Negative for shortness of breath.   Cardiovascular: Negative.  Negative for chest pain.  Gastrointestinal: Positive for abdominal pain (Related to diverticulitis). Negative for blood in stool, diarrhea and melena.  Genitourinary: Negative.   Musculoskeletal: Negative.   Skin: Negative.   Neurological: Negative.   Endo/Heme/Allergies: Negative.   Psychiatric/Behavioral: Negative.   All other systems reviewed and are negative.    PHYSICAL EXAMINATION  ECOG PERFORMANCE STATUS: 1 - Symptomatic but completely ambulatory  There were no vitals filed for this visit.  Physical Exam  Constitutional: She is oriented to person, place, and time and well-developed, well-nourished, and in no distress.  HENT:  Head: Normocephalic and atraumatic.  Eyes: Conjunctivae and EOM are normal. Pupils are equal, round, and reactive to light.  Neck: Normal range of motion. Neck supple.  Cardiovascular: Normal rate, regular rhythm and normal heart sounds.   Pulmonary/Chest: Effort normal and breath sounds normal.    Abdominal: Soft. Bowel sounds are normal. There is tenderness (LLQ tenderness to palpation).  Musculoskeletal: Normal range of motion.  Neurological: She is alert and oriented to person, place, and time. Gait normal.  Skin: Skin is warm and dry.  Nursing note and vitals reviewed.   LABORATORY DATA: I have reviewed the data as listed. CBC    Component Value Date/Time   WBC 12.7 (H) 10/18/2016 1517   RBC 3.48 (L) 10/18/2016 1517   HGB 8.3 (L) 10/18/2016 1517   HCT 27.6 (L) 10/18/2016 1517   PLT 478 (H) 10/18/2016 1517   MCV 79.3 (L) 10/18/2016 1517   MCH 23.9 (L) 10/18/2016 1517   MCHC 30.1 (L) 10/18/2016 1517    RDW 15.5 (H) 10/18/2016 1517   LYMPHSABS 2,413 10/18/2016 1517   MONOABS 1,016 (H) 10/18/2016 1517   EOSABS 127 10/18/2016 1517   BASOSABS 0 10/18/2016 1517      Chemistry      Component Value Date/Time   NA 138 08/23/2016 0840   K 3.5 08/23/2016 0840   CL 102 08/23/2016 0840   CO2 28 08/23/2016 0840   BUN 22 (H) 08/23/2016 0840   CREATININE 0.80 10/19/2016 1144      Component Value Date/Time   CALCIUM 8.9 08/23/2016 0840   CALCIUM 8.7 10/22/2014 1003   ALKPHOS 60 04/17/2016 1230   AST 22 04/17/2016 1230   ALT 15 04/17/2016 1230   BILITOT 0.5 04/17/2016 1230       Patient Active Problem List   Diagnosis Date Noted  . IDA (iron deficiency anemia) 10/18/2016  . Heme positive stool 10/18/2016  . S/P parathyroidectomy (  [place]) 12/03/2013  . Neck mass 10/30/2013  . Diverticulitis 10/26/2013  . Generalized weakness 10/26/2013  . Acute diverticulitis 10/26/2013  . Infiltrating lobular carcinoma, left breast 04/04/2013  . Syncope and collapse 02/21/2012  . Chest pain 02/21/2012  . Dehydration 02/21/2012  . Acute renal failure ([place]) 02/21/2012  . HTN (hypertension) 02/21/2012  . Hypothyroid 02/21/2012  . GERD (gastroesophageal reflux disease) 02/21/2012  . Osteoporosis 09/08/2011  . FULL INCONTINENCE OF FECES 11/07/2010  . DIARRHEA 11/07/2010   RADIOLOGY: I have reviewed the images below and agree with the reported results. CTAP w contrast 10/19/16 IMPRESSION: 1. Abnormal left colonic segment measuring approximately 12 cm in length spanning the junction of the descending and sigmoid colon with shelf-like margins and irregular circumferential wall thickening and wall hyperenhancement, suspicious for colonic malignancy. Superimposed acute diverticulitis cannot be excluded given underlying colonic diverticulosis and pericolonic fat stranding. Colonoscopy correlation advised. 2. No free air.  No abscess. 3. Nonspecific mild left mesenteric lymphadenopathy, cannot  exclude nodal metastases. 4. Aortic atherosclerosis. 5. Small to moderate hiatal hernia.  ASSESSMENT/PLAN: Stage IIA, infiltrating lobular carcinoma diagnosed in 2003 Partial mastectomy 02/25/2002 ER+ PR+ Her-2 neu negative Adjuvant XRT Completed 5 years of AI therapy History of abnormal CT of the chest, last performed in 09/26/2013 Kidney stones Anemia  CTAP reviewed with patient today. I'm concered that she may have an underlying colon malignancy. Encouraged patient to follow up with GI about upcoming colonoscopy. Colonoscopy is scheduled with Dr. Gala Romney on 11/17/16. Check CEA today.  May have anemia due to GI bleed, check CBC, iron studies. Will give her IV iron replacement as needed based on labs.   Clinically NED on breast exam today. Encouraged the patient to follow up with routine yearly mammogram, which she has not had since 2016. I have ordered a bilateral diagnostic mammogram today.   The patient will have labs today including CEA CMP CBC and iron studies.  F/U in 12 months for annual breast exam. I have advised patient that should she be found to have a new colon CA after her colonoscopy, she is to come see me right away. She verbalized understanding.   THERAPY PLAN:  NCCN guidelines recommends the following surveillance for invasive breast cancer:  A. History and Physical exam every 4-6 months for 5 years and then every 12 months.  B. Mammography every 12 months  C. Women on Tamoxifen: annual gynecologic assessment every 12 months if uterus is present.  D. Women on aromatase inhibitor or who experience ovarian failure secondary to treatment should have monitoring of bone health with a bone mineral density determination at baseline and periodically thereafter.  E. Assess and encourage adherence to adjuvant endocrine therapy.  F. Evidence suggests that active lifestyle and achieving and maintaining an ideal body weight (20-25 BMI) may lead to optimal breast cancer  outcomes.   All questions were answered. The patient knows to call the clinic with any problems, questions or concerns. We can certainly see the patient much sooner if necessary. This note was signed electronically.  This document serves as a record of services personally performed by Twana First, MD. It was created on her behalf by Maryla Morrow, a trained medical scribe. The creation of this record is based on the scribe's personal observations and the provider's statements to them. This document has been checked and approved by the attending provider.  I have reviewed the above documentation for accuracy and completeness, and I agree with the above. Maryla Morrow  MD  Addendum: Patient's CEA is 7. Proceed with colonoscopy. Suspicion for underlying GI malignancy as the cause of her chronic bleed. Iron deficiency anemia from chronic GI bleed, I will get her set up for injectafer x 2 doses.  See her back in follow up in the last week of March, once her colonoscopy is complete.

## 2016-10-30 NOTE — Patient Instructions (Signed)
[place] at [facility] Discharge Instructions  RECOMMENDATIONS MADE BY THE CONSULTANT AND ANY TEST RESULTS WILL BE SENT TO YOUR REFERRING PHYSICIAN.  You were seen today by Dr. Twana First We will do lab work today and will call you with results You will have a mammogram scheduled Follow up in clinic in 1 year See Amy up front for appointments   Thank you for choosing [place] at [facility] to provide your oncology and hematology care.  To afford each patient quality time with our provider, please arrive at least 15 minutes before your scheduled appointment time.    If you have a lab appointment with the [place] please come in thru the  Main Entrance and check in at the main information desk  You need to re-schedule your appointment should you arrive 10 or more minutes late.  We strive to give you quality time with our providers, and arriving late affects you and other patients whose appointments are after yours.  Also, if you no show three or more times for appointments you may be dismissed from the clinic at the providers discretion.     Again, thank you for choosing [facility].  Our hope is that these requests will decrease the amount of time that you wait before being seen by our physicians.       _____________________________________________________________  Should you have questions after your visit to [facility], please contact our office at [phone] between the hours of 8:30 a.m. and 4:30 p.m.  Voicemails left after 4:30 p.m. will not be returned until the following business day.  For prescription refill requests, have your pharmacy contact our office.       Resources For Cancer Patients and their Caregivers ? American Cancer Society: Can assist with transportation, wigs, general needs, runs Look Good Feel Better.        209-087-2052 ? Cancer Care: Provides financial assistance,  online support groups, medication/co-pay assistance.  1-800-813-HOPE (684)531-1802) ? [facility] Assists [place] cancer patients and their families through emotional , educational and financial support.  (614)672-9007 ? [place] DSS Where to apply for food stamps, Medicaid and utility assistance. 414-432-4955 ? RCATS: Transportation to medical appointments. (872)595-4485 ? Social Security Administration: May apply for disability if have a Stage IV cancer. (706)698-3931 (315)275-1514 ? [employer], Disability and Transit Services: Assists with nutrition, care and transit needs. [place] Support Programs: @10RELATIVEDAYS @ > Cancer Support Group  2nd Tuesday of the month 1pm-2pm, Journey Room  > Creative Journey  3rd Tuesday of the month 1130am-1pm, Journey Room  > Look Good Feel Better  1st Wednesday of the month 10am-12 noon, Journey Room (Call [place] to register [phone])

## 2016-10-31 ENCOUNTER — Encounter (HOSPITAL_COMMUNITY): Payer: Medicare Other

## 2016-10-31 ENCOUNTER — Telehealth (HOSPITAL_COMMUNITY): Payer: Self-pay

## 2016-10-31 LAB — CEA: CEA: 7.7 ng/mL — ABNORMAL HIGH (ref 0.0–4.7)

## 2016-10-31 NOTE — Telephone Encounter (Signed)
Patients daughter called wanting clarification of what Dr. Talbert Cage had told the patient yesterday. Read her Dr. Laverle Patter note and encouraged them to keep follow up with GI.

## 2016-11-01 ENCOUNTER — Other Ambulatory Visit (HOSPITAL_COMMUNITY): Payer: Self-pay | Admitting: Oncology

## 2016-11-01 ENCOUNTER — Telehealth: Payer: Self-pay

## 2016-11-01 DIAGNOSIS — D500 Iron deficiency anemia secondary to blood loss (chronic): Secondary | ICD-10-CM

## 2016-11-01 NOTE — Telephone Encounter (Signed)
pts daughter- [name] called with multiple questions about her CT results and about the procedures she is scheduled for. I answered all her questions. They have not gotten her prep yet but if they have any problems getting it, they will call us.

## 2016-11-01 NOTE — Addendum Note (Signed)
Addended by: Twana First on: 11/01/2016 03:27 PM   Modules accepted: Orders

## 2016-11-03 ENCOUNTER — Telehealth: Payer: Self-pay

## 2016-11-03 NOTE — Telephone Encounter (Signed)
Hold iron for 7 days before the procedures.

## 2016-11-03 NOTE — Telephone Encounter (Signed)
Called and informed daughter [name]).

## 2016-11-03 NOTE — Telephone Encounter (Signed)
Pt's daughter called office and said that pt started on Iron (daughter unsure of dose) about a month ago. She is scheduled for TCS/+/-EGD with RMR 11/17/16. Routing to LSL to advise about Iron in regards to upcoming procedure.

## 2016-11-08 ENCOUNTER — Encounter (HOSPITAL_COMMUNITY): Payer: Self-pay

## 2016-11-08 ENCOUNTER — Encounter (HOSPITAL_COMMUNITY): Payer: Medicare Other | Attending: Oncology

## 2016-11-08 VITALS — BP 147/76 | HR 81 | Temp 98.6°F | Resp 20

## 2016-11-08 DIAGNOSIS — C50919 Malignant neoplasm of unspecified site of unspecified female breast: Secondary | ICD-10-CM | POA: Insufficient documentation

## 2016-11-08 DIAGNOSIS — D509 Iron deficiency anemia, unspecified: Secondary | ICD-10-CM

## 2016-11-08 DIAGNOSIS — D500 Iron deficiency anemia secondary to blood loss (chronic): Secondary | ICD-10-CM | POA: Insufficient documentation

## 2016-11-08 DIAGNOSIS — M8000XD Age-related osteoporosis with current pathological fracture, unspecified site, subsequent encounter for fracture with routine healing: Secondary | ICD-10-CM

## 2016-11-08 MED ORDER — SODIUM CHLORIDE 0.9 % IV SOLN
750.0000 mg | Freq: Once | INTRAVENOUS | Status: AC
  Administered 2016-11-08: 750 mg via INTRAVENOUS
  Filled 2016-11-08: qty 15

## 2016-11-08 MED ORDER — SODIUM CHLORIDE 0.9 % IV SOLN
INTRAVENOUS | Status: DC
  Administered 2016-11-08: 14:00:00 via INTRAVENOUS

## 2016-11-08 NOTE — Progress Notes (Signed)
[name] tolerated Injectafer well without complaints or incident. VSS upon discharge. Pt discharged self ambulatory in satisfactory condition

## 2016-11-08 NOTE — Patient Instructions (Signed)
[facility] at [facility] Discharge Instructions  RECOMMENDATIONS MADE BY THE CONSULTANT AND ANY TEST RESULTS WILL BE SENT TO YOUR REFERRING PHYSICIAN.  *Received Injectafer today. Follow-up as scheduled. Call clinic for any questions or concerns Thank you for choosing [facility] at [facility] to provide your oncology and hematology care.  To afford each patient quality time with our provider, please arrive at least 15 minutes before your scheduled appointment time.    If you have a lab appointment with the Cancer Center please come in thru the  Main Entrance and check in at the main information desk  You need to re-schedule your appointment should you arrive 10 or more minutes late.  We strive to give you quality time with our providers, and arriving late affects you and other patients whose appointments are after yours.  Also, if you no show three or more times for appointments you may be dismissed from the clinic at the providers discretion.     Again, thank you for choosing [facility].  Our hope is that these requests will decrease the amount of time that you wait before being seen by our physicians.       _____________________________________________________________  Should you have questions after your visit to [facility], please contact our office at [phone] between the hours of 8:30 a.m. and 4:30 p.m.  Voicemails left after 4:30 p.m. will not be returned until the following business day.  For prescription refill requests, have your pharmacy contact our office.       Resources For Cancer Patients and their Caregivers ? American Cancer Society: Can assist with transportation, wigs, general needs, runs Look Good Feel Better.        1-888-227-6333 ? Cancer Care: Provides financial assistance, online support groups, medication/co-pay assistance.  1-800-813-HOPE (4673) ? [facility] Assists [place] cancer patients and their families through emotional , educational and financial support.  336-427-4357 ? [place] DSS Where to apply for food stamps, Medicaid and utility assistance. 336-342-1394 ? RCATS: Transportation to medical appointments. 336-347-2287 ? Social Security Administration: May apply for disability if have a Stage IV cancer. 336-342-7796 1-800-772-1213 ? [place] [employer], Disability and Transit Services: Assists with nutrition, care and transit needs. 336-349-2343  Cancer Center Support Programs: @10RELATIVEDAYS@ > Cancer Support Group  2nd Tuesday of the month 1pm-2pm, Journey Room  > Creative Journey  3rd Tuesday of the month 1130am-1pm, Journey Room  > Look Good Feel Better  1st Wednesday of the month 10am-12 noon, Journey Room (Call [employer] to register 1-800-395-5775)   

## 2016-11-10 DIAGNOSIS — R195 Other fecal abnormalities: Secondary | ICD-10-CM | POA: Diagnosis not present

## 2016-11-10 DIAGNOSIS — D509 Iron deficiency anemia, unspecified: Secondary | ICD-10-CM | POA: Diagnosis not present

## 2016-11-10 LAB — CBC WITH DIFFERENTIAL/PLATELET
BASOS ABS: 0 {cells}/uL (ref 0–200)
Basophils Relative: 0 %
EOS PCT: 1 %
Eosinophils Absolute: 125 cells/uL (ref 15–500)
HCT: 29.6 % — ABNORMAL LOW (ref 35.0–45.0)
Hemoglobin: 8.8 g/dL — ABNORMAL LOW (ref 11.7–15.5)
LYMPHS ABS: 1750 {cells}/uL (ref 850–3900)
LYMPHS PCT: 14 %
MCH: 22.9 pg — AB (ref 27.0–33.0)
MCHC: 29.7 g/dL — AB (ref 32.0–36.0)
MCV: 77.1 fL — ABNORMAL LOW (ref 80.0–100.0)
MPV: 7.7 fL (ref 7.5–12.5)
Monocytes Absolute: 1000 cells/uL — ABNORMAL HIGH (ref 200–950)
Monocytes Relative: 8 %
NEUTROS PCT: 77 %
Neutro Abs: 9625 cells/uL — ABNORMAL HIGH (ref 1500–7800)
PLATELETS: 406 10*3/uL — AB (ref 140–400)
RBC: 3.84 MIL/uL (ref 3.80–5.10)
RDW: 16.1 % — AB (ref 11.0–15.0)
WBC: 12.5 10*3/uL — AB (ref 3.8–10.8)

## 2016-11-14 ENCOUNTER — Telehealth: Payer: Self-pay

## 2016-11-14 NOTE — Telephone Encounter (Signed)
Pt is aware and her daughter is aware

## 2016-11-14 NOTE — Telephone Encounter (Signed)
Pt's daughter is wanting to make sure that her mother blood work is ok for her to still have her test Friday. Please advise

## 2016-11-14 NOTE — Telephone Encounter (Signed)
See result note. Her CBC is stable and she is good for colonoscopy/EGD on Friday.

## 2016-11-14 NOTE — Progress Notes (Signed)
CEA elevated per hem/onc's work up. WBC, H/H stable. Plans for IV iron per hem/onc.  [place] for TCS for Friday.

## 2016-11-15 ENCOUNTER — Encounter (HOSPITAL_COMMUNITY): Payer: Self-pay

## 2016-11-15 ENCOUNTER — Encounter (HOSPITAL_BASED_OUTPATIENT_CLINIC_OR_DEPARTMENT_OTHER): Payer: Medicare Other

## 2016-11-15 ENCOUNTER — Other Ambulatory Visit (HOSPITAL_COMMUNITY): Payer: Self-pay | Admitting: Family Medicine

## 2016-11-15 VITALS — BP 137/54 | HR 80 | Temp 98.1°F | Resp 18

## 2016-11-15 DIAGNOSIS — D500 Iron deficiency anemia secondary to blood loss (chronic): Secondary | ICD-10-CM

## 2016-11-15 DIAGNOSIS — Z1231 Encounter for screening mammogram for malignant neoplasm of breast: Secondary | ICD-10-CM

## 2016-11-15 DIAGNOSIS — D509 Iron deficiency anemia, unspecified: Secondary | ICD-10-CM

## 2016-11-15 DIAGNOSIS — M8000XD Age-related osteoporosis with current pathological fracture, unspecified site, subsequent encounter for fracture with routine healing: Secondary | ICD-10-CM

## 2016-11-15 MED ORDER — SODIUM CHLORIDE 0.9 % IV SOLN
INTRAVENOUS | Status: AC
  Administered 2016-11-15: 14:00:00 via INTRAVENOUS

## 2016-11-15 MED ORDER — SODIUM CHLORIDE 0.9 % IV SOLN
750.0000 mg | Freq: Once | INTRAVENOUS | Status: AC
  Administered 2016-11-15: 750 mg via INTRAVENOUS
  Filled 2016-11-15: qty 15

## 2016-11-15 NOTE — Patient Instructions (Signed)
[place] at [facility] Discharge Instructions  RECOMMENDATIONS MADE BY THE CONSULTANT AND ANY TEST RESULTS WILL BE SENT TO YOUR REFERRING PHYSICIAN.  IV infusion.    Thank you for choosing [place] at [facility] to provide your oncology and hematology care.  To afford each patient quality time with our provider, please arrive at least 15 minutes before your scheduled appointment time.    If you have a lab appointment with the [place] please come in thru the  Main Entrance and check in at the main information desk  You need to re-schedule your appointment should you arrive 10 or more minutes late.  We strive to give you quality time with our providers, and arriving late affects you and other patients whose appointments are after yours.  Also, if you no show three or more times for appointments you may be dismissed from the clinic at the providers discretion.     Again, thank you for choosing [facility].  Our hope is that these requests will decrease the amount of time that you wait before being seen by our physicians.       _____________________________________________________________  Should you have questions after your visit to [facility], please contact our office at [phone] between the hours of 8:30 a.m. and 4:30 p.m.  Voicemails left after 4:30 p.m. will not be returned until the following business day.  For prescription refill requests, have your pharmacy contact our office.       Resources For Cancer Patients and their Caregivers ? American Cancer Society: Can assist with transportation, wigs, general needs, runs Look Good Feel Better.        204-736-1193 ? Cancer Care: Provides financial assistance, online support groups, medication/co-pay assistance.  1-800-813-HOPE (234)544-7510) ? [facility] Assists [place] cancer patients and their families through emotional ,  educational and financial support.  661-859-7277 ? [place] DSS Where to apply for food stamps, Medicaid and utility assistance. 820-309-4650 ? RCATS: Transportation to medical appointments. 4696677845 ? Social Security Administration: May apply for disability if have a Stage IV cancer. 416-202-3572 (702)425-4154 ? [employer], Disability and Transit Services: Assists with nutrition, care and transit needs. [place] Support Programs: @10RELATIVEDAYS @ > Cancer Support Group  2nd Tuesday of the month 1pm-2pm, Journey Room  > Creative Journey  3rd Tuesday of the month 1130am-1pm, Journey Room  > Look Good Feel Better  1st Wednesday of the month 10am-12 noon, Journey Room (Call [place] to register [phone])

## 2016-11-17 ENCOUNTER — Encounter (HOSPITAL_COMMUNITY)
Admission: RE | Disposition: A | Discharge status: Home / Self Care | Payer: Self-pay | Source: Ambulatory Visit | Attending: Internal Medicine

## 2016-11-17 ENCOUNTER — Encounter (HOSPITAL_COMMUNITY): Payer: Self-pay | Admitting: *Deleted

## 2016-11-17 ENCOUNTER — Ambulatory Visit (HOSPITAL_COMMUNITY)
Admission: RE | Admit: 2016-11-17 | Discharge: 2016-11-17 | Disposition: A | Discharge status: Home / Self Care | Payer: Medicare Other | Source: Ambulatory Visit | Attending: Internal Medicine | Admitting: Internal Medicine

## 2016-11-17 DIAGNOSIS — D509 Iron deficiency anemia, unspecified: Secondary | ICD-10-CM | POA: Diagnosis not present

## 2016-11-17 DIAGNOSIS — M810 Age-related osteoporosis without current pathological fracture: Secondary | ICD-10-CM | POA: Diagnosis not present

## 2016-11-17 DIAGNOSIS — M1990 Unspecified osteoarthritis, unspecified site: Secondary | ICD-10-CM | POA: Diagnosis not present

## 2016-11-17 DIAGNOSIS — R200 Anesthesia of skin: Secondary | ICD-10-CM | POA: Insufficient documentation

## 2016-11-17 DIAGNOSIS — Z87442 Personal history of urinary calculi: Secondary | ICD-10-CM | POA: Insufficient documentation

## 2016-11-17 DIAGNOSIS — K449 Diaphragmatic hernia without obstruction or gangrene: Secondary | ICD-10-CM | POA: Insufficient documentation

## 2016-11-17 DIAGNOSIS — E876 Hypokalemia: Secondary | ICD-10-CM | POA: Diagnosis not present

## 2016-11-17 DIAGNOSIS — E890 Postprocedural hypothyroidism: Secondary | ICD-10-CM | POA: Insufficient documentation

## 2016-11-17 DIAGNOSIS — Z853 Personal history of malignant neoplasm of breast: Secondary | ICD-10-CM | POA: Insufficient documentation

## 2016-11-17 DIAGNOSIS — K5730 Diverticulosis of large intestine without perforation or abscess without bleeding: Secondary | ICD-10-CM | POA: Insufficient documentation

## 2016-11-17 DIAGNOSIS — Z923 Personal history of irradiation: Secondary | ICD-10-CM | POA: Diagnosis not present

## 2016-11-17 DIAGNOSIS — H9190 Unspecified hearing loss, unspecified ear: Secondary | ICD-10-CM | POA: Insufficient documentation

## 2016-11-17 DIAGNOSIS — I10 Essential (primary) hypertension: Secondary | ICD-10-CM | POA: Diagnosis not present

## 2016-11-17 DIAGNOSIS — D122 Benign neoplasm of ascending colon: Secondary | ICD-10-CM | POA: Insufficient documentation

## 2016-11-17 DIAGNOSIS — K921 Melena: Secondary | ICD-10-CM | POA: Insufficient documentation

## 2016-11-17 DIAGNOSIS — R480 Dyslexia and alexia: Secondary | ICD-10-CM | POA: Insufficient documentation

## 2016-11-17 DIAGNOSIS — K219 Gastro-esophageal reflux disease without esophagitis: Secondary | ICD-10-CM | POA: Insufficient documentation

## 2016-11-17 DIAGNOSIS — E785 Hyperlipidemia, unspecified: Secondary | ICD-10-CM | POA: Diagnosis not present

## 2016-11-17 DIAGNOSIS — K529 Noninfective gastroenteritis and colitis, unspecified: Secondary | ICD-10-CM | POA: Diagnosis not present

## 2016-11-17 DIAGNOSIS — Z9841 Cataract extraction status, right eye: Secondary | ICD-10-CM | POA: Insufficient documentation

## 2016-11-17 HISTORY — PX: ESOPHAGOGASTRODUODENOSCOPY: SHX5428

## 2016-11-17 HISTORY — PX: COLONOSCOPY: SHX5424

## 2016-11-17 LAB — CBC
HEMATOCRIT: 30.4 % — AB (ref 36.0–46.0)
Hemoglobin: 9.2 g/dL — ABNORMAL LOW (ref 12.0–15.0)
MCH: 24.2 pg — ABNORMAL LOW (ref 26.0–34.0)
MCHC: 30.3 g/dL (ref 30.0–36.0)
MCV: 80 fL (ref 78.0–100.0)
Platelets: 310 10*3/uL (ref 150–400)
RBC: 3.8 MIL/uL — ABNORMAL LOW (ref 3.87–5.11)
RDW: 19.2 % — AB (ref 11.5–15.5)
WBC: 13.2 10*3/uL — ABNORMAL HIGH (ref 4.0–10.5)

## 2016-11-17 SURGERY — COLONOSCOPY
Anesthesia: Moderate Sedation

## 2016-11-17 MED ORDER — MEPERIDINE HCL 100 MG/ML IJ SOLN
INTRAMUSCULAR | Status: AC
  Filled 2016-11-17: qty 2

## 2016-11-17 MED ORDER — LIDOCAINE VISCOUS 2 % MT SOLN
OROMUCOSAL | Status: DC | PRN
  Administered 2016-11-17: 1 via OROMUCOSAL

## 2016-11-17 MED ORDER — MIDAZOLAM HCL 5 MG/5ML IJ SOLN
INTRAMUSCULAR | Status: AC
  Filled 2016-11-17: qty 10

## 2016-11-17 MED ORDER — ONDANSETRON HCL 4 MG/2ML IJ SOLN
INTRAMUSCULAR | Status: AC
  Filled 2016-11-17: qty 2

## 2016-11-17 MED ORDER — SODIUM CHLORIDE 0.9 % IV SOLN
INTRAVENOUS | Status: DC
  Administered 2016-11-17: 1000 mL via INTRAVENOUS

## 2016-11-17 MED ORDER — LIDOCAINE VISCOUS 2 % MT SOLN
OROMUCOSAL | Status: AC
  Filled 2016-11-17: qty 15

## 2016-11-17 MED ORDER — MIDAZOLAM HCL 5 MG/5ML IJ SOLN
INTRAMUSCULAR | Status: DC | PRN
  Administered 2016-11-17 (×3): 1 mg via INTRAVENOUS
  Administered 2016-11-17: 2 mg via INTRAVENOUS
  Administered 2016-11-17: 1 mg via INTRAVENOUS

## 2016-11-17 MED ORDER — MEPERIDINE HCL 100 MG/ML IJ SOLN
INTRAMUSCULAR | Status: DC | PRN
  Administered 2016-11-17 (×3): 25 mg via INTRAVENOUS

## 2016-11-17 NOTE — Discharge Instructions (Addendum)
Colonoscopy Discharge Instructions  Read the instructions outlined below and refer to this sheet in the next few weeks. These discharge instructions provide you with general information on caring for yourself after you leave the hospital. Your doctor may also give you specific instructions. While your treatment has been planned according to the most current medical practices available, unavoidable complications occasionally occur. If you have any problems or questions after discharge, call Dr. Gala Romney at [phone]. ACTIVITY  You may resume your regular activity, but move at a slower pace for the next 24 hours.   Take frequent rest periods for the next 24 hours.   Walking will help get rid of the air and reduce the bloated feeling in your belly (abdomen).   No driving for 24 hours (because of the medicine (anesthesia) used during the test).    Do not sign any important legal documents or operate any machinery for 24 hours (because of the anesthesia used during the test).  NUTRITION  Drink plenty of fluids.   You may resume your normal diet as instructed by your doctor.   Begin with a light meal and progress to your normal diet. Heavy or fried foods are harder to digest and may make you feel sick to your stomach (nauseated).   Avoid alcoholic beverages for 24 hours or as instructed.  MEDICATIONS  You may resume your normal medications unless your doctor tells you otherwise.  WHAT YOU CAN EXPECT TODAY  Some feelings of bloating in the abdomen.   Passage of more gas than usual.   Spotting of blood in your stool or on the toilet paper.  IF YOU HAD POLYPS REMOVED DURING THE COLONOSCOPY:  No aspirin products for 7 days or as instructed.   No alcohol for 7 days or as instructed.   Eat a soft diet for the next 24 hours.  FINDING OUT THE RESULTS OF YOUR TEST Not all test results are available during your visit. If your test results are not back during the visit, make an appointment  with your caregiver to find out the results. Do not assume everything is normal if you have not heard from your caregiver or the medical facility. It is important for you to follow up on all of your test results.  SEEK IMMEDIATE MEDICAL ATTENTION IF:  You have more than a spotting of blood in your stool.   Your belly is swollen (abdominal distention).   You are nauseated or vomiting.   You have a temperature over 101.   You have abdominal pain or discomfort that is severe or gets worse throughout the day.    Colon polyp and diverticulosis information provided  Low residue diet  CBC today  Further recommendations to follow pending review of pathology report   Diverticulosis Diverticulosis is a condition that develops when small pouches (diverticula) form in the wall of the large intestine (colon). The colon is where water is absorbed and stool is formed. The pouches form when the inside layer of the colon pushes through weak spots in the outer layers of the colon. You may have a few pouches or many of them. What are the causes? The cause of this condition is not known. What increases the risk? The following factors may make you more likely to develop this condition:  Being older than age 62. Your risk for this condition increases with age. Diverticulosis is rare among people younger than age 33. By age 73, many people have it.  Eating a low-fiber diet.  Having frequent constipation.  Being overweight.  Not getting enough exercise.  Smoking.  Taking over-the-counter pain medicines, like aspirin and ibuprofen.  Having a family history of diverticulosis. What are the signs or symptoms? In most people, there are no symptoms of this condition. If you do have symptoms, they may include:  Bloating.  Cramps in the abdomen.  Constipation or diarrhea.  Pain in the lower left side of the abdomen. How is this diagnosed? This condition is most often diagnosed during an exam  for other colon problems. Because diverticulosis usually has no symptoms, it often cannot be diagnosed independently. This condition may be diagnosed by:  Using a flexible scope to examine the colon (colonoscopy).  Taking an X-ray of the colon after dye has been put into the colon (barium enema).  Doing a CT scan. How is this treated? You may not need treatment for this condition if you have never developed an infection related to diverticulosis. If you have had an infection before, treatment may include:  Eating a high-fiber diet. This may include eating more fruits, vegetables, and grains.  Taking a fiber supplement.  Taking a live bacteria supplement (probiotic).  Taking medicine to relax your colon.  Taking antibiotic medicines. Follow these instructions at home:  Drink 6-8 glasses of water or more each day to prevent constipation.  Try not to strain when you have a bowel movement.  If you have had an infection before:  Eat more fiber as directed by your health care provider or your diet and nutrition specialist (dietitian).  Take a fiber supplement or probiotic, if your health care provider approves.  Take over-the-counter and prescription medicines only as told by your health care provider.  If you were prescribed an antibiotic, take it as told by your health care provider. Do not stop taking the antibiotic even if you start to feel better.  Keep all follow-up visits as told by your health care provider. This is important. Contact a health care provider if:  You have pain in your abdomen.  You have bloating.  You have cramps.  You have not had a bowel movement in 3 days. Get help right away if:  Your pain gets worse.  Your bloating becomes very bad.  You have a fever or chills, and your symptoms suddenly get worse.  You vomit.  You have bowel movements that are bloody or black.  You have bleeding from your rectum. Summary  Diverticulosis is a  condition that develops when small pouches (diverticula) form in the wall of the large intestine (colon).  You may have a few pouches or many of them.  This condition is most often diagnosed during an exam for other colon problems.  If you have had an infection related to diverticulosis, treatment may include increasing the fiber in your diet, taking supplements, or taking medicines. This information is not intended to replace advice given to you by your health care provider. Make sure you discuss any questions you have with your health care provider. Document Released: 05/18/2004 Document Revised: 07/10/2016 Document Reviewed: 07/10/2016 Elsevier Interactive Patient Education  2017 [place].  Colon Polyps Polyps are tissue growths inside the body. Polyps can grow in many places, including the large intestine (colon). A polyp may be a round bump or a mushroom-shaped growth. You could have one polyp or several. Most colon polyps are noncancerous (benign). However, some colon polyps can become cancerous over time. What are the causes? The exact cause of colon polyps is  not known. What increases the risk? This condition is more likely to develop in people who:  Have a family history of colon cancer or colon polyps.  Are older than 34 or older than 45 if they are African American.  Have inflammatory bowel disease, such as ulcerative colitis or Crohn disease.  Are overweight.  Smoke cigarettes.  Do not get enough exercise.  Drink too much alcohol.  Eat a diet that is:  High in fat and red meat.  Low in fiber.  Had childhood cancer that was treated with abdominal radiation. What are the signs or symptoms? Most polyps do not cause symptoms. If you have symptoms, they may include:  Blood coming from your rectum when having a bowel movement.  Blood in your stool.The stool may look dark red or black.  A change in bowel habits, such as constipation or diarrhea. How is this  diagnosed? This condition is diagnosed with a colonoscopy. This is a procedure that uses a lighted, flexible scope to look at the inside of your colon. How is this treated? Treatment for this condition involves removing any polyps that are found. Those polyps will then be tested for cancer. If cancer is found, your health care provider will talk to you about options for colon cancer treatment. Follow these instructions at home: Diet   Eat plenty of fiber, such as fruits, vegetables, and whole grains.  Eat foods that are high in calcium and vitamin D, such as milk, cheese, yogurt, eggs, liver, fish, and broccoli.  Limit foods high in fat, red meats, and processed meats, such as hot dogs, sausage, bacon, and lunch meats.  Maintain a healthy weight, or lose weight if recommended by your health care provider. General instructions   Do not smoke cigarettes.  Do not drink alcohol excessively.  Keep all follow-up visits as told by your health care provider. This is important. This includes keeping regularly scheduled colonoscopies. Talk to your health care provider about when you need a colonoscopy.  Exercise every day or as told by your health care provider. Contact a health care provider if:  You have new or worsening bleeding during a bowel movement.  You have new or increased blood in your stool.  You have a change in bowel habits.  You unexpectedly lose weight. This information is not intended to replace advice given to you by your health care provider. Make sure you discuss any questions you have with your health care provider. Document Released: 05/17/2004 Document Revised: 01/27/2016 Document Reviewed: 07/12/2015 Elsevier Interactive Patient Education  2017 [employer].

## 2016-11-17 NOTE — Interval H&P Note (Signed)
History and Physical Interval Note:  11/17/2016 11:41 AM  [name]  has presented today for surgery, with the diagnosis of IDA/BLOOD IN STOOL  The various methods of treatment have been discussed with the patient and family. After consideration of risks, benefits and other options for treatment, the patient has consented to  Procedure(s) with comments: COLONOSCOPY (N/A) - 1200 - moved to 3/16 @ 12:00 ESOPHAGOGASTRODUODENOSCOPY (EGD) (N/A) as a surgical intervention .  The patient's history has been reviewed, patient examined, no change in status, stable for surgery.  I have reviewed the patient's chart and labs.  Questions were answered to the patient's satisfaction.     [name]  No change. Colonoscopy possible EGD to follow.  The risks, benefits, limitations, imponderables and alternatives regarding both EGD and colonoscopy have been reviewed with the patient. Questions have been answered. All parties agreeable.

## 2016-11-17 NOTE — Op Note (Addendum)
[facility] Patient Name: [name] Procedure Date: 11/17/2016 11:50 AM MRN: [id number] Date of Birth: 01/17/1935 Attending MD: Norvel Richards , MD CSN: [id number] Age: 82 Admit Type: Outpatient Procedure:                Colonoscopy with snare polypectomy and biopsy Indications:              Hematochezia, Unexplained iron deficiency anemia Providers:                Norvel Richards, MD, Lurline Del, RN, Bonnetta Barry, Technician Referring MD:             Mitzie Na. Daniel Medicines:                Midazolam 6 mg IV, Meperidine 75 mg IV, Ondansetron                            4 mg IV Complications:            No immediate complications. Estimated Blood Loss:     Estimated blood loss was minimal. Procedure:                Pre-Anesthesia Assessment:                           - Prior to the procedure, a History and Physical                            was performed, and patient medications and                            allergies were reviewed. The patient's tolerance of                            previous anesthesia was also reviewed. The risks                            and benefits of the procedure and the sedation                            options and risks were discussed with the patient.                            All questions were answered, and informed consent                            was obtained. Prior Anticoagulants: The patient has                            taken no previous anticoagulant or antiplatelet                            agents. ASA Grade Assessment: III - A patient with  severe systemic disease. After reviewing the risks                            and benefits, the patient was deemed in                            satisfactory condition to undergo the procedure.                           After obtaining informed consent, the colonoscope                            was passed under direct vision.  Throughout the                            procedure, the patient's blood pressure, pulse, and                            oxygen saturations were monitored continuously. The                            EC-[id number] ([id number]) scope was introduced through                            the anus and advanced to the the cecum, identified                            by appendiceal orifice and ileocecal valve. The                            ileocecal valve, appendiceal orifice, and rectum                            were photographed. The colonoscopy was performed                            without difficulty. The entire colon was well                            visualized. The quality of the bowel preparation                            was adequate. Scope In: 12:00:25 PM Scope Out: 12:28:55 PM Scope Withdrawal Time: 0 hours 16 minutes 4 seconds  Total Procedure Duration: 0 hours 28 minutes 30 seconds  Findings:      The perianal and digital rectal examinations were normal.      Four semi-pedunculated polyps were found in the ascending colon. The       polyps were 4 to 6 mm in size. These polyps were removed with a cold       snare. Resection and retrieval were complete. Estimated blood loss was       minimal.      Multiple small and large-mouthed diverticula were found in the entire       colon. Abnormal 15-20 cm  segment of left colon straddling the proximal       sigmoid and distal descending segment. Lumen narrowed through segment.       Mucosa edematous/swollen last hyperemic. No ulcers or erosions seen.       This did not appear to be neoplastic. Proximal and distal margins fairly       well demarcated.      The exam was otherwise without abnormality on direct and retroflexion       views. Impression:               Markedly abnormal segment of left colon as                            described; query segmental or ischemic colitis                            versus smoldering diverticulitis. Patient  does not                            appear at all acutely ill or toxic at this time.                           - Four 4 to 6 mm polyps in the ascending colon,                            removed with a cold snare. Resected and retrieved.                           - Diverticulosis in the entire examined colon.                           - The examination was otherwise normal on direct                            and retroflexion views. Moderate Sedation:      Moderate (conscious) sedation was administered by the endoscopy nurse       and supervised by the endoscopist. The following parameters were       monitored: oxygen saturation, heart rate, blood pressure, respiratory       rate, EKG, adequacy of pulmonary ventilation, and response to care.       Total physician intraservice time was 32 minutes.      Moderate (conscious) sedation was administered by the endoscopy nurse       and supervised by the endoscopist. The following parameters were       monitored: oxygen saturation, heart rate, blood pressure, respiratory       rate, EKG, adequacy of pulmonary ventilation, and response to care.       Total physician intraservice time was 32 minutes. Recommendation:           - Patient has a contact number available for                            emergencies. The signs and symptoms of potential  delayed complications were discussed with the                            patient. Return to normal activities tomorrow.                            Written discharge instructions were provided to the                            patient.                           - Resume previous diet.                           - Continue present medications.                           - Await pathology results. CBC today.Further                            recommendations to follow.                           - No repeat colonoscopy due to age.                           - Return to GI office (date not  yet determined). Procedure Code(s):        --- Professional ---                           [phone], Moderate sedation services provided by the                            same physician or other qualified health care                            professional performing the diagnostic or                            therapeutic service that the sedation supports,                            requiring the presence of an independent trained                            observer to assist in the monitoring of the                            patient's level of consciousness and physiological                            status; initial 15 minutes of intraservice time,                            patient  age 64 years or older                           [phone], Moderate sedation services provided by the                            same physician or other qualified health care                            professional performing the diagnostic or                            therapeutic service that the sedation supports,                            requiring the presence of an independent trained                            observer to assist in the monitoring of the                            patient's level of consciousness and physiological                            status; initial 15 minutes of intraservice time,                            patient age 56 years or older                           [phone], Moderate sedation services; each additional                            15 minutes intraservice time                           [id number], Moderate sedation services; each additional                            15 minutes intraservice time Diagnosis Code(s):        --- Professional ---                           D12.2, Benign neoplasm of ascending colon                           K92.1, Melena (includes Hematochezia)                           D50.9, Iron deficiency anemia, unspecified                           K57.30,  Diverticulosis of large intestine without                            perforation or abscess  without bleeding CPT copyright 2016 American Medical Association. All rights reserved. The codes documented in this report are preliminary and upon coder review may  be revised to meet current compliance requirements. Cristopher Estimable. Rourk, MD Norvel Richards, MD 11/17/2016 12:51:32 PM This report has been signed electronically. Number of Addenda: 0

## 2016-11-17 NOTE — H&P (View-Only) (Signed)
Please let patient know that her hemoglobin is stable however her white blood cell count is up which is concerning for infection. She reported no fever yesterday during her office visit. She did have left lower quadrant pain which she told me she has off and on all the time but given leukocytosis we need to rule out diverticulitis prior to receiving with colonoscopy which is scheduled for February 22.  Please arrange for CT abdomen/pelvis with contrast, needs to be done today. Diagnosis of lower quadrant pain, leukocytosis

## 2016-11-17 NOTE — H&P (View-Only) (Signed)
CEA elevated per hem/onc's work up. WBC, H/H stable. Plans for IV iron per hem/onc.  [facility] for TCS for Friday.

## 2016-11-17 NOTE — Interval H&P Note (Signed)
History and Physical Interval Note:  11/17/2016 11:40 AM  [name]  has presented today for surgery, with the diagnosis of IDA/BLOOD IN STOOL  The various methods of treatment have been discussed with the patient and family. After consideration of risks, benefits and other options for treatment, the patient has consented to  Procedure(s) with comments: COLONOSCOPY (N/A) - 1200 - moved to 3/16 @ 12:00 ESOPHAGOGASTRODUODENOSCOPY (EGD) (N/A) as a surgical intervention .  The patient's history has been reviewed, patient examined, no change in status, stable for surgery.  I have reviewed the patient's chart and labs.  Questions were answered to the patient's satisfaction.     [name]

## 2016-11-21 ENCOUNTER — Encounter (HOSPITAL_COMMUNITY): Payer: Medicare Other

## 2016-11-21 ENCOUNTER — Encounter: Payer: Self-pay | Admitting: Internal Medicine

## 2016-11-22 ENCOUNTER — Encounter: Payer: Self-pay | Admitting: Internal Medicine

## 2016-11-22 ENCOUNTER — Telehealth: Payer: Self-pay

## 2016-11-22 NOTE — Telephone Encounter (Signed)
Per RMR- Send letter to patient.  Send copy of letter with path to referring provider and PCP.   Patient needs prescription for Uceris - 9 mg tablet. Take 19 mg tablet daily. Dispense 40 with 1 refill.   Need office visit with L SL and 6 weeks

## 2016-11-22 NOTE — Telephone Encounter (Signed)
OV made and letter mailed °

## 2016-11-23 MED ORDER — BUDESONIDE 9 MG PO TB24: 9.0000 mg | ORAL_TABLET | Freq: Every day | ORAL | 1 refills | Status: DC

## 2016-11-23 NOTE — Telephone Encounter (Signed)
pts daughter- [name] is aware. rx has been sent to the pharmacy.  Letter has been mailed to the pt.

## 2016-11-27 ENCOUNTER — Ambulatory Visit (HOSPITAL_COMMUNITY): Payer: Medicare Other

## 2016-11-27 ENCOUNTER — Encounter (HOSPITAL_COMMUNITY): Payer: Self-pay | Admitting: Internal Medicine

## 2016-11-27 ENCOUNTER — Other Ambulatory Visit: Payer: Self-pay | Admitting: Oncology

## 2016-11-27 ENCOUNTER — Telehealth: Payer: Self-pay | Admitting: Internal Medicine

## 2016-11-27 DIAGNOSIS — Z1231 Encounter for screening mammogram for malignant neoplasm of breast: Secondary | ICD-10-CM

## 2016-11-27 NOTE — Telephone Encounter (Signed)
Working on PA

## 2016-11-27 NOTE — Telephone Encounter (Signed)
rx faxed to the pharmacy

## 2016-11-27 NOTE — Telephone Encounter (Signed)
Pt's daughter, [name], called to follow up on a PA on a medication for the patient. She said the pharmacy told her to call us.   She doesn't know the name of medicine. [place]

## 2016-11-27 NOTE — Telephone Encounter (Signed)
Both Uceris and Entocort are off label for microscopic colitis (Entocort more so than Uceris).  However, both will work. Therefore,  can try Entocort (3)-3 mg tablets or 9 mg daily. Dispense 120 with 1 refill.

## 2016-11-27 NOTE — Telephone Encounter (Signed)
Dr.Rourk, pts insurance wants to know why she cannot take entocort. Please advise.

## 2016-11-28 DIAGNOSIS — J111 Influenza due to unidentified influenza virus with other respiratory manifestations: Secondary | ICD-10-CM | POA: Diagnosis not present

## 2016-11-28 DIAGNOSIS — Z6821 Body mass index (BMI) 21.0-21.9, adult: Secondary | ICD-10-CM | POA: Diagnosis not present

## 2016-11-28 NOTE — Telephone Encounter (Signed)
pts daughter- [name] is aware.

## 2016-11-30 ENCOUNTER — Ambulatory Visit (HOSPITAL_COMMUNITY): Payer: Medicare Other

## 2016-12-04 ENCOUNTER — Ambulatory Visit (HOSPITAL_COMMUNITY): Payer: Medicare Other

## 2016-12-07 ENCOUNTER — Telehealth (HOSPITAL_COMMUNITY): Payer: Self-pay

## 2016-12-07 NOTE — Telephone Encounter (Signed)
Patients daughter called wanting to know why patient needed to follow up after mammogram. Patient usually comes once a year for follow up. Reviewed chart, patient is supposed to come yearly. Explained to daughter to be sure patient kept her mammogram appointment. We would call her with results and cancel appt

## 2016-12-11 ENCOUNTER — Ambulatory Visit (HOSPITAL_COMMUNITY): Payer: Medicare Other

## 2016-12-18 ENCOUNTER — Telehealth: Payer: Self-pay

## 2016-12-18 ENCOUNTER — Telehealth: Payer: Self-pay | Admitting: Internal Medicine

## 2016-12-18 NOTE — Telephone Encounter (Signed)
pts daughter is aware. Encouraged fluid intake. Pt has ov on 12/27/16 with [facility].

## 2016-12-18 NOTE — Telephone Encounter (Signed)
pts daughter- [name] called- pt was diagnosed with colitis and is taking entocort,  pt is now having episodes of diarrhea, she is not sure how many times a day or consistency, pt is not eating or drinking well and has to wear a pad every time she goes somewhere. Pt had the flu 3 weeks ago and took tamiflu. When she went to the doctor for the flu the patient had already lost 10 pounds and the daughter is afraid she has lost more. She is concerned the pt will get dehydrated and was wanting to know if diarrhea was a side effect of the entocort she is taking because she did not have diarrhea prior to taking it.

## 2016-12-18 NOTE — Telephone Encounter (Signed)
Telephone report noted. Endoscopic findings appear more consistent with ischemic or segmental colitis. She feels she is having diarrhea related to the Entocort;  Left skin had recommended she stop Entocort;  maintain fluid intake to avoid dehydration.

## 2016-12-22 ENCOUNTER — Telehealth: Payer: Self-pay | Admitting: Internal Medicine

## 2016-12-22 NOTE — Telephone Encounter (Signed)
pts daughter is aware. 

## 2016-12-22 NOTE — Telephone Encounter (Signed)
May use Imodium not to exceed dosing instructions on the bottle

## 2016-12-22 NOTE — Telephone Encounter (Signed)
Spoke with the pts daughter, pt still having diarrhea 3-4 times a day. She is having burning from all the diarrhea and wiping. The pt described the stool to the daughter as "gritty". No blood in stool. No fever. Pt has ov on Wednesday. They want to know if she can take something otc to help until her ov?

## 2016-12-22 NOTE — Telephone Encounter (Signed)
PATIENT DAUGHTER CALLED BECAUSE HER MOTHER IS HAVING DIARRHEA.  SHE NEEDS SOMETHING TO TAKE, SHE IS HURTING AND HER DAUGHTER DOES NOT KNOW WHAT TO DO   [phone]

## 2016-12-27 ENCOUNTER — Ambulatory Visit: Payer: Medicare Other | Admitting: Gastroenterology

## 2016-12-27 ENCOUNTER — Ambulatory Visit (INDEPENDENT_AMBULATORY_CARE_PROVIDER_SITE_OTHER): Payer: Medicare Other | Admitting: Gastroenterology

## 2016-12-27 ENCOUNTER — Encounter: Payer: Self-pay | Admitting: Gastroenterology

## 2016-12-27 VITALS — BP 130/83 | HR 96 | Temp 98.1°F | Ht 65.5 in | Wt 131.6 lb

## 2016-12-27 DIAGNOSIS — R634 Abnormal weight loss: Secondary | ICD-10-CM | POA: Diagnosis not present

## 2016-12-27 DIAGNOSIS — D509 Iron deficiency anemia, unspecified: Secondary | ICD-10-CM

## 2016-12-27 DIAGNOSIS — K5150 Left sided colitis without complications: Secondary | ICD-10-CM | POA: Insufficient documentation

## 2016-12-27 NOTE — Patient Instructions (Signed)
Resume Entocort (budesonide) three tablets (9mg ) daily.  Call in one week with progress report and let me know how things are going. PLEASE EAT EVEN IF YOU CANNOT TASTE IT. WE CANNOT AFFORD FOR YOU TO LOOSE MORE WEIGHT. Please follow up with the cancer center regarding cancelled appointments.

## 2016-12-27 NOTE — Progress Notes (Signed)
Primary Care Physician: [name], MD  Primary Gastroenterologist:  Garfield Cornea, MD   Chief Complaint  Patient presents with  . Diarrhea  . Nausea  . Rectal Pain    hurts when has bm  . Abdominal Pain    lower mid    HPI: [name] is a 82 y.o. female here for follow-up. She was seen back in February at the request of PCP for further evaluation of iron deficiency anemia and heme-positive stool. Hemoglobin was in the 8 range. One year prior had been nearly 51. She had low iron and low normal ferritin. Reported intermittent bright red blood per rectum with straining. Reported that for the most part stools are soft but she had to assist herself with getting her stools started. This happened while on for years. When I saw her in February she had no abdominal pain but had reported chronic intermittent left lower quadrant pain in the past. Notes that the left side of her abdomen has always been "touchy". Back in February she had 8 pound weight loss in one year. She had labs done showing leukocytosis of 12,700. We decided to perform a CT abdomen and pelvis which showed long segment (approximately 12 cm in length) of abnormal colon at the junction of the descending and sigmoid colon with circumferential irregular wall thickening. There was underlying moderate diverticulosis. Mild pericolonic fat stranding. Findings suspicious for malignancy with superimposed acute diverticulitis cannot be excluded. She was placed on antibiotics.  Colonoscopy on 11/17/2016 showed multiple small and large mouth diverticula in the entire colon. Abnormal 15-20 cm segment of the left colon straddling the proximal sigmoid and distal descending segment. Lumen narrowed through this segment. Mucosa edematous/swollen but no ulcers or erosions. Do not appear neoplastic.? Segmental versus ischemic colitis versus smoldering diverticulitis. She also had four 4-6 mm polyps removed from the descending colon. Biopsies  from descending colon demonstrated chronic moderately active colitis with cryptitis and crypt abscess formation.? Active inflammatory bowel disease although segmental colitis with diverticulosis also possible. Polyps were tubular adenomas was also a fragment of colonic mucosa with background acute and chronic inflammation.  Patient initially was prescribed Uceris but insurance will not cover. She therefore was provided Entocort 9 mg daily which she started on around March 26. Daughter called in approximately 3-4 weeks later student patient now had diarrhea, not eating or drinking well and she wasn't sure whether or not it was the medication. She has lost 2 pounds. She also notably had the flu lateral March. Dr. Gala Romney advise patient to stop Entocort.  Today's history Patient reports crampy abdominal pain and she is hungry and after meals. Usually leads to a BM. Her history somewhat difficult in fact that she does have some mild dementia. Daughter present with her today and tells me that some of the history being provided is an accurate. As the patient speaks, the daughter sitting behind her shaking her head "no" to the response. Apparently the patient did take some Imodium per recommended, helped for about 24 hours before the stools returned. Stool is described as grainy. There is a lot of air and force behind him. Patient states she is scared to eat because of the bowel movements and abdominal cramping. She complains of lack of taste to food. This is been going on for a long time. Because she does not enjoy eating, she just doesn't eat as much. Daughter reports that she eats very little. There is no vomiting. No melena or  rectal bleeding. Since February she's lost an additional 16 pounds. I discussed with both the daughter and the patient today regarding concerns for ongoing weight loss and minimal oral intake. Patient reports that she does not want a feeding tube. I discussed with her the needs to push the  fluids and food even if she doesn't have a taste for it. She needs to keep her urine light in color to maintain hydration as well.  Weight 155 pounds in December 2017. In February 2018 she was 147 pounds, down to 131 pounds now.  Patient actively sees oncology for her history of breast cancer. She was last seen on 10/30/2016 prior to colonoscopy. She received iron infusions in March 2. Dr. Twana First checked a CEA level which was elevated over 7. There are plans for patient to follow-up with oncology after the colonoscopy was complete the patient canceled the appointment twice and has not rescheduled. Encouraged patient's daughter to pursue follow-up appointment for abnormal CEA level/IDA.   Current Outpatient Prescriptions  Medication Sig Dispense Refill  . acetaminophen (TYLENOL) 650 MG CR tablet Take 650-1,300 mg by mouth every 8 (eight) hours as needed for pain.    Marland Kitchen CALCIUM PO Take 1 tablet by mouth daily.     Marland Kitchen esomeprazole (NEXIUM) 40 MG capsule Take 40 mg by mouth daily.    Marland Kitchen levothyroxine (SYNTHROID, LEVOTHROID) 88 MCG tablet Take 88 mcg by mouth daily before breakfast.    . Multiple Vitamins-Minerals (CENTRUM SILVER ADULT 50+ PO) Take 1 capsule by mouth daily.    Marland Kitchen OVER THE COUNTER MEDICATION Place 1 patch onto the skin as needed (for pain). Well Patches for pain relief    . pravastatin (PRAVACHOL) 20 MG tablet Take 20 mg by mouth daily.     No current facility-administered medications for this visit.    Facility-Administered Medications Ordered in Other Visits  Medication Dose Route Frequency Provider Last Rate Last Dose  . 0.9 %  sodium chloride infusion   Intravenous Continuous [facility], PA-C 20 mL/hr at 11/15/16 1402      Allergies as of 12/27/2016 - Review Complete 12/27/2016  Allergen Reaction Noted  . Metronidazole Other (See Comments) 09/12/2011  . Oxycodone Other (See Comments) 11/07/2010  . Ciprofloxacin Rash 11/07/2010  . Latex Itching and Rash 11/21/2013     Past Medical History:  Diagnosis Date  . Breast cancer ([place]) 2003  . Diverticulitis    History  . Dyslexia   . GERD (gastroesophageal reflux disease)    takes Nexium daily  . H/O hiatal hernia   . Headache(784.0)    all the time  . History of esophageal spasm   . History of IBS    no problem in past yr  . History of kidney stones   . History of shingles   . HTN (hypertension)    takes Diovan daily  . Hyperlipidemia    takes Pravastatin daily  . Hypokalemia   . Hypothyroidism    takes Synthroid daily  . Impaired hearing   . Infiltrating lobular carcinoma ([place]) 04/04/2013  . Joint pain   . Joint swelling   . Neck pain    pinched   . Osteoarthritis   . Osteoporosis 09/08/2011  . PONV (postoperative nausea and vomiting)   . Syncope, non cardiac   . Weakness    numbness left arm   Past Surgical History:  Procedure Laterality Date  . Arm Fx     left  . BREAST LUMPECTOMY  Left  . CARDIAC CATHETERIZATION     x 3-last one in the 90's per pt  . CATARACT EXTRACTION    . CATARACT EXTRACTION W/PHACO  10/02/2011   Procedure: CATARACT EXTRACTION PHACO AND INTRAOCULAR LENS PLACEMENT (IOC);  Surgeon: [name], MD;  Location: [facility];  Service: Ophthalmology;  Laterality: Right;  CDE:17.01  . CHOLECYSTECTOMY  1997   Status post -stones  . COLONOSCOPY  2012   Dr. Gala Romney: pancolonic diverticulosis, tubular adenoma removed from cecum. next TCS 2019  . COLONOSCOPY N/A 11/17/2016   Procedure: COLONOSCOPY;  Surgeon: [name], MD;  Location: [facility];  Service: Endoscopy;  Laterality: N/A;  1200 - moved to 3/16 @ 12:00  . EGD with dilitation     2004/2005 schatki's ring s/p dilation, wrap no longer intact on 2005 egd.  . ESOPHAGOGASTRODUODENOSCOPY N/A 11/17/2016   Procedure: ESOPHAGOGASTRODUODENOSCOPY (EGD);  Surgeon: [name], MD;  Location: [facility];  Service: Endoscopy;  Laterality: N/A;  . kidney stent  06/04/11   removed after about 3 weeks.  Marland Kitchen [place]  . KNEE SURGERY     bilateral  . LAPAROSCOPIC NISSEN FUNDOPLICATION  2409  . LITHOTRIPSY  11/12  . MANDIBLE RECONSTRUCTION    . NECK SURGERY  2000   [facility]  . PARATHYROIDECTOMY  12/03/2013   DR TEOH  . SHOULDER SURGERY Left   . THYROIDECTOMY     Hypothyroidism status post  . THYROIDECTOMY N/A 12/03/2013   Procedure: PARATHYROIDECTOMY;  Surgeon: [name], MD;  Location: [facility];  Service: ENT;  Laterality: N/A;  . TUBAL LIGATION    . WRIST SURGERY Left 2009     ROS:  General: Negative for, fever, chills, fatigue, weakness.see hpi ENT: Negative for hoarseness, difficulty swallowing , nasal congestion. CV: Negative for chest pain, angina, palpitations, dyspnea on exertion, peripheral edema.  Respiratory: Negative for dyspnea at rest, dyspnea on exertion, cough, sputum, wheezing.  GI: See history of present illness. GU:  Negative for dysuria, hematuria, urinary incontinence, urinary frequency, nocturnal urination.  Endo: see hpi   Physical Examination:   BP 130/83   Pulse 96   Temp 98.1 F (36.7 C) (Oral)   Ht 5' 5.5" (1.664 m)   Wt 131 lb 9.6 oz (59.7 kg)   BMI 21.57 kg/m   General: frail appearing wf in NAD. Pale in appearance. Accompanied by daughter.   Eyes: No icterus. Mouth: Oropharyngeal mucosa moist and pink , no lesions erythema or exudate. Lungs: Clear to auscultation bilaterally.  Heart: Regular rate and rhythm, no murmurs rubs or gallops.  Abdomen: Bowel sounds are normal, low abd tenderness, mild, nondistended, no hepatosplenomegaly or masses, no abdominal bruits or hernia , no rebound or guarding.   Extremities: No lower extremity edema. No clubbing or deformities. Neuro: Alert and oriented x 4   Skin: Warm and dry, no jaundice.   Psych: Alert and cooperative, normal mood and affect.  Labs:  Lab Results  Component Value Date   WBC 13.2 (H) 11/17/2016   HGB 9.2 (L) 11/17/2016   HCT 30.4 (L) 11/17/2016   MCV 80.0 11/17/2016   PLT 310  11/17/2016   Lab Results  Component Value Date   CREATININE 1.01 (H) 10/30/2016   BUN 19 10/30/2016   NA 137 10/30/2016   K 3.6 10/30/2016   CL 102 10/30/2016   CO2 27 10/30/2016   Lab Results  Component Value Date   ALT 8 (L) 10/30/2016  AST 18 10/30/2016   ALKPHOS 63 10/30/2016   BILITOT 0.5 10/30/2016   Lab Results  Component Value Date   CEA 7.7 (H) 10/30/2016   Lab Results  Component Value Date   IRON 41 10/30/2016   TIBC 410 10/30/2016   FERRITIN 9 (L) 10/30/2016   Lab Results  Component Value Date   TSH 3.501 10/22/2014    Imaging Studies: No results found.

## 2016-12-28 NOTE — Assessment & Plan Note (Signed)
82 year old female who presented to [place] initially for IDA, heme positive stool. CT with abnormal segment of left colon concerning for malignancy. CEA level over 7. Colonoscopy performed showing markedly abnormal segment of the left colon? Segmental or ischemic colitis versus smoldering diverticulitis but did not appear to be malignant. Biopsies from the descending colon with chronic moderately active colitis? Active inflammatory bowel disease versus segmental colitis. Patient unable to get Uceris due to lack of insurance coverage for it. She was started on Entocort 9 mg daily. After that she developed some diarrhea which she felt was related to the medication. This was in the midst of having the flu as well. Now off Entocort she continues to have loose stools. Her iron deficiency anemia is being treated by oncology hematology. She received 2 iron infusions last month. She rescheduled/canceled her last 2 appointments. Currently she does not have a pending appointment and encouraged her to reschedule as there were loose ends regarding her iron deficiency anemia and abnormal CEA level.  At this point I would like for her to resume Entocort to see if she has any improvement of her left-sided discomfort and diarrhea. She has had 25 pound weight loss since December. Daughter is concerned because patient will not eat. Patient states food does not taste good. She has some early dementia which may be playing a role. Encouraged patient to eat regardless of whether or not she had a taste for it. We need to maintain her weight. Daughter will touch base with me in one week to see how she is doing on Entocort.

## 2016-12-29 NOTE — Progress Notes (Signed)
CC'D TO PCP °

## 2017-01-03 ENCOUNTER — Telehealth: Payer: Self-pay

## 2017-01-03 NOTE — Telephone Encounter (Signed)
Pt's daughter called for update from OV last week per LSL. Pt is doing the same. Pt says when she eats or doesn't eat she gets sharp pains in her stomach, seem worse when she eats. She has appt with cancer center 01/19/17. She hasn't complained about her bowels since OV. The pain is mid lower abdomen. Using heating pad. Routing to LSL.

## 2017-01-04 NOTE — Telephone Encounter (Signed)
Tried to call daughter [name]), LMOVM and informed her.

## 2017-01-04 NOTE — Telephone Encounter (Signed)
Please let daughter know that I have reached out to discuss with Dr. Gala Romney although he is out of the office this week so I may not hear back. Continue the Entocort total of 9mg  daily. If symptoms worsen go to the er.

## 2017-01-10 MED ORDER — AMOXICILLIN-POT CLAVULANATE ER 1000-62.5 MG PO TB12: 2.0000 | ORAL_TABLET | Freq: Two times a day (BID) | ORAL | 0 refills | Status: DC

## 2017-01-10 MED ORDER — AMOXICILLIN-POT CLAVULANATE 875-125 MG PO TABS: 1.0000 | ORAL_TABLET | Freq: Two times a day (BID) | ORAL | 0 refills | Status: DC

## 2017-01-10 NOTE — Telephone Encounter (Signed)
Pharmacist from [employer] called- Augmentin XR 1000mg  is not available in any the warehouses and they will not be able to fill this. They have 875mg -500mg  available.

## 2017-01-10 NOTE — Telephone Encounter (Signed)
Please let patient's daughter know that Dr. Gala Romney has any further suggestions. He is concerned that patient may have chronic inflammation in her left colon secondary to smoldering diverticulitis. He is hoping that medication will help but may ultimately require surgical opinion regarding getting this bad part of the colon removed. No cancer seen on biopsies at time of TCS but CEA slightly elevated.   1. For right now he wants her to continue Entocort.  2. Also wants to treat further for diverticulitis with Augmentin. RX sent. 3. Have her come back to see him in 3 weeks, if he does not have an appointment needs to be with me. Would consider repeat CT imaging at that time. If not significantly better she will require surgical evaluation. 4. Patient needs to follow up with oncology, she did not keep her appointment after her colonoscopy as desired by oncology given elevated CEA level.   FYI Dr. Twana First.

## 2017-01-10 NOTE — Telephone Encounter (Signed)
Thanks for giving me the update.

## 2017-01-10 NOTE — Telephone Encounter (Signed)
rx for 372 done.

## 2017-01-10 NOTE — Addendum Note (Signed)
Addended by: Mahala Menghini on: 01/10/2017 10:05 AM   Modules accepted: Orders

## 2017-01-10 NOTE — Addendum Note (Signed)
Addended by: Mahala Menghini on: 01/10/2017 09:34 AM   Modules accepted: Orders

## 2017-01-11 ENCOUNTER — Encounter: Payer: Self-pay | Admitting: Internal Medicine

## 2017-01-11 ENCOUNTER — Encounter: Payer: Self-pay | Admitting: Gastroenterology

## 2017-01-11 NOTE — Telephone Encounter (Signed)
pts daughter- [name] is aware and will pick up abx. She said the pt was doing better, they went out over the weekend and she ate well and was out shopping and picking strawberries but on Monday she started having diarrhea 4-5x a day and has been unable to make it to the bathroom several times this week. She took the last of the entocort yesterday. They understand that the medication shouldn't be causing this but her daughter still thinks it has something to do with it. She has appointments with her pcp and oncology next week.   Please schedule ov. Daughter has to come with her and she is requesting either a Friday appointment or the latest afternoon appointment she can get d/t her job.

## 2017-01-11 NOTE — Telephone Encounter (Signed)
OK to stop entocort if they feel strongly about correlation with diarrhea.  If she continues to have diarrhea, they need to let me know. Could be secondary to overflow in setting of long segment of bowel inflammation.

## 2017-01-11 NOTE — Telephone Encounter (Signed)
pts daughter- [name], is aware.

## 2017-01-15 DIAGNOSIS — E782 Mixed hyperlipidemia: Secondary | ICD-10-CM | POA: Diagnosis not present

## 2017-01-15 DIAGNOSIS — D649 Anemia, unspecified: Secondary | ICD-10-CM | POA: Diagnosis not present

## 2017-01-15 DIAGNOSIS — N183 Chronic kidney disease, stage 3 (moderate): Secondary | ICD-10-CM | POA: Diagnosis not present

## 2017-01-15 DIAGNOSIS — E039 Hypothyroidism, unspecified: Secondary | ICD-10-CM | POA: Diagnosis not present

## 2017-01-15 DIAGNOSIS — D510 Vitamin B12 deficiency anemia due to intrinsic factor deficiency: Secondary | ICD-10-CM | POA: Diagnosis not present

## 2017-01-15 DIAGNOSIS — Z0001 Encounter for general adult medical examination with abnormal findings: Secondary | ICD-10-CM | POA: Diagnosis not present

## 2017-01-15 DIAGNOSIS — Z6822 Body mass index (BMI) 22.0-22.9, adult: Secondary | ICD-10-CM | POA: Diagnosis not present

## 2017-01-15 DIAGNOSIS — I10 Essential (primary) hypertension: Secondary | ICD-10-CM | POA: Diagnosis not present

## 2017-01-15 DIAGNOSIS — K210 Gastro-esophageal reflux disease with esophagitis: Secondary | ICD-10-CM | POA: Diagnosis not present

## 2017-01-18 DIAGNOSIS — E039 Hypothyroidism, unspecified: Secondary | ICD-10-CM | POA: Diagnosis not present

## 2017-01-18 DIAGNOSIS — M810 Age-related osteoporosis without current pathological fracture: Secondary | ICD-10-CM | POA: Diagnosis not present

## 2017-01-18 DIAGNOSIS — I10 Essential (primary) hypertension: Secondary | ICD-10-CM | POA: Diagnosis not present

## 2017-01-18 DIAGNOSIS — E782 Mixed hyperlipidemia: Secondary | ICD-10-CM | POA: Diagnosis not present

## 2017-01-18 DIAGNOSIS — F331 Major depressive disorder, recurrent, moderate: Secondary | ICD-10-CM | POA: Diagnosis not present

## 2017-01-18 DIAGNOSIS — M170 Bilateral primary osteoarthritis of knee: Secondary | ICD-10-CM | POA: Diagnosis not present

## 2017-01-18 DIAGNOSIS — K219 Gastro-esophageal reflux disease without esophagitis: Secondary | ICD-10-CM | POA: Diagnosis not present

## 2017-01-18 DIAGNOSIS — G301 Alzheimer's disease with late onset: Secondary | ICD-10-CM | POA: Diagnosis not present

## 2017-01-19 ENCOUNTER — Encounter (HOSPITAL_COMMUNITY): Payer: Medicare Other | Attending: Oncology | Admitting: Oncology

## 2017-01-19 ENCOUNTER — Encounter (HOSPITAL_COMMUNITY): Payer: Self-pay

## 2017-01-19 VITALS — BP 146/71 | HR 92 | Temp 98.7°F | Resp 16 | Wt 135.1 lb

## 2017-01-19 DIAGNOSIS — K922 Gastrointestinal hemorrhage, unspecified: Secondary | ICD-10-CM | POA: Diagnosis not present

## 2017-01-19 DIAGNOSIS — D500 Iron deficiency anemia secondary to blood loss (chronic): Secondary | ICD-10-CM | POA: Diagnosis not present

## 2017-01-19 DIAGNOSIS — Z853 Personal history of malignant neoplasm of breast: Secondary | ICD-10-CM

## 2017-01-19 DIAGNOSIS — C50919 Malignant neoplasm of unspecified site of unspecified female breast: Secondary | ICD-10-CM

## 2017-01-19 NOTE — Patient Instructions (Signed)
[place] at [facility] Discharge Instructions  RECOMMENDATIONS MADE BY THE CONSULTANT AND ANY TEST RESULTS WILL BE SENT TO YOUR REFERRING PHYSICIAN.  You were seen today by Dr. Twana First We will reschedule your mammogram Follow up in 3 months with lab work We will request a copy of the lab work done by your primary care physician.   Thank you for choosing [place] at [facility] to provide your oncology and hematology care.  To afford each patient quality time with our provider, please arrive at least 15 minutes before your scheduled appointment time.    If you have a lab appointment with the [facility] please come in thru the  Main Entrance and check in at the main information desk  You need to re-schedule your appointment should you arrive 10 or more minutes late.  We strive to give you quality time with our providers, and arriving late affects you and other patients whose appointments are after yours.  Also, if you no show three or more times for appointments you may be dismissed from the clinic at the providers discretion.     Again, thank you for choosing [facility].  Our hope is that these requests will decrease the amount of time that you wait before being seen by our physicians.       _____________________________________________________________  Should you have questions after your visit to [facility], please contact our office at [phone] between the hours of 8:30 a.m. and 4:30 p.m.  Voicemails left after 4:30 p.m. will not be returned until the following business day.  For prescription refill requests, have your pharmacy contact our office.       Resources For Cancer Patients and their Caregivers ? American Cancer Society: Can assist with transportation, wigs, general needs, runs Look Good Feel Better.        606-050-7015 ? Cancer Care: Provides financial assistance, online support  groups, medication/co-pay assistance.  1-800-813-HOPE (705)828-7592) ? [place] Assists [place] cancer patients and their families through emotional , educational and financial support.  857-705-9790 ? [place] DSS Where to apply for food stamps, Medicaid and utility assistance. 620-397-7020 ? [facility]: Transportation to medical appointments. 351-111-5543 ? Social Security Administration: May apply for disability if have a Stage IV cancer. 810-004-8162 320-257-7162 ? [employer], Disability and Transit Services: Assists with nutrition, care and transit needs. [place] Support Programs: @10RELATIVEDAYS @ > Cancer Support Group  2nd Tuesday of the month 1pm-2pm, Journey Room  > Creative Journey  3rd Tuesday of the month 1130am-1pm, Journey Room  > Look Good Feel Better  1st Wednesday of the month 10am-12 noon, Journey Room (Call Daniel to register [phone])

## 2017-01-19 NOTE — Progress Notes (Signed)
Belinda Bis, MD [place] [address]  Stage IIA, infiltrating lobular carcinoma diagnosed in 2003 Partial mastectomy 02/25/2002 ER+ PR+ Her-2 neu negative Adjuvant XRT Completed 5 years of AI therapy History of abnormal CT of the chest, last performed in 09/26/2013  CURRENT THERAPY: NCCN guidelines for surveillance.  Reclast annually for osteoporosis (last given on 09/27/2012).  INTERVAL HISTORY: [name] 83 y.o. female returns for  regular  visit for followup of stage II A. (T2, N0, M0) infiltrating lobular carcinoma left breast diagnosed in 2003 status post partial mastectomy on 02/25/2002 with a 3.0 x 3.5 cm primary ER +99% PR +80% HER-2/neu nonamplified and Ki-67 marker was low at 10% surgery with negative sentinel nodes felt to be well-differentiated status post radiation therapy followed by Arimidex initially, which she could not tolerate, followed by letrozole and she completed 5 full years of adjuvant hormonal therapy. She also follows for her anemia due to chronic GI blood loss.  She has been feeling well overall. The patient saw her PCP yesterday but forgot to discuss recent feet swelling. She notes swelling of the entire foot of the past two weeks. She has been eating less salt over the last two weeks. She admits her feet are more swollen at the end of the day.   She reports loose stool and feeling weak. She denies blood in stool or melena. She had blood work taken at her PCP's office on Monday and reviewed the results yesterday. She was told her potassium was low and was encouraged to eat potassium rich foods. She was told her iron level was fine.  She was unable to attend her scheduled routine mammogram because she had the flu. She plans to reschedule this.   Past Medical History:  Diagnosis Date  . Breast cancer ([facility]) 2003  . Diverticulitis    History  . Dyslexia   . GERD (gastroesophageal reflux disease)    takes Nexium daily  . H/O hiatal hernia   .  Headache(784.0)    all the time  . History of esophageal spasm   . History of IBS    no problem in past yr  . History of kidney stones   . History of shingles   . HTN (hypertension)    takes Diovan daily  . Hyperlipidemia    takes Pravastatin daily  . Hypokalemia   . Hypothyroidism    takes Synthroid daily  . Impaired hearing   . Infiltrating lobular carcinoma ([facility]) 04/04/2013  . Joint pain   . Joint swelling   . Neck pain    pinched   . Osteoarthritis   . Osteoporosis 09/08/2011  . PONV (postoperative nausea and vomiting)   . Syncope, non cardiac   . Weakness    numbness left arm    has FULL INCONTINENCE OF FECES; DIARRHEA; Osteoporosis; Syncope and collapse; Chest pain; Dehydration; Acute renal failure ([facility]); HTN (hypertension); Hypothyroid; GERD (gastroesophageal reflux disease); Infiltrating lobular carcinoma, left breast; Diverticulitis; Generalized weakness; Acute diverticulitis; Neck mass; S/P parathyroidectomy ([facility]); IDA (iron deficiency anemia); Heme positive stool; Abnormal weight loss; and Left sided colitis ([facility]) on her problem list.     is allergic to metronidazole; oxycodone; ciprofloxacin; and latex.  Ms. Cooley does not currently have medications on file.  Past Surgical History:  Procedure Laterality Date  . Arm Fx     left  . BREAST LUMPECTOMY     Left  . CARDIAC CATHETERIZATION     x 3-last one in the  90's per pt  . CATARACT EXTRACTION    . CATARACT EXTRACTION W/PHACO  10/02/2011   Procedure: CATARACT EXTRACTION PHACO AND INTRAOCULAR LENS PLACEMENT (IOC);  Surgeon: Kerry Hunt, MD;  Location: [facility];  Service: Ophthalmology;  Laterality: Right;  CDE:17.01  . CHOLECYSTECTOMY  1997   Status post -stones  . COLONOSCOPY  2012   Dr. Rourk: pancolonic diverticulosis, tubular adenoma removed from cecum. next TCS 2019  . COLONOSCOPY N/A 11/17/2016   Procedure: COLONOSCOPY;  Surgeon: Robert M Rourk, MD;  Location: [facility];  Service: Endoscopy;  Laterality:  N/A;  1200 - moved to 3/16 @ 12:00  . EGD with dilitation     2004/2005 schatki's ring s/p dilation, wrap no longer intact on 2005 egd.  . ESOPHAGOGASTRODUODENOSCOPY N/A 11/17/2016   Procedure: ESOPHAGOGASTRODUODENOSCOPY (EGD);  Surgeon: Robert M Rourk, MD;  Location: [facility];  Service: Endoscopy;  Laterality: N/A;  . kidney stent  06/04/11   removed after about 3 weeks.  . KIDNEY STONE SURGERY  1992  . KNEE SURGERY     bilateral  . LAPAROSCOPIC NISSEN FUNDOPLICATION  1997  . LITHOTRIPSY  11/12  . MANDIBLE RECONSTRUCTION    . NECK SURGERY  2000   [facility]  . PARATHYROIDECTOMY  12/03/2013   DR TEOH  . SHOULDER SURGERY Left   . THYROIDECTOMY     Hypothyroidism status post  . THYROIDECTOMY N/A 12/03/2013   Procedure: PARATHYROIDECTOMY;  Surgeon: Sui W Teoh, MD;  Location: [facility];  Service: ENT;  Laterality: N/A;  . TUBAL LIGATION    . WRIST SURGERY Left 2009    Review of Systems  HENT: Positive for hearing loss.   Eyes: Negative.   Respiratory: Negative.  Negative for shortness of breath.   Cardiovascular: Negative.  Negative for chest pain.       Feet swelling over the past two weeks  Gastrointestinal: Positive for abdominal pain (Related to diverticulitis). Negative for blood in stool, diarrhea and melena.       Loose stool  Genitourinary: Negative.   Musculoskeletal: Negative.   Skin: Negative.   Neurological: Positive for weakness (secondary to loose stool).  Endo/Heme/Allergies: Negative.   Psychiatric/Behavioral: Negative.   All other systems reviewed and are negative.    PHYSICAL EXAMINATION  ECOG PERFORMANCE STATUS: 1 - Symptomatic but completely ambulatory  Vitals:   01/19/17 1004  BP: (!) 146/71  Pulse: 92  Resp: 16  Temp: 98.7 F (37.1 C)    Physical Exam  Constitutional: She is oriented to person, place, and time and well-developed, well-nourished, and in no distress.  HENT:  Head: Normocephalic and atraumatic.  Eyes: Conjunctivae and EOM are normal.  Pupils are equal, round, and reactive to light.  Neck: Normal range of motion. Neck supple.  Cardiovascular: Normal rate, regular rhythm and normal heart sounds.   Pulmonary/Chest: Effort normal and breath sounds normal.  Abdominal: Soft. Bowel sounds are normal.  Musculoskeletal: Normal range of motion. She exhibits no edema.  No feet swelling at this time  Neurological: She is alert and oriented to person, place, and time. Gait normal.  Skin: Skin is warm and dry.  Nursing note and vitals reviewed.   LABORATORY DATA: I have reviewed the data as listed. CBC    Component Value Date/Time   WBC 13.2 (H) 11/17/2016 1323   RBC 3.80 (L) 11/17/2016 1323   HGB 9.2 (L) 11/17/2016 1323   HCT 30.4 (L) 11/17/2016 1323   PLT 310 11/17/2016 1323   MCV 80.0   11/17/2016 1323   MCH 24.2 (L) 11/17/2016 1323   MCHC 30.3 11/17/2016 1323   RDW 19.2 (H) 11/17/2016 1323   LYMPHSABS 1,750 11/10/2016 1409   MONOABS 1,000 (H) 11/10/2016 1409   EOSABS 125 11/10/2016 1409   BASOSABS 0 11/10/2016 1409      Chemistry      Component Value Date/Time   NA 137 10/30/2016 1105   K 3.6 10/30/2016 1105   CL 102 10/30/2016 1105   CO2 27 10/30/2016 1105   BUN 19 10/30/2016 1105   CREATININE 1.01 (H) 10/30/2016 1105      Component Value Date/Time   CALCIUM 9.1 10/30/2016 1105   CALCIUM 8.7 10/22/2014 1003   ALKPHOS 63 10/30/2016 1105   AST 18 10/30/2016 1105   ALT 8 (L) 10/30/2016 1105   BILITOT 0.5 10/30/2016 1105       Patient Active Problem List   Diagnosis Date Noted  . Abnormal weight loss 12/27/2016  . Left sided colitis ([facility]) 12/27/2016  . IDA (iron deficiency anemia) 10/18/2016  . Heme positive stool 10/18/2016  . S/P parathyroidectomy ([facility]) 12/03/2013  . Neck mass 10/30/2013  . Diverticulitis 10/26/2013  . Generalized weakness 10/26/2013  . Acute diverticulitis 10/26/2013  . Infiltrating lobular carcinoma, left breast 04/04/2013  . Syncope and collapse 02/21/2012  . Chest pain  02/21/2012  . Dehydration 02/21/2012  . Acute renal failure ([facility]) 02/21/2012  . HTN (hypertension) 02/21/2012  . Hypothyroid 02/21/2012  . GERD (gastroesophageal reflux disease) 02/21/2012  . Osteoporosis 09/08/2011  . FULL INCONTINENCE OF FECES 11/07/2010  . DIARRHEA 11/07/2010   RADIOLOGY: I have reviewed the images below and agree with the reported results.  Colonoscopy 11/17/2016 Impression: Markedly abnormal segment of left colon as described; query segmental or ischemic colitis versus smoldering diverticulitis. Patient does not appear at all acutely ill or toxic at this time. - Four 4 to 6 mm polyps in the ascending colon, removed with a cold snare. Resected and retrieved. - Diverticulosis in the entire examined colon. - The examination was otherwise normal on direct and retroflexion views. Recommendation: - Patient has a contact number available for emergencies. The signs and symptoms of potential delayed complications were discussed with the patient. Return to normal activities tomorrow. Written discharge instructions were provided to the patient. - Resume previous diet. - Continue present medications. - Await pathology results. CBC today.Further recommendations to follow. - No repeat colonoscopy due to age. - Return to GI office (date not yet determined).  CTAP w contrast 10/19/16 IMPRESSION: 1. Abnormal left colonic segment measuring approximately 12 cm in length spanning the junction of the descending and sigmoid colon with shelf-like margins and irregular circumferential wall thickening and wall hyperenhancement, suspicious for colonic malignancy. Superimposed acute diverticulitis cannot be excluded given underlying colonic diverticulosis and pericolonic fat stranding. Colonoscopy correlation advised. 2. No free air.  No abscess. 3. Nonspecific mild left mesenteric lymphadenopathy, cannot exclude nodal metastases. 4. Aortic atherosclerosis. 5. Small to moderate  hiatal hernia.  ASSESSMENT/PLAN: 1. Stage IIA, infiltrating lobular carcinoma diagnosed in 2003 Partial mastectomy 02/25/2002 ER+ PR+ Her-2 neu negative Adjuvant XRT Completed 5 years of AI therapy  Clinically NED. I will reschedule the mammogram that she missed.   2. Iron deficiency anemia due to chronic blood loss - She source of chronic bleeding is likely due to the markedly abnormal segment of left colon which may be query segmental or ischemic colitis versus smoldering diverticulitis seen on last colonoscopy in 11/2016. Patient's daughter states that Dr. Rourk may   elect for patient to go for surgical resection of the left colon. She has an appointment coming up with him early next month.  - She last received 2 doses of feraheme in March 2018. - The patient had labs drawn at her PCP's office on Monday, 01/15/2017. We will request these records and determine if the patient needs another iron infusion. - Repeat labs on next visit.  3. Pedal edema - Encouraged the patient to limit her salt intake to help manage feet swelling and to see her PCP if it persists, since she may benefit from a diuretic.  She will return for follow up in 3 months with labs.  Orders Placed This Encounter  Procedures  . CBC with Differential    Standing Status:   Future    Standing Expiration Date:   01/19/2018  . Comprehensive metabolic panel    Standing Status:   Future    Standing Expiration Date:   01/19/2018  . Iron and TIBC    Standing Status:   Future    Standing Expiration Date:   01/19/2018  . Ferritin    Standing Status:   Future    Standing Expiration Date:   01/19/2018       All questions were answered. The patient knows to call the clinic with any problems, questions or concerns. We can certainly see the patient much sooner if necessary. This note was signed electronically.  This document serves as a record of services personally performed by  , MD. It was created on her behalf by  Elizabeth Ashley, a trained medical scribe. The creation of this record is based on the scribe's personal observations and the provider's statements to them. This document has been checked and approved by the attending provider.  I have reviewed the above documentation for accuracy and completeness, and I agree with the above. Elizabeth A Ashley  MD    

## 2017-01-30 ENCOUNTER — Telehealth: Payer: Self-pay | Admitting: Internal Medicine

## 2017-01-30 DIAGNOSIS — R197 Diarrhea, unspecified: Secondary | ICD-10-CM

## 2017-01-30 NOTE — Telephone Encounter (Signed)
Container is at the front desk with orders.

## 2017-01-30 NOTE — Telephone Encounter (Signed)
Pt's daughter, [name], called this morning asking to speak with LSL regarding her mother [name]). I told her that LSL wasn't here yet and I would send the nurse a message to call. [phone]

## 2017-01-30 NOTE — Telephone Encounter (Signed)
Spoke with patient's daughter. She is concerned about the amount of diarrhea but her measures having. Diarrhea has been more of an issue since the colonoscopy.   There have appointment to come see me on June 8.  Patient is having more than 5 stools daily associate with incontinence. No blood in the stool.  She has had multiple rounds of antibiotics. Discussed having C. difficile PCR done as soon as possible. Daughter will come by the office for staple container.  Continue to hydrate, may try Imodium 1-2 mg daily. Call with worsening symptoms in the meantime.   FYI, I am off Thursday and Friday so please address results with AB, EG, or RMR in my absence.

## 2017-01-30 NOTE — Addendum Note (Signed)
Addended by: Mahala Menghini on: 01/30/2017 01:59 PM   Modules accepted: Orders

## 2017-01-31 DIAGNOSIS — R197 Diarrhea, unspecified: Secondary | ICD-10-CM | POA: Diagnosis not present

## 2017-02-01 LAB — CLOSTRIDIUM DIFFICILE BY PCR: Toxigenic C. Difficile by PCR: NOT DETECTED

## 2017-02-01 NOTE — Telephone Encounter (Signed)
CDiff was negative. Result note made. Continue recommendations previously made.

## 2017-02-01 NOTE — Telephone Encounter (Signed)
pts daughter- [name] is aware. She just gave her an imodium last night and is not sure yet if it has worked for her. She will find out tonight and will let us know if they need anything.

## 2017-02-02 ENCOUNTER — Ambulatory Visit (HOSPITAL_COMMUNITY): Payer: Medicare Other

## 2017-02-02 ENCOUNTER — Ambulatory Visit (HOSPITAL_COMMUNITY)
Admission: RE | Admit: 2017-02-02 | Discharge: 2017-02-02 | Disposition: A | Discharge status: Home / Self Care | Payer: Medicare Other | Source: Ambulatory Visit | Attending: Oncology | Admitting: Oncology

## 2017-02-02 DIAGNOSIS — Z1231 Encounter for screening mammogram for malignant neoplasm of breast: Secondary | ICD-10-CM | POA: Diagnosis not present

## 2017-02-05 NOTE — Telephone Encounter (Signed)
Please contact patient's daughter for progress report. If [name] thinks mother is doing ok, we can see her Friday as planned. If she is still having problems with diarrhea, abd pain, poor appetite, then I would like to go ahead with CT A/P with contrast urgently to follow up on abnormal sigmoid colon, diarrhea, abd pain.

## 2017-02-06 ENCOUNTER — Other Ambulatory Visit: Payer: Self-pay

## 2017-02-06 DIAGNOSIS — K639 Disease of intestine, unspecified: Secondary | ICD-10-CM

## 2017-02-06 DIAGNOSIS — R109 Unspecified abdominal pain: Secondary | ICD-10-CM

## 2017-02-06 NOTE — Telephone Encounter (Signed)
[name] called back, she said the pt is still c/o bad abd pains and she still has diarrhea but it is not quite as bad with the imodium. She said it was ok to set up CT. Daughter would like to go with her, she said she can do it this evening, tomorrow or Thursday after 4 pm. Please schedule and let [name] know.

## 2017-02-06 NOTE — Telephone Encounter (Signed)
Tried to call pts daughter, she said imodium was helping some but then phone got disconnected.

## 2017-02-06 NOTE — Telephone Encounter (Signed)
Pt is set up for CT scan on 02/07/17 @ 3:30 pm. Daughter is aware.

## 2017-02-07 ENCOUNTER — Encounter (HOSPITAL_COMMUNITY): Payer: Self-pay

## 2017-02-07 ENCOUNTER — Ambulatory Visit (HOSPITAL_COMMUNITY)
Admission: RE | Admit: 2017-02-07 | Discharge: 2017-02-07 | Disposition: A | Discharge status: Home / Self Care | Payer: Medicare Other | Source: Ambulatory Visit | Attending: Gastroenterology | Admitting: Gastroenterology

## 2017-02-07 DIAGNOSIS — N281 Cyst of kidney, acquired: Secondary | ICD-10-CM | POA: Insufficient documentation

## 2017-02-07 DIAGNOSIS — R933 Abnormal findings on diagnostic imaging of other parts of digestive tract: Secondary | ICD-10-CM | POA: Diagnosis not present

## 2017-02-07 DIAGNOSIS — R109 Unspecified abdominal pain: Secondary | ICD-10-CM | POA: Diagnosis not present

## 2017-02-07 DIAGNOSIS — K639 Disease of intestine, unspecified: Secondary | ICD-10-CM | POA: Insufficient documentation

## 2017-02-07 DIAGNOSIS — R197 Diarrhea, unspecified: Secondary | ICD-10-CM | POA: Diagnosis not present

## 2017-02-07 LAB — POCT I-STAT CREATININE: CREATININE: 0.7 mg/dL (ref 0.44–1.00)

## 2017-02-07 MED ORDER — IOPAMIDOL (ISOVUE-300) INJECTION 61%
100.0000 mL | Freq: Once | INTRAVENOUS | Status: AC | PRN
  Administered 2017-02-07: 100 mL via INTRAVENOUS

## 2017-02-07 NOTE — Telephone Encounter (Signed)
noted 

## 2017-02-09 ENCOUNTER — Encounter: Payer: Self-pay | Admitting: Gastroenterology

## 2017-02-09 ENCOUNTER — Other Ambulatory Visit: Payer: Self-pay

## 2017-02-09 ENCOUNTER — Ambulatory Visit (INDEPENDENT_AMBULATORY_CARE_PROVIDER_SITE_OTHER): Payer: Medicare Other | Admitting: Gastroenterology

## 2017-02-09 VITALS — BP 129/82 | HR 81 | Temp 97.8°F | Ht 65.0 in | Wt 132.0 lb

## 2017-02-09 DIAGNOSIS — R634 Abnormal weight loss: Secondary | ICD-10-CM

## 2017-02-09 DIAGNOSIS — K639 Disease of intestine, unspecified: Secondary | ICD-10-CM

## 2017-02-09 DIAGNOSIS — R933 Abnormal findings on diagnostic imaging of other parts of digestive tract: Secondary | ICD-10-CM | POA: Insufficient documentation

## 2017-02-09 NOTE — Progress Notes (Signed)
Primary Care Physician: Caryl Bis, MD  Primary Gastroenterologist:  Garfield Cornea, MD   Chief Complaint  Patient presents with  . Diarrhea    better; soft bm, not as often; diarrhea with some foods    HPI: [name] is a 82 y.o. female here for f/u and to discuss CT results. She completed augmentin, and not longer on entocort. Repeat CT 02/07/17 showed stable subcentimeter hypodense lesion in anterior right hepatic lobe. Enlarged extraheptic common bile duct up to 84mm, stable compared to 10/2014, stable subcentimeter hypodense lesion in spleen,  Increased colon wall thickening since prior study, mild wall thickening of mid to distal transverse colon, splenic flexure, descending colon, proximal sigmoid colon. There Is a more focal masslike area of thickening involving the mid to distal descending colon. Mildly prominent mesenteric lymph nodes, measuring up to 42mm.   Overall, patient reports stable symptoms, maybe somewhat less diarrhea. Noted with certain foods. No vomiting. Mild abdominal discomfort on the left and central lower abdomen. No ugi symptoms. Appetite poor.   Pertinent GI history Initially seen in 10/2016 for IDA, heme + stool. Chronic intermittent left lower quadrant pain. CT showed long segment (approximately 12 cm in length) of abnormal colon at junction of the descending and sigmoid colon with circumferential irregular wall thickening. There was underlying moderate diverticulosis. Mild pericolonic fat stranding. Findings suspicious for malignancy with superimposed acute diverticulitis could not be excluded. She was treated with antibiotics. Colonoscopy 11/2016 showed multiple small and large mouth diverticula in the entire colon. Abnormal 15-20 cm segment of the left colon straddling the proximal sigmoid and distal descending segment. Lumen narrowed through this segment. Mucosa edematous/swollen but no ulcers or erosions. Do not appear neoplastic.? Segmental versus  ischemic colitis versus smoldering diverticulitis. She also had four 4-6 mm polyps removed from the descending colon. Biopsies from descending colon demonstrated chronic moderately active colitis with cryptitis and crypt abscess formation.? Active inflammatory bowel disease although segmental colitis with diverticulosis also possible. Polyps were tubular adenomas was also a fragment of colonic mucosa with background acute and chronic inflammation. Patient put on Entocort (insurance would not cover Uceris.   Patient noted to have ongoing weight loss since 08/2016, over 20 pounds. Continued to have crampy abdominal pain, worsened by eating. Diminished appetite.   Sees oncology for h/o breast cancer, IDA. CEA level over 7.   Since her last OV, she was treated with another round of antibiotics for possible smoldering diverticulitis.    Current Outpatient Prescriptions  Medication Sig Dispense Refill  . acetaminophen (TYLENOL) 650 MG CR tablet Take 650-1,300 mg by mouth every 8 (eight) hours as needed for pain.    Marland Kitchen CALCIUM PO Take 1 tablet by mouth daily.     Marland Kitchen esomeprazole (NEXIUM) 40 MG capsule Take 40 mg by mouth daily.    Marland Kitchen levothyroxine (SYNTHROID, LEVOTHROID) 88 MCG tablet Take 88 mcg by mouth daily before breakfast.    . Multiple Vitamins-Minerals (CENTRUM SILVER ADULT 50+ PO) Take 1 capsule by mouth daily.    Marland Kitchen OVER THE COUNTER MEDICATION Place 1 patch onto the skin as needed (for pain). Well Patches for pain relief    . pravastatin (PRAVACHOL) 20 MG tablet Take 20 mg by mouth daily.     No current facility-administered medications for this visit.    Facility-Administered Medications Ordered in Other Visits  Medication Dose Route Frequency Provider Last Rate Last Dose  . 0.9 %  sodium chloride infusion   Intravenous  Continuous [facility] Cancer, PA-C 20 mL/hr at 11/15/16 1402      Allergies as of 02/09/2017 - Review Complete 02/09/2017  Allergen Reaction Noted  . Metronidazole Other  (See Comments) 09/12/2011  . Oxycodone Other (See Comments) 11/07/2010  . Ciprofloxacin Rash 11/07/2010  . Latex Itching and Rash 11/21/2013   Past Medical History:  Diagnosis Date  . Breast cancer ([place]) 2003  . Diverticulitis    History  . Dyslexia   . GERD (gastroesophageal reflux disease)    takes Nexium daily  . H/O hiatal hernia   . Headache(784.0)    all the time  . History of esophageal spasm   . History of IBS    no problem in past yr  . History of kidney stones   . History of shingles   . HTN (hypertension)    takes Diovan daily  . Hyperlipidemia    takes Pravastatin daily  . Hypokalemia   . Hypothyroidism    takes Synthroid daily  . Impaired hearing   . Infiltrating lobular carcinoma ([place]) 04/04/2013  . Joint pain   . Joint swelling   . Neck pain    pinched   . Osteoarthritis   . Osteoporosis 09/08/2011  . PONV (postoperative nausea and vomiting)   . Syncope, non cardiac   . Weakness    numbness left arm   Past Surgical History:  Procedure Laterality Date  . Arm Fx     left  . BREAST LUMPECTOMY     Left  . CARDIAC CATHETERIZATION     x 3-last one in the 90's per pt  . CATARACT EXTRACTION    . CATARACT EXTRACTION W/PHACO  10/02/2011   Procedure: CATARACT EXTRACTION PHACO AND INTRAOCULAR LENS PLACEMENT (IOC);  Surgeon: [name], MD;  Location: [facility];  Service: Ophthalmology;  Laterality: Right;  CDE:17.01  . CHOLECYSTECTOMY  1997   Status post -stones  . COLONOSCOPY  2012   Dr. Gala Romney: pancolonic diverticulosis, tubular adenoma removed from cecum. next TCS 2019  . COLONOSCOPY N/A 11/17/2016   Procedure: COLONOSCOPY;  Surgeon: [name], MD;  Location: [facility];  Service: Endoscopy;  Laterality: N/A;  1200 - moved to 3/16 @ 12:00  . EGD with dilitation     2004/2005 schatki's ring s/p dilation, wrap no longer intact on 2005 egd.  . ESOPHAGOGASTRODUODENOSCOPY N/A 11/17/2016   Procedure: ESOPHAGOGASTRODUODENOSCOPY (EGD);  Surgeon: [name],  MD;  Location: [facility];  Service: Endoscopy;  Laterality: N/A;  . kidney stent  06/04/11   removed after about 3 weeks.  Marland Kitchen [place]  . KNEE SURGERY     bilateral  . LAPAROSCOPIC NISSEN FUNDOPLICATION  8299  . LITHOTRIPSY  11/12  . MANDIBLE RECONSTRUCTION    . NECK SURGERY  2000   [facility]  . PARATHYROIDECTOMY  12/03/2013   DR TEOH  . SHOULDER SURGERY Left   . THYROIDECTOMY     Hypothyroidism status post  . THYROIDECTOMY N/A 12/03/2013   Procedure: PARATHYROIDECTOMY;  Surgeon: [name], MD;  Location: [facility];  Service: ENT;  Laterality: N/A;  . TUBAL LIGATION    . WRIST SURGERY Left 2009    ROS:  General: Negative for fever, chills, fatigue, weakness. See hpi.  ENT: Negative for hoarseness, difficulty swallowing , nasal congestion. CV: Negative for chest pain, angina, palpitations, dyspnea on exertion, peripheral edema.  Respiratory: Negative for dyspnea at rest, dyspnea on exertion, cough, sputum, wheezing.  GI: See history  of present illness. GU:  Negative for dysuria, hematuria, urinary incontinence, urinary frequency, nocturnal urination.  Endo: see hpi   Physical Examination:   BP 129/82   Pulse 81   Temp 97.8 F (36.6 C) (Oral)   Ht 5\' 5"  (1.651 m)   Wt 132 lb (59.9 kg)   BMI 21.97 kg/m   General: Well-nourished, well-developed in no acute distress. Accompanied by daughter Eyes: No icterus. Mouth: Oropharyngeal mucosa moist and pink , no lesions erythema or exudate. Lungs: Clear to auscultation bilaterally.  Heart: Regular rate and rhythm, no murmurs rubs or gallops.  Abdomen: Bowel sounds are normal, mild left abd pain, nondistended, no hepatosplenomegaly or masses, no abdominal bruits or hernia , no rebound or guarding.   Extremities: No lower extremity edema. No clubbing or deformities. Neuro: Alert and oriented x 4   Skin: Warm and dry, no jaundice.   Psych: Alert and cooperative, normal mood and affect.  Labs:  Lab Results  Component  Value Date   CREATININE 0.70 02/07/2017   BUN 19 10/30/2016   NA 137 10/30/2016   K 3.6 10/30/2016   CL 102 10/30/2016   CO2 27 10/30/2016    01/2017 TIBC 172, iron 58, fe sat 34, ferritin 662, folate 19.6, b12 659, WBC 9700, Hgb 10.9, Hct 34.1, Platelet 277, Tbili 0.3, AP 91, AST 33, ALT 15, alb 2.9   Imaging Studies: Ct Abdomen Pelvis W Contrast  Result Date: 02/07/2017 CLINICAL DATA:  Diarrhea abdominal pain and poor appetite for 4 months EXAM: CT ABDOMEN AND PELVIS WITH CONTRAST TECHNIQUE: Multidetector CT imaging of the abdomen and pelvis was performed using the standard protocol following bolus administration of intravenous contrast. CONTRAST:  130mL ISOVUE-300 IOPAMIDOL (ISOVUE-300) INJECTION 61% COMPARISON:  10/19/2016, 10/26/2013, 10/27/2014 FINDINGS: Lower chest: Lung bases demonstrate no acute consolidation or pleural effusion. Borderline to mild cardiomegaly. Moderate hiatal hernia. Hepatobiliary: Stable subcentimeter hypodense lesion within the anterior right hepatic lobe. Surgical clips in the gallbladder fossa. Enlarged extrahepatic common bowel duct up to 14 mm, stable compared with 10/27/2014. Pancreas: Unremarkable. No pancreatic ductal dilatation or surrounding inflammatory changes. Spleen: Stable subcentimeter hypodense lesion in the spleen compared with February 2018. Adrenals/Urinary Tract: Adrenal glands are within normal limits. No hydronephrosis. Cyst within the right kidney upper pole. Additional hypodense foci within both kidneys, many too small to further characterize but overall stable and probable small cysts. The bladder is unremarkable. Stomach/Bowel: The stomach is nonenlarged. Duodenum diverticulum. No dilated small bowel to suggest an obstruction. The appendix is not well identified. Increased colon wall thickening since the prior study, there is mild wall thickening of the mid to distal transverse colon, splenic flexure, descending colon, and proximal sigmoid colon.  There is a more focal masslike area of thickening involving the mid to distal descending colon. Vascular/Lymphatic: Aortic atherosclerosis. Mildly prominent mesenteric lymph nodes, measuring up to 8 mm, series 2, image number 33. Reproductive: Uterus and bilateral adnexa are unremarkable. Other: No free air or free fluid.  Small fat in the umbilicus. Musculoskeletal: Degenerative changes without acute or suspicious bone lesion. IMPRESSION: 1. Persistent long segment irregular masslike thickening of the descending colon/proximal sigmoid colon, worrisome for colonic malignancy. New colon wall thickening involving the splenic flexure and mid to distal transverse colon, may represent superimposed colitis. Recommend correlation with colonoscopy. 2. No evidence for a bowel obstruction. 3. Stable nonspecific subcentimeter hypodense lesion in the spleen. Subcentimeter presumed benign lesion in the liver, given lack of significant interval change 4. Renal cysts Electronically Signed  By: Donavan Foil M.D.   On: 02/07/2017 16:22   Mm Screening Breast Tomo Bilateral  Result Date: 02/05/2017 CLINICAL DATA:  Screening. EXAM: 2D DIGITAL SCREENING BILATERAL MAMMOGRAM WITH CAD AND ADJUNCT TOMO COMPARISON:  Previous exam(s). ACR Breast Density Category b: There are scattered areas of fibroglandular density. FINDINGS: There are no findings suspicious for malignancy. Images were processed with CAD. IMPRESSION: No mammographic evidence of malignancy. A result letter of this screening mammogram will be mailed directly to the patient. RECOMMENDATION: Screening mammogram in one year. (Code:SM-B-01Y) BI-RADS CATEGORY  1: Negative. Electronically Signed   By: Ammie Ferrier M.D.   On: 02/05/2017 08:19

## 2017-02-09 NOTE — Patient Instructions (Signed)
1. Referral to [facility] for abnormal colon on CT.

## 2017-02-11 NOTE — Assessment & Plan Note (Signed)
82 y/o female with IDA, chronic intermittent left sided pain, diarrhea with work up as outlined. Progression of left sided colon abnormalities since 10/2016. Colonoscopy in 11/2016 with no evidence of malignancy. Ddx includes chronic smoldering diverticulitis, IBD, malignancy. Given no improvement of CT findings, ongoing abdominal pain, diarrhea, weight loss, anorexia, would recommend surgical evaluation for possible partial colectomy. Patient and daughter agreeable.

## 2017-02-12 NOTE — Progress Notes (Signed)
cc'ed to pcp °

## 2017-02-14 NOTE — Progress Notes (Signed)
DISCUSSED AT OV. SURGERY CONSULT PLANNED.

## 2017-02-16 ENCOUNTER — Other Ambulatory Visit: Payer: Self-pay | Admitting: General Surgery

## 2017-02-16 DIAGNOSIS — E440 Moderate protein-calorie malnutrition: Secondary | ICD-10-CM | POA: Diagnosis not present

## 2017-02-16 DIAGNOSIS — R634 Abnormal weight loss: Secondary | ICD-10-CM | POA: Diagnosis not present

## 2017-02-16 DIAGNOSIS — Z9889 Other specified postprocedural states: Secondary | ICD-10-CM | POA: Diagnosis not present

## 2017-02-16 DIAGNOSIS — Z8719 Personal history of other diseases of the digestive system: Secondary | ICD-10-CM | POA: Diagnosis not present

## 2017-02-16 DIAGNOSIS — Z853 Personal history of malignant neoplasm of breast: Secondary | ICD-10-CM | POA: Diagnosis not present

## 2017-02-16 DIAGNOSIS — R413 Other amnesia: Secondary | ICD-10-CM | POA: Diagnosis not present

## 2017-02-16 DIAGNOSIS — K639 Disease of intestine, unspecified: Secondary | ICD-10-CM | POA: Diagnosis not present

## 2017-02-16 DIAGNOSIS — D500 Iron deficiency anemia secondary to blood loss (chronic): Secondary | ICD-10-CM | POA: Diagnosis not present

## 2017-02-16 DIAGNOSIS — N189 Chronic kidney disease, unspecified: Secondary | ICD-10-CM | POA: Diagnosis not present

## 2017-02-16 MED ORDER — SODIUM CHLORIDE 0.9 % IV SOLN: Freq: Once | INTRAVENOUS | Status: AC

## 2017-02-17 DIAGNOSIS — R1032 Left lower quadrant pain: Secondary | ICD-10-CM | POA: Diagnosis not present

## 2017-02-17 DIAGNOSIS — Z6821 Body mass index (BMI) 21.0-21.9, adult: Secondary | ICD-10-CM | POA: Diagnosis not present

## 2017-03-01 NOTE — Patient Instructions (Signed)
[name]  03/01/2017   Your procedure is scheduled on: 03/09/17  Report to [facility] Main  Entrance Take [place]  elevators to 3rd floor to  [place] at      [phone].    Call this number if you have problems the morning of surgery [phone]    Remember: ONLY 1 PERSON MAY GO WITH YOU TO SHORT STAY TO GET  READY MORNING OF YOUR SURGERY.  Do not eat food or drink liquids :After Midnight.     Take these medicines the morning of surgery with A SIP OF WATER: prevastatin, protonix, synthroid                                You may not have any metal on your body including hair pins and              piercings  Do not wear jewelry, make-up, lotions, powders or perfumes, deodorant             Do not wear nail polish.  Do not shave  48 hours prior to surgery.               Do not bring valuables to the hospital. [place].  Contacts, dentures or bridgework may not be worn into surgery.  Leave suitcase in the car. After surgery it may be brought to your room.                 Please read over the following fact sheets you were given: _____________________________________________________________________             [facility] - Preparing for Surgery Before surgery, you can play an important role.  Because skin is not sterile, your skin needs to be as free of germs as possible.  You can reduce the number of germs on your skin by washing with CHG (chlorahexidine gluconate) soap before surgery.  CHG is an antiseptic cleaner which kills germs and bonds with the skin to continue killing germs even after washing. Please DO NOT use if you have an allergy to CHG or antibacterial soaps.  If your skin becomes reddened/irritated stop using the CHG and inform your nurse when you arrive at Short Stay. Do not shave (including legs and underarms) for at least 48 hours prior to the first CHG shower.  You may shave your  face/neck. Please follow these instructions carefully:  1.  Shower with CHG Soap the night before surgery and the  morning of Surgery.  2.  If you choose to wash your hair, wash your hair first as usual with your  normal  shampoo.  3.  After you shampoo, rinse your hair and body thoroughly to remove the  shampoo.                           4.  Use CHG as you would any other liquid soap.  You can apply chg directly  to the skin and wash                       Gently with a scrungie or clean washcloth.  5.  Apply the CHG  Soap to your body ONLY FROM THE NECK DOWN.   Do not use on face/ open                           Wound or open sores. Avoid contact with eyes, ears mouth and genitals (private parts).                       Wash face,  Genitals (private parts) with your normal soap.             6.  Wash thoroughly, paying special attention to the area where your surgery  will be performed.  7.  Thoroughly rinse your body with warm water from the neck down.  8.  DO NOT shower/wash with your normal soap after using and rinsing off  the CHG Soap.                9.  Pat yourself dry with a clean towel.            10.  Wear clean pajamas.            11.  Place clean sheets on your bed the night of your first shower and do not  sleep with pets. Day of Surgery : Do not apply any lotions/deodorants the morning of surgery.  Please wear clean clothes to the hospital/surgery center.  FAILURE TO FOLLOW THESE INSTRUCTIONS MAY RESULT IN THE CANCELLATION OF YOUR SURGERY PATIENT SIGNATURE_________________________________  NURSE SIGNATURE__________________________________  ________________________________________________________________________   [name] PREP PER M.D. INSTRUCTIONS   FOLLOW A CLEAR LIQUID DIET   THE DAY OF YOUR BOWEL PREP    Foods Allowed                                                                     Foods Excluded  Coffee and tea, regular and decaf                              liquids that you cannot  Plain Jell-O in any flavor                                             see through such as: Fruit ices (not with fruit pulp)                                     milk, soups, orange juice  Iced Popsicles                                    All solid food Carbonated beverages, regular and diet                                    Cranberry, grape and apple juices Sports drinks like Gatorade Lightly seasoned clear broth or consume(fat  free) Sugar, honey syrup  Sample Menu Breakfast                                Lunch                                     Supper Cranberry juice                    Beef broth                            Chicken broth Jell-O                                     Grape juice                           Apple juice Coffee or tea                        Jell-O                                      Popsicle                                                Coffee or tea                        Coffee or tea  _____________________________________________________________________   WHAT IS A BLOOD TRANSFUSION? Blood Transfusion Information  A transfusion is the replacement of blood or some of its parts. Blood is made up of multiple cells which provide different functions.  Red blood cells carry oxygen and are used for blood loss replacement.  White blood cells fight against infection.  Platelets control bleeding.  Plasma helps clot blood.  Other blood products are available for specialized needs, such as hemophilia or other clotting disorders. BEFORE THE TRANSFUSION  Who gives blood for transfusions?   Healthy volunteers who are fully evaluated to make sure their blood is safe. This is blood bank blood. Transfusion therapy is the safest it has ever been in the practice of medicine. Before blood is taken from a donor, a complete history is taken to make sure that person has no history of diseases nor engages in risky social behavior (examples are  intravenous drug use or sexual activity with multiple partners). The donor's travel history is screened to minimize risk of transmitting infections, such as malaria. The donated blood is tested for signs of infectious diseases, such as HIV and hepatitis. The blood is then tested to be sure it is compatible with you in order to minimize the chance of a transfusion reaction. If you or a relative donates blood, this is often done in anticipation of surgery and is not appropriate for emergency situations. It takes many days to process the donated blood. RISKS AND COMPLICATIONS Although transfusion therapy is very safe and saves many lives, the main  dangers of transfusion include:   Getting an infectious disease.  Developing a transfusion reaction. This is an allergic reaction to something in the blood you were given. Every precaution is taken to prevent this. The decision to have a blood transfusion has been considered carefully by your caregiver before blood is given. Blood is not given unless the benefits outweigh the risks. AFTER THE TRANSFUSION  Right after receiving a blood transfusion, you will usually feel much better and more energetic. This is especially true if your red blood cells have gotten low (anemic). The transfusion raises the level of the red blood cells which carry oxygen, and this usually causes an energy increase.  The nurse administering the transfusion will monitor you carefully for complications. HOME CARE INSTRUCTIONS  No special instructions are needed after a transfusion. You may find your energy is better. Speak with your caregiver about any limitations on activity for underlying diseases you may have. SEEK MEDICAL CARE IF:   Your condition is not improving after your transfusion.  You develop redness or irritation at the intravenous (IV) site. SEEK IMMEDIATE MEDICAL CARE IF:  Any of the following symptoms occur over the next 12 hours:  Shaking chills.  You have a  temperature by mouth above 102 F (38.9 C), not controlled by medicine.  Chest, back, or muscle pain.  People around you feel you are not acting correctly or are confused.  Shortness of breath or difficulty breathing.  Dizziness and fainting.  You get a rash or develop hives.  You have a decrease in urine output.  Your urine turns a dark color or changes to pink, red, or brown. Any of the following symptoms occur over the next 10 days:  You have a temperature by mouth above 102 F (38.9 C), not controlled by medicine.  Shortness of breath.  Weakness after normal activity.  The white part of the eye turns yellow (jaundice).  You have a decrease in the amount of urine or are urinating less often.  Your urine turns a dark color or changes to pink, red, or brown. Document Released: 08/18/2000 Document Revised: 11/13/2011 Document Reviewed: 04/06/2008 [facility] Patient Information 2014 [place], [place].  _______________________________________________________________________

## 2017-03-02 ENCOUNTER — Encounter (HOSPITAL_COMMUNITY)
Admission: RE | Admit: 2017-03-02 | Discharge: 2017-03-02 | Disposition: A | Discharge status: Home / Self Care | Payer: Medicare Other | Source: Ambulatory Visit | Attending: General Surgery | Admitting: General Surgery

## 2017-03-02 ENCOUNTER — Encounter (HOSPITAL_COMMUNITY): Payer: Self-pay

## 2017-03-02 DIAGNOSIS — N179 Acute kidney failure, unspecified: Secondary | ICD-10-CM | POA: Diagnosis not present

## 2017-03-02 DIAGNOSIS — D500 Iron deficiency anemia secondary to blood loss (chronic): Secondary | ICD-10-CM | POA: Diagnosis not present

## 2017-03-02 DIAGNOSIS — C50919 Malignant neoplasm of unspecified site of unspecified female breast: Secondary | ICD-10-CM | POA: Insufficient documentation

## 2017-03-02 DIAGNOSIS — E039 Hypothyroidism, unspecified: Secondary | ICD-10-CM | POA: Insufficient documentation

## 2017-03-02 DIAGNOSIS — K6389 Other specified diseases of intestine: Secondary | ICD-10-CM | POA: Insufficient documentation

## 2017-03-02 DIAGNOSIS — Z01818 Encounter for other preprocedural examination: Secondary | ICD-10-CM | POA: Diagnosis not present

## 2017-03-02 DIAGNOSIS — Z79899 Other long term (current) drug therapy: Secondary | ICD-10-CM | POA: Diagnosis not present

## 2017-03-02 DIAGNOSIS — I10 Essential (primary) hypertension: Secondary | ICD-10-CM | POA: Diagnosis not present

## 2017-03-02 DIAGNOSIS — K219 Gastro-esophageal reflux disease without esophagitis: Secondary | ICD-10-CM | POA: Insufficient documentation

## 2017-03-02 DIAGNOSIS — M810 Age-related osteoporosis without current pathological fracture: Secondary | ICD-10-CM | POA: Diagnosis not present

## 2017-03-02 LAB — CBC WITH DIFFERENTIAL/PLATELET
Basophils Absolute: 0 10*3/uL (ref 0.0–0.1)
Basophils Relative: 0 %
EOS ABS: 0.2 10*3/uL (ref 0.0–0.7)
EOS PCT: 1 %
HCT: 35.7 % — ABNORMAL LOW (ref 36.0–46.0)
Hemoglobin: 11.6 g/dL — ABNORMAL LOW (ref 12.0–15.0)
LYMPHS ABS: 2.4 10*3/uL (ref 0.7–4.0)
Lymphocytes Relative: 21 %
MCH: 30.1 pg (ref 26.0–34.0)
MCHC: 32.5 g/dL (ref 30.0–36.0)
MCV: 92.5 fL (ref 78.0–100.0)
Monocytes Absolute: 1 10*3/uL (ref 0.1–1.0)
Monocytes Relative: 8 %
Neutro Abs: 8.2 10*3/uL — ABNORMAL HIGH (ref 1.7–7.7)
Neutrophils Relative %: 70 %
PLATELETS: 445 10*3/uL — AB (ref 150–400)
RBC: 3.86 MIL/uL — AB (ref 3.87–5.11)
RDW: 15.4 % (ref 11.5–15.5)
WBC: 11.9 10*3/uL — AB (ref 4.0–10.5)

## 2017-03-02 LAB — ABO/RH: ABO/RH(D): A POS

## 2017-03-02 LAB — URINALYSIS, ROUTINE W REFLEX MICROSCOPIC
BILIRUBIN URINE: NEGATIVE
GLUCOSE, UA: NEGATIVE mg/dL
Ketones, ur: NEGATIVE mg/dL
NITRITE: NEGATIVE
PH: 5 (ref 5.0–8.0)
Protein, ur: NEGATIVE mg/dL
SPECIFIC GRAVITY, URINE: 1.02 (ref 1.005–1.030)

## 2017-03-02 LAB — COMPREHENSIVE METABOLIC PANEL
ALT: 17 U/L (ref 14–54)
ANION GAP: 7 (ref 5–15)
AST: 31 U/L (ref 15–41)
Albumin: 3 g/dL — ABNORMAL LOW (ref 3.5–5.0)
Alkaline Phosphatase: 97 U/L (ref 38–126)
BUN: 19 mg/dL (ref 6–20)
CHLORIDE: 105 mmol/L (ref 101–111)
CO2: 26 mmol/L (ref 22–32)
CREATININE: 0.72 mg/dL (ref 0.44–1.00)
Calcium: 8.5 mg/dL — ABNORMAL LOW (ref 8.9–10.3)
GFR calc non Af Amer: >60 mL/min (ref >60)
Glucose, Bld: 81 mg/dL (ref 65–99)
Potassium: 4 mmol/L (ref 3.5–5.1)
SODIUM: 138 mmol/L (ref 135–145)
Total Bilirubin: 0.5 mg/dL (ref 0.3–1.2)
Total Protein: 6.9 g/dL (ref 6.5–8.1)

## 2017-03-02 LAB — PROTIME-INR
INR: 0.98
Prothrombin Time: 13 seconds (ref 11.4–15.2)

## 2017-03-02 LAB — APTT: APTT: 34 s (ref 24–36)

## 2017-03-02 NOTE — Consult Note (Signed)
[facility] Nurse ostomy consult note  [facility] Nurse requested for preoperative stoma site marking by Dr.Haywood Dalbert Batman.  Discussed surgical procedure and stoma creation with patient and family member.  Explained role of the [facility] nurse team.  Answered patient and family member's questions. Patient states that her grandmother had an ostomy many years ago, did not wear a pouch and managed with cloths covering the stoma (which was the norm for the time). Patient is a 12-year survivor of breat cancer; was treated with radiation therapy and chemotherapy. They decline written information regarding an ostomy as they are hopeful one is not necessary.   Examined patient sitting and standing in order to place the marking in the patient's visual field, away from any creases or abdominal contour issues and within the rectus muscle. There are numerous skin creases and folds in both the upper and lower quadrants, more in LLQ. Patient's abdomen is flaccid .   First Choice:  Marked for colostomy in the LUQ  6.5cm to the left of the umbilicus and 6.2HU above the umbilicus.  If a LLQ site is desired, second choice would be 6.5cm to the left of the umbilicus and 7.6LY below.  Patient's abdomen cleansed with CHG wipes at site markings, allowed to air dry prior to marking.Covered first choice mark with thin film transparent dressing to preserve mark until date of surgery (Friday, July 6th).   [facility] nursing team will follow to see if an ostomy is created intraoperatively. Please re-consult if an ostomy is created.  Thank you for inviting [place] to meet and mark this patient prior to her surgical date.  Maudie Flakes, MSN, RN, [place], [facility]  Pager# [phone]

## 2017-03-02 NOTE — Progress Notes (Signed)
ekg 08/24/16 epic Ct abd and pelvis 02/07/17 epic

## 2017-03-03 LAB — HEMOGLOBIN A1C
Hgb A1c MFr Bld: 4.7 % — ABNORMAL LOW (ref 4.8–5.6)
MEAN PLASMA GLUCOSE: 88 mg/dL

## 2017-03-08 NOTE — H&P (Signed)
[name]  Location: [facility] Surgery Patient #: [id number] DOB: October 04, 1934 Widowed / Language: [name] / Race: White Female        History and Physical      The patient is a 82 year old female who presents with a colonic mass. This is a 82 year old female who is here with her daughter to discuss colon surgery. She is referred by Dr. Garfield Cornea in [place]. Her PCP is Dr. [name]. She is also followed at the [place] [facility] for anemia and remote history of left breast cancer..       It sounds like she has her several episodes of diverticulitis over the years. Treated with antibiotics. Over the past year she's been having left-sided abdominal pain after she eats and has to run to the bathroom urgently. Stools are not watery. More soft. C. difficile has been checked and is negative. She has an anemia and has had iron infusions. She has lost 16 pounds in the last 6 weeks. Probably has lost 40 pounds in the last year and she is lost 100 pounds since 12-29-12 when her husband died. She denies vomiting. CT scan performed on February 07, 2017 shows persistent long segment irregular thickening of the descending colon and proximal sigmoid colon, worrisome some for colonic malignancy. Looks like a mass to me but hopefully this is just burned out diverticulitis. Also a little bit of wall thickening of the splenic flexure and distal transverse colon thought to represent colitis. No evidence for bowel obstruction. Hypodense lesions in the spleen which have been noted before. Renal cysts. Colonoscopy was performed on November 17, 2016. Normal rectum. Colonoscopy got all the way to the cecum. Four pedunculated polyps in ascending colon which showed tubular adenoma. Pan colonic diverticula. Abnormal 20 cm segment of left colon lumen narrowed. Edematous. Did not appear neoplastic. Biopsies showed moderately active chronic colitis but no dysplasia. On November 17, 2016  hemoglobin 9.2. WBC 13,200.      Past history reveals hemorrhoidectomy. Cervical spine surgery. Iron deficiency anemia. Rare incontinence. CK D. GERD. Left breast cancer Dec 29, 2001 treated with lumpectomy and sentinel node at [facility]. Parathyroid surgery. Thyroid surgery 3. Memory problems, mild. Laparoscopic cholecystectomy and Nissen fundoplication 0973. No records. Review of systems reveals she has no chest pain or shortness of breath. She can walk up one flight of stairs. She has some chronic knee pain. No history of coronary disease, pulmonary disease, or urologic disease. Weight loss mentioned above. Family history mother had pancreatic cancer. Great-grandmother had breast cancer. Social history lives with her daughter. She says she drives but not often. Her husband died in Dec 29, 2012. Denies alcohol or tobacco ever.  We had a long talk, over 1 hour. I discussed the anatomic findings on CT and colonoscopy. Told them that she had a partial obstruction from a stricture in the descending and sigmoid colon. Hopefully this is benign inflammatory disease but cancer is possible and this was explained. The only reasonable option is to undergo bowel prep and elective colon resection, possible colostomy. She understands and agrees     This is high risk due to her anemia, protein calorie malnutrition, altered immune system, memory disorder, partial obstruction. I do not think that we can mitigate her risk in the interim. The patient is referred back to her PCP, Dr. [name], for a complete history and physical and risk assessment for surgery. Hopefully this can be done soon She will take boost nutritional supplements 3 times a  day and continue multivitamins daily I encouraged her to take a 30 minute walk every day to increase endurance She will be scheduled for laparoscopic-assisted left colectomy. Suspect this will be converted to open due to inflammation and  rigidity of the colon She will undergo mechanical and antibiotic bowel prep. Antibiotics will call to her pharmacy today. She was given the bowel prep orders and her daughter was educated about this as well      I discussed the indications, details, techniques, and numerous risk of the surgery with the patient and her daughter. They're aware of the risks of bleeding, infection, conversion to open laparotomy, wound healing problems such as hernia or dehiscence, injury to adjacent organs such as the ureter or bladder or major vascular structures or spleen. They're aware of the risk of anastomotic leak and dehiscence requiring reoperation and management of sepsis. They are aware that if I feel that the situation is unfavorable that I will do a temporary colostomy. They're aware that her memory problems may get worse and she may develop encephalopathy. I told him that there is a possibility she might have to be discharged to a nursing home. Risk of cardiac pulmonary and thromboembolic problems were also discussed. They understand these issues well. All of her questions are answered. She is competent to make her own decisions. She agrees with this plan.  Alvimopan ordered Cross match 2 units PRBC ordered  Orders entered into Epic. Posting sheet completed     Past Surgical History  Breast Biopsy  Left. Breast Mass; Local Excision  Left. Cataract Surgery  Bilateral. Colon Polyp Removal - Colonoscopy  Gallbladder Surgery - Open  Hysterectomy (not due to cancer) - Complete  Knee Surgery  Bilateral. Oral Surgery  Shoulder Surgery  Left. Thyroid Surgery   Diagnostic Studies History Colonoscopy  within last year Mammogram  within last year Pap Smear  >5 years ago   Metronidazole (Topical) *DERMATOLOGICALS*  OxyCODONE HCl (Abuse Deter) *ANALGESICS - OPIOID*  Ciprofloxacin *FLUOROQUINOLONES*  Latex   Medication History  Acetaminophen (650MG  Tablet, Oral as  needed) Active. Calcium (Oral) Specific strength unknown - Active. Levothyroxine Sodium (88MCG Tablet, Oral) Active. bone infusion (twice a year) Active. Multi Vitamin (Oral) Active. Pravastatin Sodium (20MG  Tablet, Oral) Active. Medications Reconciled  Social History Caffeine use  Carbonated beverages. No alcohol use  No drug use  Tobacco use  Never smoker.   Alcohol Abuse  Father, Mother. Heart Disease  Daughter, Father.  Pregnancy / Birth History  Age at menarche  51 years. Age of menopause  89-55 Gravida  77 Maternal age  65-20 Para  4  Other Problems  Arthritis  Back Pain  Breast Cancer  Cholelithiasis  Diverticulosis  Gastroesophageal Reflux Disease  Heart murmur  Hemorrhoids  Hypercholesterolemia  Kidney Stone  Lump In Breast  Thyroid Disease     Review of Systems  General Present- Appetite Loss, Fatigue and Weight Loss. Not Present- Chills, Fever, Night Sweats and Weight Gain. Skin Present- Non-Healing Wounds. Not Present- Change in Wart/Mole, Dryness, Hives, Jaundice, New Lesions, Rash and Ulcer. HEENT Present- Hearing Loss. Not Present- Earache, Hoarseness, Nose Bleed, Oral Ulcers, Ringing in the Ears, Seasonal Allergies, Sinus Pain, Sore Throat, Visual Disturbances, Wears glasses/contact lenses and Yellow Eyes. Respiratory Not Present- Bloody sputum, Chronic Cough, Difficulty Breathing, Snoring and Wheezing. Breast Not Present- Breast Mass, Breast Pain, Nipple Discharge and Skin Changes. Cardiovascular Present- Swelling of Extremities. Not Present- Chest Pain, Difficulty Breathing Lying Down, Leg Cramps, Palpitations, Rapid Heart Rate and  Shortness of Breath. Gastrointestinal Present- Abdominal Pain, Change in Bowel Habits, Chronic diarrhea, Hemorrhoids and Rectal Pain. Not Present- Bloating, Bloody Stool, Constipation, Difficulty Swallowing, Excessive gas, Gets full quickly at meals, Indigestion, Nausea and Vomiting. Female  Genitourinary Not Present- Frequency, Nocturia, Painful Urination, Pelvic Pain and Urgency. Musculoskeletal Present- Back Pain and Joint Pain. Not Present- Joint Stiffness, Muscle Pain, Muscle Weakness and Swelling of Extremities. Neurological Present- Decreased Memory. Not Present- Fainting, Headaches, Numbness, Seizures, Tingling, Tremor, Trouble walking and Weakness. Psychiatric Not Present- Anxiety, Bipolar, Change in Sleep Pattern, Depression, Fearful and Frequent crying. Endocrine Not Present- Cold Intolerance, Excessive Hunger, Hair Changes, Heat Intolerance, Hot flashes and New Diabetes. Hematology Present- Easy Bruising. Not Present- Blood Thinners, Excessive bleeding, Gland problems, HIV and Persistent Infections.  Vitals  Weight: 131.8 lb Height: 66in Body Surface Area: 1.68 m Body Mass Index: 21.27 kg/m  Temp.: 97.31F  Pulse: 80 (Regular)  BP: 132/86 (Sitting, Left Arm, Standard)    Physical Exam General Mental Status-Alert. General Appearance-Consistent with stated age. Hydration-Well hydrated. Voice-Normal.  Head and Neck Head-normocephalic, atraumatic with no lesions or palpable masses. Trachea-midline. Thyroid Gland Characteristics - normal size and consistency. Note: Well healed transverse thyroidectomy scar. No adenopathy or mass in the neck. Dentures   Eye Eyeball - Bilateral-Extraocular movements intact. Sclera/Conjunctiva - Bilateral-No scleral icterus.  Chest and Lung Exam Chest and lung exam reveals -quiet, even and easy respiratory effort with no use of accessory muscles and on auscultation, normal breath sounds, no adventitious sounds and normal vocal resonance. Inspection Chest Wall - Normal. Back - normal.  Cardiovascular Cardiovascular examination reveals -normal heart sounds, regular rate and rhythm with no murmurs and normal pedal pulses bilaterally.  Abdomen Note: Abdomen is soft. It doesn't appear  distended. Liver and spleen not felt. Somewhat tender fullness and almost mass effect along the descending colon and the left lower quadrant consistent with inflammatory colon disease. No guarding or rebound, however. Well-healed transverse lower abdominal incisional hernia. Well-healed trocar sites in the upper abdomen.   Peripheral Vascular Note: Radial, femoral, and dorsalis pedis pulses are easily palpable. 1+ ankle edema bilaterally   Neurologic Neurologic evaluation reveals -alert and oriented x 3 with no impairment of recent or remote memory. Mental Status-Normal.  Musculoskeletal Normal Exam - Left-Upper Extremity Strength Normal and Lower Extremity Strength Normal. Normal Exam - Right-Upper Extremity Strength Normal and Lower Extremity Strength Normal.  Lymphatic Head & Neck  General Head & Neck Lymphatics: Bilateral - Description - Normal. Axillary  General Axillary Region: Bilateral - Description - Normal. Tenderness - Non Tender. Femoral & Inguinal  Generalized Femoral & Inguinal Lymphatics: Bilateral - Description - Normal. Tenderness - Non Tender.    Assessment & Plan  MASS OF COLON (K63.9)  Current Plans Started Neomycin Sulfate 500MG , 2 (two) Tablet SEE NOTE, #6, 02/16/2017, No Refill. Local Order: TAKE TWO TABLETS AT 2 PM, 3 PM, AND 10 PM THE DAY PRIOR TO SURGERY Started Flagyl 500MG , 2 (two) Tablet SEE NOTE, #6, 02/16/2017, No Refill. Local Order: Take at 2pm, 3pm, and 10pm the day prior to your colon operation You are being scheduled for surgery- Our schedulers will call you.    You have a mass of your descending colon on the left side There is a narrowing and a partial obstruction in this location This may be due to your multiple bouts of diverticulitis in the past Your colonoscopy does not show any cancer There is still a possibility of cancer  Because of the partial obstruction you  will need an operation This is moderately high risk due  to your weight loss, malnutrition, anemia, memory disorder and partial obstruction. The surgery will need to be done because of your pain and obstruction, which will only get worse.  You'll be scheduled for laparoscopic assisted left colectomy, possible open colectomy, possible colostomy We have discussed the indications, techniques, and risk of this surgery in detail with you and your daughter   You need to make an office visit with your primary doctor, Dr. [name], immediately, for a complete preoperative risk assessment I recommended you take boost nutritional supplement 3 times a day starting immediately Continue taking multivitamins daily Take a 30 minute walk every day to increase endurance  The surgery will be scheduled in the near future You will need to take a mechanical bowel prep the day before surgery You will need to take antibiotic pills the day before surgery We will go over these instructions with you in detail  Please read the patient information booklets that I reviewed and gave you  PROTEIN-CALORIE MALNUTRITION, MODERATE (E44.0) IRON DEFICIENCY ANEMIA SECONDARY TO BLOOD LOSS (CHRONIC) (D50.0) HISTORY OF LEFT BREAST CANCER (Z85.3) WEIGHT LOSS OF MORE THAN 10% BODY WEIGHT (R63.4) MEMORY DISORDER (R41.3) HISTORY OF REPAIR OF HIATAL HERNIA (Z98.890) HISTORY OF DIVERTICULITIS OF COLON (Z87.19) CKD (CHRONIC KIDNEY DISEASE) (N18.9)    Edsel Petrin. Dalbert Batman, M.D., [facility] General and Minimally invasive Surgery Breast and Colorectal Surgery Office:   [phone] Pager:   [phone]

## 2017-03-09 ENCOUNTER — Encounter (HOSPITAL_COMMUNITY): Payer: Self-pay | Admitting: *Deleted

## 2017-03-09 ENCOUNTER — Inpatient Hospital Stay (HOSPITAL_COMMUNITY): Payer: Medicare Other | Admitting: Registered Nurse

## 2017-03-09 ENCOUNTER — Encounter (HOSPITAL_COMMUNITY)
Admission: RE | Disposition: A | Discharge status: Home / Self Care | Payer: Self-pay | Source: Ambulatory Visit | Attending: General Surgery

## 2017-03-09 ENCOUNTER — Ambulatory Visit: Payer: Medicare Other | Admitting: Gastroenterology

## 2017-03-09 ENCOUNTER — Inpatient Hospital Stay (HOSPITAL_COMMUNITY)
Admission: RE | Admit: 2017-03-09 | Discharge: 2017-03-13 | DRG: 330 | Disposition: A | Discharge status: Home Health | Payer: Medicare Other | Source: Ambulatory Visit | Attending: General Surgery | Admitting: General Surgery

## 2017-03-09 DIAGNOSIS — K56600 Partial intestinal obstruction, unspecified as to cause: Secondary | ICD-10-CM | POA: Diagnosis present

## 2017-03-09 DIAGNOSIS — Z881 Allergy status to other antibiotic agents status: Secondary | ICD-10-CM | POA: Diagnosis not present

## 2017-03-09 DIAGNOSIS — Z5331 Laparoscopic surgical procedure converted to open procedure: Secondary | ICD-10-CM

## 2017-03-09 DIAGNOSIS — Z853 Personal history of malignant neoplasm of breast: Secondary | ICD-10-CM | POA: Diagnosis not present

## 2017-03-09 DIAGNOSIS — I10 Essential (primary) hypertension: Secondary | ICD-10-CM | POA: Diagnosis present

## 2017-03-09 DIAGNOSIS — K6389 Other specified diseases of intestine: Secondary | ICD-10-CM | POA: Diagnosis not present

## 2017-03-09 DIAGNOSIS — D500 Iron deficiency anemia secondary to blood loss (chronic): Secondary | ICD-10-CM | POA: Diagnosis present

## 2017-03-09 DIAGNOSIS — Z885 Allergy status to narcotic agent status: Secondary | ICD-10-CM

## 2017-03-09 DIAGNOSIS — K5732 Diverticulitis of large intestine without perforation or abscess without bleeding: Secondary | ICD-10-CM | POA: Diagnosis present

## 2017-03-09 DIAGNOSIS — Z91040 Latex allergy status: Secondary | ICD-10-CM

## 2017-03-09 DIAGNOSIS — E7800 Pure hypercholesterolemia, unspecified: Secondary | ICD-10-CM | POA: Diagnosis present

## 2017-03-09 DIAGNOSIS — E039 Hypothyroidism, unspecified: Secondary | ICD-10-CM | POA: Diagnosis present

## 2017-03-09 DIAGNOSIS — K5792 Diverticulitis of intestine, part unspecified, without perforation or abscess without bleeding: Secondary | ICD-10-CM | POA: Diagnosis present

## 2017-03-09 DIAGNOSIS — D124 Benign neoplasm of descending colon: Secondary | ICD-10-CM | POA: Diagnosis not present

## 2017-03-09 DIAGNOSIS — K219 Gastro-esophageal reflux disease without esophagitis: Secondary | ICD-10-CM | POA: Diagnosis present

## 2017-03-09 DIAGNOSIS — D122 Benign neoplasm of ascending colon: Secondary | ICD-10-CM | POA: Diagnosis present

## 2017-03-09 DIAGNOSIS — Z79899 Other long term (current) drug therapy: Secondary | ICD-10-CM

## 2017-03-09 DIAGNOSIS — E440 Moderate protein-calorie malnutrition: Secondary | ICD-10-CM | POA: Diagnosis present

## 2017-03-09 DIAGNOSIS — Z888 Allergy status to other drugs, medicaments and biological substances status: Secondary | ICD-10-CM

## 2017-03-09 DIAGNOSIS — Z6820 Body mass index (BMI) 20.0-20.9, adult: Secondary | ICD-10-CM

## 2017-03-09 DIAGNOSIS — Z9049 Acquired absence of other specified parts of digestive tract: Secondary | ICD-10-CM

## 2017-03-09 DIAGNOSIS — D62 Acute posthemorrhagic anemia: Secondary | ICD-10-CM | POA: Diagnosis present

## 2017-03-09 DIAGNOSIS — K5720 Diverticulitis of large intestine with perforation and abscess without bleeding: Secondary | ICD-10-CM | POA: Diagnosis not present

## 2017-03-09 HISTORY — PX: LAPAROSCOPIC PARTIAL COLECTOMY: SHX5907

## 2017-03-09 HISTORY — PX: PROCTOSCOPY: SHX2266

## 2017-03-09 LAB — TYPE AND SCREEN
ABO/RH(D): A POS
Antibody Screen: NEGATIVE

## 2017-03-09 LAB — CBC
HCT: 31.9 % — ABNORMAL LOW (ref 36.0–46.0)
Hemoglobin: 10.3 g/dL — ABNORMAL LOW (ref 12.0–15.0)
MCH: 29.9 pg (ref 26.0–34.0)
MCHC: 32.3 g/dL (ref 30.0–36.0)
MCV: 92.7 fL (ref 78.0–100.0)
PLATELETS: 368 10*3/uL (ref 150–400)
RBC: 3.44 MIL/uL — AB (ref 3.87–5.11)
RDW: 14.8 % (ref 11.5–15.5)
WBC: 21.7 10*3/uL — AB (ref 4.0–10.5)

## 2017-03-09 LAB — MRSA PCR SCREENING: MRSA BY PCR: NEGATIVE

## 2017-03-09 LAB — POTASSIUM: POTASSIUM: 3.6 mmol/L (ref 3.5–5.1)

## 2017-03-09 LAB — HEMOGLOBIN AND HEMATOCRIT, BLOOD
HEMATOCRIT: 32.3 % — AB (ref 36.0–46.0)
HEMOGLOBIN: 10.4 g/dL — AB (ref 12.0–15.0)

## 2017-03-09 LAB — CREATININE, SERUM
CREATININE: 1.16 mg/dL — AB (ref 0.44–1.00)
GFR calc non Af Amer: 44 mL/min — ABNORMAL LOW (ref >60)
GFR, EST AFRICAN AMERICAN: 51 mL/min — AB (ref >60)

## 2017-03-09 SURGERY — LAPAROSCOPIC PARTIAL COLECTOMY
Anesthesia: General | Site: Rectum

## 2017-03-09 MED ORDER — PROPOFOL 10 MG/ML IV BOLUS
INTRAVENOUS | Status: AC
  Filled 2017-03-09: qty 20

## 2017-03-09 MED ORDER — HYDROMORPHONE HCL-NACL 0.5-0.9 MG/ML-% IV SOSY
PREFILLED_SYRINGE | INTRAVENOUS | Status: AC
  Administered 2017-03-09: 0.25 mg via INTRAVENOUS
  Filled 2017-03-09: qty 1

## 2017-03-09 MED ORDER — ONDANSETRON HCL 4 MG PO TABS: 4.0000 mg | ORAL_TABLET | Freq: Four times a day (QID) | ORAL | Status: DC | PRN

## 2017-03-09 MED ORDER — ONDANSETRON HCL 4 MG/2ML IJ SOLN
INTRAMUSCULAR | Status: DC | PRN
  Administered 2017-03-09: 4 mg via INTRAVENOUS

## 2017-03-09 MED ORDER — SUGAMMADEX SODIUM 200 MG/2ML IV SOLN
INTRAVENOUS | Status: DC | PRN
  Administered 2017-03-09: 150 mg via INTRAVENOUS

## 2017-03-09 MED ORDER — ONDANSETRON HCL 4 MG/2ML IJ SOLN
4.0000 mg | Freq: Four times a day (QID) | INTRAMUSCULAR | Status: DC | PRN
  Administered 2017-03-09: 4 mg via INTRAVENOUS
  Filled 2017-03-09: qty 2

## 2017-03-09 MED ORDER — CEFOTETAN DISODIUM-DEXTROSE 2-2.08 GM-% IV SOLR
INTRAVENOUS | Status: AC
  Filled 2017-03-09: qty 50

## 2017-03-09 MED ORDER — LACTATED RINGERS IV SOLN
INTRAVENOUS | Status: DC | PRN
  Administered 2017-03-09 (×2): via INTRAVENOUS

## 2017-03-09 MED ORDER — PROMETHAZINE HCL 25 MG/ML IJ SOLN: 6.2500 mg | INTRAMUSCULAR | Status: DC | PRN

## 2017-03-09 MED ORDER — ENOXAPARIN SODIUM 40 MG/0.4ML ~~LOC~~ SOLN
40.0000 mg | SUBCUTANEOUS | Status: DC
  Administered 2017-03-10 – 2017-03-13 (×4): 40 mg via SUBCUTANEOUS
  Filled 2017-03-09 (×3): qty 0.4

## 2017-03-09 MED ORDER — CHLORHEXIDINE GLUCONATE 4 % EX LIQD: 60.0000 mL | Freq: Once | CUTANEOUS | Status: DC

## 2017-03-09 MED ORDER — PHENYLEPHRINE 40 MCG/ML (10ML) SYRINGE FOR IV PUSH (FOR BLOOD PRESSURE SUPPORT)
PREFILLED_SYRINGE | INTRAVENOUS | Status: DC | PRN
  Administered 2017-03-09: 80 ug via INTRAVENOUS

## 2017-03-09 MED ORDER — PANTOPRAZOLE SODIUM 40 MG PO TBEC
40.0000 mg | DELAYED_RELEASE_TABLET | Freq: Every day | ORAL | Status: DC
Start: 2017-03-10 — End: 2017-03-13
  Administered 2017-03-10 – 2017-03-13 (×4): 40 mg via ORAL
  Filled 2017-03-09 (×4): qty 1

## 2017-03-09 MED ORDER — CENTRUM SILVER ADULT 50+ PO TABS: ORAL_TABLET | Freq: Every day | ORAL | Status: DC

## 2017-03-09 MED ORDER — LIDOCAINE 2% (20 MG/ML) 5 ML SYRINGE
INTRAMUSCULAR | Status: AC
  Filled 2017-03-09: qty 5

## 2017-03-09 MED ORDER — BUPIVACAINE-EPINEPHRINE (PF) 0.25% -1:200000 IJ SOLN
INTRAMUSCULAR | Status: AC
  Filled 2017-03-09: qty 30

## 2017-03-09 MED ORDER — SACCHAROMYCES BOULARDII 250 MG PO CAPS
250.0000 mg | ORAL_CAPSULE | Freq: Two times a day (BID) | ORAL | Status: DC
  Administered 2017-03-09 – 2017-03-13 (×8): 250 mg via ORAL
  Filled 2017-03-09 (×8): qty 1

## 2017-03-09 MED ORDER — EPHEDRINE 5 MG/ML INJ
INTRAVENOUS | Status: AC
  Filled 2017-03-09: qty 10

## 2017-03-09 MED ORDER — HYDROCODONE-ACETAMINOPHEN 5-325 MG PO TABS
1.0000 | ORAL_TABLET | ORAL | Status: DC | PRN
  Administered 2017-03-10: 2 via ORAL
  Administered 2017-03-10 – 2017-03-12 (×5): 1 via ORAL
  Filled 2017-03-09 (×3): qty 1
  Filled 2017-03-09: qty 2
  Filled 2017-03-09 (×2): qty 1

## 2017-03-09 MED ORDER — ROCURONIUM BROMIDE 50 MG/5ML IV SOSY
PREFILLED_SYRINGE | INTRAVENOUS | Status: AC
  Filled 2017-03-09: qty 5

## 2017-03-09 MED ORDER — FENTANYL CITRATE (PF) 100 MCG/2ML IJ SOLN
INTRAMUSCULAR | Status: DC | PRN
  Administered 2017-03-09 (×5): 50 ug via INTRAVENOUS

## 2017-03-09 MED ORDER — DEXTROSE 5 % IV SOLN
2.0000 g | Freq: Two times a day (BID) | INTRAVENOUS | Status: AC
  Administered 2017-03-09: 2 g via INTRAVENOUS
  Filled 2017-03-09: qty 2

## 2017-03-09 MED ORDER — LIDOCAINE 2% (20 MG/ML) 5 ML SYRINGE
INTRAMUSCULAR | Status: DC | PRN
  Administered 2017-03-09: 100 mg via INTRAVENOUS

## 2017-03-09 MED ORDER — PROPOFOL 10 MG/ML IV BOLUS
INTRAVENOUS | Status: DC | PRN
  Administered 2017-03-09: 110 mg via INTRAVENOUS

## 2017-03-09 MED ORDER — HYDROMORPHONE HCL-NACL 0.5-0.9 MG/ML-% IV SOSY
0.2500 mg | PREFILLED_SYRINGE | INTRAVENOUS | Status: DC | PRN
  Administered 2017-03-09 (×2): 0.25 mg via INTRAVENOUS

## 2017-03-09 MED ORDER — ROCURONIUM BROMIDE 10 MG/ML (PF) SYRINGE
PREFILLED_SYRINGE | INTRAVENOUS | Status: DC | PRN
  Administered 2017-03-09: 40 mg via INTRAVENOUS
  Administered 2017-03-09 (×3): 10 mg via INTRAVENOUS

## 2017-03-09 MED ORDER — ADULT MULTIVITAMIN W/MINERALS CH
1.0000 | ORAL_TABLET | Freq: Every day | ORAL | Status: DC
  Administered 2017-03-10 – 2017-03-13 (×4): 1 via ORAL
  Filled 2017-03-09 (×4): qty 1

## 2017-03-09 MED ORDER — EPHEDRINE SULFATE-NACL 50-0.9 MG/10ML-% IV SOSY
PREFILLED_SYRINGE | INTRAVENOUS | Status: DC | PRN
  Administered 2017-03-09: 5 mg via INTRAVENOUS

## 2017-03-09 MED ORDER — BUPIVACAINE-EPINEPHRINE (PF) 0.5% -1:200000 IJ SOLN
INTRAMUSCULAR | Status: DC | PRN
  Administered 2017-03-09: 18 mL

## 2017-03-09 MED ORDER — MIRTAZAPINE 15 MG PO TABS
15.0000 mg | ORAL_TABLET | Freq: Every day | ORAL | Status: DC
  Administered 2017-03-09 – 2017-03-12 (×4): 15 mg via ORAL
  Filled 2017-03-09 (×4): qty 1

## 2017-03-09 MED ORDER — DEXAMETHASONE SODIUM PHOSPHATE 10 MG/ML IJ SOLN
INTRAMUSCULAR | Status: AC
  Filled 2017-03-09: qty 1

## 2017-03-09 MED ORDER — ACETAMINOPHEN ER 650 MG PO TBCR: 650.0000 mg | EXTENDED_RELEASE_TABLET | Freq: Three times a day (TID) | ORAL | Status: DC | PRN

## 2017-03-09 MED ORDER — HYDROMORPHONE HCL-NACL 0.5-0.9 MG/ML-% IV SOSY
0.5000 mg | PREFILLED_SYRINGE | INTRAVENOUS | Status: DC | PRN
  Administered 2017-03-09 (×2): 1 mg via INTRAVENOUS
  Filled 2017-03-09 (×2): qty 2

## 2017-03-09 MED ORDER — ALVIMOPAN 12 MG PO CAPS
12.0000 mg | ORAL_CAPSULE | Freq: Two times a day (BID) | ORAL | Status: DC
  Administered 2017-03-10 (×2): 12 mg via ORAL
  Filled 2017-03-09 (×5): qty 1

## 2017-03-09 MED ORDER — PRAVASTATIN SODIUM 20 MG PO TABS
20.0000 mg | ORAL_TABLET | Freq: Every day | ORAL | Status: DC
  Administered 2017-03-10 – 2017-03-13 (×4): 20 mg via ORAL
  Filled 2017-03-09 (×4): qty 1

## 2017-03-09 MED ORDER — LACTATED RINGERS IV SOLN: INTRAVENOUS | Status: DC

## 2017-03-09 MED ORDER — BUPIVACAINE-EPINEPHRINE (PF) 0.5% -1:200000 IJ SOLN
INTRAMUSCULAR | Status: AC
  Filled 2017-03-09: qty 30

## 2017-03-09 MED ORDER — CELECOXIB 200 MG PO CAPS
400.0000 mg | ORAL_CAPSULE | ORAL | Status: AC
  Administered 2017-03-09: 400 mg via ORAL
  Filled 2017-03-09: qty 2

## 2017-03-09 MED ORDER — ACETAMINOPHEN 325 MG PO TABS: 650.0000 mg | ORAL_TABLET | Freq: Three times a day (TID) | ORAL | Status: DC | PRN

## 2017-03-09 MED ORDER — LACTATED RINGERS IV SOLN
INTRAVENOUS | Status: DC
  Administered 2017-03-09 – 2017-03-11 (×3): via INTRAVENOUS

## 2017-03-09 MED ORDER — LACTATED RINGERS IR SOLN
Status: DC | PRN
  Administered 2017-03-09: 1000 mL

## 2017-03-09 MED ORDER — DEXTROSE 5 % IV SOLN
2.0000 g | INTRAVENOUS | Status: AC
  Administered 2017-03-09: 2 g via INTRAVENOUS
  Filled 2017-03-09: qty 2

## 2017-03-09 MED ORDER — ONDANSETRON HCL 4 MG/2ML IJ SOLN
INTRAMUSCULAR | Status: AC
  Filled 2017-03-09: qty 2

## 2017-03-09 MED ORDER — ACETAMINOPHEN 500 MG PO TABS
1000.0000 mg | ORAL_TABLET | ORAL | Status: AC
  Administered 2017-03-09: 1000 mg via ORAL
  Filled 2017-03-09: qty 2

## 2017-03-09 MED ORDER — DEXAMETHASONE SODIUM PHOSPHATE 10 MG/ML IJ SOLN
INTRAMUSCULAR | Status: DC | PRN
  Administered 2017-03-09: 10 mg via INTRAVENOUS

## 2017-03-09 MED ORDER — FENTANYL CITRATE (PF) 250 MCG/5ML IJ SOLN
INTRAMUSCULAR | Status: AC
  Filled 2017-03-09: qty 5

## 2017-03-09 MED ORDER — ALVIMOPAN 12 MG PO CAPS
12.0000 mg | ORAL_CAPSULE | ORAL | Status: AC
  Administered 2017-03-09: 12 mg via ORAL
  Filled 2017-03-09: qty 1

## 2017-03-09 MED ORDER — SODIUM CHLORIDE 0.9 % IR SOLN
Status: DC | PRN
  Administered 2017-03-09: 7000 mL

## 2017-03-09 MED ORDER — LEVOTHYROXINE SODIUM 88 MCG PO TABS
88.0000 ug | ORAL_TABLET | Freq: Every day | ORAL | Status: DC
  Administered 2017-03-10 – 2017-03-13 (×4): 88 ug via ORAL
  Filled 2017-03-09 (×4): qty 1

## 2017-03-09 MED ORDER — GABAPENTIN 300 MG PO CAPS
300.0000 mg | ORAL_CAPSULE | ORAL | Status: AC
  Administered 2017-03-09: 300 mg via ORAL
  Filled 2017-03-09: qty 1

## 2017-03-09 MED ORDER — FENTANYL CITRATE (PF) 100 MCG/2ML IJ SOLN
INTRAMUSCULAR | Status: AC
Start: 2017-03-09 — End: 2017-03-09
  Filled 2017-03-09: qty 2

## 2017-03-09 MED ORDER — FENTANYL CITRATE (PF) 100 MCG/2ML IJ SOLN
INTRAMUSCULAR | Status: AC
  Filled 2017-03-09: qty 2

## 2017-03-09 SURGICAL SUPPLY — 74 items
APPLIER CLIP 5 13 M/L LIGAMAX5 (MISCELLANEOUS)
APPLIER CLIP ROT 10 11.4 M/L (STAPLE)
APR CLP MED LRG 11.4X10 (STAPLE)
APR CLP MED LRG 5 ANG JAW (MISCELLANEOUS)
BLADE EXTENDED COATED 6.5IN (ELECTRODE) IMPLANT
CABLE HIGH FREQUENCY MONO STRZ (ELECTRODE) ×3 IMPLANT
CELLS DAT CNTRL 66122 CELL SVR (MISCELLANEOUS) IMPLANT
CLIP APPLIE 5 13 M/L LIGAMAX5 (MISCELLANEOUS) IMPLANT
CLIP APPLIE ROT 10 11.4 M/L (STAPLE) IMPLANT
DECANTER SPIKE VIAL GLASS SM (MISCELLANEOUS) ×2 IMPLANT
DRAIN CHANNEL 19F RND (DRAIN) ×3 IMPLANT
DRAPE LAPAROSCOPIC ABDOMINAL (DRAPES) ×3 IMPLANT
DRAPE UTILITY XL STRL (DRAPES) IMPLANT
DRSG OPSITE POSTOP 4X10 (GAUZE/BANDAGES/DRESSINGS) ×1 IMPLANT
DRSG OPSITE POSTOP 4X6 (GAUZE/BANDAGES/DRESSINGS) IMPLANT
DRSG OPSITE POSTOP 4X8 (GAUZE/BANDAGES/DRESSINGS) IMPLANT
DRSG TEGADERM 4X4.75 (GAUZE/BANDAGES/DRESSINGS) ×3 IMPLANT
DRSG TELFA 3X8 NADH (GAUZE/BANDAGES/DRESSINGS) ×3 IMPLANT
ELECT BLADE TIP CTD 4 INCH (ELECTRODE) ×2 IMPLANT
ELECT PENCIL ROCKER SW 15FT (MISCELLANEOUS) ×5 IMPLANT
ELECT REM PT RETURN 15FT ADLT (MISCELLANEOUS) ×3 IMPLANT
EVACUATOR SILICONE 100CC (DRAIN) ×1 IMPLANT
GAUZE SPONGE 4X4 12PLY STRL (GAUZE/BANDAGES/DRESSINGS) ×2 IMPLANT
GLOVE BIOGEL PI IND STRL 7.5 (GLOVE) ×32 IMPLANT
GLOVE BIOGEL PI INDICATOR 7.5 (GLOVE) ×16
GOWN STRL REUS W/TWL XL LVL3 (GOWN DISPOSABLE) ×16 IMPLANT
IRRIG SUCT STRYKERFLOW 2 WTIP (MISCELLANEOUS)
IRRIGATION SUCT STRKRFLW 2 WTP (MISCELLANEOUS) ×2 IMPLANT
LEGGING LITHOTOMY PAIR STRL (DRAPES) ×1 IMPLANT
LIGASURE IMPACT 36 18CM CVD LR (INSTRUMENTS) ×1 IMPLANT
PACK COLON (CUSTOM PROCEDURE TRAY) ×3 IMPLANT
PAD DRESSING TELFA 3X8 NADH (GAUZE/BANDAGES/DRESSINGS) IMPLANT
PAD POSITIONING PINK XL (MISCELLANEOUS) ×3 IMPLANT
POSITIONER SURGICAL ARM (MISCELLANEOUS) IMPLANT
RELOAD PROXIMATE 75MM BLUE (ENDOMECHANICALS) ×6 IMPLANT
RELOAD STAPLE 75 3.8 BLU REG (ENDOMECHANICALS) IMPLANT
RTRCTR WOUND ALEXIS 18CM MED (MISCELLANEOUS)
SCISSORS LAP 5X35 DISP (ENDOMECHANICALS) ×3 IMPLANT
SEALER TISSUE X1 CVD JAW (INSTRUMENTS) IMPLANT
SET IRRIG TUBING LAPAROSCOPIC (IRRIGATION / IRRIGATOR) ×1 IMPLANT
SHEARS HARMONIC ACE PLUS 36CM (ENDOMECHANICALS) ×3 IMPLANT
SLEEVE ADV FIXATION 5X100MM (TROCAR) ×2 IMPLANT
SLEEVE XCEL OPT CAN 5 100 (ENDOMECHANICALS) IMPLANT
SPONGE DRAIN TRACH 4X4 STRL 2S (GAUZE/BANDAGES/DRESSINGS) ×3 IMPLANT
SPONGE LAP 18X18 X RAY DECT (DISPOSABLE) ×3 IMPLANT
STAPLER PROXIMATE 75MM BLUE (STAPLE) ×2 IMPLANT
STAPLER VISISTAT 35W (STAPLE) ×3 IMPLANT
SUCTION POOLE TIP (SUCTIONS) ×3 IMPLANT
SUT ETHILON 3 0 PS 1 (SUTURE) ×1 IMPLANT
SUT NOVA NAB DX-16 0-1 5-0 T12 (SUTURE) ×3 IMPLANT
SUT PDS AB 1 CTX 36 (SUTURE) IMPLANT
SUT PDS AB 1 TP1 96 (SUTURE) ×4 IMPLANT
SUT PROLENE 2 0 KS (SUTURE) IMPLANT
SUT SILK 2 0 (SUTURE) ×3
SUT SILK 2 0 SH CR/8 (SUTURE) ×7 IMPLANT
SUT SILK 2-0 18XBRD TIE 12 (SUTURE) ×2 IMPLANT
SUT SILK 3 0 (SUTURE) ×3
SUT SILK 3 0 SH CR/8 (SUTURE) ×4 IMPLANT
SUT SILK 3-0 18XBRD TIE 12 (SUTURE) ×2 IMPLANT
SUT VIC AB 0 UR5 27 (SUTURE) ×1 IMPLANT
SUT VIC AB 2-0 SH 18 (SUTURE) IMPLANT
SUT VICRYL 2 0 18  UND BR (SUTURE)
SUT VICRYL 2 0 18 UND BR (SUTURE) IMPLANT
SYS LAPSCP GELPORT 120MM (MISCELLANEOUS)
SYSTEM LAPSCP GELPORT 120MM (MISCELLANEOUS) IMPLANT
TAPE CLOTH 4X10 WHT NS (GAUZE/BANDAGES/DRESSINGS) ×2 IMPLANT
TOWEL OR 17X26 10 PK STRL BLUE (TOWEL DISPOSABLE) IMPLANT
TOWEL OR NON WOVEN STRL DISP B (DISPOSABLE) ×3 IMPLANT
TRAY FOLEY BAG SILVER LF 16FR (CATHETERS) ×1 IMPLANT
TROCAR ADV FIXATION 5X100MM (TROCAR) IMPLANT
TROCAR BLADELESS OPT 5 100 (ENDOMECHANICALS) IMPLANT
TROCAR XCEL BLUNT TIP 100MML (ENDOMECHANICALS) IMPLANT
TROCAR XCEL NON-BLD 11X100MML (ENDOMECHANICALS) IMPLANT
TUBING INSUF HEATED (TUBING) ×3 IMPLANT

## 2017-03-09 NOTE — Anesthesia Procedure Notes (Signed)
Procedure Name: Intubation Date/Time: 03/09/2017 7:27 AM Performed by: Carleene Cooper A Pre-anesthesia Checklist: Patient identified, Emergency Drugs available, Suction available, Patient being monitored and Timeout performed Patient Re-evaluated:Patient Re-evaluated prior to inductionOxygen Delivery Method: Circle system utilized Preoxygenation: Pre-oxygenation with 100% oxygen Intubation Type: IV induction Ventilation: Mask ventilation without difficulty Laryngoscope Size: Mac and 4 Grade View: Grade I Tube type: Oral Tube size: 7.5 mm Number of attempts: 1 Airway Equipment and Method: Stylet Placement Confirmation: ETT inserted through vocal cords under direct vision,  positive ETCO2 and breath sounds checked- equal and bilateral Secured at: 20 cm Tube secured with: Tape Dental Injury: Teeth and Oropharynx as per pre-operative assessment

## 2017-03-09 NOTE — Anesthesia Preprocedure Evaluation (Signed)
Anesthesia Evaluation  Patient identified by MRN, date of birth, ID band Patient awake    Reviewed: Allergy & Precautions, NPO status , Patient's Chart, lab work & pertinent test results  History of Anesthesia Complications (+) PONV  Airway Mallampati: II  TM Distance: >3 FB Neck ROM: Full    Dental no notable dental hx.    Pulmonary neg pulmonary ROS,    Pulmonary exam normal breath sounds clear to auscultation       Cardiovascular hypertension, negative cardio ROS Normal cardiovascular exam Rhythm:Regular Rate:Normal     Neuro/Psych negative neurological ROS  negative psych ROS   GI/Hepatic Neg liver ROS, GERD  ,  Endo/Other  Hypothyroidism   Renal/GU negative Renal ROS  negative genitourinary   Musculoskeletal negative musculoskeletal ROS (+)   Abdominal   Peds negative pediatric ROS (+)  Hematology negative hematology ROS (+)   Anesthesia Other Findings   Reproductive/Obstetrics negative OB ROS                             Anesthesia Physical Anesthesia Plan  ASA: II  Anesthesia Plan: General   Post-op Pain Management:    Induction: Intravenous  PONV Risk Score and Plan: 1 and Ondansetron, Dexamethasone and Treatment may vary due to age or medical condition  Airway Management Planned: Oral ETT  Additional Equipment:   Intra-op Plan:   Post-operative Plan: Extubation in OR  Informed Consent: I have reviewed the patients History and Physical, chart, labs and discussed the procedure including the risks, benefits and alternatives for the proposed anesthesia with the patient or authorized representative who has indicated his/her understanding and acceptance.   Dental advisory given  Plan Discussed with: CRNA and Surgeon  Anesthesia Plan Comments:         Anesthesia Quick Evaluation

## 2017-03-09 NOTE — Transfer of Care (Signed)
Immediate Anesthesia Transfer of Care Note  Patient: [name]  Procedure(s) Performed: Procedure(s): LAPAROSCOPY, TAKEDOWN SPLENIC FLEXURE, LEFT COLECTOMY, PROCTOSCOPY (N/A) PROCTOSCOPY  Patient Location: PACU  Anesthesia Type:General  Level of Consciousness: sedated, patient cooperative and responds to stimulation  Airway & Oxygen Therapy: Patient Spontanous Breathing and Patient connected to face mask oxygen  Post-op Assessment: Report given to RN and Post -op Vital signs reviewed and stable  Post vital signs: Reviewed and stable  Last Vitals:  Vitals:   03/09/17 0536 03/09/17 1052  BP: 116/75   Pulse: 94 84  Resp: 16   Temp: 36.7 C     Last Pain:  Vitals:   03/09/17 0612  TempSrc:   PainSc: 0-No pain      Patients Stated Pain Goal: 3 (17/35/67 0141)  Complications: No apparent anesthesia complications

## 2017-03-09 NOTE — Op Note (Addendum)
Patient Name:           [name]   Date of Surgery:        03/09/2017  Pre op Diagnosis:      Left colon mass, suspect chronic diverticulitis with partial obstruction  Post op Diagnosis:    Same  Procedure:                 Laparoscopic takedown of splenic flexure, open left colectomy with primary anastomosis, rigid proctoscopy  Surgeon:                     Edsel Petrin. Dalbert Batman, M.D., FACS  Assistant:                      [name], M.D., FACS   Indication for Assistant: Complex exposure and technique.  Surgeon assistant indicated to reduce the incidence of intraoperative and postoperative complications  Operative Indications:   This is a 82 year old female who is brought to the hospital electively for left colectomy. She is referred by Dr. Garfield Cornea in [place]. Her PCP is Dr. [name]. She is also followed at the [facility] for anemia and remote history of left breast cancer..       It sounds like she has her several episodes of diverticulitis over the years. Treated with antibiotics. Over the past year she's been having left-sided abdominal pain after she eats and has to run to the bathroom urgently. Stools are  soft. C. difficile has been negative. She has an anemia and has had iron infusions. She has lost 16 pounds in the last 6 weeks. Probably has lost 40 pounds in the last year and she has lost 100 pounds since December 29, 2012 when her husband died. She denies vomiting. CT scan performed on February 07, 2017 shows persistent long segment irregular thickening of the descending colon and proximal sigmoid colon, worrisome some for colonic malignancy. Looks like a mass to me but hopefully this is just burned out diverticulitis. Also a little bit of wall thickening of the splenic flexure and distal transverse colon thought to represent colitis. No evidence for bowel obstruction.  Colonoscopy was performed on November 17, 2016. . Colonoscopy got all the way to the  cecum. Four pedunculated polyps in ascending colon which showed tubular adenoma. Pan colonic diverticula. Abnormal 20 cm segment of left colon lumen narrowed. Edematous. Did not appear neoplastic. Biopsies showed moderately active chronic colitis but no dysplasia     This is high risk due to her anemia, protein calorie malnutrition, altered immune system, memory disorder, partial obstruction. The patient and her family are aware of this She will be scheduled for laparoscopic-assisted left colectomy. Suspect this will be converted to open due to inflammation and rigidity of the colon.  She underwent mechanical and antibiotic bowel prep.  She had a lot of nausea and vomited once but basically seemed to clean out well.  Preop hemoglobin 11.2.  Preop albumin 3.0.      I discussed the indications, details, techniques, and numerous risk of the surgery with the patient and her daughter.  She agrees with this plan.  Operative Findings:       The left colon had a firm rubbery mass extending probably 10 inches in length from the distal descending into the proximal sigmoid.  There were some diverticula present.  This felt more like inflammatory disease than cancer.  Once this was resected proximal and distal ends of  colon looked healthy, had excellent blood supply and could be brought together without tension.  We chose to perform a primary anastomosis with a handsewn technique.  The anastomosis is probably 15 cm above the peritoneal reflection.  There was no sign of any metastatic cancer.  The pathologist did a gross exam of felt this was more likely to be diverticulitis.  Rigid proctoscopy was performed at the end of the case and there was no air bubbles or air leak.  Procedure in Detail:          Following the induction of general endotracheal anesthesia a Foley catheter was placed.  Oral gastric tube was placed.  The patient was placed in rigid padded stirrups.  The abdomen was prepped and draped in a  sterile fashion.  Surgical timeout was performed.  Intravenous antibiotics were given.  0.5% Marcaine with epinephrine was used as local infiltration anesthetic.     An 11 mm Hassan trocar was placed at the infraumbilical site as an open technique.  Video camera was inserted after pneumoperitoneum was clear.  There is no evidence of intestinal injury or bleeding.  5 mm trochars were placed in the midline above and below the umbilicus.  The left colon was very rigid and could not be moved around very well with a laparoscopic estimates.  The splenic flexure was soft.  Using cautery scissors, Harmonic Scalpel, and blunt dissection I separated the omentum from the left transverse colon and then pulled the splenic flexure down in small steps, dividing the lateral peritoneal attachments of the proximal descending colon.  Near the end of this dissection the patients end- tidal CO2 became high and she could not seem to tolerate insufflation and so we abandoned any further laparoscopic surgery.      A midline incision was made.  Subcutaneous retractor was placed.  I further mobilized the descending colon.  This is very tedious dissection Very close to the colon and took very little mesentery down.  I divided the distal sigmoid with a GIA stapling device.  I divided the mid descending colon between [facility] clamps.  The rest of the  colon mesentery was divided with LigaSure device and the specimen was sent to the lab.  The distal margin was marked with a silk suture.  Dr. Orene Desanctis in pathology did a gross exam of felt this was more likely to be chronic diverticulitis.     The 2 limbs of colon were quite pink and healthy and bled well.  The mesentery appeared to be preserved.  There was no bleeding.  We irrigated the abdomen little bit.  We felt that we could do an anastomosis  with no tension whatsoever.  We discussed her comorbidities and malnutrition.  We felt that since the anastomosis was relatively up high, techniques looked  good with that we would go ahead with a primary anastomosis.  The anastomosis created in a single layer with interrupted sutures of 2-0 silk.  Corner sutures were placed.  The back wall was performed first with interrupted sutures tying the knots internally.  I turned the corners in very carefully and did the anterior wall with knots placed externally.  The anastomosis was greater than 2 finger widths.  Rigid proctoscopy was performed under water and with proximal occlusion and there were no air bubbles.  We felt this was satisfactory.  We irrigated the abdomen extensively.      At this point we changed over our gowns and gloves and instruments and drapes.  We further irrigated the abdomen.  There was no bleeding.  A 19 [place] Blake drain was placed down into the pelvis and brought out through one of the 5 mm trocar sites.  This was sutured to the skin and connected to suction bulb.  Midline fascia was closed with a running suture of #1, double-stranded PDS and several interrupted #1 Novafil's.  The skin was closed with skin staples and Telfa wicks placed in between.  Honeycomb bandage placed on top.  The patient tolerated the procedure well was taken to PACU in stable condition.  EBL 150 mL or less.  Counts correct.  Complications none.           Edsel Petrin. Dalbert Batman, M.D., FACS General and Minimally Invasive Surgery Breast and Colorectal Surgery  03/09/2017 10:46 AM

## 2017-03-09 NOTE — Anesthesia Postprocedure Evaluation (Signed)
Anesthesia Post Note  Patient: [name]  Procedure(s) Performed: Procedure(s) (LRB): LAPAROSCOPY, TAKEDOWN SPLENIC FLEXURE, LEFT COLECTOMY, PROCTOSCOPY (N/A) PROCTOSCOPY     Patient location during evaluation: PACU Anesthesia Type: General Level of consciousness: awake and alert Pain management: pain level controlled Vital Signs Assessment: post-procedure vital signs reviewed and stable Respiratory status: spontaneous breathing, nonlabored ventilation, respiratory function stable and patient connected to nasal cannula oxygen Cardiovascular status: blood pressure returned to baseline and stable Postop Assessment: no signs of nausea or vomiting Anesthetic complications: no    Last Vitals:  Vitals:   03/09/17 1200 03/09/17 1400  BP: (!) 121/98 116/62  Pulse:  89  Resp: 14 14  Temp:      Last Pain:  Vitals:   03/09/17 1310  TempSrc:   PainSc: Asleep                 Chen Saadeh S

## 2017-03-09 NOTE — Interval H&P Note (Signed)
History and Physical Interval Note:  03/09/2017 6:49 AM  [name]  has presented today for surgery, with the diagnosis of left colon mass  The various methods of treatment have been discussed with the patient and family. After consideration of risks, benefits and other options for treatment, the patient has consented to  Procedure(s): LAPAROSCOPIC ASSISTED LEFT COLECTOMY, POSSIBLE OPEN COLECTOMY (Left) as a surgical intervention .  The patient's history has been reviewed, patient examined, no change in status, stable for surgery.  I have reviewed the patient's chart and labs.  Questions were answered to the patient's satisfaction.     Adin Hector

## 2017-03-10 LAB — BASIC METABOLIC PANEL
Anion gap: 11 (ref 5–15)
BUN: 14 mg/dL (ref 6–20)
CALCIUM: 7.1 mg/dL — AB (ref 8.9–10.3)
CHLORIDE: 105 mmol/L (ref 101–111)
CO2: 20 mmol/L — AB (ref 22–32)
CREATININE: 0.77 mg/dL (ref 0.44–1.00)
GFR calc non Af Amer: >60 mL/min (ref >60)
GLUCOSE: 108 mg/dL — AB (ref 65–99)
Potassium: 4.2 mmol/L (ref 3.5–5.1)
Sodium: 136 mmol/L (ref 135–145)

## 2017-03-10 LAB — CBC
HEMATOCRIT: 26.1 % — AB (ref 36.0–46.0)
Hemoglobin: 8.6 g/dL — ABNORMAL LOW (ref 12.0–15.0)
MCH: 30 pg (ref 26.0–34.0)
MCHC: 33 g/dL (ref 30.0–36.0)
MCV: 90.9 fL (ref 78.0–100.0)
PLATELETS: 345 10*3/uL (ref 150–400)
RBC: 2.87 MIL/uL — ABNORMAL LOW (ref 3.87–5.11)
RDW: 14.7 % (ref 11.5–15.5)
WBC: 16.7 10*3/uL — AB (ref 4.0–10.5)

## 2017-03-10 NOTE — Evaluation (Signed)
Physical Therapy Evaluation Patient Details Name: [name] MRN: [id number] DOB: 07-30-1935 Today's Date: 03/10/2017   History of Present Illness  History and PhysicalThe patient is a 82 year old female who presents with a colonic mass. Post Laparoscopic takedown of splenic flexure, open left colectomy with primary anastomosis, rigid proctoscopy on 03/09/17  Clinical Impression  The patient  Ambulated x 240'. Plans Dc home with caregivers. Should progress well.  Pt admitted with above diagnosis. Pt currently with functional limitations due to the deficits listed below (see PT Problem List). Pt will benefit from skilled PT to increase their independence and safety with mobility to allow discharge to the venue listed below.       Follow Up Recommendations Home health PT;Supervision/Assistance - 24 hour    Equipment Recommendations  Rolling walker with 5" wheels    Recommendations for Other Services       Precautions / Restrictions Precautions Precaution Comments: drain left,  abdominal wound      Mobility  Bed Mobility               General bed mobility comments: in recliner  Transfers Overall transfer level: Needs assistance Equipment used: Rolling walker (2 wheeled) Transfers: Sit to/from Stand Sit to Stand: Min guard         General transfer comment: cues for safety  Ambulation/Gait Ambulation/Gait assistance: Min assist;+2 safety/equipment Ambulation Distance (Feet): 240 Feet Assistive device: Rolling walker (2 wheeled) Gait Pattern/deviations: Step-through pattern     General Gait Details: the patient tolerated ambulation today. very motivated  Financial trader Rankin (Stroke Patients Only)       Balance Overall balance assessment: Needs assistance Sitting-balance support: No upper extremity supported;Feet supported Sitting balance-Leahy Scale: Good     Standing balance support: During functional  activity;No upper extremity supported Standing balance-Leahy Scale: Fair Standing balance comment: 1 balance loss when took hands from RW while talking with hands                             Pertinent Vitals/Pain Pain Assessment: 0-10 Pain Score: 6  Pain Location: right shoulder Pain Descriptors / Indicators: Discomfort;Grimacing;Guarding Pain Intervention(s): Monitored during session;Premedicated before session;Patient requesting pain meds-RN notified;Heat applied    Home Living Family/patient expects to be discharged to:: Private residence Living Arrangements: Children Available Help at Discharge: Family Type of Home: House Home Access: Stairs to enter Entrance Stairs-Rails: Right Entrance Stairs-Number of Steps: 4 Home Layout: Multi-level;Able to live on main level with bedroom/bathroom Home Equipment: None Additional Comments: daughter and son inlaw live w/ pt, daughter taking 1 month leave    Prior Function                 Hand Dominance        Extremity/Trunk Assessment   Upper Extremity Assessment Upper Extremity Assessment: Overall WFL for tasks assessed;RUE deficits/detail RUE Deficits / Details: decreased elevation shoulder with pain    Lower Extremity Assessment Lower Extremity Assessment: Overall WFL for tasks assessed    Cervical / Trunk Assessment Cervical / Trunk Assessment: Normal  Communication      Cognition   Behavior During Therapy: WFL for tasks assessed/performed Overall Cognitive Status: Within Functional Limits for tasks assessed  General Comments      Exercises     Assessment/Plan    PT Assessment Patient needs continued PT services  PT Problem List Decreased activity tolerance;Decreased balance;Decreased mobility;Decreased knowledge of precautions;Decreased safety awareness;Decreased knowledge of use of DME;Pain       PT Treatment Interventions DME  instruction;Gait training;Functional mobility training;Therapeutic activities;Therapeutic exercise;Stair training;Patient/family education;Cognitive remediation    PT Goals (Current goals can be found in the Care Plan section)  Acute Rehab PT Goals Patient Stated Goal: to walk, go home PT Goal Formulation: With patient/family Time For Goal Achievement: 03/24/17 Potential to Achieve Goals: Good    Frequency Min 3X/week   Barriers to discharge        Co-evaluation               AM-PAC PT "6 Clicks" Daily Activity  Outcome Measure Difficulty turning over in bed (including adjusting bedclothes, sheets and blankets)?: Total Difficulty moving from lying on back to sitting on the side of the bed? : Total Difficulty sitting down on and standing up from a chair with arms (e.g., wheelchair, bedside commode, etc,.)?: A Lot Help needed moving to and from a bed to chair (including a wheelchair)?: A Lot Help needed walking in hospital room?: A Lot Help needed climbing 3-5 steps with a railing? : A Lot 6 Click Score: 10    End of Session   Activity Tolerance: Patient tolerated treatment well Patient left: in chair;with call bell/phone within reach;with family/visitor present Nurse Communication: Mobility status PT Visit Diagnosis: Difficulty in walking, not elsewhere classified (R26.2);Pain Pain - Right/Left: Right Pain - part of body: Shoulder    Time: 0211-1552 PT Time Calculation (min) (ACUTE ONLY): 31 min   Charges:   PT Evaluation $PT Eval Moderate Complexity: 1 Procedure PT Treatments $Gait Training: 8-22 mins   PT G CodesTresa Endo PT [phone]   [name] 03/10/2017, 12:00 PM

## 2017-03-10 NOTE — Progress Notes (Signed)
1 Day Post-Op   Subjective/Chief Complaint: Comfortable this morning Denies nausea   Objective: Vital signs in last 24 hours: Temp:  [97.4 F (36.3 C)-98.2 F (36.8 C)] 97.8 F (36.6 C) (07/07 0400) Pulse Rate:  [59-89] 67 (07/07 0700) Resp:  [6-34] 10 (07/07 0700) BP: (99-138)/(52-98) 119/52 (07/07 0700) SpO2:  [95 %-100 %] 97 % (07/07 0700)    Intake/Output from previous day: 07/06 0701 - 07/07 0700 In: 3625 [I.V.:3625] Out: 510 [Urine:200; Drains:210; Blood:100] Intake/Output this shift: No intake/output data recorded.  Exam: Awake and alert Lungs clear Abdomen soft, non -distended, drain serosang  Lab Results:   Recent Labs  03/09/17 1114 03/10/17 0703  WBC 21.7* 16.7*  HGB 10.3*  10.4* 8.6*  HCT 31.9*  32.3* 26.1*  PLT 368 345   BMET  Recent Labs  03/09/17 1114 03/10/17 0402  NA  --  136  K 3.6 4.2  CL  --  105  CO2  --  20*  GLUCOSE  --  108*  BUN  --  14  CREATININE 1.16* 0.77  CALCIUM  --  7.1*   PT/INR No results for input(s): LABPROT, INR in the last 72 hours. ABG No results for input(s): PHART, HCO3 in the last 72 hours.  Invalid input(s): PCO2, PO2  Studies/Results: No results found.  Anti-infectives: Anti-infectives    Start     Dose/Rate Route Frequency Ordered Stop   03/09/17 2000  cefoTEtan (CEFOTAN) 2 g in dextrose 5 % 50 mL IVPB     2 g 100 mL/hr over 30 Minutes Intravenous Every 12 hours 03/09/17 1209 03/09/17 2251   03/09/17 0649  cefoTEtan in Dextrose 5% (CEFOTAN) 2-2.08 GM-% IVPB    Comments:  Delena Bali   : cabinet override      03/09/17 0649 03/09/17 0700   03/09/17 0556  cefoTEtan (CEFOTAN) 2 g in dextrose 5 % 50 mL IVPB     2 g 100 mL/hr over 30 Minutes Intravenous On call to O.R. 03/09/17 9767 03/09/17 0758      Assessment/Plan: s/p Procedure(s): LAPAROSCOPY, TAKEDOWN SPLENIC FLEXURE, LEFT COLECTOMY, PROCTOSCOPY (N/A) PROCTOSCOPY   Start clear liquids Repeat hgb in the morning,  hgb down Leave  foley.  UOP low.  Will monitor closely   LOS: 1 day    Skylen Danielsen A 03/10/2017

## 2017-03-11 LAB — CBC
HEMATOCRIT: 25.3 % — AB (ref 36.0–46.0)
HEMOGLOBIN: 8.2 g/dL — AB (ref 12.0–15.0)
MCH: 30.1 pg (ref 26.0–34.0)
MCHC: 32.4 g/dL (ref 30.0–36.0)
MCV: 93 fL (ref 78.0–100.0)
Platelets: 290 10*3/uL (ref 150–400)
RBC: 2.72 MIL/uL — AB (ref 3.87–5.11)
RDW: 14.9 % (ref 11.5–15.5)
WBC: 10.9 10*3/uL — AB (ref 4.0–10.5)

## 2017-03-11 NOTE — Progress Notes (Signed)
Patient had a loose stool, medium in size and brown and red in color.

## 2017-03-11 NOTE — Evaluation (Signed)
Occupational Therapy Evaluation Patient Details Name: [name] MRN: [id number] DOB: [birth year] Today's Date: 03/11/2017    History of Present Illness he patient is a 82 year old female who presents with a colonic mass. Post Laparoscopic takedown of splenic flexure, open left colectomy with primary anastomosis, rigid proctoscopy on 03/09/17   Clinical Impression   Pt admitted with colonic mass. Pt currently with functional limitations due to the deficits listed below (see OT Problem List).  Pt will benefit from skilled OT to increase their safety and independence with ADL and functional mobility for ADL to facilitate discharge to venue listed below.      Follow Up Recommendations  No OT follow up    Equipment Recommendations  3 in 1 bedside commode    Recommendations for Other Services       Precautions / Restrictions        Mobility Bed Mobility               General bed mobility comments: in recliner  Transfers Overall transfer level: Needs assistance Equipment used: Rolling walker (2 wheeled) Transfers: Sit to/from Stand Sit to Stand: Min assist         General transfer comment: cues for safety    Balance                                           ADL either performed or assessed with clinical judgement   ADL Overall ADL's : Needs assistance/impaired Eating/Feeding: Set up;Sitting   Grooming: Set up;Standing   Upper Body Bathing: Set up;Sitting   Lower Body Bathing: Moderate assistance;Sit to/from stand;Adhering to hip precautions;Cueing for sequencing   Upper Body Dressing : Set up;Sitting   Lower Body Dressing: Moderate assistance;Sit to/from stand;Cueing for safety;Cueing for sequencing   Toilet Transfer: Minimal assistance;Ambulation;RW;Cueing for sequencing;Comfort height toilet   Toileting- Clothing Manipulation and Hygiene: Minimal assistance;Sit to/from stand;Cueing for safety;Cueing for sequencing         General  ADL Comments: family will be Aing pt at home     Vision Patient Visual Report: No change from baseline              Pertinent Vitals/Pain Pain Assessment: No/denies pain Pain Intervention(s): Limited activity within patient's tolerance;Premedicated before session     Hand Dominance     Extremity/Trunk Assessment Upper Extremity Assessment Upper Extremity Assessment: Overall WFL for tasks assessed RUE Deficits / Details: decreased elevation shoulder            Communication Communication Communication: No difficulties   Cognition Arousal/Alertness: Awake/alert Behavior During Therapy: WFL for tasks assessed/performed Overall Cognitive Status: Within Functional Limits for tasks assessed                                                Home Living Family/patient expects to be discharged to:: Private residence Living Arrangements: Children Available Help at Discharge: Family Type of Home: House Home Access: Stairs to enter Technical brewer of Steps: 4 Entrance Stairs-Rails: Right Home Layout: Multi-level;Able to live on main level with bedroom/bathroom     Bathroom Shower/Tub: Walk-in shower         Home Equipment: None   Additional Comments: daughter and son inlaw live w/ pt, daughter taking 1  month leave      Prior Functioning/Environment Level of Independence: Independent                 OT Problem List: Decreased strength;Decreased knowledge of use of DME or AE;Decreased activity tolerance      OT Treatment/Interventions: Self-care/ADL training;Patient/family education;DME and/or AE instruction    OT Goals(Current goals can be found in the care plan section) Acute Rehab OT Goals Patient Stated Goal: to walk, go home OT Goal Formulation: With patient Time For Goal Achievement: 03/25/17 Potential to Achieve Goals: Good  OT Frequency: Min 2X/week   Barriers to D/C:               AM-PAC PT "6 Clicks" Daily Activity      Outcome Measure Help from another person eating meals?: None Help from another person taking care of personal grooming?: A Little Help from another person toileting, which includes using toliet, bedpan, or urinal?: A Little Help from another person bathing (including washing, rinsing, drying)?: A Little Help from another person to put on and taking off regular upper body clothing?: A Little Help from another person to put on and taking off regular lower body clothing?: A Little 6 Click Score: 19   End of Session Equipment Utilized During Treatment: Rolling walker Nurse Communication: Mobility status  Activity Tolerance: Patient tolerated treatment well Patient left: in chair;with call bell/phone within reach  OT Visit Diagnosis: Unsteadiness on feet (R26.81)                Time: 6269-4854 OT Time Calculation (min): 26 min Charges:  OT General Charges $OT Visit: 1 Procedure OT Evaluation $OT Eval Moderate Complexity: 1 Procedure OT Treatments $Self Care/Home Management : 8-22 mins G-Codes:     [name], OT [phone]  Payton Mccallum D 03/11/2017, 1:03 PM

## 2017-03-11 NOTE — Progress Notes (Signed)
2 Days Post-Op   Subjective/Chief Complaint: Tolerated clears Denies nausea No flatus yet   Objective: Vital signs in last 24 hours: Temp:  [98.3 F (36.8 C)-99.2 F (37.3 C)] 98.7 F (37.1 C) (07/08 0504) Pulse Rate:  [68-72] 71 (07/07 2100) Resp:  [12-23] 12 (07/08 0700) BP: (116-155)/(49-94) 124/65 (07/08 0700) SpO2:  [95 %-96 %] 95 % (07/07 2100) Last BM Date: 03/08/17  Intake/Output from previous day: 07/07 0701 - 07/08 0700 In: 1500 [I.V.:1500] Out: 2190 [Urine:2165; Drains:25] Intake/Output this shift: No intake/output data recorded.  Exam: Awake and alert Abdomen soft, drain clear +BS  Lab Results:   Recent Labs  03/10/17 0703 03/11/17 0332  WBC 16.7* 10.9*  HGB 8.6* 8.2*  HCT 26.1* 25.3*  PLT 345 290   BMET  Recent Labs  03/09/17 1114 03/10/17 0402  NA  --  136  K 3.6 4.2  CL  --  105  CO2  --  20*  GLUCOSE  --  108*  BUN  --  14  CREATININE 1.16* 0.77  CALCIUM  --  7.1*   PT/INR No results for input(s): LABPROT, INR in the last 72 hours. ABG No results for input(s): PHART, HCO3 in the last 72 hours.  Invalid input(s): PCO2, PO2  Studies/Results: No results found.  Anti-infectives: Anti-infectives    Start     Dose/Rate Route Frequency Ordered Stop   03/09/17 2000  cefoTEtan (CEFOTAN) 2 g in dextrose 5 % 50 mL IVPB     2 g 100 mL/hr over 30 Minutes Intravenous Every 12 hours 03/09/17 1209 03/09/17 2251   03/09/17 0649  cefoTEtan in Dextrose 5% (CEFOTAN) 2-2.08 GM-% IVPB    Comments:  Delena Bali   : cabinet override      03/09/17 0649 03/09/17 0700   03/09/17 0556  cefoTEtan (CEFOTAN) 2 g in dextrose 5 % 50 mL IVPB     2 g 100 mL/hr over 30 Minutes Intravenous On call to O.R. 03/09/17 5643 03/09/17 0758      Assessment/Plan: s/p Procedure(s): LAPAROSCOPY, TAKEDOWN SPLENIC FLEXURE, LEFT COLECTOMY, PROCTOSCOPY (N/A) PROCTOSCOPY  Transfer to floor Advance to full liquids D/c foley  LOS: 2 days    Keirstyn Aydt  A 03/11/2017

## 2017-03-12 NOTE — Progress Notes (Signed)
Physical Therapy Treatment Patient Details Name: [name] MRN: [id number] DOB: [birth year] Today's Date: 03/12/2017    History of Present Illness lThe patient is a 82 year old female who presents with a colonic mass. Post Laparoscopic takedown of splenic flexure, open left colectomy with primary anastomosis, rigid proctoscopy on 03/09/17    PT Comments    The patuient is progressing well. Reports to DC tomorrow.   Follow Up Recommendations  Home health PT;Supervision/Assistance - 24 hour     Equipment Recommendations  None recommended by PT    Recommendations for Other Services       Precautions / Restrictions Precautions Precautions: Fall    Mobility  Bed Mobility               General bed mobility comments: in recliner  Transfers   Equipment used: None   Sit to Stand: Supervision         General transfer comment: cues for safety  Ambulation/Gait Ambulation/Gait assistance: Min guard Ambulation Distance (Feet): 300 Feet Assistive device: 1 person hand held assist       General Gait Details: the patient tolerated ambulation today. very motivated   Financial trader Rankin (Stroke Patients Only)       Balance           Standing balance support: No upper extremity supported Standing balance-Leahy Scale: Fair                              Cognition Arousal/Alertness: Awake/alert                                            Exercises      General Comments        Pertinent Vitals/Pain Pain Assessment: No/denies pain    Home Living                      Prior Function            PT Goals (current goals can now be found in the care plan section) Progress towards PT goals: Progressing toward goals    Frequency    Min 3X/week      PT Plan Current plan remains appropriate    Co-evaluation              AM-PAC PT "6 Clicks" Daily  Activity  Outcome Measure  Difficulty turning over in bed (including adjusting bedclothes, sheets and blankets)?: None Difficulty moving from lying on back to sitting on the side of the bed? : None Difficulty sitting down on and standing up from a chair with arms (e.g., wheelchair, bedside commode, etc,.)?: None Help needed moving to and from a bed to chair (including a wheelchair)?: A Little Help needed walking in hospital room?: A Little Help needed climbing 3-5 steps with a railing? : A Lot 6 Click Score: 20    End of Session   Activity Tolerance: Patient tolerated treatment well Patient left: in chair;with call bell/phone within reach;with family/visitor present Nurse Communication: Mobility status       Time: 1436-1450 PT Time Calculation (min) (ACUTE ONLY): 14 min  Charges:  $Gait Training: 8-22 mins  G CodesTresa [name] PT [phone]    [name] 03/12/2017, 5:47 PM

## 2017-03-12 NOTE — Progress Notes (Signed)
  General Surgery [facility]  Assessment & Plan: POD#3 - status post left colectomy for suspected diverticular disease  Tolerating full liquid diet - advance to regular today  Dressings changed and wicks removed  Home 1-2 days        Earnstine Regal, MD, [facility]       Office: [phone]    Chief Complaint: Wants real food to eat - "steak and lobster"   Subjective: Mild pain, having soft BM's  Objective: Vital signs in last 24 hours: Temp:  [98.4 F (36.9 C)-98.8 F (37.1 C)] 98.4 F (36.9 C) (07/09 0453) Pulse Rate:  [61-67] 61 (07/09 0453) Resp:  [14-16] 16 (07/09 0453) BP: (149-157)/(73-74) 157/74 (07/09 0453) SpO2:  [96 %] 96 % (07/09 0453) Last BM Date: 03/11/17  Intake/Output from previous day: 07/08 0701 - 07/09 0700 In: 2050 [P.O.:300; I.V.:1750] Out: 900 [Urine:850; Drains:50] Intake/Output this shift: Total I/O In: 360 [P.O.:360] Out: -   Physical Exam: HEENT - sclerae clear, mucous membranes moist Neck - soft Chest - clear bilaterally Cor - RRR Abdomen - soft without distension; dressings removed and wicks out; serosanguinous in JP Ext - no edema, non-tender Neuro - alert & oriented, no focal deficits  Lab Results:   Recent Labs  03/10/17 0703 03/11/17 0332  WBC 16.7* 10.9*  HGB 8.6* 8.2*  HCT 26.1* 25.3*  PLT 345 290   BMET  Recent Labs  03/10/17 0402  NA 136  K 4.2  CL 105  CO2 20*  GLUCOSE 108*  BUN 14  CREATININE 0.77  CALCIUM 7.1*   PT/INR No results for input(s): LABPROT, INR in the last 72 hours. Comprehensive Metabolic Panel:    Component Value Date/Time   NA 136 03/10/2017 0402   NA 138 03/02/2017 1152   K 4.2 03/10/2017 0402   K 3.6 03/09/2017 1114   CL 105 03/10/2017 0402   CL 105 03/02/2017 1152   CO2 20 (L) 03/10/2017 0402   CO2 26 03/02/2017 1152   BUN 14 03/10/2017 0402   BUN 19 03/02/2017 1152   CREATININE 0.77 03/10/2017 0402   CREATININE 1.16  (H) 03/09/2017 1114   GLUCOSE 108 (H) 03/10/2017 0402   GLUCOSE 81 03/02/2017 1152   CALCIUM 7.1 (L) 03/10/2017 0402   CALCIUM 8.5 (L) 03/02/2017 1152   CALCIUM 8.7 10/22/2014 1003   CALCIUM 8.8 07/08/2014 1004   AST 31 03/02/2017 1152   AST 18 10/30/2016 1105   ALT 17 03/02/2017 1152   ALT 8 (L) 10/30/2016 1105   ALKPHOS 97 03/02/2017 1152   ALKPHOS 63 10/30/2016 1105   BILITOT 0.5 03/02/2017 1152   BILITOT 0.5 10/30/2016 1105   PROT 6.9 03/02/2017 1152   PROT 7.7 10/30/2016 1105   ALBUMIN 3.0 (L) 03/02/2017 1152   ALBUMIN 3.3 (L) 10/30/2016 1105    Studies/Results: No results found.    Baer Hinton M 03/12/2017  Patient ID: Darcus Pester, female   DOB: 1935-06-10, 82 y.o.   MRN: [id number]

## 2017-03-12 NOTE — Progress Notes (Signed)
Occupational Therapy Treatment Patient Details Name: [name] MRN: [id number] DOB: 14-Dec-1934 Today's Date: 03/12/2017    History of present illness lThe patient is a 82 year old female who presents with a colonic mass. Post Laparoscopic takedown of splenic flexure, open left colectomy with primary anastomosis, rigid proctoscopy on 03/09/17   OT comments  Pt doing well and will have A at home.Pt very motivated!  Follow Up Recommendations  No OT follow up    Equipment Recommendations  None recommended by OT    Recommendations for Other Services      Precautions / Restrictions Precautions Precautions: Fall Precaution Comments: drain left,  abdominal wound Restrictions Weight Bearing Restrictions: No       Mobility Bed Mobility               General bed mobility comments: in recliner  Transfers Overall transfer level: Needs assistance Equipment used: None Transfers: Sit to/from Stand Sit to Stand: Min guard         General transfer comment: cues for safety        ADL either performed or assessed with clinical judgement   ADL Overall ADL's : Needs assistance/impaired                         Toilet Transfer: Ambulation;RW;Cueing for sequencing;Comfort height toilet;Min guard   Toileting- Clothing Manipulation and Hygiene: Sit to/from stand;Cueing for safety;Cueing for sequencing;Min guard       Functional mobility during ADLs: Min guard;Cueing for safety       Vision Patient Visual Report: No change from baseline            Cognition Arousal/Alertness: Awake/alert Behavior During Therapy: WFL for tasks assessed/performed Overall Cognitive Status: Within Functional Limits for tasks assessed                                                     Pertinent Vitals/ Pain       Pain Assessment: No/denies pain Pain Location: right shoulder Pain Intervention(s): Limited activity within patient's tolerance          Frequency  Min 2X/week        Progress Toward Goals  OT Goals(current goals can now be found in the care plan section)  Progress towards OT goals: Progressing toward goals     Plan Discharge plan remains appropriate       AM-PAC PT "6 Clicks" Daily Activity     Outcome Measure   Help from another person eating meals?: None Help from another person taking care of personal grooming?: None Help from another person toileting, which includes using toliet, bedpan, or urinal?: A Little Help from another person bathing (including washing, rinsing, drying)?: A Little Help from another person to put on and taking off regular upper body clothing?: None Help from another person to put on and taking off regular lower body clothing?: A Little 6 Click Score: 21    End of Session    OT Visit Diagnosis: Unsteadiness on feet (R26.81)   Activity Tolerance Patient tolerated treatment well   Patient Left in chair;with call bell/phone within reach   Nurse Communication Mobility status        Time: 1050-1105 OT Time Calculation (min): 15 min  Charges: OT General Charges $OT Visit: 1 Procedure OT Treatments $  Self Care/Home Management : 8-22 mins  [place], [place]   Betsy Pries 03/12/2017, 11:26 AM

## 2017-03-12 NOTE — Progress Notes (Signed)
   03/12/17 0900  Clinical Encounter Type  Visited With Patient  Visit Type Initial;Psychological support;Spiritual support;Social support  Referral From Nurse  Consult/Referral To [place] was paged to provide emotional support to Pt. When Chaplain arrived Pt was alone, awake, and reported to be doing "just fine." However, Chaplain initiated a relationship of care and support by listening to Pt describe her experience facing several health events.   Pastoral Interventions: Chaplain a silent and supportive presence Chaplain facilitated conversation about reconciliation with Pt and Pt's faith community.

## 2017-03-13 MED ORDER — HYDROCODONE-ACETAMINOPHEN 5-325 MG PO TABS: 1.0000 | ORAL_TABLET | ORAL | 0 refills | Status: DC | PRN

## 2017-03-13 NOTE — Discharge Instructions (Signed)
[facility] ° °OPEN ABDOMINAL SURGERY: POST OP INSTRUCTIONS ° °Always review your discharge instruction sheet given to you by the facility where your surgery was performed. ° °1. A prescription for pain medication may be given to you upon discharge.  Take your pain medication as prescribed.  If narcotic pain medicine is not needed, then you may take acetaminophen (Tylenol) or ibuprofen (Advil) as needed. °2. Take your usually prescribed medications unless otherwise directed. °3. If you need a refill on your pain medication, please contact your pharmacy. They will contact our office to request authorization.  Prescriptions will not be filled after 5 pm or on weekends. °4. You should follow a light diet the first few days after arrival home, such as soup and crackers, unless your doctor has advised otherwise. A high-fiber, low fat diet can be resumed as tolerated.  Be sure to include plenty of fluids daily.  °5. Most patients will experience some swelling and bruising in the area of the incision. Ice packs will help. Swelling and bruising can take several days to resolve. °6. It is common to experience some constipation if taking pain medication after surgery.  Increasing fluid intake and taking a stool softener will usually help or prevent this problem from occurring.  A mild laxative (Milk of Magnesia or Miralax) should be taken according to package directions if there are no bowel movements after 48 hours. °7.  You may have steri-strips (small skin tapes) in place directly over the incision.  These strips should be left on the skin for 5-7 days.  Any sutures or staples will be removed at the office during your follow-up visit. You may find that a light gauze bandage over your incision may keep your staples from being rubbed or pulled. You may shower and replace the bandage daily. °8. ACTIVITIES:  You may resume regular (light) daily activities beginning the next day - such as daily self-care,  walking, climbing stairs - gradually increasing activities as tolerated.  You may have sexual intercourse when it is comfortable.  Refrain from any heavy lifting or straining until approved by your doctor.  You may drive when you no longer are taking prescription pain medication, you can comfortably wear a seatbelt, and you can safely maneuver your car and apply brakes. °9. You should see your doctor in the office for a follow-up appointment approximately 2-3 weeks after your surgery.  Make sure that you call for this appointment within a day or two after you arrive home to insure a convenient appointment time. ° °WHEN TO CALL YOUR DOCTOR: °1. Fever greater than 101.0 °2. Inability to urinate °3. Persistent nausea and/or vomiting °4. Extreme swelling or bruising °5. Continued bleeding from incision °6. Increased pain, redness, or drainage from the incision °7. Difficulty swallowing or breathing °8. Muscle cramping or spasms °9. Numbness or tingling in hands or around lips ° °IF YOU HAVE DISABILITY OR FAMILY LEAVE FORMS, YOU MUST BRING THEM TO THE OFFICE FOR PROCESSING.  PLEASE DO NOT GIVE THEM TO YOUR DOCTOR. ° °The clinic staff is available to answer your questions during regular business hours.  Please don’t hesitate to call and ask to speak to one of the nurses if you have concerns. ° °[facility] °Office: [phone] ° °For further questions, please visit °[email or web address] ° ° °

## 2017-03-13 NOTE — Progress Notes (Signed)
  General Surgery [facility]  Assessment & Plan: POD#4 - status post left colectomy for suspected diverticular disease             Tolerating regular diet  Pain controlled  Discharge home today        Earnstine Regal, MD, [facility]       Office: [phone]    Chief Complaint: Wants to go home  Subjective: Patient in bed, daughter at bedside.  Comfortable.  Tolerated regular diet.  Having BM's.  Objective: Vital signs in last 24 hours: Temp:  [98.5 F (36.9 C)-98.7 F (37.1 C)] 98.5 F (36.9 C) (07/10 0647) Pulse Rate:  [67-70] 68 (07/10 0647) Resp:  [14-16] 16 (07/10 0647) BP: (147-165)/(73-75) 165/73 (07/10 0647) SpO2:  [94 %-96 %] 96 % (07/10 0647) Last BM Date: 03/11/17  Intake/Output from previous day: 07/09 0701 - 07/10 0700 In: 960 [P.O.:960] Out: 30 [Drains:30] Intake/Output this shift: No intake/output data recorded.  Physical Exam: HEENT - sclerae clear, mucous membranes moist Neck - soft Abdomen - soft without distension; dressing dry; drain with serosanguinous Ext - no edema, non-tender  Lab Results:   Recent Labs  03/11/17 0332  WBC 10.9*  HGB 8.2*  HCT 25.3*  PLT 290   BMET No results for input(s): NA, K, CL, CO2, GLUCOSE, BUN, CREATININE, CALCIUM in the last 72 hours. PT/INR No results for input(s): LABPROT, INR in the last 72 hours. Comprehensive Metabolic Panel:    Component Value Date/Time   NA 136 03/10/2017 0402   NA 138 03/02/2017 1152   K 4.2 03/10/2017 0402   K 3.6 03/09/2017 1114   CL 105 03/10/2017 0402   CL 105 03/02/2017 1152   CO2 20 (L) 03/10/2017 0402   CO2 26 03/02/2017 1152   BUN 14 03/10/2017 0402   BUN 19 03/02/2017 1152   CREATININE 0.77 03/10/2017 0402   CREATININE 1.16 (H) 03/09/2017 1114   GLUCOSE 108 (H) 03/10/2017 0402   GLUCOSE 81 03/02/2017 1152   CALCIUM 7.1 (L) 03/10/2017 0402   CALCIUM 8.5 (L) 03/02/2017 1152   CALCIUM 8.7 10/22/2014 1003   CALCIUM 8.8 07/08/2014 1004   AST 31 03/02/2017 1152   AST 18 10/30/2016 1105   ALT 17 03/02/2017 1152   ALT 8 (L) 10/30/2016 1105   ALKPHOS 97 03/02/2017 1152   ALKPHOS 63 10/30/2016 1105   BILITOT 0.5 03/02/2017 1152   BILITOT 0.5 10/30/2016 1105   PROT 6.9 03/02/2017 1152   PROT 7.7 10/30/2016 1105   ALBUMIN 3.0 (L) 03/02/2017 1152   ALBUMIN 3.3 (L) 10/30/2016 1105    Studies/Results: No results found.    Juancarlos Crescenzo M 03/13/2017  Patient ID: [name], female   DOB: 1934/11/22, 82 y.o.   MRN: [id number]

## 2017-03-16 NOTE — Progress Notes (Signed)
Patient has had partial colectomy for complicated diverticulitis. Please arrange for nonurgent OV f/u with RMR only for anemia/complicated diverticulitis/diarrhea.

## 2017-03-19 ENCOUNTER — Encounter: Payer: Self-pay | Admitting: Internal Medicine

## 2017-03-19 NOTE — Progress Notes (Signed)
OV MADE WITH RMR AND LETTER SENT TO PATIENT  °

## 2017-03-21 ENCOUNTER — Encounter: Payer: Self-pay | Admitting: Internal Medicine

## 2017-04-02 NOTE — Discharge Summary (Signed)
Patient ID: [name] [id number] 81 y.o. 1934/09/28  Admit date: 03/09/2017  Discharge date and time: 03/13/2017 10:47 AM  Admitting Physician: Adin Hector  Discharge Physician: Adin Hector  Admission Diagnoses: Diverticulitis with obstruction [K57.92]  Discharge Diagnoses: diverticulitis with obstruction, subserosal abscess, and stricture                                          Protein calorie malnutrition, moderate                                          Acute blood loss anemia superimposed on chronic iron deficiency anemia                                          History of left breast cancer                                          Weight loss of more than 10% body weight                                           Memory disorder                                           History of surgical repair of hiatal hernia    Operations: Procedure(s): LAPAROSCOPY, TAKEDOWN SPLENIC FLEXURE, LEFT COLECTOMY, PROCTOSCOPY PROCTOSCOPY  Admission Condition: fair  Discharged Condition: fair  Indication for Admission:  This is a 82 year old female who is brought to the hospital electively for left colectomy. She is referred by Dr. Garfield Cornea in [place]. Her PCP is Dr. [name]. She is also followed at the [facility] for anemia and remote history of left breast cancer.. It sounds like she has her several episodes of diverticulitis over the years. Treated with antibiotics. Over the past year she's been having left-sided abdominal pain after she eats and has to run to the bathroom urgently. Stools are  soft. C. difficile has been negative. She has an anemia and has had iron infusions. She has lost 16 pounds in the last 6 weeks. Probably has lost 40 pounds in the last year and she has lost 100 pounds since 12-25-12 when her husband died. She denies vomiting. CT scan performed on February 07, 2017 shows persistent long segment irregular thickening of  the descending colon and proximal sigmoid colon, worrisome some for colonic malignancy. Looks like a mass to me but hopefully this is just burned out diverticulitis. Also a little bit of wall thickening of the splenic flexure and distal transverse colon thought to represent colitis. No evidence for bowel obstruction.  Colonoscopy was performed on November 17, 2016. . Colonoscopy got all the way to the cecum. Four pedunculated polyps in ascending colon which showed tubular adenoma. Pan colonic diverticula. Abnormal 20 cm segment of left colon lumen narrowed.  Edematous. Did not appear neoplastic. Biopsies showed moderately active chronic colitis but no dysplasia  This is high risk due to her anemia, protein calorie malnutrition, altered immune system, memory disorder, partial obstruction. The patient and her family are aware of this She will be scheduled for laparoscopic-assisted left colectomy. Suspect this will be converted to open due to inflammation and rigidity of the colon.  She underwent mechanical and antibiotic bowel prep.  She had a lot of nausea and vomited once but basically seemed to clean out well.  Preop hemoglobin 11.2.  Preop albumin 3.0.    Hospital Course: the patient was admitted electively after a mechanical and antibiotic bowel prep at home.  She was taken to the operating room and underwent, left colectomy with primary anastomosis and rigid proctoscopy.    Operative findings were that the left colon had a firm rubbery mass extending probably 10 inches in length from the distal descending into the proximal sigmoid.  There were some diverticula present.  This felt more like inflammatory disease than cancer.  Once this was resected proximal and distal ends of colon looked healthy, had excellent blood supply and could be brought together without tension.  We chose to perform a primary anastomosis with a handsewn technique.  The anastomosis is probably 15 cm above the  peritoneal reflection.  There was no sign of any metastatic cancer.  The pathologist did a gross exam of felt this was more likely to be diverticulitis.  Rigid proctoscopy was performed at the end of the case and there was no air bubbles or air leak.      The patient progressed in her diet and activities of daily living and is slow but steady and uncomplicated fashion.  Foley catheter was removed.  She had no difficulty voiding.  She resumed liquid diet and was advanced to regular diet.  She began having bowel movements and was tolerating this fairly well before going home.  She had no wound problems.  Telfa wicks were removed from the wound prior to discharge and the wound looked good.        She was given a blood transfusion prior to discharge        Final pathology showed diverticulitis with stricture formation and subserosal abscess.  No malignancy.  She was informed of this.         On the day of discharge she was alert, comfortable, mental status was normal at baseline.  Family was with her and ready to take her home.  Her lungs were clear.  Abdomen was soft and the wound looked good.  She had been having bowel movements and tolerating a regular diet.        She was given a prescription for Norco for pain.  Diet and activities were discussed in detail.  Follow-up was arranged for staple removal in about 7 days. Dr. [name] supervised her discharge planning.       Consults: PT  Significant Diagnostic Studies: surgical pathology  Treatments: surgery: laparoscopic assisted left colectomy with takedown of splenic flexure and rigid proctoscopy                       Blood transfusion                          Disposition: Home  Patient Instructions:  Allergies as of 03/13/2017      Reactions   Metronidazole Other (See Comments)  No taste in mouth and lost 32 lbs   Oxycodone Other (See Comments)    delusions   Ciprofloxacin Rash   Latex Itching, Rash      Medication List    TAKE  these medications   acetaminophen 650 MG CR tablet Commonly known as:  TYLENOL Take 650-1,300 mg by mouth every 8 (eight) hours as needed for pain.   CALCIUM + D PO Take 1 tablet by mouth daily.   CENTRUM SILVER ADULT 50+ PO Take 1 capsule by mouth daily.   HYDROcodone-acetaminophen 5-325 MG tablet Commonly known as:  NORCO/VICODIN Take 1-2 tablets by mouth every 4 (four) hours as needed for moderate pain.   levothyroxine 88 MCG tablet Commonly known as:  SYNTHROID, LEVOTHROID Take 88 mcg by mouth daily before breakfast.   mirtazapine 15 MG tablet Commonly known as:  REMERON Take 1 tablet by mouth at bedtime.   pantoprazole 40 MG tablet Commonly known as:  PROTONIX Take 40 mg by mouth daily.   pravastatin 20 MG tablet Commonly known as:  PRAVACHOL Take 20 mg by mouth daily.       Activity: no driving or lifting more than 15 pounds for 6 weeks.  Ambulate frequently. Diet: low fat, low cholesterol diet Wound Care: patient may shower but otherwise keep wound clean and dry with light dry gauze bandage.  Follow-up:  With Dr. Dalbert Batman in 1 week.  Signed: Edsel Petrin. Dalbert Batman, M.D., FACS General and minimally invasive surgery Breast and Colorectal Surgery  04/02/2017, 7:06 AM

## 2017-04-06 DIAGNOSIS — E039 Hypothyroidism, unspecified: Secondary | ICD-10-CM | POA: Diagnosis not present

## 2017-04-06 DIAGNOSIS — D649 Anemia, unspecified: Secondary | ICD-10-CM | POA: Diagnosis not present

## 2017-04-06 DIAGNOSIS — I10 Essential (primary) hypertension: Secondary | ICD-10-CM | POA: Diagnosis not present

## 2017-04-06 DIAGNOSIS — F331 Major depressive disorder, recurrent, moderate: Secondary | ICD-10-CM | POA: Diagnosis not present

## 2017-04-06 DIAGNOSIS — E782 Mixed hyperlipidemia: Secondary | ICD-10-CM | POA: Diagnosis not present

## 2017-04-06 DIAGNOSIS — Z9189 Other specified personal risk factors, not elsewhere classified: Secondary | ICD-10-CM | POA: Diagnosis not present

## 2017-04-06 DIAGNOSIS — G301 Alzheimer's disease with late onset: Secondary | ICD-10-CM | POA: Diagnosis not present

## 2017-04-13 DIAGNOSIS — N183 Chronic kidney disease, stage 3 (moderate): Secondary | ICD-10-CM | POA: Diagnosis not present

## 2017-04-13 DIAGNOSIS — G301 Alzheimer's disease with late onset: Secondary | ICD-10-CM | POA: Diagnosis not present

## 2017-04-13 DIAGNOSIS — E039 Hypothyroidism, unspecified: Secondary | ICD-10-CM | POA: Diagnosis not present

## 2017-04-13 DIAGNOSIS — M810 Age-related osteoporosis without current pathological fracture: Secondary | ICD-10-CM | POA: Diagnosis not present

## 2017-04-13 DIAGNOSIS — K219 Gastro-esophageal reflux disease without esophagitis: Secondary | ICD-10-CM | POA: Diagnosis not present

## 2017-04-13 DIAGNOSIS — Z6821 Body mass index (BMI) 21.0-21.9, adult: Secondary | ICD-10-CM | POA: Diagnosis not present

## 2017-04-13 DIAGNOSIS — I10 Essential (primary) hypertension: Secondary | ICD-10-CM | POA: Diagnosis not present

## 2017-04-13 DIAGNOSIS — E782 Mixed hyperlipidemia: Secondary | ICD-10-CM | POA: Diagnosis not present

## 2017-04-20 ENCOUNTER — Other Ambulatory Visit (HOSPITAL_COMMUNITY): Payer: Medicare Other

## 2017-04-20 ENCOUNTER — Ambulatory Visit (HOSPITAL_COMMUNITY): Payer: Medicare Other

## 2017-05-11 ENCOUNTER — Ambulatory Visit: Payer: Medicare Other | Admitting: Internal Medicine

## 2017-05-15 ENCOUNTER — Other Ambulatory Visit: Payer: Self-pay

## 2017-05-15 ENCOUNTER — Ambulatory Visit (INDEPENDENT_AMBULATORY_CARE_PROVIDER_SITE_OTHER): Payer: Medicare Other | Admitting: Internal Medicine

## 2017-05-15 ENCOUNTER — Encounter: Payer: Self-pay | Admitting: Internal Medicine

## 2017-05-15 VITALS — BP 157/88 | HR 76 | Temp 97.6°F | Ht 66.0 in | Wt 137.2 lb

## 2017-05-15 DIAGNOSIS — R151 Fecal smearing: Secondary | ICD-10-CM

## 2017-05-15 DIAGNOSIS — R197 Diarrhea, unspecified: Secondary | ICD-10-CM

## 2017-05-15 DIAGNOSIS — D649 Anemia, unspecified: Secondary | ICD-10-CM

## 2017-05-15 NOTE — Progress Notes (Signed)
Primary Care Physician:  Caryl Bis, MD Primary Gastroenterologist:  Dr. Gala Romney Pre-Procedure History & Physical: HPI:  [name] is a 82 y.o. female here for follow-up of the diarrhea and bouts of incontinence. Status post left hemicolectomy for what turned out to be diverticulitis with abscess formation. Had a left colon stricture .  Doing very well post operatively. She eats well; not having any abdominal pain.  However,she has been plagued by postprandial urgency/ diarrhea and bouts of incontinence. She hasn't had any rectal bleeding.  She has gained 5 pounds as compared to her weight preoperatively earlier in the year. She states she eats everything she wants. No nausea or vomiting. Bowels are too loose as she reports. C. difficile was negative earlier in the year.  She has a history of IBS.  Imodium has blunted some of the above symptoms.  Past Medical History:  Diagnosis Date  . Breast cancer ([place]) 2003  . Diverticulitis    History  . Dyslexia   . GERD (gastroesophageal reflux disease)    takes Nexium daily  . H/O hiatal hernia   . Headache(784.0)    all the time  . History of esophageal spasm   . History of IBS    no problem in past yr  . History of kidney stones   . History of shingles   . HTN (hypertension)    hx of  . Hyperlipidemia    takes Pravastatin daily  . Hypokalemia   . Hypothyroidism    takes Synthroid daily  . Impaired hearing   . Joint pain   . Joint swelling   . Neck pain    pinched   . Osteoarthritis   . Osteoporosis 09/08/2011  . PONV (postoperative nausea and vomiting)   . Syncope, non cardiac   . Weakness    numbness left arm    Past Surgical History:  Procedure Laterality Date  . Arm Fx     left  . BREAST LUMPECTOMY     Left  . CARDIAC CATHETERIZATION     x 3-last one in the 90's per pt  . CATARACT EXTRACTION    . CATARACT EXTRACTION W/PHACO  10/02/2011   Procedure: CATARACT EXTRACTION PHACO AND INTRAOCULAR LENS PLACEMENT  (IOC);  Surgeon: [name], MD;  Location: [facility];  Service: Ophthalmology;  Laterality: Right;  CDE:17.01  . CHOLECYSTECTOMY  1997   Status post -stones  . COLONOSCOPY  2012   Dr. Gala Romney: pancolonic diverticulosis, tubular adenoma removed from cecum. next TCS 2019  . COLONOSCOPY N/A 11/17/2016   Procedure: COLONOSCOPY;  Surgeon: [name], MD;  [facility];  Service: Endoscopy;  Laterality: N/A;  1200 - moved to 3/16 @ 12:00  . EGD with dilitation     2004/2005 schatki's ring s/p dilation, wrap no longer intact on 2005 egd.  . ESOPHAGOGASTRODUODENOSCOPY N/A 11/17/2016   Procedure: ESOPHAGOGASTRODUODENOSCOPY (EGD);  Surgeon: [name], MD;  [facility];  Service: Endoscopy;  Laterality: N/A;  . kidney stent  06/04/11   removed after about 3 weeks.  Marland Kitchen [place]  . KNEE SURGERY     bilateral  . LAPAROSCOPIC NISSEN FUNDOPLICATION  9485  . LAPAROSCOPIC PARTIAL COLECTOMY N/A 03/09/2017   Procedure: LAPAROSCOPY, TAKEDOWN SPLENIC FLEXURE, LEFT COLECTOMY, PROCTOSCOPY;  Surgeon: [name], MD;  Location: [facility];  Service: General;  Laterality: N/A;  . LITHOTRIPSY  11/12  . MANDIBLE RECONSTRUCTION    . NECK SURGERY  2000   [facility]  . PARATHYROIDECTOMY  12/03/2013   DR TEOH  . PROCTOSCOPY  03/09/2017   Procedure: PROCTOSCOPY;  Surgeon: [name], MD;  Location: [facility];  Service: General;;  . SHOULDER SURGERY Left   . THYROIDECTOMY     Hypothyroidism status post  . THYROIDECTOMY N/A 12/03/2013   Procedure: PARATHYROIDECTOMY;  Surgeon: [name], MD;  Location: [facility];  Service: ENT;  Laterality: N/A;  . TUBAL LIGATION    . WRIST SURGERY Left 2009    Prior to Admission medications   Medication Sig Start Date End Date Taking? Authorizing Provider  acetaminophen (TYLENOL) 650 MG CR tablet Take 650-1,300 mg by mouth every 8 (eight) hours as needed for pain.   Yes [provider]  Calcium Citrate-Vitamin D (CALCIUM + D PO) Take 1 tablet  by mouth daily.   Yes [provider]  HYDROcodone-acetaminophen (NORCO/VICODIN) 5-325 MG tablet Take 1-2 tablets by mouth every 4 (four) hours as needed for moderate pain. 03/13/17  Yes [name], MD  levothyroxine (SYNTHROID, LEVOTHROID) 88 MCG tablet Take 88 mcg by mouth daily before breakfast.   Yes [provider]  Multiple Vitamins-Minerals (CENTRUM SILVER ADULT 50+ PO) Take 1 capsule by mouth daily.   Yes [provider]  pantoprazole (PROTONIX) 40 MG tablet Take 40 mg by mouth daily.   Yes [provider]  pravastatin (PRAVACHOL) 20 MG tablet Take 20 mg by mouth daily.   Yes [provider]  mirtazapine (REMERON) 15 MG tablet Take 1 tablet by mouth at bedtime. 02/17/17   [provider]    Allergies as of 05/15/2017 - Review Complete 05/15/2017  Allergen Reaction Noted  . Metronidazole Other (See Comments) 09/12/2011  . Oxycodone Other (See Comments) 11/07/2010  . Ciprofloxacin Rash 11/07/2010  . Latex Itching and Rash 11/21/2013    Family History  Problem Relation Age of Onset  . Macular degeneration Sister   . Lung cancer Sister   . Pancreatic cancer Mother   . Heart attack Father   . Anesthesia problems Neg Hx   . Hypotension Neg Hx   . Pseudochol deficiency Neg Hx   . Malignant hyperthermia Neg Hx   . Colon cancer Neg Hx     Social History   Social History  . Marital status: Widowed    Spouse name: N/A  . Number of children: N/A  . Years of education: N/A   Occupational History  . Not on file.   Social History Main Topics  . Smoking status: Never Smoker  . Smokeless tobacco: Never Used  . Alcohol use No  . Drug use: No  . Sexual activity: No   Other Topics Concern  . Not on file   Social History Narrative  . No narrative on file    Review of Systems: See HPI, otherwise negative ROS  Physical Exam: BP (!) 157/88   Pulse 76   Temp 97.6 F (36.4 C) (Oral)   Ht 5\' 6"  (1.676 m)   Wt 137 lb  3.2 oz (62.2 kg)   BMI 22.14 kg/m  General:   Alert,  , pleasant and cooperative in NAD Neck:  Supple; no masses or thyromegaly. No significant cervical adenopathy. Lungs:  Clear throughout to auscultation.   No wheezes, crackles, or rhonchi. No acute distress. Heart:  Regular rate and rhythm; no murmurs, clicks, rubs,  or gallops. Abdomen: Non-distended, Recent midline vertical surgical scar healing well. Mild tenderness around the scar no obvious mass or  organomegaly normal bowel sounds.  Soft and nontender without appreciable mass or hepatosplenomegaly.  Pulses:  Normal pulses noted. Extremities:  Without clubbing or edema.  Impression:  Pleasant 82 year old lady status post recent left hemicolectomy for diverticulitis with abscess. She weathered surgery remarkably well and is actually doing well postoperatively aside from loose stools and bouts of incontinence. She has baseline IBS. Her GI tract is been foreshortened. She did have a stenosis in the diseased segment of colon removed. Weight gain is reassuring.    Recommendations:  CBC and chem 12 today.  Will send a stool sample off for C. difficile and G P  Make specific recommendations regarding management of her bowel symptoms in the near future once above labs have returned for a review.     Notice: This dictation was prepared with Dragon dictation along with smaller phrase technology. Any transcriptional errors that result from this process are unintentional and may not be corrected upon review.

## 2017-05-15 NOTE — Patient Instructions (Signed)
Stool for cdiff and GIP  CBC, Chem12 today  Further recommendations to follow

## 2017-06-02 DIAGNOSIS — Z23 Encounter for immunization: Secondary | ICD-10-CM | POA: Diagnosis not present

## 2017-07-03 ENCOUNTER — Emergency Department (HOSPITAL_COMMUNITY)
Admission: EM | Admit: 2017-07-03 | Discharge: 2017-07-03 | Disposition: A | Discharge status: Home / Self Care | Payer: Medicare Other | Attending: Emergency Medicine | Admitting: Emergency Medicine

## 2017-07-03 ENCOUNTER — Encounter (HOSPITAL_COMMUNITY): Payer: Self-pay | Admitting: *Deleted

## 2017-07-03 ENCOUNTER — Emergency Department (HOSPITAL_COMMUNITY): Payer: Medicare Other

## 2017-07-03 DIAGNOSIS — R109 Unspecified abdominal pain: Secondary | ICD-10-CM | POA: Insufficient documentation

## 2017-07-03 DIAGNOSIS — K5730 Diverticulosis of large intestine without perforation or abscess without bleeding: Secondary | ICD-10-CM | POA: Diagnosis not present

## 2017-07-03 DIAGNOSIS — Z79899 Other long term (current) drug therapy: Secondary | ICD-10-CM | POA: Diagnosis not present

## 2017-07-03 DIAGNOSIS — Z791 Long term (current) use of non-steroidal anti-inflammatories (NSAID): Secondary | ICD-10-CM | POA: Diagnosis not present

## 2017-07-03 DIAGNOSIS — E039 Hypothyroidism, unspecified: Secondary | ICD-10-CM | POA: Diagnosis not present

## 2017-07-03 DIAGNOSIS — Z91040 Latex allergy status: Secondary | ICD-10-CM | POA: Diagnosis not present

## 2017-07-03 DIAGNOSIS — I10 Essential (primary) hypertension: Secondary | ICD-10-CM | POA: Diagnosis not present

## 2017-07-03 DIAGNOSIS — K625 Hemorrhage of anus and rectum: Secondary | ICD-10-CM | POA: Insufficient documentation

## 2017-07-03 LAB — COMPREHENSIVE METABOLIC PANEL
ALT: 17 U/L (ref 14–54)
AST: 24 U/L (ref 15–41)
Albumin: 3.7 g/dL (ref 3.5–5.0)
Alkaline Phosphatase: 50 U/L (ref 38–126)
Anion gap: 8 (ref 5–15)
BUN: 18 mg/dL (ref 6–20)
CHLORIDE: 103 mmol/L (ref 101–111)
CO2: 29 mmol/L (ref 22–32)
CREATININE: 0.88 mg/dL (ref 0.44–1.00)
Calcium: 9 mg/dL (ref 8.9–10.3)
Glucose, Bld: 80 mg/dL (ref 65–99)
Potassium: 3.5 mmol/L (ref 3.5–5.1)
SODIUM: 140 mmol/L (ref 135–145)
Total Bilirubin: 0.2 mg/dL — ABNORMAL LOW (ref 0.3–1.2)
Total Protein: 7 g/dL (ref 6.5–8.1)

## 2017-07-03 LAB — I-STAT CHEM 8, ED
BUN: 19 mg/dL (ref 6–20)
CALCIUM ION: 1.2 mmol/L (ref 1.15–1.40)
CHLORIDE: 103 mmol/L (ref 101–111)
Creatinine, Ser: 1 mg/dL (ref 0.44–1.00)
Glucose, Bld: 77 mg/dL (ref 65–99)
HCT: 36 % (ref 36.0–46.0)
HEMOGLOBIN: 12.2 g/dL (ref 12.0–15.0)
Potassium: 3.6 mmol/L (ref 3.5–5.1)
SODIUM: 142 mmol/L (ref 135–145)
TCO2: 30 mmol/L (ref 22–32)

## 2017-07-03 LAB — CBC
HCT: 38.2 % (ref 36.0–46.0)
Hemoglobin: 12.1 g/dL (ref 12.0–15.0)
MCH: 28.2 pg (ref 26.0–34.0)
MCHC: 31.7 g/dL (ref 30.0–36.0)
MCV: 89 fL (ref 78.0–100.0)
PLATELETS: 210 10*3/uL (ref 150–400)
RBC: 4.29 MIL/uL (ref 3.87–5.11)
RDW: 13.6 % (ref 11.5–15.5)
WBC: 7 10*3/uL (ref 4.0–10.5)

## 2017-07-03 LAB — DIFFERENTIAL
BASOS ABS: 0 10*3/uL (ref 0.0–0.1)
BASOS PCT: 0 %
EOS ABS: 0.2 10*3/uL (ref 0.0–0.7)
Eosinophils Relative: 4 %
Lymphocytes Relative: 40 %
Lymphs Abs: 2.7 10*3/uL (ref 0.7–4.0)
Monocytes Absolute: 0.7 10*3/uL (ref 0.1–1.0)
Monocytes Relative: 10 %
NEUTROS PCT: 46 %
Neutro Abs: 3.1 10*3/uL (ref 1.7–7.7)

## 2017-07-03 LAB — TYPE AND SCREEN
ABO/RH(D): A POS
Antibody Screen: NEGATIVE

## 2017-07-03 MED ORDER — IOPAMIDOL (ISOVUE-300) INJECTION 61%
100.0000 mL | Freq: Once | INTRAVENOUS | Status: AC | PRN
  Administered 2017-07-03: 100 mL via INTRAVENOUS

## 2017-07-03 MED ORDER — AMOXICILLIN-POT CLAVULANATE 875-125 MG PO TABS: 1.0000 | ORAL_TABLET | Freq: Two times a day (BID) | ORAL | 0 refills | Status: DC

## 2017-07-03 MED ORDER — SODIUM CHLORIDE 0.9 % IV BOLUS (SEPSIS)
500.0000 mL | Freq: Once | INTRAVENOUS | Status: AC
  Administered 2017-07-03: 500 mL via INTRAVENOUS

## 2017-07-03 MED ORDER — AMOXICILLIN-POT CLAVULANATE 875-125 MG PO TABS
1.0000 | ORAL_TABLET | Freq: Once | ORAL | Status: AC
  Administered 2017-07-03: 1 via ORAL
  Filled 2017-07-03: qty 1

## 2017-07-03 NOTE — ED Notes (Signed)
Pt alert & oriented x4, stable gait. Patient  given discharge instructions, paperwork & prescription(s). Patient verbalized understanding. Pt left department w/ no further questions. 

## 2017-07-03 NOTE — ED Triage Notes (Signed)
Pt c/o abdominal pain that started this morning, back pain and rectal bleeding this evening. Pt reports bright red bleeding per rectum x 1 this evening. Pt reports partial colectomy due to infection in July.

## 2017-07-03 NOTE — Discharge Instructions (Signed)
Follow up with Dr. Gala Romney this week return if heavy bleeding occurs again

## 2017-07-03 NOTE — ED Provider Notes (Signed)
[name] EMERGENCY DEPARTMENT Provider Note   CSN: [id number] Arrival date & time: 07/03/17  1846     History   Chief Complaint Chief Complaint  Patient presents with  . Rectal Bleeding    HPI [name] is a 82 y.o. female.  Patient states she had some rectal bleeding today with some abdominal discomfort.  The pain is improved now and she has had no bleeding recently   The history is provided by the patient.  Rectal Bleeding  Quality:  Bright red Amount:  Moderate Timing:  Rare Chronicity:  New Context: not anal fissures   Similar prior episodes: yes   Relieved by:  Nothing Worsened by:  Nothing Associated symptoms: abdominal pain     Past Medical History:  Diagnosis Date  . Breast cancer ([name]) 2003  . Diverticulitis    History  . Dyslexia   . GERD (gastroesophageal reflux disease)    takes Nexium daily  . H/O hiatal hernia   . Headache(784.0)    all the time  . History of esophageal spasm   . History of IBS    no problem in past yr  . History of kidney stones   . History of shingles   . HTN (hypertension)    hx of  . Hyperlipidemia    takes Pravastatin daily  . Hypokalemia   . Hypothyroidism    takes Synthroid daily  . Impaired hearing   . Joint pain   . Joint swelling   . Neck pain    pinched   . Osteoarthritis   . Osteoporosis 09/08/2011  . PONV (postoperative nausea and vomiting)   . Syncope, non cardiac   . Weakness    numbness left arm    Patient Active Problem List   Diagnosis Date Noted  . H/O colectomy 03/09/2017  . Abnormal CT scan, colon 02/09/2017  . Abnormal weight loss 12/27/2016  . Left sided colitis ([place]) 12/27/2016  . IDA (iron deficiency anemia) 10/18/2016  . Heme positive stool 10/18/2016  . S/P parathyroidectomy ([place]) 12/03/2013  . Neck mass 10/30/2013  . Diverticulitis 10/26/2013  . Generalized weakness 10/26/2013  . Diverticulitis with obstruction 10/26/2013  . Infiltrating lobular carcinoma, left breast  04/04/2013  . Syncope and collapse 02/21/2012  . Chest pain 02/21/2012  . Dehydration 02/21/2012  . Acute renal failure ([place]) 02/21/2012  . HTN (hypertension) 02/21/2012  . Hypothyroid 02/21/2012  . GERD (gastroesophageal reflux disease) 02/21/2012  . Osteoporosis 09/08/2011  . FULL INCONTINENCE OF FECES 11/07/2010  . DIARRHEA 11/07/2010    Past Surgical History:  Procedure Laterality Date  . Arm Fx     left  . BREAST LUMPECTOMY     Left  . CARDIAC CATHETERIZATION     x 3-last one in the 90's per pt  . CATARACT EXTRACTION    . CATARACT EXTRACTION W/PHACO  10/02/2011   Procedure: CATARACT EXTRACTION PHACO AND INTRAOCULAR LENS PLACEMENT (IOC);  Surgeon: [name], MD;  Location: [facility];  Service: Ophthalmology;  Laterality: Right;  CDE:17.01  . CHOLECYSTECTOMY  1997   Status post -stones  . COLONOSCOPY  2012   Dr. Gala Romney: pancolonic diverticulosis, tubular adenoma removed from cecum. next TCS 2019  . COLONOSCOPY N/A 11/17/2016   Procedure: COLONOSCOPY;  Surgeon: [name], MD;  [facility];  Service: Endoscopy;  Laterality: N/A;  1200 - moved to 3/16 @ 12:00  . EGD with dilitation     2004/2005 schatki's ring s/p dilation, wrap no longer  intact on 2005 egd.  . ESOPHAGOGASTRODUODENOSCOPY N/A 11/17/2016   Procedure: ESOPHAGOGASTRODUODENOSCOPY (EGD);  Surgeon: [name], MD;  [facility];  Service: Endoscopy;  Laterality: N/A;  . kidney stent  06/04/11   removed after about 3 weeks.  Marland Kitchen [place]  . KNEE SURGERY     bilateral  . LAPAROSCOPIC NISSEN FUNDOPLICATION  7846  . LAPAROSCOPIC PARTIAL COLECTOMY N/A 03/09/2017   Procedure: LAPAROSCOPY, TAKEDOWN SPLENIC FLEXURE, LEFT COLECTOMY, PROCTOSCOPY;  Surgeon: [name], MD;  [facility];  Service: General;  Laterality: N/A;  . LITHOTRIPSY  11/12  . MANDIBLE RECONSTRUCTION    . NECK SURGERY  2000   [facility]  . PARATHYROIDECTOMY  12/03/2013   DR TEOH  . PROCTOSCOPY  03/09/2017    Procedure: PROCTOSCOPY;  Surgeon: [name], MD;  [facility];  Service: General;;  . SHOULDER SURGERY Left   . THYROIDECTOMY     Hypothyroidism status post  . THYROIDECTOMY N/A 12/03/2013   Procedure: PARATHYROIDECTOMY;  Surgeon: [name], MD;  Location: [place];  Service: ENT;  Laterality: N/A;  . TUBAL LIGATION    . WRIST SURGERY Left 2009    OB History    No data available       Home Medications    Prior to Admission medications   Medication Sig Start Date End Date Taking? Authorizing Provider  acetaminophen (TYLENOL) 650 MG CR tablet Take 650-1,300 mg by mouth every 8 (eight) hours as needed for pain.   Yes [provider]  Calcium Citrate-Vitamin D (CALCIUM + D PO) Take 1 tablet by mouth daily.   Yes [provider]  levothyroxine (SYNTHROID, LEVOTHROID) 88 MCG tablet Take 88 mcg by mouth daily before breakfast.   Yes [provider]  Multiple Vitamins-Minerals (CENTRUM SILVER ADULT 50+ PO) Take 1 capsule by mouth daily.   Yes [provider]  pantoprazole (PROTONIX) 40 MG tablet Take 40 mg by mouth daily.   Yes [provider]  pravastatin (PRAVACHOL) 20 MG tablet Take 20 mg by mouth every morning.    Yes [provider]  amoxicillin-clavulanate (AUGMENTIN) 875-125 MG tablet Take 1 tablet by mouth 2 (two) times daily. One po bid x 7 days 07/03/17   [name], MD  HYDROcodone-acetaminophen (NORCO/VICODIN) 5-325 MG tablet Take 1-2 tablets by mouth every 4 (four) hours as needed for moderate pain. Patient not taking: Reported on 07/03/2017 03/13/17   [name], MD    Family History Family History  Problem Relation Age of Onset  . Macular degeneration Sister   . Lung cancer Sister   . Pancreatic cancer Mother   . Heart attack Father   . Anesthesia problems Neg Hx   . Hypotension Neg Hx   . Pseudochol deficiency Neg Hx   . Malignant hyperthermia Neg Hx   . Colon cancer Neg Hx     Social  History Social History  Substance Use Topics  . Smoking status: Never Smoker  . Smokeless tobacco: Never Used  . Alcohol use No     Allergies   Metronidazole; Oxycodone; Ciprofloxacin; and Latex   Review of Systems Review of Systems  Constitutional: Negative for appetite change and fatigue.  HENT: Negative for congestion, ear discharge and sinus pressure.   Eyes: Negative for discharge.  Respiratory: Negative for cough.   Cardiovascular: Negative for chest pain.  Gastrointestinal: Positive for abdominal pain, blood in stool and hematochezia. Negative for diarrhea.  Genitourinary: Negative for frequency and  hematuria.  Musculoskeletal: Negative for back pain.  Skin: Negative for rash.  Neurological: Negative for seizures and headaches.  Psychiatric/Behavioral: Negative for hallucinations.     Physical Exam Updated Vital Signs BP (!) 163/74   Pulse 76   Temp 99.1 F (37.3 C) (Oral)   Resp (!) 23   Ht 5' 6.5" (1.689 m)   Wt 65.3 kg (144 lb)   SpO2 (!) 86%   BMI 22.89 kg/m   Physical Exam  Constitutional: She is oriented to person, place, and time. She appears well-developed.  HENT:  Head: Normocephalic.  Eyes: Conjunctivae and EOM are normal. No scleral icterus.  Neck: Neck supple. No thyromegaly present.  Cardiovascular: Normal rate and regular rhythm.  Exam reveals no gallop and no friction rub.   No murmur heard. Pulmonary/Chest: No stridor. She has no wheezes. She has no rales. She exhibits no tenderness.  Abdominal: She exhibits no distension. There is no tenderness. There is no rebound.  Musculoskeletal: Normal range of motion. She exhibits no edema.  Lymphadenopathy:    She has no cervical adenopathy.  Neurological: She is oriented to person, place, and time. She exhibits normal muscle tone. Coordination normal.  Skin: No rash noted. No erythema.  Psychiatric: She has a normal mood and affect. Her behavior is normal.     ED Treatments / Results   Labs (all labs ordered are listed, but only abnormal results are displayed) Labs Reviewed  COMPREHENSIVE METABOLIC PANEL - Abnormal; Notable for the following:       Result Value   Total Bilirubin 0.2 (*)    All other components within normal limits  CBC  DIFFERENTIAL  I-STAT CHEM 8, ED  POC OCCULT BLOOD, ED  TYPE AND SCREEN    EKG  EKG Interpretation None       Radiology Ct Abdomen Pelvis W Contrast  Result Date: 07/03/2017 CLINICAL DATA:  Abdominal pain, back pain and rectal bleeding. EXAM: CT ABDOMEN AND PELVIS WITH CONTRAST TECHNIQUE: Multidetector CT imaging of the abdomen and pelvis was performed using the standard protocol following bolus administration of intravenous contrast. CONTRAST:  134mL ISOVUE-300 IOPAMIDOL (ISOVUE-300) INJECTION 61% COMPARISON:  02/07/2017 FINDINGS: Lower chest: Moderate in size hiatal hernia. Hepatobiliary: Post cholecystectomy. Too small to be actually characterized right hepatic subcentimeter hypoattenuated nodules. Pancreas: Unremarkable. No pancreatic ductal dilatation or surrounding inflammatory changes. Spleen: Few mm hypoattenuated lesion with uncertain significance. Adrenals/Urinary Tract: Normal adrenal glands. Cortical thinning of the kidneys. Several bilateral hypoattenuated circumscribed masses, the larger of which are consistent with cysts. Some too small to be actually characterize by CT. Normal urinary bladder. Stomach/Bowel: Stomach is within normal limits. Probable duodenal diverticulum. No evidence of small-bowel obstruction. Scattered colonic diverticulosis. Mild nonspecific mucosal thickening of the transverse and descending colon. Vascular/Lymphatic: Aortic atherosclerosis. No enlarged abdominal or pelvic lymph nodes. Reproductive: Status post hysterectomy. No adnexal masses. Other: No abdominal wall hernia or abnormality. No abdominopelvic ascites. Musculoskeletal: Osteopenia. Exaggerated lumbosacral lordosis. Multilevel  osteoarthritic changes at the lumbosacral spine level. IMPRESSION: Several too small to be actually characterize right hepatic subcentimeter hypoattenuated nodules. Tiny hypoattenuated splenic nodule with uncertain significance. Bilateral renal cysts. Probable duodenum diverticulum of the first portion of the duodenum. Scattered colonic diverticulosis. Mild nonspecific mucosal thickening of the transverse and descending colon, favor infectious/inflammatory. Electronically Signed   By: Fidela Salisbury M.D.   On: 07/03/2017 21:42    Procedures Procedures (including critical care time)  Medications Ordered in ED Medications  amoxicillin-clavulanate (AUGMENTIN) 875-125 MG per tablet 1 tablet (  not administered)  sodium chloride 0.9 % bolus 500 mL (500 mLs Intravenous New Bag/Given 07/03/17 2053)  iopamidol (ISOVUE-300) 61 % injection 100 mL (100 mLs Intravenous Contrast Given 07/03/17 2115)     Initial Impression / Assessment and Plan / ED Course  I have reviewed the triage vital signs and the nursing notes.  Pertinent labs & imaging results that were available during my care of the patient were reviewed by me and considered in my medical decision making (see chart for details).     Patient with rectal bleeding.  Labs unremarkable.  CT scan shows some inflammation in her colon.  Patient is hemodynamically stable and wants to go home.  She has not had any more bleeding in the emergency department.  She will be placed on antibiotics and referred back to her GI doctor to be seen this week  Final Clinical Impressions(s) / ED Diagnoses   Final diagnoses:  Rectal bleeding    New Prescriptions New Prescriptions   AMOXICILLIN-CLAVULANATE (AUGMENTIN) 875-125 MG TABLET    Take 1 tablet by mouth 2 (two) times daily. One po bid x 7 days     [name], MD 07/03/17 2219

## 2017-07-04 ENCOUNTER — Ambulatory Visit (INDEPENDENT_AMBULATORY_CARE_PROVIDER_SITE_OTHER): Payer: Medicare Other | Admitting: Gastroenterology

## 2017-07-04 ENCOUNTER — Telehealth: Payer: Self-pay | Admitting: Internal Medicine

## 2017-07-04 ENCOUNTER — Encounter: Payer: Self-pay | Admitting: Gastroenterology

## 2017-07-04 DIAGNOSIS — K625 Hemorrhage of anus and rectum: Secondary | ICD-10-CM | POA: Insufficient documentation

## 2017-07-04 NOTE — Telephone Encounter (Signed)
Pt is coming today at 230 to see AB

## 2017-07-04 NOTE — Patient Instructions (Addendum)
Please let me know if anything changes or you have recurrence of your symptoms. I would hold off on the antibiotics for now. Follow a low fiber (low residue diet) for the next few days.   You can start taking a probiotic daily such as Align, [place], [place].  We will see you in 6 weeks!  Low-Fiber Diet Fiber is found in fruits, vegetables, and whole grains. A low-fiber diet restricts fibrous foods that are not digested in the small intestine. A diet containing about 10-15 grams of fiber per day is considered low fiber. Low-fiber diets may be used to:  Promote healing and rest the bowel during intestinal flare-ups.  Prevent blockage of a partially obstructed or narrowed gastrointestinal tract.  Reduce fecal weight and volume.  Slow the movement of feces.  You may be on a low-fiber diet as a transitional diet following surgery, after an injury (trauma), or because of a short (acute) or lifelong (chronic) illness. Your health care provider will determine the length of time you need to stay on this diet. What do I need to know about a low-fiber diet? Always check the fiber content on the packaging's Nutrition Facts label, especially on foods from the grains list. Ask your dietitian if you have questions about specific foods that are related to your condition, especially if the food is not listed below. In general, a low-fiber food will have less than 2 g of fiber. What foods can I eat? Grains All breads and crackers made with white flour. Sweet rolls, doughnuts, waffles, pancakes, [place] toast, bagels. Pretzels, Melba toast, zwieback. Well-cooked cereals, such as cornmeal, farina, or cream cereals. Dry cereals that do not contain whole grains, fruit, or nuts, such as refined corn, wheat, rice, and oat cereals. Potatoes prepared any way without skins, plain pastas and noodles, refined white rice. Use white flour for baking and making sauces. Use allowed list of  grains for casseroles, dumplings, and puddings. Vegetables Strained tomato and vegetable juices. Fresh lettuce, cucumber, spinach. Well-cooked (no skin or pulp) or canned vegetables, such as asparagus, bean sprouts, beets, carrots, green beans, mushrooms, potatoes, pumpkin, spinach, yellow squash, tomato sauce/puree, turnips, yams, and zucchini. Keep servings limited to  cup. Fruits All fruit juices except prune juice. Cooked or canned fruits without skin and seeds, such as applesauce, apricots, cherries, fruit cocktail, grapefruit, grapes, mandarin oranges, melons, peaches, pears, pineapple, and plums. Fresh fruits without skin, such as apricots, avocados, bananas, melons, pineapple, nectarines, and peaches. Keep servings limited to  cup or 1 piece. Meat and Other Protein Sources Ground or well-cooked tender beef, ham, veal, lamb, pork, or poultry. Eggs, plain cheese. Fish, oysters, shrimp, lobster, and other seafood. Liver, organ meats. Smooth nut butters. Dairy All milk products and alternative dairy substitutes, such as soy, rice, almond, and coconut, not containing added whole nuts, seeds, or added fruit. Beverages Decaf coffee, fruit, and vegetable juices or smoothies (small amounts, with no pulp or skins, and with fruits from allowed list), sports drinks, herbal tea. Condiments Ketchup, mustard, vinegar, cream sauce, cheese sauce, cocoa powder. Spices in moderation, such as allspice, basil, bay leaves, celery powder or leaves, cinnamon, cumin powder, curry powder, ginger, mace, marjoram, onion or garlic powder, oregano, paprika, parsley flakes, ground pepper, rosemary, sage, savory, tarragon, thyme, and turmeric. Sweets and Desserts Plain cakes and cookies, pie made with allowed fruit, pudding, custard, cream pie. Gelatin, fruit, ice, sherbet, frozen ice pops. Ice cream, ice milk without nuts. Plain hard candy, honey,  jelly, molasses, syrup, sugar, chocolate syrup, gumdrops, marshmallows.  Limit overall sugar intake. Fats and Oil Margarine, butter, cream, mayonnaise, salad oils, plain salad dressings made from allowed foods. Choose healthy fats such as olive oil, canola oil, and omega-3 fatty acids (such as found in salmon or tuna) when possible. Other Bouillon, broth, or cream soups made from allowed foods. Any strained soup. Casseroles or mixed dishes made with allowed foods. The items listed above may not be a complete list of recommended foods or beverages. Contact your dietitian for more options. What foods are not recommended? Grains All whole wheat and whole grain breads and crackers. Multigrains, rye, bran seeds, nuts, or coconut. Cereals containing whole grains, multigrains, bran, coconut, nuts, raisins. Cooked or dry oatmeal, steel-cut oats. Coarse wheat cereals, granola. Cereals advertised as high fiber. Potato skins. Whole grain pasta, wild or brown rice. Popcorn. Coconut flour. Bran, buckwheat, corn bread, multigrains, rye, wheat germ. Vegetables Fresh, cooked or canned vegetables, such as artichokes, asparagus, beet greens, broccoli, Brussels sprouts, cabbage, celery, cauliflower, corn, eggplant, kale, legumes or beans, okra, peas, and tomatoes. Avoid large servings of any vegetables, especially raw vegetables. Fruits Fresh fruits, such as apples with or without skin, berries, cherries, figs, grapes, grapefruit, guavas, kiwis, mangoes, oranges, papayas, pears, persimmons, pineapple, and pomegranate. Prune juice and juices with pulp, stewed or dried prunes. Dried fruits, dates, raisins. Fruit seeds or skins. Avoid large servings of all fresh fruits. Meats and Other Protein Sources Tough, fibrous meats with gristle. Chunky nut butter. Cheese made with seeds, nuts, or other foods not recommended. Nuts, seeds, legumes (beans, including baked beans), dried peas, beans, lentils. Dairy Yogurt or cheese that contains nuts, seeds, or added fruit. Beverages Fruit juices with high  pulp, prune juice. Caffeinated coffee and teas. Condiments Coconut, maple syrup, pickles, olives. Sweets and Desserts Desserts, cookies, or candies that contain nuts or coconut, chunky peanut butter, dried fruits. Jams, preserves with seeds, marmalade. Large amounts of sugar and sweets. Any other dessert made with fruits from the not recommended list. Other Soups made from vegetables that are not recommended or that contain other foods not recommended. The items listed above may not be a complete list of foods and beverages to avoid. Contact your dietitian for more information. This information is not intended to replace advice given to you by your health care provider. Make sure you discuss any questions you have with your health care provider. Document Released: 02/10/2002 Document Revised: 01/27/2016 Document Reviewed: 07/14/2013 Elsevier Interactive Patient Education  2017 [employer].

## 2017-07-04 NOTE — Progress Notes (Signed)
Referring Provider: Caryl Bis, MD Primary Care Physician:  Caryl Bis, MD Primary GI: Dr. Gala Romney   Chief Complaint  Patient presents with  . Rectal Bleeding    w/ BM happened once yesterday  . Abdominal Pain    x 1 day    HPI:   [name] is a 82 y.o. female presenting today with a history of diarrhea and incontinence. Status post left hemicolectomy secondary to diverticulitis with abscess formation. Had left colon stricture. July 2018.   Here due to abdominal pain and rectal bleeding, seen in ED yesterday evening. Mild non-specific mucosal thickening of transverse and descending colon, favor infectious/inflammatory. Prescribed Augmentin but she hasn't started taking this.   States incision hurts a lot, but this was inside. Felt crampy. Felt bubbles moving around, gurgling. Had episode of large volume hematochezia. No further rectal bleeding. Clinically improved. Good appetite. Eats junk. 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 No fever. Stays cold all the time. Occasional diarrhea depending on what she eats. Has postprandial urgency for quite a few years. Had Cdiff ordered in September but stool was formed.   Past Medical History:  Diagnosis Date  . Breast cancer ([place]) 2003  . Diverticulitis    History  . Dyslexia   . GERD (gastroesophageal reflux disease)    takes Nexium daily  . H/O hiatal hernia   . Headache(784.0)    all the time  . History of esophageal spasm   . History of IBS    no problem in past yr  . History of kidney stones   . History of shingles   . HTN (hypertension)    hx of  . Hyperlipidemia    takes Pravastatin daily  . Hypokalemia   . Hypothyroidism    takes Synthroid daily  . Impaired hearing   . Joint pain   . Joint swelling   . Neck pain    pinched   . Osteoarthritis   . Osteoporosis 09/08/2011  . PONV (postoperative nausea and vomiting)   . Syncope, non cardiac   . Weakness    numbness left arm    Past Surgical History:  Procedure  Laterality Date  . Arm Fx     left  . BREAST LUMPECTOMY     Left  . CARDIAC CATHETERIZATION     x 3-last one in the 90's per pt  . CATARACT EXTRACTION    . CATARACT EXTRACTION W/PHACO  10/02/2011   Procedure: CATARACT EXTRACTION PHACO AND INTRAOCULAR LENS PLACEMENT (IOC);  Surgeon: [name], MD;  Location: [facility];  Service: Ophthalmology;  Laterality: Right;  CDE:17.01  . CHOLECYSTECTOMY  1997   Status post -stones  . COLONOSCOPY  2012   Dr. Gala Romney: pancolonic diverticulosis, tubular adenoma removed from cecum. next TCS 2019  . COLONOSCOPY N/A 11/17/2016   Procedure: COLONOSCOPY;  Surgeon: [name], MD;  Location: [facility];  Service: Endoscopy;  Laterality: N/A;  1200 - moved to 3/16 @ 12:00  . EGD with dilitation     2004/2005 schatki's ring s/p dilation, wrap no longer intact on 2005 egd.  . ESOPHAGOGASTRODUODENOSCOPY N/A 11/17/2016   Procedure: ESOPHAGOGASTRODUODENOSCOPY (EGD);  Surgeon: [name], MD;  Location: [facility];  Service: Endoscopy;  Laterality: N/A;  . kidney stent  06/04/11   removed after about 3 weeks.  Marland Kitchen [place]  . KNEE SURGERY     bilateral  . LAPAROSCOPIC NISSEN FUNDOPLICATION  6503  . LAPAROSCOPIC PARTIAL  COLECTOMY N/A 03/09/2017   Procedure: LAPAROSCOPY, TAKEDOWN SPLENIC FLEXURE, LEFT COLECTOMY, PROCTOSCOPY;  Surgeon: [name], MD;  Location: [facility];  Service: General;  Laterality: N/A;  . LITHOTRIPSY  11/12  . MANDIBLE RECONSTRUCTION    . NECK SURGERY  2000   [facility]  . PARATHYROIDECTOMY  12/03/2013   DR TEOH  . PROCTOSCOPY  03/09/2017   Procedure: PROCTOSCOPY;  Surgeon: [name], MD;  Location: [facility];  Service: General;;  . SHOULDER SURGERY Left   . THYROIDECTOMY     Hypothyroidism status post  . THYROIDECTOMY N/A 12/03/2013   Procedure: PARATHYROIDECTOMY;  Surgeon: [name], MD;  Location: [place];  Service: ENT;  Laterality: N/A;  . TUBAL LIGATION    . WRIST SURGERY Left 2009    Current Outpatient  Prescriptions  Medication Sig Dispense Refill  . acetaminophen (TYLENOL) 650 MG CR tablet Take 650-1,300 mg by mouth every 8 (eight) hours as needed for pain.    Marland Kitchen amoxicillin-clavulanate (AUGMENTIN) 875-125 MG tablet Take 1 tablet by mouth 2 (two) times daily. One po bid x 7 days 14 tablet 0  . Calcium Citrate-Vitamin D (CALCIUM + D PO) Take 1 tablet by mouth daily.    Marland Kitchen levothyroxine (SYNTHROID, LEVOTHROID) 88 MCG tablet Take 88 mcg by mouth daily before breakfast.    . Multiple Vitamins-Minerals (CENTRUM SILVER ADULT 50+ PO) Take 1 capsule by mouth daily.    . pantoprazole (PROTONIX) 40 MG tablet Take 40 mg by mouth daily.    . pravastatin (PRAVACHOL) 20 MG tablet Take 20 mg by mouth every morning.     Marland Kitchen HYDROcodone-acetaminophen (NORCO/VICODIN) 5-325 MG tablet Take 1-2 tablets by mouth every 4 (four) hours as needed for moderate pain. (Patient not taking: Reported on 07/03/2017) 20 tablet 0   No current facility-administered medications for this visit.    Facility-Administered Medications Ordered in Other Visits  Medication Dose Route Frequency Provider Last Rate Last Dose  . 0.9 %  sodium chloride infusion   Intravenous Continuous Baird Cancer, PA-C 20 mL/hr at 11/15/16 1402    . 0.9 %  sodium chloride infusion   Intravenous Once [name], MD        Allergies as of 07/04/2017 - Review Complete 07/04/2017  Allergen Reaction Noted  . Metronidazole Other (See Comments) 09/12/2011  . Oxycodone Other (See Comments) 11/07/2010  . Ciprofloxacin Rash 11/07/2010  . Latex Itching and Rash 11/21/2013    Family History  Problem Relation Age of Onset  . Macular degeneration Sister   . Lung cancer Sister   . Pancreatic cancer Mother   . Heart attack Father   . Anesthesia problems Neg Hx   . Hypotension Neg Hx   . Pseudochol deficiency Neg Hx   . Malignant hyperthermia Neg Hx   . Colon cancer Neg Hx     Social History   Social History  . Marital status: Widowed    Spouse  name: N/A  . Number of children: N/A  . Years of education: N/A   Social History Main Topics  . Smoking status: Never Smoker  . Smokeless tobacco: Never Used  . Alcohol use No  . Drug use: No  . Sexual activity: No   Other Topics Concern  . None   Social History Narrative  . None    Review of Systems: Gen: Denies fever, chills, anorexia. Denies fatigue, weakness, weight loss.  CV: Denies chest pain, palpitations, syncope, peripheral edema, and claudication. Resp: Denies dyspnea at rest, cough,  wheezing, coughing up blood, and pleurisy. GI: see HPI  Derm: Denies rash, itching, dry skin Psych: Denies depression, anxiety, memory loss, confusion. No homicidal or suicidal ideation.  Heme: see HPI   Physical Exam: BP (!) 159/91   Pulse 69   Temp 97.6 F (36.4 C) (Oral)   Ht 5\' 6"  (1.676 m)   Wt 144 lb 6.4 oz (65.5 kg)   BMI 23.31 kg/m  General:   Alert and oriented. No distress noted. Pleasant and cooperative.  Head:  Normocephalic and atraumatic. Eyes:  Conjuctiva clear without scleral icterus. Mouth:  Oral mucosa pink and moist.  Abdomen:  +BS, soft, non-tender and non-distended. No rebound or guarding. No HSM or masses noted. Msk:  Symmetrical without gross deformities. Normal posture. Extremities:  Without edema. Neurologic:  Alert and  oriented x4 Psych:  Alert and cooperative. Normal mood and affect.  Lab Results  Component Value Date   WBC 7.0 07/03/2017   HGB 12.2 07/03/2017   HCT 36.0 07/03/2017   MCV 89.0 07/03/2017   PLT 210 07/03/2017

## 2017-07-04 NOTE — Telephone Encounter (Signed)
Can I use an URG spot? The next available is the end of December with all extenders.

## 2017-07-04 NOTE — Telephone Encounter (Signed)
Next available with extender. Thanks.

## 2017-07-04 NOTE — Telephone Encounter (Signed)
Noted  

## 2017-07-04 NOTE — Telephone Encounter (Signed)
Noted, routing message 

## 2017-07-04 NOTE — Telephone Encounter (Signed)
Pt was seen by [place] on 9/11 and went to the ER last night due to rectal bleeding. Pt's daughter said it was a lot of blood and was all over the bathroom floor. She said that the ER told her she had an infection and to get in with [place] this week. I told her we do not have anything until December and I would let the provider look over her ER notes from last night and see what they advise about getting patient OV with [place]. Please call [phone]

## 2017-07-04 NOTE — Telephone Encounter (Signed)
Spoke with pts daughter and pt was having some abdomen pain which pt thought it was from scar tissue. Pt began to have pain in her back with rectal bleeding. Pt went to the ER last night and a CT scan was done. Pt was given Amoxicillin 875-125mg  take 1 tab po bid x 7 days and asked to f/u with Dr. Gala Romney this week. When pt left the ER rectal bleeding had d/c. Please advise about appointment.

## 2017-07-05 DIAGNOSIS — M25511 Pain in right shoulder: Secondary | ICD-10-CM | POA: Diagnosis not present

## 2017-07-05 DIAGNOSIS — M25512 Pain in left shoulder: Secondary | ICD-10-CM | POA: Diagnosis not present

## 2017-07-05 DIAGNOSIS — Z6823 Body mass index (BMI) 23.0-23.9, adult: Secondary | ICD-10-CM | POA: Diagnosis not present

## 2017-07-06 NOTE — Assessment & Plan Note (Addendum)
82 year old female with recent bout of rectal bleeding and colitis noted on CT in ED, Hgb stable, hemodynamically stable, now with resolution of bleeding. Clinically seems to be most consistent with ischemic colitis. Complicated past medical history as noted in HPI. Would hold off on antibiotics for right now: if any diarrhea, obtain stool studies. Add probiotic. Call if any further rectal bleeding. Will review CT with radiologist in future to comment on mesenteric vasculature. Clinically, patient doing well. Discussed low fiber diet for next few days. Return for close follow-up in 6 weeks. As of note, chronic intermittent diarrhea at baseline and notes linked with dietary intake.

## 2017-07-10 NOTE — Progress Notes (Signed)
Cc'd to pcp 

## 2017-07-20 ENCOUNTER — Emergency Department (HOSPITAL_COMMUNITY)
Admission: EM | Admit: 2017-07-20 | Discharge: 2017-07-20 | Disposition: A | Discharge status: Home / Self Care | Payer: Medicare Other | Attending: Emergency Medicine | Admitting: Emergency Medicine

## 2017-07-20 ENCOUNTER — Encounter (HOSPITAL_COMMUNITY): Payer: Self-pay | Admitting: Emergency Medicine

## 2017-07-20 ENCOUNTER — Other Ambulatory Visit: Payer: Self-pay

## 2017-07-20 DIAGNOSIS — Y998 Other external cause status: Secondary | ICD-10-CM | POA: Diagnosis not present

## 2017-07-20 DIAGNOSIS — Y9389 Activity, other specified: Secondary | ICD-10-CM | POA: Diagnosis not present

## 2017-07-20 DIAGNOSIS — Z9049 Acquired absence of other specified parts of digestive tract: Secondary | ICD-10-CM | POA: Diagnosis not present

## 2017-07-20 DIAGNOSIS — I10 Essential (primary) hypertension: Secondary | ICD-10-CM | POA: Diagnosis not present

## 2017-07-20 DIAGNOSIS — S51812A Laceration without foreign body of left forearm, initial encounter: Secondary | ICD-10-CM | POA: Diagnosis present

## 2017-07-20 DIAGNOSIS — E039 Hypothyroidism, unspecified: Secondary | ICD-10-CM | POA: Insufficient documentation

## 2017-07-20 DIAGNOSIS — Z79899 Other long term (current) drug therapy: Secondary | ICD-10-CM | POA: Insufficient documentation

## 2017-07-20 DIAGNOSIS — Z853 Personal history of malignant neoplasm of breast: Secondary | ICD-10-CM | POA: Insufficient documentation

## 2017-07-20 DIAGNOSIS — Y92003 Bedroom of unspecified non-institutional (private) residence as the place of occurrence of the external cause: Secondary | ICD-10-CM | POA: Insufficient documentation

## 2017-07-20 DIAGNOSIS — W01198A Fall on same level from slipping, tripping and stumbling with subsequent striking against other object, initial encounter: Secondary | ICD-10-CM | POA: Insufficient documentation

## 2017-07-20 DIAGNOSIS — Z23 Encounter for immunization: Secondary | ICD-10-CM | POA: Insufficient documentation

## 2017-07-20 DIAGNOSIS — Z91040 Latex allergy status: Secondary | ICD-10-CM | POA: Diagnosis not present

## 2017-07-20 DIAGNOSIS — S56522A Laceration of other extensor muscle, fascia and tendon at forearm level, left arm, initial encounter: Secondary | ICD-10-CM | POA: Diagnosis not present

## 2017-07-20 MED ORDER — TETANUS-DIPHTH-ACELL PERTUSSIS 5-2.5-18.5 LF-MCG/0.5 IM SUSP: 0.5000 mL | Freq: Once | INTRAMUSCULAR | Status: DC

## 2017-07-20 MED ORDER — TETANUS-DIPHTH-ACELL PERTUSSIS 5-2.5-18.5 LF-MCG/0.5 IM SUSP
0.5000 mL | Freq: Once | INTRAMUSCULAR | Status: AC
  Administered 2017-07-20: 0.5 mL via INTRAMUSCULAR
  Filled 2017-07-20: qty 0.5

## 2017-07-20 NOTE — ED Triage Notes (Signed)
Pt states she was slipping and caught herself with her bed. Pt has skin tear to the left forearm.

## 2017-07-20 NOTE — Discharge Instructions (Signed)
See your doctor in 3 days for recheck Keep clean and dry for 3 days - they should start to come off in 1 week ER for increased pain, redness, fevers or pus from the wounds.

## 2017-07-20 NOTE — ED Notes (Signed)
Pt states that she lost her balance in her bedroom causing her to hit her left forearm against her bed, has skin tear noted to left forearm area, bleeding controlled, sure cleanse used to clean wound, Dr Sabra Heck at bedside

## 2017-07-20 NOTE — ED Notes (Signed)
ED Provider at bedside. 

## 2017-07-20 NOTE — ED Provider Notes (Addendum)
[facility] EMERGENCY DEPARTMENT Provider Note   CSN: [id number] Arrival date & time: 07/20/17  1928     History   Chief Complaint Chief Complaint  Patient presents with  . Laceration    HPI [name] is a 82 y.o. female.  HPI  Patient is an 82 year old female, she has a history of frequent falls due to imbalance and presents to the hospital with a laceration to the extensor aspect of the distal left forearm that occurred when she was at home trying to stand up lost her balance and struck the bedpost of her bed in her bedroom causing a skin tear to the arm.  There was minimal bleeding, minimal pain, no other injuries.  She has had this happen in the past as well.  She is unsure of her tetanus status.  This occurred just prior to arrival.  Past Medical History:  Diagnosis Date  . Breast cancer ([place]) 2003  . Diverticulitis    History  . Dyslexia   . GERD (gastroesophageal reflux disease)    takes Nexium daily  . H/O hiatal hernia   . Headache(784.0)    all the time  . History of esophageal spasm   . History of IBS    no problem in past yr  . History of kidney stones   . History of shingles   . HTN (hypertension)    hx of  . Hyperlipidemia    takes Pravastatin daily  . Hypokalemia   . Hypothyroidism    takes Synthroid daily  . Impaired hearing   . Joint pain   . Joint swelling   . Neck pain    pinched   . Osteoarthritis   . Osteoporosis 09/08/2011  . PONV (postoperative nausea and vomiting)   . Syncope, non cardiac   . Weakness    numbness left arm    Patient Active Problem List   Diagnosis Date Noted  . Rectal bleeding 07/04/2017  . H/O colectomy 03/09/2017  . Abnormal CT scan, colon 02/09/2017  . Abnormal weight loss 12/27/2016  . Left sided colitis ([place]) 12/27/2016  . IDA (iron deficiency anemia) 10/18/2016  . Heme positive stool 10/18/2016  . S/P parathyroidectomy ([place]) 12/03/2013  . Neck mass 10/30/2013  . Diverticulitis 10/26/2013  .  Generalized weakness 10/26/2013  . Diverticulitis with obstruction 10/26/2013  . Infiltrating lobular carcinoma, left breast 04/04/2013  . Syncope and collapse 02/21/2012  . Chest pain 02/21/2012  . Dehydration 02/21/2012  . Acute renal failure ([place]) 02/21/2012  . HTN (hypertension) 02/21/2012  . Hypothyroid 02/21/2012  . GERD (gastroesophageal reflux disease) 02/21/2012  . Osteoporosis 09/08/2011  . FULL INCONTINENCE OF FECES 11/07/2010  . DIARRHEA 11/07/2010    Past Surgical History:  Procedure Laterality Date  . Arm Fx     left  . BREAST LUMPECTOMY     Left  . CARDIAC CATHETERIZATION     x 3-last one in the 90's per pt  . CATARACT EXTRACTION    . CATARACT EXTRACTION PHACO AND INTRAOCULAR LENS PLACEMENT ([place]) Right 10/02/2011   Performed by [name], MD at [facility]  . CHOLECYSTECTOMY  1997   Status post -stones  . COLONOSCOPY  2012   Dr. Gala Romney: pancolonic diverticulosis, tubular adenoma removed from cecum. next TCS 2019  . COLONOSCOPY N/A 11/17/2016   Performed by [name], MD at [place]  . EGD with dilitation     2004/2005 schatki's ring s/p dilation, wrap no longer intact on 2005  egd.  Marland Kitchen ESOPHAGOGASTRODUODENOSCOPY (EGD) N/A 11/17/2016   Performed by [name], MD at [place]  . kidney stent  06/04/11   removed after about 3 weeks.  Marland Kitchen [place]  . KNEE SURGERY     bilateral  . LAPAROSCOPIC NISSEN FUNDOPLICATION  2536  . LAPAROSCOPY, TAKEDOWN SPLENIC FLEXURE, LEFT COLECTOMY, PROCTOSCOPY N/A 03/09/2017   Performed by [name], MD at [facility] ORS  . LITHOTRIPSY  11/12  . MANDIBLE RECONSTRUCTION    . NECK SURGERY  2000   [facility]  . PARATHYROIDECTOMY  12/03/2013   DR TEOH  . PARATHYROIDECTOMY N/A 12/03/2013   Performed by [name], MD at [place]  03/09/2017   Performed by [name], MD at [facility] ORS  . SHOULDER SURGERY Left   . THYROIDECTOMY     Hypothyroidism status post  . TUBAL LIGATION    . WRIST SURGERY Left  2009    OB History    No data available       Home Medications    Prior to Admission medications   Medication Sig Start Date End Date Taking? Authorizing Provider  acetaminophen (TYLENOL) 650 MG CR tablet Take 650-1,300 mg by mouth every 8 (eight) hours as needed for pain.   Yes [provider]  Calcium Citrate-Vitamin D (CALCIUM + D PO) Take 1 tablet by mouth daily.   Yes [provider]  levothyroxine (SYNTHROID, LEVOTHROID) 88 MCG tablet Take 88 mcg by mouth daily before breakfast.   Yes [provider]  Multiple Vitamins-Minerals (CENTRUM SILVER ADULT 50+ PO) Take 1 capsule by mouth daily.   Yes [provider]  pantoprazole (PROTONIX) 40 MG tablet Take 40 mg by mouth daily.   Yes [provider]  pravastatin (PRAVACHOL) 20 MG tablet Take 20 mg by mouth every morning.    Yes [provider]    Family History Family History  Problem Relation Age of Onset  . Macular degeneration Sister   . Lung cancer Sister   . Pancreatic cancer Mother   . Heart attack Father   . Anesthesia problems Neg Hx   . Hypotension Neg Hx   . Pseudochol deficiency Neg Hx   . Malignant hyperthermia Neg Hx   . Colon cancer Neg Hx     Social History Social History   Tobacco Use  . Smoking status: Never Smoker  . Smokeless tobacco: Never Used  Substance Use Topics  . Alcohol use: No  . Drug use: No     Allergies   Metronidazole; Oxycodone; Ciprofloxacin; and Latex   Review of Systems Review of Systems  Musculoskeletal: Negative for back pain and neck pain.  Skin: Positive for wound.  Neurological: Negative for headaches.     Physical Exam Updated Vital Signs BP (!) 113/94   Pulse 87   Temp 98.3 F (36.8 C)   Resp (!) 117   Ht 5' 6.5" (1.689 m)   Wt 65.3 kg (144 lb)   SpO2 97%   BMI 22.89 kg/m   Physical Exam  Constitutional: She appears well-developed and well-nourished. No distress.  HENT:  Head: Normocephalic.    Eyes: Conjunctivae are normal. No scleral icterus.  Cardiovascular: Normal rate and regular rhythm.  Pulmonary/Chest: Effort normal and breath sounds normal.  Musculoskeletal: Normal range of motion. She exhibits tenderness ( ttp over the laceration site ). She exhibits no edema.  Neurological: She is alert. Coordination normal.  Sensation and motor intact  Skin: Skin is warm and dry. She is not diaphoretic.  Laceration located on extensor distal left forearm The Laceration is linear shaped The depth is superficial including essentially the epidermis The length is 8 cm     ED Treatments / Results  Labs (all labs ordered are listed, but only abnormal results are displayed) Labs Reviewed - No data to display   Radiology No results found.  Procedures .Marland KitchenLaceration Repair Date/Time: 07/20/2017 9:02 PM Performed by: Noemi Chapel, MD Authorized by: Noemi Chapel, MD   Consent:    Consent obtained:  Verbal   Consent given by:  Patient   Risks discussed:  Pain, poor cosmetic result, need for additional repair and poor wound healing   Alternatives discussed:  No treatment and delayed treatment Anesthesia (see MAR for exact dosages):    Anesthesia method:  None Laceration details:    Location:  Shoulder/arm   Shoulder/arm location:  L lower arm   Length (cm):  8   Depth (mm):  1 Repair type:    Repair type:  Simple Pre-procedure details:    Preparation:  Patient was prepped and draped in usual sterile fashion Exploration:    Wound exploration: wound explored through full range of motion and entire depth of wound probed and visualized     Wound extent: no muscle damage noted, no nerve damage noted, no tendon damage noted and no vascular damage noted     Contaminated: no   Treatment:    Area cleansed with:  Saline   Amount of cleaning:  Standard   Irrigation solution:  Tap water   Irrigation volume:  5 minutes of irrigation   Visualized foreign bodies/material removed: no    Skin repair:    Repair method:  Steri-Strips   Number of Steri-Strips:  4 (wide steri's) Approximation:    Approximation:  Close   Vermilion border: well-aligned   Post-procedure details:    Dressing:  Adhesive bandage and non-adherent dressing   Patient tolerance of procedure:  Tolerated with difficulty   (including critical care time)  Medications Ordered in ED Medications  Tdap (BOOSTRIX) injection 0.5 mL (0.5 mLs Intramuscular Given 07/20/17 2003)     Initial Impression / Assessment and Plan / ED Course  I have reviewed the triage vital signs and the nursing notes.  Pertinent labs & imaging results that were available during my care of the patient were reviewed by me and considered in my medical decision making (see chart for details).     Laceration is more of a skin tear, this will require closure with Steri-Strips after the wound has been cleaned, the patient is in agreement, tetanus updated, patient stable for discharge, no antibiotics indicated.  Final Clinical Impressions(s) / ED Diagnoses   Final diagnoses:  Laceration of extensor tendon of left forearm, initial encounter    ED Discharge Orders    None       Noemi Chapel, MD 07/20/17 2104    Noemi Chapel, MD 07/20/17 2105

## 2017-07-20 NOTE — ED Notes (Signed)
Dressing applied to left forearm, pt tolerated well,

## 2017-07-23 DIAGNOSIS — Z6823 Body mass index (BMI) 23.0-23.9, adult: Secondary | ICD-10-CM | POA: Diagnosis not present

## 2017-07-23 DIAGNOSIS — M25512 Pain in left shoulder: Secondary | ICD-10-CM | POA: Diagnosis not present

## 2017-07-23 DIAGNOSIS — S41112A Laceration without foreign body of left upper arm, initial encounter: Secondary | ICD-10-CM | POA: Diagnosis not present

## 2017-07-23 DIAGNOSIS — M810 Age-related osteoporosis without current pathological fracture: Secondary | ICD-10-CM | POA: Diagnosis not present

## 2017-08-10 DIAGNOSIS — I10 Essential (primary) hypertension: Secondary | ICD-10-CM | POA: Diagnosis not present

## 2017-08-10 DIAGNOSIS — K210 Gastro-esophageal reflux disease with esophagitis: Secondary | ICD-10-CM | POA: Diagnosis not present

## 2017-08-10 DIAGNOSIS — G301 Alzheimer's disease with late onset: Secondary | ICD-10-CM | POA: Diagnosis not present

## 2017-08-10 DIAGNOSIS — Z9189 Other specified personal risk factors, not elsewhere classified: Secondary | ICD-10-CM | POA: Diagnosis not present

## 2017-08-10 DIAGNOSIS — E039 Hypothyroidism, unspecified: Secondary | ICD-10-CM | POA: Diagnosis not present

## 2017-08-10 DIAGNOSIS — D649 Anemia, unspecified: Secondary | ICD-10-CM | POA: Diagnosis not present

## 2017-08-10 DIAGNOSIS — E782 Mixed hyperlipidemia: Secondary | ICD-10-CM | POA: Diagnosis not present

## 2017-08-10 DIAGNOSIS — N183 Chronic kidney disease, stage 3 (moderate): Secondary | ICD-10-CM | POA: Diagnosis not present

## 2017-08-17 DIAGNOSIS — G301 Alzheimer's disease with late onset: Secondary | ICD-10-CM | POA: Diagnosis not present

## 2017-08-17 DIAGNOSIS — E782 Mixed hyperlipidemia: Secondary | ICD-10-CM | POA: Diagnosis not present

## 2017-08-17 DIAGNOSIS — E039 Hypothyroidism, unspecified: Secondary | ICD-10-CM | POA: Diagnosis not present

## 2017-08-17 DIAGNOSIS — M810 Age-related osteoporosis without current pathological fracture: Secondary | ICD-10-CM | POA: Diagnosis not present

## 2017-08-17 DIAGNOSIS — K219 Gastro-esophageal reflux disease without esophagitis: Secondary | ICD-10-CM | POA: Diagnosis not present

## 2017-08-17 DIAGNOSIS — F331 Major depressive disorder, recurrent, moderate: Secondary | ICD-10-CM | POA: Diagnosis not present

## 2017-08-17 DIAGNOSIS — Z6824 Body mass index (BMI) 24.0-24.9, adult: Secondary | ICD-10-CM | POA: Diagnosis not present

## 2017-08-17 DIAGNOSIS — I10 Essential (primary) hypertension: Secondary | ICD-10-CM | POA: Diagnosis not present

## 2017-08-24 ENCOUNTER — Ambulatory Visit: Payer: Medicare Other | Admitting: Gastroenterology

## 2017-09-10 DIAGNOSIS — Z6824 Body mass index (BMI) 24.0-24.9, adult: Secondary | ICD-10-CM | POA: Diagnosis not present

## 2017-09-10 DIAGNOSIS — M1711 Unilateral primary osteoarthritis, right knee: Secondary | ICD-10-CM | POA: Diagnosis not present

## 2017-09-10 DIAGNOSIS — M7551 Bursitis of right shoulder: Secondary | ICD-10-CM | POA: Diagnosis not present

## 2017-09-11 ENCOUNTER — Encounter: Payer: Self-pay | Admitting: Internal Medicine

## 2017-09-13 ENCOUNTER — Telehealth: Payer: Self-pay | Admitting: Gastroenterology

## 2017-09-13 NOTE — Telephone Encounter (Signed)
Spoke with pt, pt is going to have her daughter call.

## 2017-09-13 NOTE — Telephone Encounter (Signed)
Was following up regarding patient: I believe she was to be seen in December but cancelled. I wanted to make sure she was doing well and had close follow-up here. Can we follow-up with her and see if she is able to come in routinely? Didn't want her lost to follow-up.     Her prior CT had been reviewed with radiologist as per plan. She does have atherosclerotic disease as expected, with celiac likely less than 50%, unable to clearly comment on IMA, SMA with possible greater than 50% stenosis. She does not have signs of chronic mesenteric ischemia, and her weight has increased over past few months. If this is a concern in follow-up visits, would recommend CTA.

## 2017-09-24 NOTE — Telephone Encounter (Signed)
FYI Spoke with pt and she said her daughter called the office and spoke with someone. Pt states that she doesn't need a f/u. If needed, pt will call back .

## 2017-10-03 DIAGNOSIS — Z6824 Body mass index (BMI) 24.0-24.9, adult: Secondary | ICD-10-CM | POA: Diagnosis not present

## 2017-10-03 DIAGNOSIS — M1711 Unilateral primary osteoarthritis, right knee: Secondary | ICD-10-CM | POA: Diagnosis not present

## 2017-10-03 DIAGNOSIS — M1712 Unilateral primary osteoarthritis, left knee: Secondary | ICD-10-CM | POA: Diagnosis not present

## 2017-10-03 DIAGNOSIS — M19012 Primary osteoarthritis, left shoulder: Secondary | ICD-10-CM | POA: Diagnosis not present

## 2017-10-31 ENCOUNTER — Ambulatory Visit (HOSPITAL_COMMUNITY): Payer: Medicare Other

## 2017-11-16 DIAGNOSIS — M1711 Unilateral primary osteoarthritis, right knee: Secondary | ICD-10-CM | POA: Diagnosis not present

## 2017-12-17 DIAGNOSIS — Z961 Presence of intraocular lens: Secondary | ICD-10-CM | POA: Diagnosis not present

## 2017-12-17 DIAGNOSIS — H538 Other visual disturbances: Secondary | ICD-10-CM | POA: Diagnosis not present

## 2017-12-17 DIAGNOSIS — H401131 Primary open-angle glaucoma, bilateral, mild stage: Secondary | ICD-10-CM | POA: Diagnosis not present

## 2017-12-29 DIAGNOSIS — M722 Plantar fascial fibromatosis: Secondary | ICD-10-CM | POA: Diagnosis not present

## 2017-12-29 DIAGNOSIS — Z6825 Body mass index (BMI) 25.0-25.9, adult: Secondary | ICD-10-CM | POA: Diagnosis not present

## 2018-01-10 DIAGNOSIS — Z6825 Body mass index (BMI) 25.0-25.9, adult: Secondary | ICD-10-CM | POA: Diagnosis not present

## 2018-01-10 DIAGNOSIS — M722 Plantar fascial fibromatosis: Secondary | ICD-10-CM | POA: Diagnosis not present

## 2018-01-10 DIAGNOSIS — K439 Ventral hernia without obstruction or gangrene: Secondary | ICD-10-CM | POA: Diagnosis not present

## 2018-01-14 DIAGNOSIS — S8002XA Contusion of left knee, initial encounter: Secondary | ICD-10-CM | POA: Diagnosis not present

## 2018-01-14 DIAGNOSIS — S46001A Unspecified injury of muscle(s) and tendon(s) of the rotator cuff of right shoulder, initial encounter: Secondary | ICD-10-CM | POA: Diagnosis not present

## 2018-01-14 DIAGNOSIS — R9431 Abnormal electrocardiogram [ECG] [EKG]: Secondary | ICD-10-CM | POA: Diagnosis not present

## 2018-01-29 DIAGNOSIS — S40012A Contusion of left shoulder, initial encounter: Secondary | ICD-10-CM | POA: Diagnosis not present

## 2018-01-29 DIAGNOSIS — I493 Ventricular premature depolarization: Secondary | ICD-10-CM | POA: Diagnosis not present

## 2018-01-29 DIAGNOSIS — Z6825 Body mass index (BMI) 25.0-25.9, adult: Secondary | ICD-10-CM | POA: Diagnosis not present

## 2018-01-29 DIAGNOSIS — S8002XA Contusion of left knee, initial encounter: Secondary | ICD-10-CM | POA: Diagnosis not present

## 2018-02-05 DIAGNOSIS — Z8719 Personal history of other diseases of the digestive system: Secondary | ICD-10-CM | POA: Diagnosis not present

## 2018-02-05 DIAGNOSIS — Z853 Personal history of malignant neoplasm of breast: Secondary | ICD-10-CM | POA: Diagnosis not present

## 2018-02-05 DIAGNOSIS — Z9889 Other specified postprocedural states: Secondary | ICD-10-CM | POA: Diagnosis not present

## 2018-02-05 DIAGNOSIS — K432 Incisional hernia without obstruction or gangrene: Secondary | ICD-10-CM | POA: Diagnosis not present

## 2018-02-05 DIAGNOSIS — R413 Other amnesia: Secondary | ICD-10-CM | POA: Diagnosis not present

## 2018-02-11 ENCOUNTER — Other Ambulatory Visit: Payer: Self-pay | Admitting: General Surgery

## 2018-02-11 DIAGNOSIS — Z8719 Personal history of other diseases of the digestive system: Secondary | ICD-10-CM

## 2018-02-11 DIAGNOSIS — Z9889 Other specified postprocedural states: Principal | ICD-10-CM

## 2018-02-12 DIAGNOSIS — M6281 Muscle weakness (generalized): Secondary | ICD-10-CM | POA: Diagnosis not present

## 2018-02-12 DIAGNOSIS — M25611 Stiffness of right shoulder, not elsewhere classified: Secondary | ICD-10-CM | POA: Diagnosis not present

## 2018-02-12 DIAGNOSIS — M25612 Stiffness of left shoulder, not elsewhere classified: Secondary | ICD-10-CM | POA: Diagnosis not present

## 2018-02-12 DIAGNOSIS — M25511 Pain in right shoulder: Secondary | ICD-10-CM | POA: Diagnosis not present

## 2018-02-13 DIAGNOSIS — M25612 Stiffness of left shoulder, not elsewhere classified: Secondary | ICD-10-CM | POA: Diagnosis not present

## 2018-02-13 DIAGNOSIS — M25611 Stiffness of right shoulder, not elsewhere classified: Secondary | ICD-10-CM | POA: Diagnosis not present

## 2018-02-13 DIAGNOSIS — M6281 Muscle weakness (generalized): Secondary | ICD-10-CM | POA: Diagnosis not present

## 2018-02-13 DIAGNOSIS — M25511 Pain in right shoulder: Secondary | ICD-10-CM | POA: Diagnosis not present

## 2018-02-14 DIAGNOSIS — M25611 Stiffness of right shoulder, not elsewhere classified: Secondary | ICD-10-CM | POA: Diagnosis not present

## 2018-02-14 DIAGNOSIS — M25612 Stiffness of left shoulder, not elsewhere classified: Secondary | ICD-10-CM | POA: Diagnosis not present

## 2018-02-14 DIAGNOSIS — M25511 Pain in right shoulder: Secondary | ICD-10-CM | POA: Diagnosis not present

## 2018-02-14 DIAGNOSIS — M6281 Muscle weakness (generalized): Secondary | ICD-10-CM | POA: Diagnosis not present

## 2018-02-18 DIAGNOSIS — M25611 Stiffness of right shoulder, not elsewhere classified: Secondary | ICD-10-CM | POA: Diagnosis not present

## 2018-02-18 DIAGNOSIS — M25511 Pain in right shoulder: Secondary | ICD-10-CM | POA: Diagnosis not present

## 2018-02-18 DIAGNOSIS — M25612 Stiffness of left shoulder, not elsewhere classified: Secondary | ICD-10-CM | POA: Diagnosis not present

## 2018-02-18 DIAGNOSIS — M6281 Muscle weakness (generalized): Secondary | ICD-10-CM | POA: Diagnosis not present

## 2018-02-19 DIAGNOSIS — M25612 Stiffness of left shoulder, not elsewhere classified: Secondary | ICD-10-CM | POA: Diagnosis not present

## 2018-02-19 DIAGNOSIS — M25511 Pain in right shoulder: Secondary | ICD-10-CM | POA: Diagnosis not present

## 2018-02-19 DIAGNOSIS — M6281 Muscle weakness (generalized): Secondary | ICD-10-CM | POA: Diagnosis not present

## 2018-02-19 DIAGNOSIS — M25611 Stiffness of right shoulder, not elsewhere classified: Secondary | ICD-10-CM | POA: Diagnosis not present

## 2018-02-22 ENCOUNTER — Ambulatory Visit
Admission: RE | Admit: 2018-02-22 | Discharge: 2018-02-22 | Disposition: A | Discharge status: Home / Self Care | Payer: Medicare Other | Source: Ambulatory Visit | Attending: General Surgery | Admitting: General Surgery

## 2018-02-22 DIAGNOSIS — Z9889 Other specified postprocedural states: Principal | ICD-10-CM

## 2018-02-22 DIAGNOSIS — Z8719 Personal history of other diseases of the digestive system: Secondary | ICD-10-CM

## 2018-02-22 DIAGNOSIS — K5730 Diverticulosis of large intestine without perforation or abscess without bleeding: Secondary | ICD-10-CM | POA: Diagnosis not present

## 2018-02-22 MED ORDER — IOPAMIDOL (ISOVUE-300) INJECTION 61%
100.0000 mL | Freq: Once | INTRAVENOUS | Status: AC | PRN
  Administered 2018-02-22: 100 mL via INTRAVENOUS

## 2018-02-25 DIAGNOSIS — M6281 Muscle weakness (generalized): Secondary | ICD-10-CM | POA: Diagnosis not present

## 2018-02-25 DIAGNOSIS — M25611 Stiffness of right shoulder, not elsewhere classified: Secondary | ICD-10-CM | POA: Diagnosis not present

## 2018-02-25 DIAGNOSIS — M25612 Stiffness of left shoulder, not elsewhere classified: Secondary | ICD-10-CM | POA: Diagnosis not present

## 2018-02-25 DIAGNOSIS — M25511 Pain in right shoulder: Secondary | ICD-10-CM | POA: Diagnosis not present

## 2018-02-26 DIAGNOSIS — M6281 Muscle weakness (generalized): Secondary | ICD-10-CM | POA: Diagnosis not present

## 2018-02-26 DIAGNOSIS — M25611 Stiffness of right shoulder, not elsewhere classified: Secondary | ICD-10-CM | POA: Diagnosis not present

## 2018-02-26 DIAGNOSIS — M25612 Stiffness of left shoulder, not elsewhere classified: Secondary | ICD-10-CM | POA: Diagnosis not present

## 2018-02-26 DIAGNOSIS — M25511 Pain in right shoulder: Secondary | ICD-10-CM | POA: Diagnosis not present

## 2018-02-28 DIAGNOSIS — M25612 Stiffness of left shoulder, not elsewhere classified: Secondary | ICD-10-CM | POA: Diagnosis not present

## 2018-02-28 DIAGNOSIS — M25611 Stiffness of right shoulder, not elsewhere classified: Secondary | ICD-10-CM | POA: Diagnosis not present

## 2018-02-28 DIAGNOSIS — M25511 Pain in right shoulder: Secondary | ICD-10-CM | POA: Diagnosis not present

## 2018-02-28 DIAGNOSIS — M6281 Muscle weakness (generalized): Secondary | ICD-10-CM | POA: Diagnosis not present

## 2018-03-04 DIAGNOSIS — M25611 Stiffness of right shoulder, not elsewhere classified: Secondary | ICD-10-CM | POA: Diagnosis not present

## 2018-03-04 DIAGNOSIS — M25511 Pain in right shoulder: Secondary | ICD-10-CM | POA: Diagnosis not present

## 2018-03-04 DIAGNOSIS — M25612 Stiffness of left shoulder, not elsewhere classified: Secondary | ICD-10-CM | POA: Diagnosis not present

## 2018-03-04 DIAGNOSIS — M6281 Muscle weakness (generalized): Secondary | ICD-10-CM | POA: Diagnosis not present

## 2018-03-05 DIAGNOSIS — M25612 Stiffness of left shoulder, not elsewhere classified: Secondary | ICD-10-CM | POA: Diagnosis not present

## 2018-03-05 DIAGNOSIS — M25611 Stiffness of right shoulder, not elsewhere classified: Secondary | ICD-10-CM | POA: Diagnosis not present

## 2018-03-05 DIAGNOSIS — M6281 Muscle weakness (generalized): Secondary | ICD-10-CM | POA: Diagnosis not present

## 2018-03-05 DIAGNOSIS — M25511 Pain in right shoulder: Secondary | ICD-10-CM | POA: Diagnosis not present

## 2018-03-08 DIAGNOSIS — M25612 Stiffness of left shoulder, not elsewhere classified: Secondary | ICD-10-CM | POA: Diagnosis not present

## 2018-03-08 DIAGNOSIS — M25611 Stiffness of right shoulder, not elsewhere classified: Secondary | ICD-10-CM | POA: Diagnosis not present

## 2018-03-08 DIAGNOSIS — M6281 Muscle weakness (generalized): Secondary | ICD-10-CM | POA: Diagnosis not present

## 2018-03-08 DIAGNOSIS — M25511 Pain in right shoulder: Secondary | ICD-10-CM | POA: Diagnosis not present

## 2018-03-11 DIAGNOSIS — M6281 Muscle weakness (generalized): Secondary | ICD-10-CM | POA: Diagnosis not present

## 2018-03-11 DIAGNOSIS — M25611 Stiffness of right shoulder, not elsewhere classified: Secondary | ICD-10-CM | POA: Diagnosis not present

## 2018-03-11 DIAGNOSIS — Z9189 Other specified personal risk factors, not elsewhere classified: Secondary | ICD-10-CM | POA: Diagnosis not present

## 2018-03-11 DIAGNOSIS — G301 Alzheimer's disease with late onset: Secondary | ICD-10-CM | POA: Diagnosis not present

## 2018-03-11 DIAGNOSIS — D649 Anemia, unspecified: Secondary | ICD-10-CM | POA: Diagnosis not present

## 2018-03-11 DIAGNOSIS — M25511 Pain in right shoulder: Secondary | ICD-10-CM | POA: Diagnosis not present

## 2018-03-11 DIAGNOSIS — F331 Major depressive disorder, recurrent, moderate: Secondary | ICD-10-CM | POA: Diagnosis not present

## 2018-03-11 DIAGNOSIS — E782 Mixed hyperlipidemia: Secondary | ICD-10-CM | POA: Diagnosis not present

## 2018-03-11 DIAGNOSIS — I10 Essential (primary) hypertension: Secondary | ICD-10-CM | POA: Diagnosis not present

## 2018-03-11 DIAGNOSIS — M25612 Stiffness of left shoulder, not elsewhere classified: Secondary | ICD-10-CM | POA: Diagnosis not present

## 2018-03-11 DIAGNOSIS — K210 Gastro-esophageal reflux disease with esophagitis: Secondary | ICD-10-CM | POA: Diagnosis not present

## 2018-03-14 DIAGNOSIS — M25511 Pain in right shoulder: Secondary | ICD-10-CM | POA: Diagnosis not present

## 2018-03-14 DIAGNOSIS — M25612 Stiffness of left shoulder, not elsewhere classified: Secondary | ICD-10-CM | POA: Diagnosis not present

## 2018-03-14 DIAGNOSIS — M25611 Stiffness of right shoulder, not elsewhere classified: Secondary | ICD-10-CM | POA: Diagnosis not present

## 2018-03-14 DIAGNOSIS — M6281 Muscle weakness (generalized): Secondary | ICD-10-CM | POA: Diagnosis not present

## 2018-03-15 DIAGNOSIS — G301 Alzheimer's disease with late onset: Secondary | ICD-10-CM | POA: Diagnosis not present

## 2018-03-15 DIAGNOSIS — E039 Hypothyroidism, unspecified: Secondary | ICD-10-CM | POA: Diagnosis not present

## 2018-03-15 DIAGNOSIS — M810 Age-related osteoporosis without current pathological fracture: Secondary | ICD-10-CM | POA: Diagnosis not present

## 2018-03-15 DIAGNOSIS — E782 Mixed hyperlipidemia: Secondary | ICD-10-CM | POA: Diagnosis not present

## 2018-03-15 DIAGNOSIS — I10 Essential (primary) hypertension: Secondary | ICD-10-CM | POA: Diagnosis not present

## 2018-03-15 DIAGNOSIS — Z6824 Body mass index (BMI) 24.0-24.9, adult: Secondary | ICD-10-CM | POA: Diagnosis not present

## 2018-03-15 DIAGNOSIS — K432 Incisional hernia without obstruction or gangrene: Secondary | ICD-10-CM | POA: Diagnosis not present

## 2018-03-15 DIAGNOSIS — F331 Major depressive disorder, recurrent, moderate: Secondary | ICD-10-CM | POA: Diagnosis not present

## 2018-03-18 DIAGNOSIS — M25611 Stiffness of right shoulder, not elsewhere classified: Secondary | ICD-10-CM | POA: Diagnosis not present

## 2018-03-18 DIAGNOSIS — M25612 Stiffness of left shoulder, not elsewhere classified: Secondary | ICD-10-CM | POA: Diagnosis not present

## 2018-03-18 DIAGNOSIS — M6281 Muscle weakness (generalized): Secondary | ICD-10-CM | POA: Diagnosis not present

## 2018-03-18 DIAGNOSIS — M25511 Pain in right shoulder: Secondary | ICD-10-CM | POA: Diagnosis not present

## 2018-03-20 DIAGNOSIS — M25611 Stiffness of right shoulder, not elsewhere classified: Secondary | ICD-10-CM | POA: Diagnosis not present

## 2018-03-20 DIAGNOSIS — M6281 Muscle weakness (generalized): Secondary | ICD-10-CM | POA: Diagnosis not present

## 2018-03-20 DIAGNOSIS — M25612 Stiffness of left shoulder, not elsewhere classified: Secondary | ICD-10-CM | POA: Diagnosis not present

## 2018-03-20 DIAGNOSIS — M25511 Pain in right shoulder: Secondary | ICD-10-CM | POA: Diagnosis not present

## 2018-03-22 ENCOUNTER — Other Ambulatory Visit: Payer: Self-pay | Admitting: General Surgery

## 2018-03-22 DIAGNOSIS — K432 Incisional hernia without obstruction or gangrene: Secondary | ICD-10-CM | POA: Diagnosis not present

## 2018-03-22 DIAGNOSIS — H401131 Primary open-angle glaucoma, bilateral, mild stage: Secondary | ICD-10-CM | POA: Diagnosis not present

## 2018-03-22 DIAGNOSIS — R413 Other amnesia: Secondary | ICD-10-CM | POA: Diagnosis not present

## 2018-03-22 DIAGNOSIS — N189 Chronic kidney disease, unspecified: Secondary | ICD-10-CM | POA: Diagnosis not present

## 2018-03-22 DIAGNOSIS — Z8719 Personal history of other diseases of the digestive system: Secondary | ICD-10-CM | POA: Diagnosis not present

## 2018-03-22 DIAGNOSIS — Z9889 Other specified postprocedural states: Secondary | ICD-10-CM | POA: Diagnosis not present

## 2018-03-22 DIAGNOSIS — Z853 Personal history of malignant neoplasm of breast: Secondary | ICD-10-CM | POA: Diagnosis not present

## 2018-03-26 DIAGNOSIS — M25611 Stiffness of right shoulder, not elsewhere classified: Secondary | ICD-10-CM | POA: Diagnosis not present

## 2018-03-26 DIAGNOSIS — M25612 Stiffness of left shoulder, not elsewhere classified: Secondary | ICD-10-CM | POA: Diagnosis not present

## 2018-03-26 DIAGNOSIS — M6281 Muscle weakness (generalized): Secondary | ICD-10-CM | POA: Diagnosis not present

## 2018-03-26 DIAGNOSIS — M25511 Pain in right shoulder: Secondary | ICD-10-CM | POA: Diagnosis not present

## 2018-03-28 DIAGNOSIS — M6281 Muscle weakness (generalized): Secondary | ICD-10-CM | POA: Diagnosis not present

## 2018-03-28 DIAGNOSIS — M25611 Stiffness of right shoulder, not elsewhere classified: Secondary | ICD-10-CM | POA: Diagnosis not present

## 2018-03-28 DIAGNOSIS — M25612 Stiffness of left shoulder, not elsewhere classified: Secondary | ICD-10-CM | POA: Diagnosis not present

## 2018-03-28 DIAGNOSIS — M25511 Pain in right shoulder: Secondary | ICD-10-CM | POA: Diagnosis not present

## 2018-04-03 DIAGNOSIS — M25612 Stiffness of left shoulder, not elsewhere classified: Secondary | ICD-10-CM | POA: Diagnosis not present

## 2018-04-03 DIAGNOSIS — M25511 Pain in right shoulder: Secondary | ICD-10-CM | POA: Diagnosis not present

## 2018-04-03 DIAGNOSIS — M25611 Stiffness of right shoulder, not elsewhere classified: Secondary | ICD-10-CM | POA: Diagnosis not present

## 2018-04-03 DIAGNOSIS — M6281 Muscle weakness (generalized): Secondary | ICD-10-CM | POA: Diagnosis not present

## 2018-04-04 ENCOUNTER — Other Ambulatory Visit: Payer: Self-pay

## 2018-04-08 ENCOUNTER — Telehealth: Payer: Self-pay | Admitting: Orthopedic Surgery

## 2018-04-08 NOTE — Telephone Encounter (Signed)
Spoke with patient's daughter, designated contact, Fara Olden [phone]. Asked about appointment with Dr Aline Brochure as soon as possible; relayed he is out of office. Awaiting call back if needing to schedule.

## 2018-04-11 NOTE — Pre-Procedure Instructions (Signed)
IDA MILBRATH  04/11/2018      [facility] - [place]ia Buff, [place] [address] [place] [address] Phone: [phone] Fax: [place], [place] [address] Phone: [phone] Fax: [phone]    Your procedure is scheduled on Mon., Aug. 19, 2019 from 1:30PM-4:30PM  Report to [facility] Admitting Entrance "A" at 11:30AM  Call this number if you have problems the morning of surgery:  [phone]   Remember:  Do not eat after midnight on Aug. 18th  You may drink clear liquids until 3 hours (10:30AM) prior to surgery .  Clear liquids allowed are: Water and Gatorade    Take these medicines the morning of surgery with A SIP OF WATER: Levothyroxine (SYNTHROID, LEVOTHROID), Pantoprazole (PROTONIX), and Pravastatin (PRAVACHOL)  If needed Acetaminophen (TYLENOL)    7 days before surgery (8/12), stop taking all Other Aspirin Products, Vitamins, Fish oils, and Herbal medications. Also stop all NSAIDS i.e. Advil, Ibuprofen, Motrin, Aleve, Anaprox, Naproxen, BC, Goody Powders, and all Supplements.   Do not wear jewelry, make-up or nail polish.  Do not wear lotions, powders, or perfumes, or deodorant.  Do not shave 48 hours prior to surgery.   Do not bring valuables to the hospital.  [facility] is not responsible for any belongings or valuables.  Contacts, dentures or bridgework may not be worn into surgery.  Leave your suitcase in the car.  After surgery it may be brought to your room.  For patients admitted to the hospital, discharge time will be determined by your treatment team.  Patients discharged the day of surgery will not be allowed to drive home.   Special instructions:   [place]- Preparing For Surgery  Before surgery, you can play an important role. Because skin is not sterile, your skin needs to be as free of germs as possible. You  can reduce the number of germs on your skin by washing with CHG (chlorahexidine gluconate) Soap before surgery.  CHG is an antiseptic cleaner which kills germs and bonds with the skin to continue killing germs even after washing.    Oral Hygiene is also important to reduce your risk of infection.  Remember - BRUSH YOUR TEETH THE MORNING OF SURGERY WITH YOUR REGULAR TOOTHPASTE  Please do not use if you have an allergy to CHG or antibacterial soaps. If your skin becomes reddened/irritated stop using the CHG.  Do not shave (including legs and underarms) for at least 48 hours prior to first CHG shower. It is OK to shave your face.  Please follow these instructions carefully.   1. Shower the NIGHT BEFORE SURGERY and the MORNING OF SURGERY with CHG.   2. If you chose to wash your hair, wash your hair first as usual with your normal shampoo.  3. After you shampoo, rinse your hair and body thoroughly to remove the shampoo.  4. Use CHG as you would any other liquid soap. You can apply CHG directly to the skin and wash gently with a scrungie or a clean washcloth.   5. Apply the CHG Soap to your body ONLY FROM THE NECK DOWN.  Do not use on open wounds or open sores. Avoid contact with your eyes, ears, mouth and genitals (private parts). Wash Face and genitals (private parts)  with your normal soap.  6. Wash thoroughly, paying special attention to the  area where your surgery will be performed.  7. Thoroughly rinse your body with warm water from the neck down.  8. DO NOT shower/wash with your normal soap after using and rinsing off the CHG Soap.  9. Pat yourself dry with a CLEAN TOWEL.  10. Wear CLEAN PAJAMAS to bed the night before surgery, wear comfortable clothes the morning of surgery  11. Place CLEAN SHEETS on your bed the night of your first shower and DO NOT SLEEP WITH PETS.  Day of Surgery:  Do not apply any deodorants/lotions.  Please wear clean clothes to the hospital/surgery center.    Remember to brush your teeth WITH YOUR REGULAR TOOTHPASTE.  Please read over the following fact sheets that you were given. Pain Booklet, Coughing and Deep Breathing and Surgical Site Infection Prevention

## 2018-04-12 ENCOUNTER — Encounter (HOSPITAL_COMMUNITY)
Admission: RE | Admit: 2018-04-12 | Discharge: 2018-04-12 | Disposition: A | Discharge status: Home / Self Care | Payer: Medicare Other | Source: Ambulatory Visit | Attending: General Surgery | Admitting: General Surgery

## 2018-04-12 ENCOUNTER — Encounter (HOSPITAL_COMMUNITY): Payer: Self-pay

## 2018-04-12 ENCOUNTER — Other Ambulatory Visit: Payer: Self-pay

## 2018-04-12 DIAGNOSIS — Z01812 Encounter for preprocedural laboratory examination: Secondary | ICD-10-CM | POA: Insufficient documentation

## 2018-04-12 DIAGNOSIS — M25572 Pain in left ankle and joints of left foot: Secondary | ICD-10-CM | POA: Diagnosis not present

## 2018-04-12 DIAGNOSIS — R9431 Abnormal electrocardiogram [ECG] [EKG]: Secondary | ICD-10-CM | POA: Insufficient documentation

## 2018-04-12 DIAGNOSIS — Z01810 Encounter for preprocedural cardiovascular examination: Secondary | ICD-10-CM | POA: Insufficient documentation

## 2018-04-12 HISTORY — DX: Incisional hernia without obstruction or gangrene: K43.2

## 2018-04-12 HISTORY — DX: Cardiac murmur, unspecified: R01.1

## 2018-04-12 LAB — COMPREHENSIVE METABOLIC PANEL
ALT: 12 U/L (ref 0–44)
AST: 21 U/L (ref 15–41)
Albumin: 3.7 g/dL (ref 3.5–5.0)
Alkaline Phosphatase: 48 U/L (ref 38–126)
Anion gap: 9 (ref 5–15)
BUN: 12 mg/dL (ref 8–23)
CALCIUM: 9 mg/dL (ref 8.9–10.3)
CHLORIDE: 108 mmol/L (ref 98–111)
CO2: 27 mmol/L (ref 22–32)
CREATININE: 0.8 mg/dL (ref 0.44–1.00)
Glucose, Bld: 86 mg/dL (ref 70–99)
Potassium: 4 mmol/L (ref 3.5–5.1)
Sodium: 144 mmol/L (ref 135–145)
Total Bilirubin: 0.5 mg/dL (ref 0.3–1.2)
Total Protein: 7 g/dL (ref 6.5–8.1)

## 2018-04-12 LAB — CBC WITH DIFFERENTIAL/PLATELET
ABS IMMATURE GRANULOCYTES: 0 10*3/uL (ref 0.0–0.1)
BASOS PCT: 1 %
Basophils Absolute: 0.1 10*3/uL (ref 0.0–0.1)
Eosinophils Absolute: 0.2 10*3/uL (ref 0.0–0.7)
Eosinophils Relative: 3 %
HCT: 41.7 % (ref 36.0–46.0)
HEMOGLOBIN: 12.6 g/dL (ref 12.0–15.0)
IMMATURE GRANULOCYTES: 0 %
LYMPHS ABS: 1.8 10*3/uL (ref 0.7–4.0)
LYMPHS PCT: 23 %
MCH: 28.4 pg (ref 26.0–34.0)
MCHC: 30.2 g/dL (ref 30.0–36.0)
MCV: 94.1 fL (ref 78.0–100.0)
MONO ABS: 0.5 10*3/uL (ref 0.1–1.0)
MONOS PCT: 7 %
NEUTROS ABS: 5.2 10*3/uL (ref 1.7–7.7)
NEUTROS PCT: 66 %
PLATELETS: 247 10*3/uL (ref 150–400)
RBC: 4.43 MIL/uL (ref 3.87–5.11)
RDW: 13.1 % (ref 11.5–15.5)
WBC: 7.9 10*3/uL (ref 4.0–10.5)

## 2018-04-12 NOTE — Progress Notes (Addendum)
PCP - Dr. Kern [place]  Cardiologist - Denies  Chest x-ray - Denies  EKG - 04/12/18  Stress Test - Denies  ECHO - 02/22/12 (E)  Cardiac Cath - 1990's- neg  Sleep Study - Denies CPAP - None  LABS- 04/12/18: CBC w/D, CMP  ASA- Denies  Discussed pt's elevated bp 182/76 with the pt and daughter. Daughter sts her pressure tends to go up when she has to come to the hospital. Pt sts she woke up with a headache today, that she has daily, and that it will subside when she leaves. BP repeated, 178/90. Pt has a hx of HTN, but is not on any medicines. Daughter sts she was taken off due to readings being too low.    Anesthesia- Yes- elevated bp   Pt denies having chest pain, sob, or fever at this time. All instructions explained to the pt, with a verbal understanding of the material. Pt agrees to go over the instructions while at home for a better understanding. The opportunity to ask questions was provided.

## 2018-04-16 NOTE — Progress Notes (Signed)
Anesthesia Chart Review:  Case:  [id number] Date/Time:  04/22/18 1315   Procedures:      OPEN REPAIR INCISIONAL HERNIA ERAS PATHWAY (N/A )     INSERTION OF MESH (N/A )   Anesthesia type:  General   Pre-op diagnosis:  incisional hernia   Location:  [place] OR ROOM 08 / [place] OR   Surgeon:  [name], MD      DISCUSSION: 83 yo female never smoker for above procedure. Pertinent hx includes PONV, GERD, Hiatal hernia, Esophageal spasm, Hx of Hypokalemia, Vasovagal syncope, Hypothyroid s/p thyroidectomy, HA, Paresthesias of LUE, HTN, Heart murmur, s/p left colectomy 03/09/2017.  Pt with elevated BP at PAT appt. 178/90. Per nursing notes the pt's daughter stated the pt used to take AntiHTN meds but they were stopped due to hypotension and she is not currently on any meds for BP. I called Dr. Darrel Hoover office to make him aware of elevated BP and to reiterate that markedly uncontrolled BP on DOS could be cause for cancellation. Pt denied any CP or other cardiac symptoms as PAT appt.  Anticipate she can proceed with surgery as planned if BP acceptable on DOS.  VS: BP (!) 178/90 Comment: taken manually notified Adrian RN  Pulse 77   Temp 37 C   Resp 20   Ht 5\' 6"  (1.676 m)   Wt 71.6 kg   SpO2 98%   BMI 25.47 kg/m   PROVIDERS: Caryl Bis, MD is PCP   LABS: Labs reviewed: Acceptable for surgery. (all labs ordered are listed, but only abnormal results are displayed)  Labs Reviewed  CBC WITH DIFFERENTIAL/PLATELET  COMPREHENSIVE METABOLIC PANEL     IMAGES: CHEST  2 VIEW 07/20/2014  COMPARISON:  10/26/2013 and earlier.  FINDINGS: Stable postoperative changes to the left axilla and chest wall. Moderate hiatal hernia appears stable. Other cardiac and mediastinal contours are stable and within normal limits; tortuous descending thoracic aorta. No pneumothorax, pulmonary edema, pleural effusion or acute pulmonary opacity. Stable cholecystectomy clips. No acute osseous abnormality  identified.  IMPRESSION: No acute cardiopulmonary abnormality.    EKG: 04/12/2018: Sinus bradycardia. Left axis deviation. Minimal voltage criteria for LVH, may be normal variant   CV: Echo 02/22/2012: Study Conclusions  - Left ventricle: The cavity size was normal. Wall thickness was normal. Systolic function was normal. The estimated ejection fraction was in the range of 55% to 60%. Wall motion was normal; there were no regional wall motion abnormalities. Doppler parameters are consistent with abnormal left ventricular relaxation (grade 1 diastolic dysfunction). - Aortic valve: There was no stenosis. - Mitral valve: Trivial regurgitation. - Left atrium: The atrium was mildly dilated. - Right ventricle: The cavity size was normal. Systolic function was normal. - Right atrium: The atrium was mildly dilated. - Tricuspid valve: Peak RV-RA gradient: 34mm Hg (S). - Pulmonary arteries: PA systolic pressure 04-54 mmHg. - Systemic veins: IVC measured 2.0 cm with normal respirophasic variation, suggesting RA pressure 6-10 mmHg. Impressions:  - Normal LV size and systolic function, EF 09-81%. Mild biatrial enlargement. Normal RV size and systolic function. Borderline pulmonary hypertension.  Past Medical History:  Diagnosis Date  . Breast cancer ([place]) 2003  . Diverticulitis    History  . Dyslexia   . GERD (gastroesophageal reflux disease)    takes Nexium daily  . H/O hiatal hernia   . Headache(784.0)    all the time  . Heart murmur   . History of esophageal spasm   . History of IBS  no problem in past yr  . History of kidney stones   . History of shingles   . HTN (hypertension)    hx of  . Hyperlipidemia    takes Pravastatin daily  . Hypokalemia   . Hypothyroidism    takes Synthroid daily  . Impaired hearing   . Incisional hernia    Abdomen  . Joint pain   . Joint swelling   . Neck pain    pinched   . Osteoarthritis   . Osteoporosis  09/08/2011  . PONV (postoperative nausea and vomiting)   . Syncope, non cardiac   . Weakness    numbness left arm    Past Surgical History:  Procedure Laterality Date  . APPENDECTOMY    . Arm Fx     left  . BREAST LUMPECTOMY     Left  . CARDIAC CATHETERIZATION     x 3-last one in the 90's per pt  . CATARACT EXTRACTION    . CATARACT EXTRACTION W/PHACO  10/02/2011   Procedure: CATARACT EXTRACTION PHACO AND INTRAOCULAR LENS PLACEMENT ([facility]);  Surgeon: [name], MD;  Location: [facility];  Service: Ophthalmology;  Laterality: Right;  CDE:17.01  . CHOLECYSTECTOMY  1997   Status post -stones  . COLONOSCOPY  2012   Dr. Gala Romney: pancolonic diverticulosis, tubular adenoma removed from cecum. next TCS 2019  . COLONOSCOPY N/A 11/17/2016   Procedure: COLONOSCOPY;  Surgeon: [name], MD;  Location: [facility] SUITE;  Service: Endoscopy;  Laterality: N/A;  1200 - moved to 3/16 @ 12:00  . EGD with dilitation     2004/2005 schatki's ring s/p dilation, wrap no longer intact on 2005 egd.  . ESOPHAGOGASTRODUODENOSCOPY N/A 11/17/2016   Procedure: ESOPHAGOGASTRODUODENOSCOPY (EGD);  Surgeon: [name], MD;  Location: [facility];  Service: Endoscopy;  Laterality: N/A;  . kidney stent  06/04/11   removed after about 3 weeks.  Marland Kitchen [place]  . KNEE SURGERY     bilateral  . LAPAROSCOPIC NISSEN FUNDOPLICATION  3419  . LAPAROSCOPIC PARTIAL COLECTOMY N/A 03/09/2017   Procedure: LAPAROSCOPY, TAKEDOWN SPLENIC FLEXURE, LEFT COLECTOMY, PROCTOSCOPY;  Surgeon: [name], MD;  Location: [facility];  Service: General;  Laterality: N/A;  . LITHOTRIPSY  11/12  . MANDIBLE RECONSTRUCTION    . NECK SURGERY  2000   [facility]  . PARATHYROIDECTOMY  12/03/2013   DR TEOH  . PROCTOSCOPY  03/09/2017   Procedure: PROCTOSCOPY;  Surgeon: [name], MD;  Location: [facility];  Service: General;;  . SHOULDER SURGERY Left   . THYROIDECTOMY     Hypothyroidism status post  . THYROIDECTOMY N/A 12/03/2013   Procedure:  PARATHYROIDECTOMY;  Surgeon: [name], MD;  Location: [place];  Service: ENT;  Laterality: N/A;  . TONSILLECTOMY    . TUBAL LIGATION    . WRIST SURGERY Left 2009    MEDICATIONS: . acetaminophen (TYLENOL) 650 MG CR tablet  . Calcium Citrate-Vitamin D (CALCIUM + D PO)  . latanoprost (XALATAN) 0.005 % ophthalmic solution  . levothyroxine (SYNTHROID, LEVOTHROID) 88 MCG tablet  . Multiple Vitamins-Minerals (CENTRUM SILVER ADULT 50+ PO)  . pantoprazole (PROTONIX) 40 MG tablet  . pravastatin (PRAVACHOL) 20 MG tablet   No current facility-administered medications for this encounter.    Marland Kitchen 0.9 %  sodium chloride infusion  . 0.9 %  sodium chloride infusion     Wynonia Musty [facility] Short Stay Center/Anesthesiology Phone [phone] 04/16/2018 11:29 AM

## 2018-04-19 MED ORDER — BUPIVACAINE LIPOSOME 1.3 % IJ SUSP
20.0000 mL | INTRAMUSCULAR | Status: DC
  Filled 2018-04-19: qty 20

## 2018-04-20 NOTE — H&P (Signed)
[name]  Location: [facility] Patient #: [id number] DOB: Aug 01, 1935 Widowed / Language: [name] / Race: White Female       History of Present Illness       This is an 83 year old female who returns to see me about her incisional hernia. Beckey Downing is her PCP and has sent me a risk assessment and cardiac clearance for surgery. Dr. Buford Dresser is her gastroenterologist.       On February 07, 2017 she underwent laparoscopic takedown of splenic flexure left colectomy with primary handsewn anastomosis for benign diverticulitis with stricture and intramural abscess. She had a blood transfusion in the hospital she's done well since that surgery. She also has a history of abdominal hysterectomy and hiatal hernia repair she now has a several month history of some left-sided abdominal discomfort and a bulge. This bothers her and she wants the hernia repaired. Never incarcerated or obstructed. She has a mild memory disorder although not profound.       CT scan shows incisional hernia to the left of the umbilicus. Baseball-sized defect with nonobstructed colon in the hernia. There is a tiny midline hernia in the epigastrium. No intra-abdominal abnormalities.       Comorbidities  include thyroidectomy on Synthroid. Hiatal hernia repair. Left colectomy by me for diverticulitis. Abdominal hysterectomy. Heart murmur but no cardiac problems or symptoms. Hypertension. GERD. Recent fall without major injury. History left breast cancer. History of anemia. Mild memory disorder. CKD.       She wants to have this repaired and I think that is reasonable. This will need to be an open repair, retrorectus or possibly TAR. I discussed the indications, details, techniques, and numerous risk with her and her daughter. They're aware the risk of bleeding, infection, recurrence, chronic pain, injury to adjacent organs with major reconstructive surgery and alteration of plan, cardiac pulmonary  thromboembolic problems. She understands all these issues. All questions were answered. She agrees with this plan.   Allergies  Metronidazole (Topical) *DERMATOLOGICALS*  OxyCODONE HCl (Abuse Deter) *ANALGESICS - OPIOID*  Ciprofloxacin *FLUOROQUINOLONES*  Latex  Allergies Reconciled   Medication History  Acetaminophen (650MG  Tablet, Oral as needed) Active. Calcium (Oral) Specific strength unknown - Active. Levothyroxine Sodium (88MCG Tablet, Oral) Active. bone infusion (twice a year) Active. Multi Vitamin (Oral) Active. Pravastatin Sodium (20MG  Tablet, Oral) Active. Medications Reconciled  Vitals  Weight: 155.38 lb Height: 66in Body Surface Area: 1.8 m Body Mass Index: 25.08 kg/m  Temp.: 98.99F  Pulse: 93 (Regular)  Resp.: 18 (Unlabored)  P.OX: 98% (Room air)    Physical Exam  General Mental Status-Alert. General Appearance-Not in acute distress. Build & Nutrition-Well nourished. Posture-Normal posture. Gait-Normal. Note: Ability independently. Looks well. Daughter present throughout the encounter   Head and Neck Head-normocephalic, atraumatic with no lesions or palpable masses. Trachea-midline. Thyroid Gland Characteristics - normal size and consistency and no palpable nodules.  Chest and Lung Exam Chest and lung exam reveals -on auscultation, normal breath sounds, no adventitious sounds and normal vocal resonance.  Cardiovascular Cardiovascular examination reveals -normal heart sounds, regular rate and rhythm with no murmurs and femoral artery auscultation bilaterally reveals normal pulses, no bruits, no thrills.  Abdomen Note: Abdomen is soft and nontender. Nondistended. There is a midline incision. This allowed a transverse incision. Laparoscopic port site. There is a hernia the size of a baseball or larger to the left of the midline at the umbilicus. Reducible when supine. The fixed defect is at least 6 cm. No  masses.  No organomegaly   Neurologic Neurologic evaluation reveals -alert and oriented x 3 with no impairment of recent or remote memory, normal attention span and ability to concentrate, normal sensation and normal coordination.  Musculoskeletal Normal Exam - Bilateral-Upper Extremity Strength Normal and Lower Extremity Strength Normal.    Assessment & Plan  INCISIONAL HERNIA, WITHOUT OBSTRUCTION OR GANGRENE (K43.2).    Dr. Quillian Quince, your primary physician, thinks that you are a good candidate for surgery and general anesthesia Her CT scan shows an incisional hernia, but there were no unusual findings. No evidence of blockage, tumors or infection  you stated that you would like to have this hernia repaired and I think that is reasonable I have discussed this with you and your daughter you will be scheduled for open ventral hernia repair with mesh This means you will have a reasonably long incision from top to bottom but we will need to do that to bring the muscles together. A laparoscopic repair would not be appropriate with a hernia this large  I have discussed the indications, techniques, and risk of the surgery with you both in detail   . HISTORY OF DIVERTICULITIS OF COLON (Z87.19) HISTORY OF LEFT BREAST CANCER (Z85.3) MEMORY DISORDER (R41.3) HISTORY OF REPAIR OF HIATAL HERNIA (Z98.890) CKD (CHRONIC KIDNEY DISEASE) (N18.9)    Edsel Petrin. Dalbert Batman, M.D., [facility] General and Minimally invasive Surgery Breast and Colorectal Surgery Office:   [phone] Pager:   [phone]

## 2018-04-22 ENCOUNTER — Encounter (HOSPITAL_COMMUNITY)
Admission: RE | Disposition: A | Discharge status: Home / Self Care | Payer: Self-pay | Source: Ambulatory Visit | Attending: General Surgery

## 2018-04-22 ENCOUNTER — Ambulatory Visit (HOSPITAL_COMMUNITY): Payer: Medicare Other | Admitting: Certified Registered Nurse Anesthetist

## 2018-04-22 ENCOUNTER — Ambulatory Visit (HOSPITAL_COMMUNITY)
Admission: RE | Admit: 2018-04-22 | Discharge: 2018-04-26 | Disposition: A | Discharge status: Home / Self Care | Payer: Medicare Other | Source: Ambulatory Visit | Attending: General Surgery | Admitting: General Surgery

## 2018-04-22 ENCOUNTER — Encounter (HOSPITAL_COMMUNITY): Payer: Self-pay | Admitting: *Deleted

## 2018-04-22 ENCOUNTER — Ambulatory Visit (HOSPITAL_COMMUNITY): Payer: Medicare Other | Admitting: Physician Assistant

## 2018-04-22 DIAGNOSIS — Z885 Allergy status to narcotic agent status: Secondary | ICD-10-CM | POA: Insufficient documentation

## 2018-04-22 DIAGNOSIS — Z853 Personal history of malignant neoplasm of breast: Secondary | ICD-10-CM | POA: Insufficient documentation

## 2018-04-22 DIAGNOSIS — K219 Gastro-esophageal reflux disease without esophagitis: Secondary | ICD-10-CM | POA: Diagnosis not present

## 2018-04-22 DIAGNOSIS — I10 Essential (primary) hypertension: Secondary | ICD-10-CM | POA: Diagnosis not present

## 2018-04-22 DIAGNOSIS — K432 Incisional hernia without obstruction or gangrene: Secondary | ICD-10-CM | POA: Diagnosis present

## 2018-04-22 DIAGNOSIS — E039 Hypothyroidism, unspecified: Secondary | ICD-10-CM | POA: Insufficient documentation

## 2018-04-22 DIAGNOSIS — R262 Difficulty in walking, not elsewhere classified: Secondary | ICD-10-CM | POA: Insufficient documentation

## 2018-04-22 DIAGNOSIS — Z881 Allergy status to other antibiotic agents status: Secondary | ICD-10-CM | POA: Insufficient documentation

## 2018-04-22 DIAGNOSIS — I129 Hypertensive chronic kidney disease with stage 1 through stage 4 chronic kidney disease, or unspecified chronic kidney disease: Secondary | ICD-10-CM | POA: Diagnosis not present

## 2018-04-22 DIAGNOSIS — N189 Chronic kidney disease, unspecified: Secondary | ICD-10-CM | POA: Diagnosis not present

## 2018-04-22 DIAGNOSIS — D62 Acute posthemorrhagic anemia: Secondary | ICD-10-CM | POA: Insufficient documentation

## 2018-04-22 DIAGNOSIS — Z79899 Other long term (current) drug therapy: Secondary | ICD-10-CM | POA: Insufficient documentation

## 2018-04-22 DIAGNOSIS — Z9049 Acquired absence of other specified parts of digestive tract: Secondary | ICD-10-CM | POA: Insufficient documentation

## 2018-04-22 HISTORY — DX: Incisional hernia without obstruction or gangrene: K43.2

## 2018-04-22 HISTORY — PX: INSERTION OF MESH: SHX5868

## 2018-04-22 HISTORY — PX: INCISIONAL HERNIA REPAIR: SHX193

## 2018-04-22 LAB — CBC
HEMATOCRIT: 35.3 % — AB (ref 36.0–46.0)
Hemoglobin: 10.7 g/dL — ABNORMAL LOW (ref 12.0–15.0)
MCH: 28.5 pg (ref 26.0–34.0)
MCHC: 30.3 g/dL (ref 30.0–36.0)
MCV: 94.1 fL (ref 78.0–100.0)
Platelets: 205 10*3/uL (ref 150–400)
RBC: 3.75 MIL/uL — ABNORMAL LOW (ref 3.87–5.11)
RDW: 13.3 % (ref 11.5–15.5)
WBC: 15.8 10*3/uL — ABNORMAL HIGH (ref 4.0–10.5)

## 2018-04-22 LAB — CREATININE, SERUM
Creatinine, Ser: 0.79 mg/dL (ref 0.44–1.00)
GFR calc non Af Amer: >60 mL/min (ref >60)

## 2018-04-22 SURGERY — REPAIR, HERNIA, INCISIONAL
Anesthesia: General | Site: Abdomen

## 2018-04-22 MED ORDER — CALCIUM CITRATE-VITAMIN D 315-200 MG-UNIT PO TABS: 1.0000 | ORAL_TABLET | Freq: Every day | ORAL | Status: DC

## 2018-04-22 MED ORDER — ONDANSETRON 4 MG PO TBDP: 4.0000 mg | ORAL_TABLET | Freq: Four times a day (QID) | ORAL | Status: DC | PRN

## 2018-04-22 MED ORDER — GABAPENTIN 300 MG PO CAPS
300.0000 mg | ORAL_CAPSULE | ORAL | Status: AC
  Administered 2018-04-22: 300 mg via ORAL

## 2018-04-22 MED ORDER — PHENYLEPHRINE 40 MCG/ML (10ML) SYRINGE FOR IV PUSH (FOR BLOOD PRESSURE SUPPORT)
PREFILLED_SYRINGE | INTRAVENOUS | Status: AC
  Filled 2018-04-22: qty 30

## 2018-04-22 MED ORDER — ROCURONIUM BROMIDE 50 MG/5ML IV SOSY
PREFILLED_SYRINGE | INTRAVENOUS | Status: AC
  Filled 2018-04-22: qty 10

## 2018-04-22 MED ORDER — METHOCARBAMOL 500 MG PO TABS
500.0000 mg | ORAL_TABLET | Freq: Four times a day (QID) | ORAL | Status: DC | PRN
  Administered 2018-04-22 – 2018-04-24 (×3): 500 mg via ORAL
  Filled 2018-04-22 (×4): qty 1

## 2018-04-22 MED ORDER — CALCIUM CARBONATE-VITAMIN D 500-200 MG-UNIT PO TABS
1.0000 | ORAL_TABLET | Freq: Every day | ORAL | Status: DC
  Administered 2018-04-23 – 2018-04-25 (×3): 1 via ORAL
  Filled 2018-04-22 (×3): qty 1

## 2018-04-22 MED ORDER — SODIUM CHLORIDE 0.9 % IV SOLN
INTRAVENOUS | Status: AC
  Filled 2018-04-22: qty 500000

## 2018-04-22 MED ORDER — ONDANSETRON HCL 4 MG/2ML IJ SOLN
INTRAMUSCULAR | Status: AC
  Administered 2018-04-22: 4 mg via INTRAVENOUS
  Filled 2018-04-22: qty 2

## 2018-04-22 MED ORDER — HYDROCODONE-ACETAMINOPHEN 5-325 MG PO TABS
1.0000 | ORAL_TABLET | ORAL | Status: DC | PRN
  Administered 2018-04-23 (×3): 2 via ORAL
  Filled 2018-04-22 (×3): qty 2

## 2018-04-22 MED ORDER — BUPIVACAINE HCL (PF) 0.5 % IJ SOLN: INTRAMUSCULAR | Status: DC | PRN

## 2018-04-22 MED ORDER — ACETAMINOPHEN 500 MG PO TABS
ORAL_TABLET | ORAL | Status: AC
  Administered 2018-04-22: 1000 mg via ORAL
  Filled 2018-04-22: qty 2

## 2018-04-22 MED ORDER — CEFAZOLIN SODIUM-DEXTROSE 2-4 GM/100ML-% IV SOLN
INTRAVENOUS | Status: AC
  Filled 2018-04-22: qty 100

## 2018-04-22 MED ORDER — ACETAMINOPHEN 500 MG PO TABS
1000.0000 mg | ORAL_TABLET | ORAL | Status: AC
  Administered 2018-04-22: 1000 mg via ORAL

## 2018-04-22 MED ORDER — ADULT MULTIVITAMIN W/MINERALS CH
1.0000 | ORAL_TABLET | Freq: Every day | ORAL | Status: DC
  Administered 2018-04-23 – 2018-04-25 (×3): 1 via ORAL
  Filled 2018-04-22 (×3): qty 1

## 2018-04-22 MED ORDER — TRAMADOL HCL 50 MG PO TABS
50.0000 mg | ORAL_TABLET | Freq: Four times a day (QID) | ORAL | Status: DC | PRN
  Administered 2018-04-24 – 2018-04-25 (×3): 50 mg via ORAL
  Filled 2018-04-22 (×3): qty 1

## 2018-04-22 MED ORDER — LEVOTHYROXINE SODIUM 88 MCG PO TABS
88.0000 ug | ORAL_TABLET | Freq: Every day | ORAL | Status: DC
  Administered 2018-04-23 – 2018-04-26 (×4): 88 ug via ORAL
  Filled 2018-04-22 (×4): qty 1

## 2018-04-22 MED ORDER — PHENYLEPHRINE 40 MCG/ML (10ML) SYRINGE FOR IV PUSH (FOR BLOOD PRESSURE SUPPORT)
PREFILLED_SYRINGE | INTRAVENOUS | Status: AC
  Filled 2018-04-22: qty 10

## 2018-04-22 MED ORDER — ONDANSETRON HCL 4 MG/2ML IJ SOLN
4.0000 mg | Freq: Four times a day (QID) | INTRAMUSCULAR | Status: DC | PRN
  Filled 2018-04-22: qty 2

## 2018-04-22 MED ORDER — SUGAMMADEX SODIUM 200 MG/2ML IV SOLN
INTRAVENOUS | Status: DC | PRN
  Administered 2018-04-22: 145 mg via INTRAVENOUS

## 2018-04-22 MED ORDER — HYDROMORPHONE HCL 1 MG/ML IJ SOLN
0.5000 mg | INTRAMUSCULAR | Status: DC | PRN
  Administered 2018-04-22: 0.5 mg via INTRAVENOUS
  Filled 2018-04-22 (×2): qty 1

## 2018-04-22 MED ORDER — LACTATED RINGERS IV SOLN
INTRAVENOUS | Status: DC
  Administered 2018-04-22 – 2018-04-25 (×5): via INTRAVENOUS

## 2018-04-22 MED ORDER — GABAPENTIN 300 MG PO CAPS
ORAL_CAPSULE | ORAL | Status: AC
  Administered 2018-04-22: 300 mg via ORAL
  Filled 2018-04-22: qty 1

## 2018-04-22 MED ORDER — ONDANSETRON HCL 4 MG/2ML IJ SOLN
4.0000 mg | Freq: Once | INTRAMUSCULAR | Status: AC | PRN
  Administered 2018-04-22: 4 mg via INTRAVENOUS

## 2018-04-22 MED ORDER — ESMOLOL HCL 100 MG/10ML IV SOLN
INTRAVENOUS | Status: AC
  Filled 2018-04-22: qty 20

## 2018-04-22 MED ORDER — FENTANYL CITRATE (PF) 100 MCG/2ML IJ SOLN
INTRAMUSCULAR | Status: DC | PRN
  Administered 2018-04-22 (×2): 50 ug via INTRAVENOUS
  Administered 2018-04-22: 25 ug via INTRAVENOUS
  Administered 2018-04-22 (×2): 50 ug via INTRAVENOUS
  Administered 2018-04-22: 25 ug via INTRAVENOUS
  Administered 2018-04-22: 50 ug via INTRAVENOUS

## 2018-04-22 MED ORDER — ENOXAPARIN SODIUM 40 MG/0.4ML ~~LOC~~ SOLN
40.0000 mg | SUBCUTANEOUS | Status: DC
  Administered 2018-04-23: 40 mg via SUBCUTANEOUS
  Filled 2018-04-22: qty 0.4

## 2018-04-22 MED ORDER — LATANOPROST 0.005 % OP SOLN
1.0000 [drp] | Freq: Every day | OPHTHALMIC | Status: DC
  Administered 2018-04-22 – 2018-04-25 (×4): 1 [drp] via OPHTHALMIC
  Filled 2018-04-22: qty 2.5

## 2018-04-22 MED ORDER — LACTATED RINGERS IV SOLN
INTRAVENOUS | Status: DC
  Administered 2018-04-22 (×2): via INTRAVENOUS

## 2018-04-22 MED ORDER — SENNA 8.6 MG PO TABS
1.0000 | ORAL_TABLET | Freq: Two times a day (BID) | ORAL | Status: DC
  Administered 2018-04-22 – 2018-04-25 (×7): 8.6 mg via ORAL
  Filled 2018-04-22 (×7): qty 1

## 2018-04-22 MED ORDER — 0.9 % SODIUM CHLORIDE (POUR BTL) OPTIME
TOPICAL | Status: DC | PRN
  Administered 2018-04-22 (×2): 1000 mL

## 2018-04-22 MED ORDER — SCOPOLAMINE 1 MG/3DAYS TD PT72
MEDICATED_PATCH | TRANSDERMAL | Status: AC
  Filled 2018-04-22: qty 1

## 2018-04-22 MED ORDER — HYDROMORPHONE HCL 1 MG/ML IJ SOLN
INTRAMUSCULAR | Status: AC
  Administered 2018-04-22: 0.5 mg via INTRAVENOUS
  Filled 2018-04-22: qty 1

## 2018-04-22 MED ORDER — HYDROMORPHONE HCL 1 MG/ML IJ SOLN
0.2500 mg | INTRAMUSCULAR | Status: DC | PRN
  Administered 2018-04-22 (×2): 0.5 mg via INTRAVENOUS

## 2018-04-22 MED ORDER — GABAPENTIN 300 MG PO CAPS
300.0000 mg | ORAL_CAPSULE | Freq: Two times a day (BID) | ORAL | Status: DC
  Administered 2018-04-22 – 2018-04-25 (×7): 300 mg via ORAL
  Filled 2018-04-22 (×7): qty 1

## 2018-04-22 MED ORDER — PROPOFOL 10 MG/ML IV BOLUS
INTRAVENOUS | Status: DC | PRN
  Administered 2018-04-22: 100 mg via INTRAVENOUS
  Administered 2018-04-22: 20 mg via INTRAVENOUS

## 2018-04-22 MED ORDER — PANTOPRAZOLE SODIUM 40 MG PO TBEC
40.0000 mg | DELAYED_RELEASE_TABLET | Freq: Every day | ORAL | Status: DC
  Administered 2018-04-23 – 2018-04-25 (×3): 40 mg via ORAL
  Filled 2018-04-22 (×3): qty 1

## 2018-04-22 MED ORDER — CEFAZOLIN SODIUM-DEXTROSE 2-4 GM/100ML-% IV SOLN
2.0000 g | Freq: Three times a day (TID) | INTRAVENOUS | Status: AC
  Administered 2018-04-22: 2 g via INTRAVENOUS
  Filled 2018-04-22: qty 100

## 2018-04-22 MED ORDER — ONDANSETRON HCL 4 MG/2ML IJ SOLN
INTRAMUSCULAR | Status: AC
  Filled 2018-04-22: qty 6

## 2018-04-22 MED ORDER — SODIUM CHLORIDE 0.9 % IV SOLN
INTRAVENOUS | Status: DC | PRN
  Administered 2018-04-22: 20 mL

## 2018-04-22 MED ORDER — FENTANYL CITRATE (PF) 250 MCG/5ML IJ SOLN
INTRAMUSCULAR | Status: AC
  Filled 2018-04-22: qty 5

## 2018-04-22 MED ORDER — GLYCOPYRROLATE PF 0.2 MG/ML IJ SOSY
PREFILLED_SYRINGE | INTRAMUSCULAR | Status: AC
  Filled 2018-04-22: qty 1

## 2018-04-22 MED ORDER — CHLORHEXIDINE GLUCONATE CLOTH 2 % EX PADS: 6.0000 | MEDICATED_PAD | Freq: Once | CUTANEOUS | Status: DC

## 2018-04-22 MED ORDER — ROCURONIUM BROMIDE 10 MG/ML (PF) SYRINGE
PREFILLED_SYRINGE | INTRAVENOUS | Status: DC | PRN
  Administered 2018-04-22: 10 mg via INTRAVENOUS
  Administered 2018-04-22: 50 mg via INTRAVENOUS
  Administered 2018-04-22 (×2): 10 mg via INTRAVENOUS

## 2018-04-22 MED ORDER — ROCURONIUM BROMIDE 50 MG/5ML IV SOSY
PREFILLED_SYRINGE | INTRAVENOUS | Status: AC
  Filled 2018-04-22: qty 5

## 2018-04-22 MED ORDER — ESMOLOL HCL 100 MG/10ML IV SOLN
INTRAVENOUS | Status: DC | PRN
  Administered 2018-04-22 (×3): 20 mg via INTRAVENOUS
  Administered 2018-04-22: 10 mg via INTRAVENOUS

## 2018-04-22 MED ORDER — LIDOCAINE 2% (20 MG/ML) 5 ML SYRINGE
INTRAMUSCULAR | Status: AC
  Filled 2018-04-22: qty 10

## 2018-04-22 MED ORDER — SODIUM CHLORIDE 0.9 % IJ SOLN
INTRAMUSCULAR | Status: AC
  Filled 2018-04-22: qty 10

## 2018-04-22 MED ORDER — CELECOXIB 200 MG PO CAPS
ORAL_CAPSULE | ORAL | Status: AC
  Administered 2018-04-22: 200 mg via ORAL
  Filled 2018-04-22: qty 1

## 2018-04-22 MED ORDER — PHENYLEPHRINE 40 MCG/ML (10ML) SYRINGE FOR IV PUSH (FOR BLOOD PRESSURE SUPPORT)
PREFILLED_SYRINGE | INTRAVENOUS | Status: DC | PRN
  Administered 2018-04-22: 80 ug via INTRAVENOUS

## 2018-04-22 MED ORDER — PRAVASTATIN SODIUM 10 MG PO TABS
20.0000 mg | ORAL_TABLET | Freq: Every morning | ORAL | Status: DC
  Administered 2018-04-23 – 2018-04-25 (×3): 20 mg via ORAL
  Filled 2018-04-22 (×3): qty 2

## 2018-04-22 MED ORDER — GLYCOPYRROLATE PF 0.2 MG/ML IJ SOSY
PREFILLED_SYRINGE | INTRAMUSCULAR | Status: DC | PRN
  Administered 2018-04-22: .2 mg via INTRAVENOUS

## 2018-04-22 MED ORDER — ONDANSETRON HCL 4 MG/2ML IJ SOLN
INTRAMUSCULAR | Status: DC | PRN
  Administered 2018-04-22: 4 mg via INTRAVENOUS

## 2018-04-22 MED ORDER — SCOPOLAMINE 1 MG/3DAYS TD PT72
MEDICATED_PATCH | TRANSDERMAL | Status: DC | PRN
  Administered 2018-04-22: 1 via TRANSDERMAL

## 2018-04-22 MED ORDER — PROMETHAZINE HCL 25 MG/ML IJ SOLN
INTRAMUSCULAR | Status: DC | PRN
  Administered 2018-04-22: 6.25 mg via INTRAVENOUS

## 2018-04-22 MED ORDER — LIDOCAINE 2% (20 MG/ML) 5 ML SYRINGE
INTRAMUSCULAR | Status: DC | PRN
  Administered 2018-04-22: 100 mg via INTRAVENOUS

## 2018-04-22 MED ORDER — MEPERIDINE HCL 50 MG/ML IJ SOLN: 6.2500 mg | INTRAMUSCULAR | Status: DC | PRN

## 2018-04-22 MED ORDER — CEFAZOLIN SODIUM-DEXTROSE 2-4 GM/100ML-% IV SOLN
2.0000 g | INTRAVENOUS | Status: AC
  Administered 2018-04-22: 2 g via INTRAVENOUS

## 2018-04-22 MED ORDER — CELECOXIB 200 MG PO CAPS
200.0000 mg | ORAL_CAPSULE | ORAL | Status: AC
  Administered 2018-04-22: 200 mg via ORAL

## 2018-04-22 SURGICAL SUPPLY — 58 items
ADH SKN CLS LQ APL DERMABOND (GAUZE/BANDAGES/DRESSINGS) ×1
BAG DECANTER FOR FLEXI CONT (MISCELLANEOUS) IMPLANT
BINDER ABDOMINAL 12 ML 46-62 (SOFTGOODS) ×3 IMPLANT
BIOPATCH RED 1 DISK 7.0 (GAUZE/BANDAGES/DRESSINGS) ×2 IMPLANT
BIOPATCH RED 1IN DISK 7.0MM (GAUZE/BANDAGES/DRESSINGS) ×2
BLADE CLIPPER SURG (BLADE) IMPLANT
CANISTER SUCT 3000ML PPV (MISCELLANEOUS) ×3 IMPLANT
CHLORAPREP W/TINT 26ML (MISCELLANEOUS) ×3 IMPLANT
COVER SURGICAL LIGHT HANDLE (MISCELLANEOUS) ×3 IMPLANT
DERMABOND ADHESIVE PROPEN (GAUZE/BANDAGES/DRESSINGS) ×2
DERMABOND ADVANCED .7 DNX6 (GAUZE/BANDAGES/DRESSINGS) IMPLANT
DEVICE TROCAR PUNCTURE CLOSURE (ENDOMECHANICALS) ×2 IMPLANT
DRAIN CHANNEL 19F RND (DRAIN) ×4 IMPLANT
DRAPE LAPAROSCOPIC ABDOMINAL (DRAPES) ×3 IMPLANT
DRSG OPSITE POSTOP 4X10 (GAUZE/BANDAGES/DRESSINGS) ×2 IMPLANT
DRSG TEGADERM 4X4.75 (GAUZE/BANDAGES/DRESSINGS) ×4 IMPLANT
ELECT REM PT RETURN 9FT ADLT (ELECTROSURGICAL) ×3
ELECTRODE REM PT RTRN 9FT ADLT (ELECTROSURGICAL) ×1 IMPLANT
EVACUATOR SILICONE 100CC (DRAIN) ×4 IMPLANT
GAUZE SPONGE 4X4 12PLY STRL (GAUZE/BANDAGES/DRESSINGS) ×2 IMPLANT
GLOVE BIOGEL PI IND STRL 6.5 (GLOVE) IMPLANT
GLOVE BIOGEL PI IND STRL 8 (GLOVE) IMPLANT
GLOVE BIOGEL PI INDICATOR 6.5 (GLOVE) ×2
GLOVE BIOGEL PI INDICATOR 8 (GLOVE) ×2
GLOVE SURG SS PI 6.5 STRL IVOR (GLOVE) ×2 IMPLANT
GLOVE SURG SS PI 7.0 STRL IVOR (GLOVE) ×4 IMPLANT
GLOVE SURG SS PI 7.5 STRL IVOR (GLOVE) ×2 IMPLANT
GOWN STRL REUS W/ TWL LRG LVL3 (GOWN DISPOSABLE) ×1 IMPLANT
GOWN STRL REUS W/ TWL XL LVL3 (GOWN DISPOSABLE) ×1 IMPLANT
GOWN STRL REUS W/TWL LRG LVL3 (GOWN DISPOSABLE) ×3
GOWN STRL REUS W/TWL XL LVL3 (GOWN DISPOSABLE) ×3
KIT BASIN OR (CUSTOM PROCEDURE TRAY) ×3 IMPLANT
KIT TURNOVER KIT B (KITS) ×3 IMPLANT
MESH VENTRALIGHT ST 10X13IN (Mesh General) ×2 IMPLANT
NDL HYPO 25GX1X1/2 BEV (NEEDLE) IMPLANT
NEEDLE HYPO 25GX1X1/2 BEV (NEEDLE) ×3 IMPLANT
NS IRRIG 1000ML POUR BTL (IV SOLUTION) ×3 IMPLANT
PACK GENERAL/GYN (CUSTOM PROCEDURE TRAY) ×3 IMPLANT
PAD ARMBOARD 7.5X6 YLW CONV (MISCELLANEOUS) ×3 IMPLANT
PENCIL SMOKE EVACUATOR (MISCELLANEOUS) ×2 IMPLANT
SPONGE LAP 18X18 X RAY DECT (DISPOSABLE) ×4 IMPLANT
STAPLER VISISTAT 35W (STAPLE) ×2 IMPLANT
SUCTION POOLE TIP (SUCTIONS) ×2 IMPLANT
SUT ETHILON 3 0 FSL (SUTURE) ×6 IMPLANT
SUT NOVA 1 T20/GS 25DT (SUTURE) IMPLANT
SUT NOVA NAB DX-16 0-1 5-0 T12 (SUTURE) ×4 IMPLANT
SUT PDS AB 1 CT  36 (SUTURE) ×10
SUT PDS AB 1 CT 36 (SUTURE) IMPLANT
SUT PDS AB 1 CTX 36 (SUTURE) ×4 IMPLANT
SUT VIC AB 2-0 BRD 54 (SUTURE) ×2 IMPLANT
SUT VIC AB 2-0 CT1 27 (SUTURE)
SUT VIC AB 2-0 CT1 TAPERPNT 27 (SUTURE) IMPLANT
SUT VIC AB 3-0 CT1 27 (SUTURE)
SUT VIC AB 3-0 CT1 TAPERPNT 27 (SUTURE) IMPLANT
SUT VIC AB 3-0 SH 18 (SUTURE) ×2 IMPLANT
SYR CONTROL 10ML LL (SYRINGE) ×2 IMPLANT
TOWEL GREEN STERILE (TOWEL DISPOSABLE) ×2 IMPLANT
TRAY FOLEY CATH SILVER 16FR (SET/KITS/TRAYS/PACK) IMPLANT

## 2018-04-22 NOTE — Anesthesia Preprocedure Evaluation (Addendum)
Anesthesia Evaluation  Patient identified by MRN, date of birth, ID band Patient awake    Reviewed: Allergy & Precautions, NPO status , Patient's Chart, lab work & pertinent test results  History of Anesthesia Complications (+) PONV  Airway Mallampati: I  TM Distance: >3 FB Neck ROM: Full    Dental  (+) Edentulous Upper, Edentulous Lower   Pulmonary    Pulmonary exam normal        Cardiovascular hypertension, Pt. on medications Normal cardiovascular exam     Neuro/Psych    GI/Hepatic GERD  Medicated and Controlled,  Endo/Other    Renal/GU      Musculoskeletal   Abdominal   Peds  Hematology   Anesthesia Other Findings   Reproductive/Obstetrics                            Anesthesia Physical Anesthesia Plan  ASA: III  Anesthesia Plan: General   Post-op Pain Management:    Induction: Intravenous  PONV Risk Score and Plan: 4 or greater and Scopolamine patch - Pre-op, Ondansetron and Midazolam  Airway Management Planned: Oral ETT  Additional Equipment:   Intra-op Plan:   Post-operative Plan: Extubation in OR  Informed Consent: I have reviewed the patients History and Physical, chart, labs and discussed the procedure including the risks, benefits and alternatives for the proposed anesthesia with the patient or authorized representative who has indicated his/her understanding and acceptance.     Plan Discussed with: CRNA and Surgeon  Anesthesia Plan Comments:        Anesthesia Quick Evaluation

## 2018-04-22 NOTE — Anesthesia Postprocedure Evaluation (Signed)
Anesthesia Post Note  Patient: [name]  Procedure(s) Performed: OPEN REPAIR INCISIONAL HERNIA ERAS PATHWAY (N/A Abdomen) INSERTION OF MESH (N/A Abdomen)     Patient location during evaluation: PACU Anesthesia Type: General Level of consciousness: awake and alert Pain management: pain level controlled Vital Signs Assessment: post-procedure vital signs reviewed and stable Respiratory status: spontaneous breathing, nonlabored ventilation, respiratory function stable and patient connected to nasal cannula oxygen Cardiovascular status: blood pressure returned to baseline and stable Postop Assessment: no apparent nausea or vomiting Anesthetic complications: no    Last Vitals:  Vitals:   04/22/18 1550 04/22/18 1600  BP: (!) 163/74   Pulse: 68 69  Resp: 11 11  Temp:    SpO2: 96% 97%    Last Pain:  Vitals:   04/22/18 1600  TempSrc:   PainSc: 9                  Noreen Mackintosh DAVID

## 2018-04-22 NOTE — Transfer of Care (Signed)
Immediate Anesthesia Transfer of Care Note  Patient: [name]  Procedure(s) Performed: OPEN REPAIR INCISIONAL HERNIA ERAS PATHWAY (N/A Abdomen) INSERTION OF MESH (N/A Abdomen)  Patient Location: PACU  Anesthesia Type:General  Level of Consciousness: drowsy and patient cooperative  Airway & Oxygen Therapy: Patient Spontanous Breathing and Patient connected to nasal cannula oxygen  Post-op Assessment: Report given to RN  Post vital signs: Reviewed and stable  Last Vitals:  Vitals Value Taken Time  BP 166/73 04/22/2018  3:05 PM  Temp    Pulse 64 04/22/2018  3:06 PM  Resp 12 04/22/2018  3:06 PM  SpO2 100 % 04/22/2018  3:06 PM  Vitals shown include unvalidated device data.  Last Pain:  Vitals:   04/22/18 1144  TempSrc: Oral         Complications: No apparent anesthesia complications

## 2018-04-22 NOTE — Op Note (Addendum)
Patient Name:           [name]   Date of Surgery:        04/22/2018  Pre op Diagnosis:      Ventral incisional hernia  Post op Diagnosis:    Same  Procedure:                  Open repair ventral incisional hernia                                       Bilateral myofascial advancement flap                                       Retrorectus repair                                       Implantation of mesh (30 cm vertical by 25 cm transverse ventral Lex ST composite mesh   Type of repair -mesh repair with myofascial advancement flaps  (choices - primary suture, mesh, or component)  Name of mesh -ventralex ST  Size of mesh - Length 30 cm, Width 25 cm  Mesh overlap - 12  cm laterally,   7 cn superior and inferior.  Placement of mesh -beneath the fascia but external to peritoneal cavity(retrorectus)   (choices - beneath fascia and into peritoneal cavity, beneath fascia but external to peritoneal cavity, between the muscle and fascia, above or external to fascia)    Surgeon:                     Edsel Petrin. Dalbert Batman, M.D., FACS  Assistant:                      Ralene Ok, MD   Indication for Assistant: Complex dissection.  Complex exposure.  Assistant indicated to expedite case and reduce incidence of intraoperative complications  Operative Indications:   This is an 83 year old female who is brought to the operating room electively for repair of her incisional hernia. Beckey Downing is her PCP  Dr. Buford Dresser is her gastroenterologist.       On February 07, 2017 she underwent laparoscopic takedown of splenic flexure left colectomy with primary handsewn anastomosis for benign diverticulitis with stricture and intramural abscess. She had a blood transfusion in the hospital she's done well since that surgery. She also has a history of abdominal hysterectomy and hiatal hernia repair she now has a several month history of some left-sided abdominal discomfort and a bulge. This bothers her and  she wants the hernia repaired. Never incarcerated or obstructed. She has a mild memory disorder although not profound.       CT scan shows incisional hernia to the left of the umbilicus. Baseball-sized defect with nonobstructed colon in the hernia. There is a tiny, 2-3 cm.  midline hernia in the epigastrium. No intra-abdominal abnormalities.       Comorbidities  include thyroidectomy on Synthroid. Hiatal hernia repair. Left colectomy by me for diverticulitis. Abdominal hysterectomy. Heart murmur but no cardiac problems or symptoms. Hypertension. GERD. Recent fall without major injury. History left breast cancer. History of anemia. Mild memory disorder. CKD.       She  wants to have this repaired and I think that is reasonable. This will need to be an open repair, retrorectus or possibly TAR. She agrees with this plan.  Operative Findings:       The larger defect in the periumbilical area was about 6 or 7 cm in size.  The smaller defect was about 3 cm in size and was below the xiphoid process in the midline.  Both defects were incorporated into the repair.  Both rectus muscles were completely intact, however.  I was able to mobilize the posterior rectus sheath and the peritoneum and closed it in the midline primarily.  The mesh fixation sutures inferiorly were at the symphysis pubis and superiorly were at the costal margin and xiphoid.  Laterally they were in the anterior axillary line.  Procedure in Detail:          Following the induction of general endotracheal anesthesia a Foley catheter was inserted.  Intravenous antibiotics were given.  The lower chest and entire abdomen and groins were prepped and draped in a sterile fashion.  Surgical timeout was performed. At the end of the case a 50% solution of Exparel was injected into the rectus muscles bilaterally      The midline incision was reopened with a knife.  Dissection was carried down through subcutaneous tissue.  I dissected down to  the medial edges of the rectus muscles on each side and then carefully entered the abdominal cavity.  There were chronic adhesions but they were soft and easy to take down.  They were completely taken down away from the undersurface of the abdominal wall.  The intra-abdominal visceral looked fine.     On each side I could palpate the rectus muscles.  I incised the posterior rectus sheath near the midline on each side and dissected it away from the muscles.  I took this dissection inferiorly all the way down and connected the dissection in the midline and opened this area up all the way down to the symphysis pubis.  Superiorly I connected the dissection in the midline at the level of the xiphoid.  This allowed me to close all hernia defects.  I then further mobilized the posterior rectus sheath and the posterior layers until I had the dissection all the way out to the anterior axillary line.  I was careful to avoid injury to the nerves to the muscle coming from the lateral aspect.  The wound was irrigated.  Hemostasis was excellent.  I was then able to close the peritoneum and posterior rectus sheath in the midline with running sutures of #1 PDS.      I measured the area to be repaired.  I brought a 32 cm x 25 cm piece of ventralex ST composite mesh to the field.  I trimmed it vertically down to about 30 cm vertically.  I placed  12 fixation sutures of #1 Novafil.  3 sutures superiorly in the midline.  3 sutures inferiorly in the midline and 3 lateral sutures on each side.  The sutures were placed in the mesh and then passed through the abdominal wall with an Endo Close device.  After all 12 sutures were placed everything spread out quite nicely and I was very satisfied with the internal coverage of the mesh.  Great care was taken to put the smooth side of the mesh down toward the peritoneum and the rough side up toward the posterior belly of the rectus muscle.  All 12 sutures were tied.  The wound was irrigated.   There did not seem to be any active bleeding.  I placed two 19 [place] Blake drains into the space anterior to the mesh and brought them out through separate stab incisions lateral to the xiphoid process.  The drains were sutured to the skin and connected to suction bulbs.  The linea alba was closed with a running suture of #1 PDS and skin closed with skin staples.  Clean honeycomb bandage was placed and the patient was taken to PACU in stable condition.  EBL 150 cc.  Counts correct.  Complications none.     Edsel Petrin. Dalbert Batman, M.D., FACS General and Minimally Invasive Surgery Breast and Colorectal Surgery  04/22/2018 3:01 PM

## 2018-04-22 NOTE — Anesthesia Procedure Notes (Signed)
Procedure Name: Intubation Date/Time: 04/22/2018 12:54 PM Performed by: Barrington Ellison, CRNA Pre-anesthesia Checklist: Patient identified, Emergency Drugs available, Suction available and Patient being monitored Patient Re-evaluated:Patient Re-evaluated prior to induction Oxygen Delivery Method: Circle System Utilized Preoxygenation: Pre-oxygenation with 100% oxygen Induction Type: IV induction Ventilation: Mask ventilation without difficulty Laryngoscope Size: Mac and 3 Grade View: Grade I Tube type: Oral Tube size: 7.0 mm Number of attempts: 1 Airway Equipment and Method: Stylet and Oral airway Placement Confirmation: ETT inserted through vocal cords under direct vision,  positive ETCO2 and breath sounds checked- equal and bilateral Secured at: 21 cm Tube secured with: Tape Dental Injury: Teeth and Oropharynx as per pre-operative assessment

## 2018-04-22 NOTE — Interval H&P Note (Signed)
History and Physical Interval Note:  04/22/2018 12:08 PM  [name]  has presented today for surgery, with the diagnosis of incisional hernia  The various methods of treatment have been discussed with the patient and family. After consideration of risks, benefits and other options for treatment, the patient has consented to  Procedure(s): [place] (N/A) INSERTION OF MESH (N/A) as a surgical intervention .  The patient's history has been reviewed, patient examined, no change in status, stable for surgery.  I have reviewed the patient's chart and labs.  Questions were answered to the patient's satisfaction.     [name]

## 2018-04-23 ENCOUNTER — Encounter (HOSPITAL_COMMUNITY): Payer: Self-pay | Admitting: General Surgery

## 2018-04-23 DIAGNOSIS — N189 Chronic kidney disease, unspecified: Secondary | ICD-10-CM | POA: Diagnosis not present

## 2018-04-23 DIAGNOSIS — I129 Hypertensive chronic kidney disease with stage 1 through stage 4 chronic kidney disease, or unspecified chronic kidney disease: Secondary | ICD-10-CM | POA: Diagnosis not present

## 2018-04-23 DIAGNOSIS — E039 Hypothyroidism, unspecified: Secondary | ICD-10-CM | POA: Diagnosis not present

## 2018-04-23 DIAGNOSIS — R262 Difficulty in walking, not elsewhere classified: Secondary | ICD-10-CM | POA: Diagnosis not present

## 2018-04-23 DIAGNOSIS — D62 Acute posthemorrhagic anemia: Secondary | ICD-10-CM | POA: Diagnosis not present

## 2018-04-23 DIAGNOSIS — K432 Incisional hernia without obstruction or gangrene: Secondary | ICD-10-CM | POA: Diagnosis not present

## 2018-04-23 LAB — BASIC METABOLIC PANEL
ANION GAP: 7 (ref 5–15)
BUN: 13 mg/dL (ref 8–23)
CO2: 24 mmol/L (ref 22–32)
Calcium: 7.4 mg/dL — ABNORMAL LOW (ref 8.9–10.3)
Chloride: 107 mmol/L (ref 98–111)
Creatinine, Ser: 0.8 mg/dL (ref 0.44–1.00)
GFR calc Af Amer: >60 mL/min (ref >60)
GLUCOSE: 99 mg/dL (ref 70–99)
Potassium: 4 mmol/L (ref 3.5–5.1)
SODIUM: 138 mmol/L (ref 135–145)

## 2018-04-23 LAB — CBC
HCT: 29 % — ABNORMAL LOW (ref 36.0–46.0)
Hemoglobin: 8.8 g/dL — ABNORMAL LOW (ref 12.0–15.0)
MCH: 28.8 pg (ref 26.0–34.0)
MCHC: 30.3 g/dL (ref 30.0–36.0)
MCV: 94.8 fL (ref 78.0–100.0)
PLATELETS: 160 10*3/uL (ref 150–400)
RBC: 3.06 MIL/uL — ABNORMAL LOW (ref 3.87–5.11)
RDW: 13.4 % (ref 11.5–15.5)
WBC: 10 10*3/uL (ref 4.0–10.5)

## 2018-04-23 NOTE — Evaluation (Signed)
Physical Therapy Evaluation Patient Details Name: [name] MRN: [id number] DOB: Aug 31, 1935 Today's Date: 04/23/2018   History of Present Illness  83 year old female who is brought to the operating room electively for repair of her incisional hernia. February 07, 2017 she underwent laparoscopic takedown of splenic flexure left colectomy with primary handsewn anastomosis for benign diverticulitis with stricture and intramural abscess.She also has a history of abdominal hysterectomy and hiatal hernia repair she now has a several month history of some left-sided abdominal discomfort and a bulge and desired hiatal hernia repair. s/p 8/19 hernia repair  Clinical Impression  Patient is s/p above surgery resulting in functional limitations due to the deficits listed below (see PT Problem List). Pt is currently limited in safe mobility by decreased strength and balance, and decreased safety awareness. Pt currently requires min guard for bed mobility, minA for ambulation of 250 feet. Pt started ambulation without AD and was safer with HHA. Pt ascend/descend 15 stairs with modA for step over step pattern and minA for step to pattern with both hands on hand rail. PT recommends HHPT level rehab at d/c to improve strength and balance to improve safe mobility especially with stairs. PT will continue to follow acutely.       Follow Up Recommendations Home health PT;Supervision for mobility/OOB    Equipment Recommendations  None recommended by PT       Precautions / Restrictions Precautions Precautions: Fall Precaution Comments: fell in last month injuring R shoulder and L knee Required Braces or Orthoses: Other Brace/Splint(Abdominal binder) Restrictions Weight Bearing Restrictions: No      Mobility  Bed Mobility               General bed mobility comments: reclined in bed, with LE out of bed and HoB elevated, requires increased effort to come to seated EoB  Transfers Overall transfer level:  Needs assistance Equipment used: None Transfers: Sit to/from Stand Sit to Stand: Min guard         General transfer comment: min guard for safety increased time and effort due to decreased trunk flexion   Ambulation/Gait Ambulation/Gait assistance: Min guard;Min assist Gait Distance (Feet): 250 Feet Assistive device: None;1 person hand held assist Gait Pattern/deviations: Step-through pattern;Decreased stride length;Shuffle;Drifts right/left Gait velocity: slowed Gait velocity interpretation: <1.8 ft/sec, indicate of risk for recurrent falls General Gait Details: pt declined RW use, initially min guard for safety, drifting right and left and reaching out for furniture and hand rails in hall for steadying, ambulating back from stairwell pt requires HHA and minA for steadying with gait   Stairs Stairs: Yes Stairs assistance: Min assist;Mod assist Stair Management: One rail Right;One rail Left;Forwards;Alternating pattern;Step to pattern Number of Stairs: 15 General stair comments: mod A for step over step ascent, vc for step to ascent which required light minA, minA with both hands on rail to descend stairs     Balance Overall balance assessment: Needs assistance Sitting-balance support: Feet supported;No upper extremity supported Sitting balance-Leahy Scale: Good     Standing balance support: No upper extremity supported;During functional activity Standing balance-Leahy Scale: Fair                               Pertinent Vitals/Pain Pain Assessment: 0-10 Pain Score: 7  Pain Location: abdomen Pain Descriptors / Indicators: Grimacing;Guarding;Operative site guarding;Aching;Burning Pain Intervention(s): Limited activity within patient's tolerance;Monitored during session;Repositioned    Home Living Family/patient expects to be discharged to::  Private residence Living Arrangements: Children Available Help at Discharge: Family;Available 24 hours/day Type of  Home: House Home Access: Level entry     Home Layout: Multi-level Home Equipment: Walker - 2 wheels;Cane - single point      Prior Function Level of Independence: Independent with assistive device(s)(used cane for ambulation in community, and grocery cart)                  Extremity/Trunk Assessment   Upper Extremity Assessment Upper Extremity Assessment: RUE deficits/detail;LUE deficits/detail RUE Deficits / Details: R shoulder injured in a fall, lacking full ROM, strength grossly 2/5 RUE Coordination: decreased fine motor    Lower Extremity Assessment Lower Extremity Assessment: LLE deficits/detail;Generalized weakness LLE Deficits / Details: L knee injured in fall, lacks full ROM, heel spur on L heel, has shoes that mitigate pain, strength grossly 2+/5 in L LE LLE Coordination: decreased fine motor       Communication   Communication: No difficulties  Cognition Arousal/Alertness: Awake/alert Behavior During Therapy: WFL for tasks assessed/performed Overall Cognitive Status: Within Functional Limits for tasks assessed                                        General Comments General comments (skin integrity, edema, etc.): VSS, daughter present during session         Assessment/Plan    PT Assessment Patient needs continued PT services  PT Problem List Decreased strength;Decreased range of motion;Decreased activity tolerance;Decreased balance;Decreased mobility;Decreased coordination;Decreased safety awareness;Decreased knowledge of use of DME;Pain       PT Treatment Interventions DME instruction;Gait training;Stair training;Functional mobility training;Therapeutic activities;Therapeutic exercise;Balance training;Cognitive remediation;Patient/family education    PT Goals (Current goals can be found in the Care Plan section)  Acute Rehab PT Goals Patient Stated Goal: go home and sit in recliner  PT Goal Formulation: With patient/family Time For  Goal Achievement: 05/07/18 Potential to Achieve Goals: Fair    Frequency Min 3X/week    AM-PAC PT "6 Clicks" Daily Activity  Outcome Measure Difficulty turning over in bed (including adjusting bedclothes, sheets and blankets)?: A Little Difficulty moving from lying on back to sitting on the side of the bed? : Unable Difficulty sitting down on and standing up from a chair with arms (e.g., wheelchair, bedside commode, etc,.)?: Unable Help needed moving to and from a bed to chair (including a wheelchair)?: A Little Help needed walking in hospital room?: A Little Help needed climbing 3-5 steps with a railing? : A Little 6 Click Score: 14    End of Session Equipment Utilized During Treatment: Gait belt Activity Tolerance: Patient limited by pain;Patient limited by fatigue Patient left: in chair;with call bell/phone within reach;with chair alarm set;with family/visitor present Nurse Communication: Mobility status;Other (comment)(restart IV) PT Visit Diagnosis: Unsteadiness on feet (R26.81);Repeated falls (R29.6);Other abnormalities of gait and mobility (R26.89);Muscle weakness (generalized) (M62.81);Difficulty in walking, not elsewhere classified (R26.2);Pain Pain - part of body: (abdominal )    Time: 2094-7096 PT Time Calculation (min) (ACUTE ONLY): 28 min   Charges:   PT Evaluation $PT Eval Moderate Complexity: 1 Mod PT Treatments $Gait Training: 8-22 mins        Chellsie Gomer B. Migdalia Dk PT, DPT Acute Rehabilitation  [phone] Pager [phone]    [place] 04/23/2018, 12:55 PM

## 2018-04-23 NOTE — Care Management Note (Signed)
Case Management Note  Patient Details  Name: [name] MRN: [id number] Date of Birth: 12/28/1934  Subjective/Objective:                    Action/Plan:  Social work and Tourist information centre manager both following for discharge needs. Await PT recommendations. Expected Discharge Date:                  Expected Discharge Plan:     In-House Referral:  Clinical Social Work  Discharge planning Services  CM Consult  Post Acute Care Choice:  Durable Medical Equipment, Home Health Choice offered to:     DME Arranged:    DME Agency:     HH Arranged:    [place] Agency:     Status of Service:  In process, will continue to follow  If discussed at Long Length of Stay Meetings, dates discussed:    Additional Comments:  Marilu Favre, RN 04/23/2018, 10:20 AM

## 2018-04-23 NOTE — Progress Notes (Signed)
1 Day Post-Op  Subjective: Alert and stable. Good urine output.  Normotensive.  No tachycardia. Slept fairly well last night.  Incisional pain seems appropriate Looks pretty good considering age and magnitude of surgery.  Moderate serosanguineous drainage.  Some of this may be irrigation fluid but some may be slow oozing from raw surfaces. Hemoglobin 10.7.  Was 12.6 preop.  Other lab work looks fine. 2Objective: Vital signs in last 24 hours: Temp:  [97.6 F (36.4 C)-99.6 F (37.6 C)] 99.6 F (37.6 C) (08/20 0611) Pulse Rate:  [60-99] 99 (08/20 0611) Resp:  [8-20] 20 (08/20 0611) BP: (137-192)/(56-95) 140/72 (08/20 0611) SpO2:  [96 %-100 %] 99 % (08/20 0611) Weight:  [72.6 kg] 72.6 kg (08/19 1144) Last BM Date: 04/21/18  Intake/Output from previous day: 08/19 0701 - 08/20 0700 In: 2432.9 [P.O.:240; I.V.:2192.9] Out: 7564 [Urine:1200; Drains:440; Blood:125] Intake/Output this shift: Total I/O In: 1182.9 [P.O.:240; I.V.:942.9] Out: 1400 [Urine:1200; Drains:200]   EXAM: General appearance: Alert.  Oriented.  Family members in room.  Mental status seems normal Resp: clear to auscultation bilaterally GI: Soft.  Appropriately tender.  Not distended.  Wound clean.  Serosanguineous drainage from bilateral drains.  Total out postop 440 cc. Extremities: extremities normal, atraumatic, no cyanosis or edema  Lab Results:  Results for orders placed or performed during the hospital encounter of 04/22/18 (from the past 24 hour(s))  CBC     Status: Abnormal   Collection Time: 04/22/18  6:29 PM  Result Value Ref Range   WBC 15.8 (H) 4.0 - 10.5 K/uL   RBC 3.75 (L) 3.87 - 5.11 MIL/uL   Hemoglobin 10.7 (L) 12.0 - 15.0 g/dL   HCT 35.3 (L) 36.0 - 46.0 %   MCV 94.1 78.0 - 100.0 fL   MCH 28.5 26.0 - 34.0 pg   MCHC 30.3 30.0 - 36.0 g/dL   RDW 13.3 11.5 - 15.5 %   Platelets 205 150 - 400 K/uL  Creatinine, serum     Status: None   Collection Time: 04/22/18  6:29 PM  Result Value Ref Range    Creatinine, Ser 0.79 0.44 - 1.00 mg/dL   GFR calc non Af Amer >60 >60 mL/min   GFR calc Af Amer >60 >60 mL/min     Studies/Results: No results found.  . calcium-vitamin D  1 tablet Oral Daily  . enoxaparin (LOVENOX) injection  40 mg Subcutaneous Q24H  . gabapentin  300 mg Oral BID  . latanoprost  1 drop Both Eyes QHS  . levothyroxine  88 mcg Oral QAC breakfast  . multivitamin with minerals  1 tablet Oral Daily  . pantoprazole  40 mg Oral Daily  . pravastatin  20 mg Oral q morning - 10a  . senna  1 tablet Oral BID     Assessment/Plan: s/p Procedure(s): OPEN REPAIR INCISIONAL HERNIA ERAS PATHWAY INSERTION OF MESH  POD #1.  Open repair multiple ventral incisional hernias with retrorectus repair with mesh Condition quite satisfactory Moderate to high serosanguineous drainage.  Suspect this will subside Clear liquid diet Ambulate PT consult Lab work tomorrow  History colectomy for diverticulitis History abdominal hysterectomy History repair hiatal hernia History left breast cancer Mild memory disorder CKD Hypothyroidism-Synthroid  @PROBHOSP @  LOS: 0 days    [name] M [name] 04/23/2018  . .prob

## 2018-04-23 NOTE — Care Management Note (Signed)
Case Management Note  Patient Details  Name: [name] MRN: [id number] Date of Birth: 1934-11-12  Subjective/Objective:                    Action/Plan: Spoke to patient and daughter [name] [phone] at bedside. Confirmed face sheet information . Patient and daughter live together . Patient can use front door that has 3 steps, back door is a flight of steps.   Referral given to [facility] with [facility].  Expected Discharge Date:                  Expected Discharge Plan:  [place]  In-House Referral:  NA  Discharge planning Services  CM Consult  Post Acute Care Choice:  Durable Medical Equipment, Home Health Choice offered to:  Patient, Adult Children  DME Arranged:  N/A DME Agency:  NA  HH Arranged:  PT, RN [place] Agency:  [place]  Status of Service:  Completed, signed off  If discussed at [place] of Stay Meetings, dates discussed:    Additional Comments:  Marilu Favre, RN 04/23/2018, 2:21 PM

## 2018-04-24 DIAGNOSIS — N189 Chronic kidney disease, unspecified: Secondary | ICD-10-CM | POA: Diagnosis not present

## 2018-04-24 DIAGNOSIS — E039 Hypothyroidism, unspecified: Secondary | ICD-10-CM | POA: Diagnosis not present

## 2018-04-24 DIAGNOSIS — K432 Incisional hernia without obstruction or gangrene: Secondary | ICD-10-CM | POA: Diagnosis not present

## 2018-04-24 DIAGNOSIS — I129 Hypertensive chronic kidney disease with stage 1 through stage 4 chronic kidney disease, or unspecified chronic kidney disease: Secondary | ICD-10-CM | POA: Diagnosis not present

## 2018-04-24 DIAGNOSIS — R262 Difficulty in walking, not elsewhere classified: Secondary | ICD-10-CM | POA: Diagnosis not present

## 2018-04-24 DIAGNOSIS — D62 Acute posthemorrhagic anemia: Secondary | ICD-10-CM | POA: Diagnosis not present

## 2018-04-24 LAB — BASIC METABOLIC PANEL
Anion gap: 3 — ABNORMAL LOW (ref 5–15)
BUN: 11 mg/dL (ref 8–23)
CALCIUM: 7.9 mg/dL — AB (ref 8.9–10.3)
CO2: 28 mmol/L (ref 22–32)
Chloride: 107 mmol/L (ref 98–111)
Creatinine, Ser: 0.82 mg/dL (ref 0.44–1.00)
GFR calc Af Amer: >60 mL/min (ref >60)
GLUCOSE: 115 mg/dL — AB (ref 70–99)
Potassium: 4 mmol/L (ref 3.5–5.1)
SODIUM: 138 mmol/L (ref 135–145)

## 2018-04-24 LAB — CBC
HCT: 27.8 % — ABNORMAL LOW (ref 36.0–46.0)
Hemoglobin: 8.5 g/dL — ABNORMAL LOW (ref 12.0–15.0)
MCH: 28.8 pg (ref 26.0–34.0)
MCHC: 30.6 g/dL (ref 30.0–36.0)
MCV: 94.2 fL (ref 78.0–100.0)
PLATELETS: 154 10*3/uL (ref 150–400)
RBC: 2.95 MIL/uL — ABNORMAL LOW (ref 3.87–5.11)
RDW: 13.5 % (ref 11.5–15.5)
WBC: 10.1 10*3/uL (ref 4.0–10.5)

## 2018-04-24 MED ORDER — FERROUS SULFATE 300 (60 FE) MG/5ML PO SYRP
300.0000 mg | ORAL_SOLUTION | Freq: Every day | ORAL | Status: DC
  Administered 2018-04-24 – 2018-04-26 (×3): 300 mg via ORAL
  Filled 2018-04-24 (×3): qty 5

## 2018-04-24 MED ORDER — ACETAMINOPHEN 325 MG PO TABS
650.0000 mg | ORAL_TABLET | Freq: Four times a day (QID) | ORAL | Status: DC | PRN
  Administered 2018-04-24 – 2018-04-25 (×2): 650 mg via ORAL
  Filled 2018-04-24 (×2): qty 2

## 2018-04-24 NOTE — Progress Notes (Addendum)
2 Days Post-Op  Subjective: Overall stable and alert.  Has been up to chair but does not ambulate much. Briefly confused last night but resolved quickly with oxygen supplementation Now alert oriented mental status normal Vital signs stable Tolerating clear liquids.  No stool or flatus JP drainage now thinner and much lower volume Hemoglobin 8.8 yesterday.  8.5 this morning. WBC 10.1. K 4.0. Creat 0.82.  Objective: Vital signs in last 24 hours: Temp:  [98.6 F (37 C)-99.2 F (37.3 C)] 98.6 F (37 C) (08/21 0542) Pulse Rate:  [79-116] 79 (08/21 0542) Resp:  [15-20] 18 (08/21 0542) BP: (97-121)/(60-103) 108/63 (08/21 0542) SpO2:  [88 %-100 %] 99 % (08/21 0542) Last BM Date: 04/21/18  Intake/Output from previous day: 08/20 0701 - 08/21 0700 In: 1037.3 [P.O.:120; I.V.:917.3] Out: 1725 [Urine:1500; Drains:225] Intake/Output this shift: Total I/O In: -  Out: 860 [Urine:800; Drains:60]   AM: General appearance: Alert.  Oriented.  Family members in room.  Mental status seems normal Resp: clear to auscultation bilaterally GI: Soft.  Appropriately tender.  Not distended.    Hypoactive bowel sounds.  Wound clean.  Serosanguineous drainage from bilateral drains.    Output thinner than yesterday.  Output 60 cc since 7 PM last night. Extremities: extremities normal, atraumatic, no cyanosis or edema   Lab Results:  Results for orders placed or performed during the hospital encounter of 04/22/18 (from the past 24 hour(s))  CBC     Status: Abnormal   Collection Time: 04/23/18  6:47 AM  Result Value Ref Range   WBC 10.0 4.0 - 10.5 K/uL   RBC 3.06 (L) 3.87 - 5.11 MIL/uL   Hemoglobin 8.8 (L) 12.0 - 15.0 g/dL   HCT 29.0 (L) 36.0 - 46.0 %   MCV 94.8 78.0 - 100.0 fL   MCH 28.8 26.0 - 34.0 pg   MCHC 30.3 30.0 - 36.0 g/dL   RDW 13.4 11.5 - 15.5 %   Platelets 160 150 - 400 K/uL  Basic metabolic panel     Status: Abnormal   Collection Time: 04/23/18  6:47 AM  Result Value Ref Range   Sodium 138 135 - 145 mmol/L   Potassium 4.0 3.5 - 5.1 mmol/L   Chloride 107 98 - 111 mmol/L   CO2 24 22 - 32 mmol/L   Glucose, Bld 99 70 - 99 mg/dL   BUN 13 8 - 23 mg/dL   Creatinine, Ser 0.80 0.44 - 1.00 mg/dL   Calcium 7.4 (L) 8.9 - 10.3 mg/dL   GFR calc non Af Amer >60 >60 mL/min   GFR calc Af Amer >60 >60 mL/min   Anion gap 7 5 - 15  CBC     Status: Abnormal   Collection Time: 04/24/18  5:28 AM  Result Value Ref Range   WBC 10.1 4.0 - 10.5 K/uL   RBC 2.95 (L) 3.87 - 5.11 MIL/uL   Hemoglobin 8.5 (L) 12.0 - 15.0 g/dL   HCT 27.8 (L) 36.0 - 46.0 %   MCV 94.2 78.0 - 100.0 fL   MCH 28.8 26.0 - 34.0 pg   MCHC 30.6 30.0 - 36.0 g/dL   RDW 13.5 11.5 - 15.5 %   Platelets 154 150 - 400 K/uL     Studies/Results: No results found.  . calcium-vitamin D  1 tablet Oral Daily  . gabapentin  300 mg Oral BID  . latanoprost  1 drop Both Eyes QHS  . levothyroxine  88 mcg Oral QAC breakfast  . multivitamin with  minerals  1 tablet Oral Daily  . pantoprazole  40 mg Oral Daily  . pravastatin  20 mg Oral q morning - 10a  . senna  1 tablet Oral BID     Assessment/Plan: s/p Procedure(s): OPEN REPAIR INCISIONAL HERNIA ERAS PATHWAY INSERTION OF MESH   POD #2.  Open repair multiple ventral incisional hernias with retrorectus repair with mesh Condition quite satisfactory DC Foley Increase mobilization Full liquid diet advance as tolerated Senokot twice daily Ambulate PT consult Probable HHPT  Acute blood loss anemia.  Clinically there is no active bleeding. Hgb stable. Start iron.   History colectomy for diverticulitis History abdominal hysterectomy History repair hiatal hernia History left breast cancer Mild memory disorder CKD Hypothyroidism-Synthroid   @PROBHOSP @  LOS: 0 days    Adin Hector 04/24/2018  . .prob

## 2018-04-24 NOTE — Progress Notes (Signed)
Physical Therapy Treatment Patient Details Name: [name] MRN: [id number] DOB: 12-14-1934 Today's Date: 04/24/2018    History of Present Illness 83 year old female who is brought to the operating room electively for repair of her incisional hernia. February 07, 2017 she underwent laparoscopic takedown of splenic flexure left colectomy with primary handsewn anastomosis for benign diverticulitis with stricture and intramural abscess.She also has a history of abdominal hysterectomy and hiatal hernia repair she now has a several month history of some left-sided abdominal discomfort and a bulge and desired hiatal hernia repair. s/p 8/19 hernia repair    PT Comments    Pt playing cards with daughter, seated in recliner upon entry, agreeable to PT session. Pt reports AMB with nursing about 1 hour prior to entry. Pt AMB in hall, mildly unsteady, performance still warrants use of RW for safety. Pt unable to compete full lab, arrested at 80% completion with sudden severe HA and weakness in legs, then able to AMB back to room after 2 minutes seated rest. HR: 120s, regular as established at Left Radial Pulse. NoHA prior to entry, and none earlier in day with AMB. RN notified. Pt asking to get to bathroom to void at end of session, daughter assisting, as previously throughout day. Recommend more closely monitored vitals during next session.   Follow Up Recommendations  Home health PT;Supervision for mobility/OOB     Equipment Recommendations  None recommended by PT    Recommendations for Other Services       Precautions / Restrictions Precautions Precautions: Fall Precaution Comments: fell in last month injuring R shoulder and L knee Required Braces or Orthoses: Other Brace/Splint    Mobility  Bed Mobility               General bed mobility comments: in chair at arrival   Transfers Overall transfer level: Needs assistance Equipment used: None Transfers: Sit to/from Stand Sit to Stand:  Min guard         General transfer comment: slow labored, appears unsteady albeit not gross LOB or instability observed   Ambulation/Gait   Gait Distance (Feet): 425 Feet(46ft, seated rest, 153ft) Assistive device: None;1 person hand held assist(should still be using a RW for sure, especially longer distances. ) Gait Pattern/deviations: Step-through pattern;Decreased stride length;Shuffle;Staggering right(intermittent drift toward the right, worse with fatigue. ) Gait velocity: 0.33m/s Gait velocity interpretation: <1.31 ft/sec, indicative of household ambulator General Gait Details: c/o significant weaness in legs and fairly severe HA after 478ft, asked to sit and rest, nioted tachycardic >120bpm. SOB throughout AMB. AMB back to room after 2 minutes seated rest.    Stairs             Wheelchair Mobility    Modified Rankin (Stroke Patients Only)       Balance Overall balance assessment: Needs assistance Sitting-balance support: No upper extremity supported;Feet supported Sitting balance-Leahy Scale: Good     Standing balance support: Single extremity supported;During functional activity Standing balance-Leahy Scale: Poor                              Cognition Arousal/Alertness: Awake/alert Behavior During Therapy: WFL for tasks assessed/performed Overall Cognitive Status: Within Functional Limits for tasks assessed                                        Exercises  General Comments        Pertinent Vitals/Pain Pain Assessment: 0-10 Pain Score: 5  Pain Location: ABD Pain Descriptors / Indicators: Grimacing;Guarding;Operative site guarding;Aching;Burning Pain Intervention(s): Limited activity within patient's tolerance    Home Living                      Prior Function            PT Goals (current goals can now be found in the care plan section) Acute Rehab PT Goals Patient Stated Goal: go home and sit in  recliner  PT Goal Formulation: With patient/family Time For Goal Achievement: 05/07/18 Potential to Achieve Goals: Fair Progress towards PT goals: Progressing toward goals    Frequency    Min 3X/week      PT Plan Current plan remains appropriate    Co-evaluation              AM-PAC PT "6 Clicks" Daily Activity  Outcome Measure  Difficulty turning over in bed (including adjusting bedclothes, sheets and blankets)?: A Lot Difficulty moving from lying on back to sitting on the side of the bed? : Unable Difficulty sitting down on and standing up from a chair with arms (e.g., wheelchair, bedside commode, etc,.)?: Unable Help needed moving to and from a bed to chair (including a wheelchair)?: A Lot Help needed walking in hospital room?: A Lot Help needed climbing 3-5 steps with a railing? : A Lot 6 Click Score: 10    End of Session   Activity Tolerance: Patient limited by fatigue;Treatment limited secondary to medical complications (Comment)(sudden onset HA 324ft into AMB, not present during AMB with RN 1 hour prior. ) Patient left: with call bell/phone within reach;with family/visitor present(on toilet) Nurse Communication: Other (comment)(HA with AMB, tachycardia. ) PT Visit Diagnosis: Unsteadiness on feet (R26.81);Repeated falls (R29.6);Other abnormalities of gait and mobility (R26.89);Muscle weakness (generalized) (M62.81);Difficulty in walking, not elsewhere classified (R26.2);Pain Pain - part of body: (ABD)     Time: 7564-3329 PT Time Calculation (min) (ACUTE ONLY): 12 min  Charges:  $Therapeutic Activity: 8-22 mins                     3:54 PM, 04/24/18 Etta Grandchild, PT, DPT Physical Therapist - [place] [phone] (Pager)  [phone] (Office)      Buccola,Allan C 04/24/2018, 3:51 PM

## 2018-04-24 NOTE — Progress Notes (Signed)
Foley catheter removed this morning at 0900hrs. Pt due to void

## 2018-04-24 NOTE — Progress Notes (Signed)
Pt woke up confused. O2 sat at RA was 87. Put 2L [place] went up to 97%, HR 106-116.  Encourage the use of incentive spirometer. Re orient pt successfully. Daughter at bedside. Bed alarm turned on. Will continue to monitor pt.

## 2018-04-24 NOTE — Progress Notes (Signed)
Pt ambulated half a lap on unit. Back in room and sitting up in chair

## 2018-04-24 NOTE — Progress Notes (Signed)
Pt has voided this pm since foley removal

## 2018-04-25 DIAGNOSIS — K432 Incisional hernia without obstruction or gangrene: Secondary | ICD-10-CM | POA: Diagnosis not present

## 2018-04-25 DIAGNOSIS — N189 Chronic kidney disease, unspecified: Secondary | ICD-10-CM | POA: Diagnosis not present

## 2018-04-25 DIAGNOSIS — D62 Acute posthemorrhagic anemia: Secondary | ICD-10-CM | POA: Diagnosis not present

## 2018-04-25 DIAGNOSIS — I129 Hypertensive chronic kidney disease with stage 1 through stage 4 chronic kidney disease, or unspecified chronic kidney disease: Secondary | ICD-10-CM | POA: Diagnosis not present

## 2018-04-25 DIAGNOSIS — E039 Hypothyroidism, unspecified: Secondary | ICD-10-CM | POA: Diagnosis not present

## 2018-04-25 DIAGNOSIS — R262 Difficulty in walking, not elsewhere classified: Secondary | ICD-10-CM | POA: Diagnosis not present

## 2018-04-25 LAB — CBC
HCT: 28.1 % — ABNORMAL LOW (ref 36.0–46.0)
Hemoglobin: 8.6 g/dL — ABNORMAL LOW (ref 12.0–15.0)
MCH: 28.8 pg (ref 26.0–34.0)
MCHC: 30.6 g/dL (ref 30.0–36.0)
MCV: 94 fL (ref 78.0–100.0)
PLATELETS: 174 10*3/uL (ref 150–400)
RBC: 2.99 MIL/uL — AB (ref 3.87–5.11)
RDW: 13.2 % (ref 11.5–15.5)
WBC: 10.1 10*3/uL (ref 4.0–10.5)

## 2018-04-25 NOTE — Progress Notes (Signed)
3 Days Post-Op  Subjective: Stable and alert. VSS.  Afebrile Complains of headache which is a chronic problem Mild cognitive impairment with poor memory for events noted.  Otherwise pleasant.  No agitation.  Cooperative.  Family member in room  Tolerating full liquid diet.  Had a bowel movement yesterday.  Ambulated in the hall yesterday. Seems stable  Physical therapy has evaluated and recommends home health PT but no equipment needs.  Lab work pending this morning JP drainage very thin serosanguineous.  Low volume now  Objective: Vital signs in last 24 hours: Temp:  [98.1 F (36.7 C)-99 F (37.2 C)] 98.1 F (36.7 C) (08/22 0518) Pulse Rate:  [93-123] 105 (08/22 0518) Resp:  [18] 18 (08/22 0518) BP: (110-134)/(59-88) 134/88 (08/22 0518) SpO2:  [91 %-95 %] 91 % (08/22 0518) Last BM Date: 04/24/18  Intake/Output from previous day: 08/21 0701 - 08/22 0700 In: 250 [P.O.:250] Out: 410 [Urine:350; Drains:60] Intake/Output this shift: No intake/output data recorded.   EXAM: General appearance:Alert. Oriented. Family members in room. Mental status seems normal Resp:clear to auscultation bilaterally GG:EZMO. Appropriately tender. Not distended.   Hypoactive bowel sounds.  Wound clean.  Dressing changed by me this morning. Serosanguineous drainage from bilateral drains.  Output thinner than yesterday.   extremities normal, atraumatic, no cyanosis or edema Neuropsychiatric: Alert.  Cooperative.  Mild anxiety.  Some memory problems.  Relies on family member about details of the last 24 hours.  Cognitive impairment seems to be at baseline.   Lab Results:  No results found for this or any previous visit (from the past 24 hour(s)).   Studies/Results: No results found.  . calcium-vitamin D  1 tablet Oral Daily  . ferrous sulfate  300 mg Oral Q breakfast  . gabapentin  300 mg Oral BID  . latanoprost  1 drop Both Eyes QHS  . levothyroxine  88 mcg Oral QAC breakfast   . multivitamin with minerals  1 tablet Oral Daily  . pantoprazole  40 mg Oral Daily  . pravastatin  20 mg Oral q morning - 10a  . senna  1 tablet Oral BID     Assessment/Plan: s/p Procedure(s): OPEN REPAIR INCISIONAL HERNIA ERAS PATHWAY INSERTION OF MESH   POD #3. Open repair multiple ventral incisional hernias with retrorectus repair with mesh Condition  Satisfactory, although somewhat feeble. Advance to soft diet IV at [place] now Increase mobilization Sen[place]ot twice daily PT consult-continue.Marland KitchenMarland KitchenPT recommends HH PT but no equipment needs. HHPT requested with face-to-face encounter order set She needs 1 more day in the hospital due to disability Home tomorrow.  Family to plan  Acute blood loss anemia.  Clinically there is no active bleeding. Hgb stable. Start iron.   History colectomy for diverticulitis History abdominal hysterectomy History repair hiatal hernia History left breast cancer Mild memory disorder-seems to be at baseline.  Only Tylenol for pain with tramadol as back-up CKD-creatinine 0.82 yesterday. Hypothyroidism-Synthroid   @PROBHOSP @  LOS: 0 days    [name] 04/25/2018  . .prob

## 2018-04-25 NOTE — Progress Notes (Signed)
Physical Therapy Treatment Patient Details Name: [name] MRN: [id number] DOB: 08/17/1935 Today's Date: 04/25/2018    History of Present Illness 83 year old female who is brought to the operating room electively for repair of her incisional hernia. February 07, 2017 she underwent laparoscopic takedown of splenic flexure left colectomy with primary handsewn anastomosis for benign diverticulitis with stricture and intramural abscess.She also has a history of abdominal hysterectomy and hiatal hernia repair she now has a several month history of some left-sided abdominal discomfort and a bulge and desired hiatal hernia repair. s/p 8/19 hernia repair    PT Comments    Pt progressing activity and mobility during session this pm.  Able to perform increased distance and negotiate flight of stairs for simulated entry into her home and bedroom.  Pt with DOE as gait progressed.  SPo2 97%, HR 107 bpm.  Continue to recommend f/u HHPT to improve endurance, strength and function.  Pt remains unsteady with ambulation and plan to trial cane vs. RW next session.    Follow Up Recommendations  Home health PT;Supervision for mobility/OOB     Equipment Recommendations  None recommended by PT    Recommendations for Other Services       Precautions / Restrictions Precautions Precautions: Fall Precaution Comments: fell in last month injuring R shoulder and L knee Required Braces or Orthoses: Other Brace/Splint Restrictions Weight Bearing Restrictions: No    Mobility  Bed Mobility               General bed mobility comments: in chair at arrival   Transfers Overall transfer level: Needs assistance Equipment used: None Transfers: Sit to/from Stand Sit to Stand: Min guard         General transfer comment: Pt mildly unsteady during standing reaching for PTA's hands for support and balance.    Ambulation/Gait Ambulation/Gait assistance: Min guard;Min assist Gait Distance (Feet): 450  Feet Assistive device: 1 person hand held assist Gait Pattern/deviations: Step-through pattern;Decreased stride length;Shuffle;Staggering right     General Gait Details: Pt is mildly unsteady and reliant on HHA during gait training.  Cues for R foot clearance and upper trunk control.     Stairs Stairs: Yes Stairs assistance: Min assist Stair Management: One rail Left;Forwards;Step to pattern Number of Stairs: 12 General stair comments: Cues for sequencing and use of hand rail , Used hha on opposite to simulate B rail use.     Wheelchair Mobility    Modified Rankin (Stroke Patients Only)       Balance Overall balance assessment: Needs assistance Sitting-balance support: No upper extremity supported;Feet supported Sitting balance-Leahy Scale: Good       Standing balance-Leahy Scale: Poor                              Cognition Arousal/Alertness: Awake/alert Behavior During Therapy: WFL for tasks assessed/performed Overall Cognitive Status: Within Functional Limits for tasks assessed                                        Exercises      General Comments        Pertinent Vitals/Pain Pain Assessment: 0-10 Pain Score: 5  Pain Location: ABD Pain Descriptors / Indicators: Grimacing;Guarding;Operative site guarding;Aching;Burning Pain Intervention(s): Monitored during session;Repositioned;Ice applied    Home Living  Prior Function            PT Goals (current goals can now be found in the care plan section) Acute Rehab PT Goals Patient Stated Goal: go home and sit in recliner  Potential to Achieve Goals: Fair Progress towards PT goals: Progressing toward goals    Frequency    Min 3X/week      PT Plan Current plan remains appropriate    Co-evaluation              AM-PAC PT "6 Clicks" Daily Activity  Outcome Measure  Difficulty turning over in bed (including adjusting bedclothes, sheets  and blankets)?: A Lot Difficulty moving from lying on back to sitting on the side of the bed? : Unable Difficulty sitting down on and standing up from a chair with arms (e.g., wheelchair, bedside commode, etc,.)?: Unable Help needed moving to and from a bed to chair (including a wheelchair)?: A Lot Help needed walking in hospital room?: A Lot Help needed climbing 3-5 steps with a railing? : A Lot 6 Click Score: 10    End of Session Equipment Utilized During Treatment: Gait belt Activity Tolerance: Patient limited by fatigue;Treatment limited secondary to medical complications (Comment) Patient left: with call bell/phone within reach;with family/visitor present;in chair Nurse Communication: Mobility status(drain leaking, Iv leaking.) PT Visit Diagnosis: Unsteadiness on feet (R26.81);Repeated falls (R29.6);Other abnormalities of gait and mobility (R26.89);Muscle weakness (generalized) (M62.81);Difficulty in walking, not elsewhere classified (R26.2);Pain Pain - part of body: (ABD)     Time: 4540-9811 PT Time Calculation (min) (ACUTE ONLY): 23 min  Charges:  $Gait Training: 8-22 mins $Therapeutic Activity: 8-22 mins                     Governor Rooks, PTA pager [phone]    Cristela Blue 04/25/2018, 5:55 PM

## 2018-04-25 NOTE — Progress Notes (Signed)
Please see CM consult note from 04/23/2018. Patient is arranged with [place] for [facility] services. Mindi Slicker [facility] [phone]

## 2018-04-26 DIAGNOSIS — R262 Difficulty in walking, not elsewhere classified: Secondary | ICD-10-CM | POA: Diagnosis not present

## 2018-04-26 DIAGNOSIS — K432 Incisional hernia without obstruction or gangrene: Secondary | ICD-10-CM | POA: Diagnosis not present

## 2018-04-26 DIAGNOSIS — I129 Hypertensive chronic kidney disease with stage 1 through stage 4 chronic kidney disease, or unspecified chronic kidney disease: Secondary | ICD-10-CM | POA: Diagnosis not present

## 2018-04-26 DIAGNOSIS — D62 Acute posthemorrhagic anemia: Secondary | ICD-10-CM | POA: Diagnosis not present

## 2018-04-26 DIAGNOSIS — N189 Chronic kidney disease, unspecified: Secondary | ICD-10-CM | POA: Diagnosis not present

## 2018-04-26 DIAGNOSIS — E039 Hypothyroidism, unspecified: Secondary | ICD-10-CM | POA: Diagnosis not present

## 2018-04-26 MED ORDER — FERROUS SULFATE 300 (60 FE) MG/5ML PO SYRP: 300.0000 mg | ORAL_SOLUTION | Freq: Every day | ORAL | 3 refills | Status: DC

## 2018-04-26 MED ORDER — TRAMADOL HCL 50 MG PO TABS: 50.0000 mg | ORAL_TABLET | Freq: Four times a day (QID) | ORAL | 0 refills | Status: DC | PRN

## 2018-04-26 NOTE — Progress Notes (Signed)
Patient given discharge instructions. Patient verbalized understanding. Patient left unit in stable condition via wheelchair.

## 2018-04-26 NOTE — Discharge Instructions (Signed)
-  see above 

## 2018-04-26 NOTE — Discharge Summary (Addendum)
Patient ID: [name] [id number] 82 y.o. 1935/06/02  Admit date: 04/22/2018  Discharge date and time: 04/26/2018  Admitting Physician: Adin Hector  Discharge Physician: Adin Hector  Admission Diagnoses: incisional hernia  Discharge Diagnoses: Ventral incisional hernia                                         Memory disorder                                         Acute blood loss anemia                                         Chronic anemia                                          History of left breast cancer                                          History colectomy for diverticulitis                                          History surgical repair hiatal hernia                                          Hypothyroidism  Operations: Procedure(s): OPEN REPAIR INCISIONAL HERNIA ERAS PATHWAY INSERTION OF MESH  Admission Condition: fair  Discharged Condition: fair  Indication for Admission: This is an 83 year old female who is brought to the operating room electively for repair of her incisional hernia. [name] is her PCP  Dr. Gala Romney is her gastroenterologist. On February 07, 2017 she underwent laparoscopic takedown of splenic flexure left colectomy with primary handsewn anastomosis for benign diverticulitis with stricture and intramural abscess. She had a blood transfusion in the hospital she's done well since that surgery. She also has a history of abdominal hysterectomy and hiatal hernia repair she now has a several month history of some left-sided abdominal discomfort and a bulge. This bothers her and she wants the hernia repaired. Never incarcerated or obstructed. She has a mild memory disorder although not profound. CT scan shows incisional hernia to the left of the umbilicus. Baseball-sized defect with nonobstructed colon in the hernia. There is a tiny, 2-3 cm.  midline hernia in the epigastrium. No intra-abdominal  abnormalities. Comorbiditiesinclude thyroidectomy on Synthroid. Hiatal hernia repair. Left colectomy by me for diverticulitis. Abdominal hysterectomy. Heart murmur but no cardiac problems or symptoms. Hypertension. GERD. Recent fall without major injury. History left breast cancer. History of anemia. Mild memory disorder. CKD. She wants to have this repaired and I think that is reasonable. This will need to be an open repair, retrorectus or possibly TAR. She agrees with this plan.  Hospital Course: On  the day of admission the patient was taken to the operating room and underwent open repair of ventral incisional hernia.  This was a Runner, broadcasting/film/video with composite mesh placed between the posterior rectus muscle and the peritoneum.     Operative findings are  The larger defect in the periumbilical area was about 6 or 7 cm in size.  The smaller defect was about 3 cm in size and was below the xiphoid process in the midline.  Both defects were incorporated into the repair.  Both rectus muscles were completely intact, however.  I was able to mobilize the posterior rectus sheath and the peritoneum and close it in the midline primarily.  The mesh fixation sutures inferiorly were at the symphysis pubis and superiorly were at the costal margin and xiphoid.  Laterally they were in the anterior axillary line.     Postoperatively she did reasonably well considering her age and somewhat deconditioned state and mild memory disorder.  Her daughter stayed with her in the room throughout the hospital course and everything went well.  She did develop moderate drainage from her wounds and her hemoglobin level fell from around 12 to about 8.6 and then stabilized.  She progressed slowly but uneventfully and her diet and activities.  She was begging to go home on the day of discharge.       On the day of discharge she was alert.  Had been ambulating in the hall for 2 days with physical therapy.  She was  tolerating diet.  She had had bowel movements.  Her abdomen was soft and nondistended minimally tender.  Midline wound was clean and dry without infection.  Bilateral drainage was very thin serosanguineous but she had drained 105 cc the past 24 hours so we left the drains in       Diet and activities and wound care were discussed       Home health PT and nursing were arranged       I called in a prescription for tramadol, 20 tablets and iron supplementation       Otherwise she is to continue all of her usual medications       I asked her to see me in 6 to 7 days to consider staple removal and drain removal       I asked her to see her PCP within 1 week to make sure her medical problems were stabilized  Consults: Physical therapy.  Case management.  Significant Diagnostic Studies: Laboratory testing  Treatments: surgery: Open repair ventral incisional hernia, bilateral myofascial advancement flaps.  Implantation of mesh  Disposition: Home  Patient Instructions:  Allergies as of 04/26/2018      Reactions   Metronidazole Other (See Comments)   No taste in mouth and lost 32 lbs   Oxycodone Other (See Comments)    delusions   Ciprofloxacin Rash   Latex Itching, Rash      Medication List    TAKE these medications   acetaminophen 650 MG CR tablet Commonly known as:  TYLENOL Take 650-1,300 mg by mouth every 8 (eight) hours as needed for pain.   CALCIUM + D PO Take 2 tablets by mouth daily.   CENTRUM SILVER ADULT 50+ PO Take 1 capsule by mouth daily.   ferrous sulfate 300 (60 Fe) MG/5ML syrup Take 5 mLs (300 mg total) by mouth daily with breakfast.   levothyroxine 88 MCG tablet Commonly known as:  SYNTHROID, LEVOTHROID Take 88 mcg by mouth daily before  breakfast.   pantoprazole 40 MG tablet Commonly known as:  PROTONIX Take 40 mg by mouth daily.   pravastatin 20 MG tablet Commonly known as:  PRAVACHOL Take 20 mg by mouth every morning.   traMADol 50 MG tablet Commonly  known as:  ULTRAM Take 1 tablet (50 mg total) by mouth every 6 (six) hours as needed (mild pain).   XALATAN 0.005 % ophthalmic solution Generic drug:  latanoprost Place 1 drop into both eyes at bedtime.            Discharge Care Instructions  (From admission, onward)         Start     Ordered   04/26/18 0000  Discharge wound care:    Comments:  As discussed. Keep a written record of the drainage and bring that to the office with you Okay to shower Keep a dry bandage on the drain sites Midline wound can be open to air unless it is draining   04/26/18 0653          Activity: No heavy lifting or strenuous activities for 6 weeks or so Diet: low fat, low cholesterol diet Wound Care: as directed  Follow-up:  With Dr. Dalbert Batman in 6-7 days.    Addendum: I logged into the [employer] website and reviewed her prescription medication history   Signed: Edsel Petrin. Dalbert Batman, M.D., FACS General and minimally invasive surgery Breast and Colorectal Surgery  04/26/2018, 6:54 AM

## 2018-04-27 DIAGNOSIS — Z853 Personal history of malignant neoplasm of breast: Secondary | ICD-10-CM | POA: Diagnosis not present

## 2018-04-27 DIAGNOSIS — K219 Gastro-esophageal reflux disease without esophagitis: Secondary | ICD-10-CM | POA: Diagnosis not present

## 2018-04-27 DIAGNOSIS — Z48815 Encounter for surgical aftercare following surgery on the digestive system: Secondary | ICD-10-CM | POA: Diagnosis not present

## 2018-04-27 DIAGNOSIS — Z9181 History of falling: Secondary | ICD-10-CM | POA: Diagnosis not present

## 2018-04-27 DIAGNOSIS — N189 Chronic kidney disease, unspecified: Secondary | ICD-10-CM | POA: Diagnosis not present

## 2018-04-27 DIAGNOSIS — Z8719 Personal history of other diseases of the digestive system: Secondary | ICD-10-CM | POA: Diagnosis not present

## 2018-04-27 DIAGNOSIS — G3184 Mild cognitive impairment, so stated: Secondary | ICD-10-CM | POA: Diagnosis not present

## 2018-04-27 DIAGNOSIS — I129 Hypertensive chronic kidney disease with stage 1 through stage 4 chronic kidney disease, or unspecified chronic kidney disease: Secondary | ICD-10-CM | POA: Diagnosis not present

## 2018-04-29 DIAGNOSIS — K219 Gastro-esophageal reflux disease without esophagitis: Secondary | ICD-10-CM | POA: Diagnosis not present

## 2018-04-29 DIAGNOSIS — I129 Hypertensive chronic kidney disease with stage 1 through stage 4 chronic kidney disease, or unspecified chronic kidney disease: Secondary | ICD-10-CM | POA: Diagnosis not present

## 2018-04-29 DIAGNOSIS — Z48815 Encounter for surgical aftercare following surgery on the digestive system: Secondary | ICD-10-CM | POA: Diagnosis not present

## 2018-04-29 DIAGNOSIS — Z9181 History of falling: Secondary | ICD-10-CM | POA: Diagnosis not present

## 2018-04-29 DIAGNOSIS — G3184 Mild cognitive impairment, so stated: Secondary | ICD-10-CM | POA: Diagnosis not present

## 2018-04-29 DIAGNOSIS — N189 Chronic kidney disease, unspecified: Secondary | ICD-10-CM | POA: Diagnosis not present

## 2018-05-10 DIAGNOSIS — M1712 Unilateral primary osteoarthritis, left knee: Secondary | ICD-10-CM | POA: Diagnosis not present

## 2018-05-10 DIAGNOSIS — M722 Plantar fascial fibromatosis: Secondary | ICD-10-CM | POA: Diagnosis not present

## 2018-05-26 DIAGNOSIS — K432 Incisional hernia without obstruction or gangrene: Secondary | ICD-10-CM | POA: Diagnosis not present

## 2018-05-31 DIAGNOSIS — K432 Incisional hernia without obstruction or gangrene: Secondary | ICD-10-CM | POA: Diagnosis not present

## 2018-06-01 ENCOUNTER — Other Ambulatory Visit: Payer: Self-pay

## 2018-06-01 ENCOUNTER — Encounter (HOSPITAL_COMMUNITY): Payer: Self-pay | Admitting: Emergency Medicine

## 2018-06-01 ENCOUNTER — Emergency Department (HOSPITAL_COMMUNITY): Payer: Medicare Other

## 2018-06-01 ENCOUNTER — Emergency Department (HOSPITAL_COMMUNITY)
Admission: EM | Admit: 2018-06-01 | Discharge: 2018-06-01 | Disposition: A | Discharge status: Home / Self Care | Payer: Medicare Other | Attending: Emergency Medicine | Admitting: Emergency Medicine

## 2018-06-01 DIAGNOSIS — Z91040 Latex allergy status: Secondary | ICD-10-CM | POA: Diagnosis not present

## 2018-06-01 DIAGNOSIS — Z79899 Other long term (current) drug therapy: Secondary | ICD-10-CM | POA: Diagnosis not present

## 2018-06-01 DIAGNOSIS — R1310 Dysphagia, unspecified: Secondary | ICD-10-CM

## 2018-06-01 DIAGNOSIS — M5382 Other specified dorsopathies, cervical region: Secondary | ICD-10-CM | POA: Diagnosis not present

## 2018-06-01 DIAGNOSIS — E039 Hypothyroidism, unspecified: Secondary | ICD-10-CM | POA: Diagnosis not present

## 2018-06-01 DIAGNOSIS — F419 Anxiety disorder, unspecified: Secondary | ICD-10-CM | POA: Insufficient documentation

## 2018-06-01 DIAGNOSIS — Z853 Personal history of malignant neoplasm of breast: Secondary | ICD-10-CM | POA: Insufficient documentation

## 2018-06-01 DIAGNOSIS — I10 Essential (primary) hypertension: Secondary | ICD-10-CM | POA: Diagnosis not present

## 2018-06-01 MED ORDER — GI COCKTAIL ~~LOC~~
30.0000 mL | Freq: Once | ORAL | Status: AC
  Administered 2018-06-01: 30 mL via ORAL
  Filled 2018-06-01: qty 30

## 2018-06-01 MED ORDER — SUCRALFATE 1 G PO TABS: 1.0000 g | ORAL_TABLET | Freq: Three times a day (TID) | ORAL | 0 refills | Status: DC

## 2018-06-01 NOTE — Discharge Instructions (Signed)
Take medication as prescribed.  Continue taking Protonix.  Follow-up with gastroenterology.  If your symptoms persist she may need an endoscopy.

## 2018-06-01 NOTE — ED Provider Notes (Signed)
[facility] EMERGENCY DEPARTMENT Provider Note   CSN: [id number] Arrival date & time: 06/01/18  1047     History   Chief Complaint Chief Complaint  Patient presents with  . Dysphagia    HPI [name] is a 83 y.o. female.  HPI Presents with discomfort with swallowing for several weeks.  She is unable to specify the exact time period.  She states she sometimes has coughing episodes and the sensation that food gets stuck in her throat.  No nausea or vomiting.  States she was seen by a physician yesterday but does not remember his name.  Thinks he may have been a gastroenterologist. Past Medical History:  Diagnosis Date  . Breast cancer ([place]) 2003  . Diverticulitis    History  . Dyslexia   . GERD (gastroesophageal reflux disease)    takes Nexium daily  . H/O hiatal hernia   . Headache(784.0)    all the time  . Heart murmur   . History of esophageal spasm   . History of IBS    no problem in past yr  . History of kidney stones   . History of shingles   . HTN (hypertension)    hx of  . Hyperlipidemia    takes Pravastatin daily  . Hypokalemia   . Hypothyroidism    takes Synthroid daily  . Impaired hearing   . Incisional hernia    Abdomen  . Incisional hernia, without obstruction or gangrene 04/22/2018  . Joint pain   . Joint swelling   . Neck pain    pinched   . Osteoarthritis   . Osteoporosis 09/08/2011  . PONV (postoperative nausea and vomiting)   . Syncope, non cardiac   . Weakness    numbness left arm    Patient Active Problem List   Diagnosis Date Noted  . Incisional hernia, without obstruction or gangrene 04/22/2018  . Incisional hernia 04/22/2018  . Rectal bleeding 07/04/2017  . H/O colectomy 03/09/2017  . Abnormal CT scan, colon 02/09/2017  . Abnormal weight loss 12/27/2016  . Left sided colitis ([place]) 12/27/2016  . IDA (iron deficiency anemia) 10/18/2016  . Heme positive stool 10/18/2016  . S/P parathyroidectomy ([place]) 12/03/2013  . Neck mass  10/30/2013  . Diverticulitis 10/26/2013  . Generalized weakness 10/26/2013  . Diverticulitis with obstruction 10/26/2013  . Infiltrating lobular carcinoma, left breast 04/04/2013  . Syncope and collapse 02/21/2012  . Chest pain 02/21/2012  . Dehydration 02/21/2012  . Acute renal failure ([place]) 02/21/2012  . HTN (hypertension) 02/21/2012  . Hypothyroid 02/21/2012  . GERD (gastroesophageal reflux disease) 02/21/2012  . Osteoporosis 09/08/2011  . FULL INCONTINENCE OF FECES 11/07/2010  . DIARRHEA 11/07/2010    Past Surgical History:  Procedure Laterality Date  . APPENDECTOMY    . Arm Fx     left  . BREAST LUMPECTOMY     Left  . CARDIAC CATHETERIZATION     x 3-last one in the 90's per pt  . CATARACT EXTRACTION    . CATARACT EXTRACTION W/PHACO  10/02/2011   Procedure: CATARACT EXTRACTION PHACO AND INTRAOCULAR LENS PLACEMENT (IOC);  Surgeon: [name], MD;  Location: [facility];  Service: Ophthalmology;  Laterality: Right;  CDE:17.01  . CHOLECYSTECTOMY  1997   Status post -stones  . COLONOSCOPY  2012   Dr. Gala Romney: pancolonic diverticulosis, tubular adenoma removed from cecum. next TCS 2019  . COLONOSCOPY N/A 11/17/2016   Procedure: COLONOSCOPY;  Surgeon: [name], MD;  [facility];  Service: Endoscopy;  Laterality: N/A;  1200 - moved to 3/16 @ 12:00  . EGD with dilitation     2004/2005 schatki's ring s/p dilation, wrap no longer intact on 2005 egd.  . ESOPHAGOGASTRODUODENOSCOPY N/A 11/17/2016   Procedure: ESOPHAGOGASTRODUODENOSCOPY (EGD);  Surgeon: [name], MD;  [facility];  Service: Endoscopy;  Laterality: N/A;  . INCISIONAL HERNIA REPAIR N/A 04/22/2018   Procedure: [place];  Surgeon: [name], MD;  Location: [place];  Service: General;  Laterality: N/A;  . INSERTION OF MESH N/A 04/22/2018   Procedure: INSERTION OF MESH;  Surgeon: [name], MD;  Location: [place];  Service: General;  Laterality: N/A;  .  kidney stent  06/04/11   removed after about 3 weeks.  Marland Kitchen [place]  . KNEE SURGERY     bilateral  . LAPAROSCOPIC NISSEN FUNDOPLICATION  1610  . LAPAROSCOPIC PARTIAL COLECTOMY N/A 03/09/2017   Procedure: LAPAROSCOPY, TAKEDOWN SPLENIC FLEXURE, LEFT COLECTOMY, PROCTOSCOPY;  Surgeon: [name], MD;  [facility];  Service: General;  Laterality: N/A;  . LITHOTRIPSY  11/12  . MANDIBLE RECONSTRUCTION    . NECK SURGERY  2000   [facility]  . PARATHYROIDECTOMY  12/03/2013   DR TEOH  . PROCTOSCOPY  03/09/2017   Procedure: PROCTOSCOPY;  Surgeon: [name], MD;  [facility];  Service: General;;  . SHOULDER SURGERY Left   . THYROIDECTOMY     Hypothyroidism status post  . THYROIDECTOMY N/A 12/03/2013   Procedure: PARATHYROIDECTOMY;  Surgeon: [name], MD;  Location: [place];  Service: ENT;  Laterality: N/A;  . TONSILLECTOMY    . TUBAL LIGATION    . WRIST SURGERY Left 2009     OB History   None      Home Medications    Prior to Admission medications   Medication Sig Start Date End Date Taking? Authorizing Provider  Calcium Citrate-Vitamin D (CALCIUM + D PO) Take 2 tablets by mouth daily.    Yes [provider]  ferrous sulfate 300 (60 Fe) MG/5ML syrup Take 5 mLs (300 mg total) by mouth daily with breakfast. 04/26/18  Yes [name], MD  latanoprost (XALATAN) 0.005 % ophthalmic solution Place 1 drop into both eyes at bedtime. 12/17/17  Yes [provider]  levothyroxine (SYNTHROID, LEVOTHROID) 88 MCG tablet Take 88 mcg by mouth daily before breakfast.   Yes [provider]  Multiple Vitamins-Minerals (CENTRUM SILVER ADULT 50+ PO) Take 1 capsule by mouth daily.   Yes [provider]  pantoprazole (PROTONIX) 40 MG tablet Take 40 mg by mouth daily.   Yes [provider]  pravastatin (PRAVACHOL) 20 MG tablet Take 20 mg by mouth every morning.    Yes [provider]  traMADol (ULTRAM) 50 MG tablet Take 1 tablet (50  mg total) by mouth every 6 (six) hours as needed (mild pain). 04/26/18  Yes [name], MD  acetaminophen (TYLENOL) 650 MG CR tablet Take 650-1,300 mg by mouth every 8 (eight) hours as needed for pain.    [provider]  sucralfate (CARAFATE) 1 g tablet Take 1 tablet (1 g total) by mouth 4 (four) times daily -  with meals and at bedtime. 06/01/18   Julianne Rice, MD    Family History Family History  Problem Relation Age of Onset  . Macular degeneration Sister   . Lung cancer Sister   . Pancreatic cancer Mother   . Heart attack Father   .  Anesthesia problems Neg Hx   . Hypotension Neg Hx   . Pseudochol deficiency Neg Hx   . Malignant hyperthermia Neg Hx   . Colon cancer Neg Hx     Social History Social History   Tobacco Use  . Smoking status: Never Smoker  . Smokeless tobacco: Never Used  Substance Use Topics  . Alcohol use: No  . Drug use: No     Allergies   Metronidazole; Oxycodone; Ciprofloxacin; and Latex   Review of Systems Review of Systems  Constitutional: Negative for chills and fever.  HENT: Positive for sore throat and trouble swallowing. Negative for voice change.   Respiratory: Negative for shortness of breath.   Cardiovascular: Negative for chest pain.  Gastrointestinal: Negative for abdominal pain, diarrhea, nausea and vomiting.  Musculoskeletal: Positive for neck pain.  Skin: Negative for rash and wound.  Neurological: Negative for weakness, numbness and headaches.  Psychiatric/Behavioral: Positive for confusion. The patient is nervous/anxious.   All other systems reviewed and are negative.    Physical Exam Updated Vital Signs BP (!) 158/88 (BP Location: Right Arm)   Pulse 81   Temp 97.9 F (36.6 C) (Oral)   Resp 18   Ht 5' 8.5" (1.74 m)   Wt 71.7 kg   SpO2 97%   BMI 23.67 kg/m   Physical Exam  Constitutional: She is oriented to person, place, and time. She appears well-developed and well-nourished. No distress.  HENT:    Head: Normocephalic and atraumatic.  Mouth/Throat: Oropharynx is clear and moist. No oropharyngeal exudate.  Eyes: Pupils are equal, round, and reactive to light. EOM are normal.  Neck: Normal range of motion. Neck supple. No JVD present.  No stridor.Patient's tenderness over the thyroid cartilage.  No obvious abnormal masses or inguinal lymphadenopathy.  Cardiovascular: Normal rate and regular rhythm.  Pulmonary/Chest: Effort normal and breath sounds normal. No stridor. She has no wheezes. She has no rales.  Abdominal: Soft. Bowel sounds are normal. There is no tenderness. There is no rebound and no guarding.  Musculoskeletal: Normal range of motion. She exhibits no edema or tenderness.  Lymphadenopathy:    She has no cervical adenopathy.  Neurological: She is alert and oriented to person, place, and time.  Moving all extremities without focal deficit.  Sensation intact.  Skin: Skin is warm and dry. No rash noted. She is not diaphoretic. No erythema.  Psychiatric: She has a normal mood and affect. Her behavior is normal.  Nursing note and vitals reviewed.    ED Treatments / Results  Labs (all labs ordered are listed, but only abnormal results are displayed) Labs Reviewed - No data to display  EKG None  Radiology Dg Neck Soft Tissue  Result Date: 06/01/2018 CLINICAL DATA:  Sensation of something being stuck in the throat. Recent ingestion of chicken bone. EXAM: NECK SOFT TISSUES - 1+ VIEW COMPARISON:  None. FINDINGS: There is no evidence of retropharyngeal soft tissue swelling or epiglottic enlargement. The cervical airway is unremarkable and no radio-opaque foreign body identified. IMPRESSION: No radiopaque foreign body along the course of the cervical aerodigestive tract. Electronically Signed   By: Ulyses Jarred M.D.   On: 06/01/2018 13:03    Procedures Procedures (including critical care time)  Medications Ordered in ED Medications  gi cocktail (Maalox,Lidocaine,Donnatal)  (30 mLs Oral Given 06/01/18 1406)     Initial Impression / Assessment and Plan / ED Course  I have reviewed the triage vital signs and the nursing notes.  Pertinent labs & imaging  results that were available during my care of the patient were reviewed by me and considered in my medical decision making (see chart for details).     X-ray without acute findings.  Patient does have relief after GI cocktail.  No vomiting.  Tolerating secretions.  Suspect her symptoms may be related to esophagitis due to gastroesophageal reflux disease.  Low suspicion of a obstructive lesion or foreign object.  Will start on Carafate.  Advised to follow-up with gastroenterology.  She may need an upper endoscopy if her symptoms persist.  There is an element of her symptoms related to anxiety.  She understands need to follow-up with her primary physician regarding this.  Return precautions given.  Final Clinical Impressions(s) / ED Diagnoses   Final diagnoses:  Dysphagia, unspecified type  Anxiety    ED Discharge Orders         Ordered    sucralfate (CARAFATE) 1 g tablet  3 times daily with meals & bedtime     06/01/18 1443           Julianne Rice, MD 06/01/18 1445

## 2018-06-01 NOTE — ED Triage Notes (Signed)
Patient c/o difficulty swallowing x2 weeks, patient states feels like something is caught in throat. Patient states seen by GI specialist yesterday in [place] and told that she didn't have anything wrong with her throat. Patient states she would like a second opinon. Denies having endoscopy yesterday.

## 2018-07-08 ENCOUNTER — Emergency Department (HOSPITAL_COMMUNITY)
Admission: EM | Admit: 2018-07-08 | Discharge: 2018-07-08 | Disposition: A | Discharge status: Home / Self Care | Payer: Medicare Other | Attending: Emergency Medicine | Admitting: Emergency Medicine

## 2018-07-08 ENCOUNTER — Other Ambulatory Visit: Payer: Self-pay

## 2018-07-08 ENCOUNTER — Encounter (HOSPITAL_COMMUNITY): Payer: Self-pay | Admitting: Emergency Medicine

## 2018-07-08 ENCOUNTER — Emergency Department (HOSPITAL_COMMUNITY): Payer: Medicare Other

## 2018-07-08 DIAGNOSIS — I10 Essential (primary) hypertension: Secondary | ICD-10-CM | POA: Insufficient documentation

## 2018-07-08 DIAGNOSIS — Z91040 Latex allergy status: Secondary | ICD-10-CM | POA: Diagnosis not present

## 2018-07-08 DIAGNOSIS — R519 Headache, unspecified: Secondary | ICD-10-CM

## 2018-07-08 DIAGNOSIS — Z853 Personal history of malignant neoplasm of breast: Secondary | ICD-10-CM | POA: Diagnosis not present

## 2018-07-08 DIAGNOSIS — R51 Headache: Secondary | ICD-10-CM | POA: Diagnosis not present

## 2018-07-08 LAB — CBC WITH DIFFERENTIAL/PLATELET
ABS IMMATURE GRANULOCYTES: 0.04 10*3/uL (ref 0.00–0.07)
BASOS ABS: 0.1 10*3/uL (ref 0.0–0.1)
BASOS PCT: 1 %
Eosinophils Absolute: 0.2 10*3/uL (ref 0.0–0.5)
Eosinophils Relative: 3 %
HCT: 39.5 % (ref 36.0–46.0)
Hemoglobin: 12.2 g/dL (ref 12.0–15.0)
IMMATURE GRANULOCYTES: 1 %
Lymphocytes Relative: 33 %
Lymphs Abs: 2.4 10*3/uL (ref 0.7–4.0)
MCH: 28 pg (ref 26.0–34.0)
MCHC: 30.9 g/dL (ref 30.0–36.0)
MCV: 90.6 fL (ref 80.0–100.0)
MONOS PCT: 9 %
Monocytes Absolute: 0.6 10*3/uL (ref 0.1–1.0)
NEUTROS ABS: 4 10*3/uL (ref 1.7–7.7)
NEUTROS PCT: 53 %
PLATELETS: 287 10*3/uL (ref 150–400)
RBC: 4.36 MIL/uL (ref 3.87–5.11)
RDW: 13.4 % (ref 11.5–15.5)
WBC: 7.4 10*3/uL (ref 4.0–10.5)
nRBC: 0 % (ref 0.0–0.2)

## 2018-07-08 LAB — COMPREHENSIVE METABOLIC PANEL
ALT: 9 U/L (ref 0–44)
ANION GAP: 9 (ref 5–15)
AST: 16 U/L (ref 15–41)
Albumin: 3.7 g/dL (ref 3.5–5.0)
Alkaline Phosphatase: 47 U/L (ref 38–126)
BUN: 17 mg/dL (ref 8–23)
CHLORIDE: 104 mmol/L (ref 98–111)
CO2: 27 mmol/L (ref 22–32)
Calcium: 8.6 mg/dL — ABNORMAL LOW (ref 8.9–10.3)
Creatinine, Ser: 0.71 mg/dL (ref 0.44–1.00)
GFR calc Af Amer: >60 mL/min (ref >60)
Glucose, Bld: 92 mg/dL (ref 70–99)
POTASSIUM: 3.3 mmol/L — AB (ref 3.5–5.1)
SODIUM: 140 mmol/L (ref 135–145)
Total Bilirubin: 0.4 mg/dL (ref 0.3–1.2)
Total Protein: 7.2 g/dL (ref 6.5–8.1)

## 2018-07-08 MED ORDER — METHYLPREDNISOLONE SODIUM SUCC 125 MG IJ SOLR
125.0000 mg | Freq: Once | INTRAMUSCULAR | Status: AC
  Administered 2018-07-08: 125 mg via INTRAVENOUS
  Filled 2018-07-08: qty 2

## 2018-07-08 MED ORDER — DIPHENHYDRAMINE HCL 50 MG/ML IJ SOLN
25.0000 mg | Freq: Once | INTRAMUSCULAR | Status: AC
  Administered 2018-07-08: 25 mg via INTRAVENOUS
  Filled 2018-07-08: qty 1

## 2018-07-08 MED ORDER — LABETALOL HCL 5 MG/ML IV SOLN
5.0000 mg | Freq: Once | INTRAVENOUS | Status: DC
  Filled 2018-07-08: qty 4

## 2018-07-08 MED ORDER — METOCLOPRAMIDE HCL 5 MG/ML IJ SOLN
10.0000 mg | Freq: Once | INTRAMUSCULAR | Status: AC
  Administered 2018-07-08: 10 mg via INTRAVENOUS
  Filled 2018-07-08: qty 2

## 2018-07-08 NOTE — ED Provider Notes (Signed)
Emergency Department Provider Note   I have reviewed the triage vital signs and the nursing notes.   HISTORY  Chief Complaint Headache   HPI [name] is a 83 y.o. female with multiple medical problems who presents the emergency department today secondary to headache and hypertension.  Patient states that headache started this morning and progressively worsened throughout the day and they checked her blood pressure was high as 200/103 at home so they called her primary doctor who probably told her to come to emergency room immediately for evaluation.  They checked her blood pressure approximately 6 times in our and a half time.  And stated it was very high all 6 times.  She has history of hypertension in the past but has not been on medications recently since she lost weight.  Will intermittently be a little bit elevated but usually comes down without intervention so is not on any antihypertensives.  States her headache is kind of all over but also has blurry vision her left eye.  She has history of glaucoma but is unsure if this is related to that or not.  She thinks it might be low but worse today since her blood pressures are high.  No chest pain or abdominal pain.  No urinary changes, lower extremity swelling, shortness of breath, cough. No other associated or modifying symptoms.    Past Medical History:  Diagnosis Date  . Breast cancer ([place]) 2003  . Diverticulitis    History  . Dyslexia   . GERD (gastroesophageal reflux disease)    takes Nexium daily  . H/O hiatal hernia   . Headache(784.0)    all the time  . Heart murmur   . History of esophageal spasm   . History of IBS    no problem in past yr  . History of kidney stones   . History of shingles   . HTN (hypertension)    hx of  . Hyperlipidemia    takes Pravastatin daily  . Hypokalemia   . Hypothyroidism    takes Synthroid daily  . Impaired hearing   . Incisional hernia    Abdomen  . Incisional hernia,  without obstruction or gangrene 04/22/2018  . Joint pain   . Joint swelling   . Neck pain    pinched   . Osteoarthritis   . Osteoporosis 09/08/2011  . PONV (postoperative nausea and vomiting)   . Syncope, non cardiac   . Weakness    numbness left arm    Patient Active Problem List   Diagnosis Date Noted  . Incisional hernia, without obstruction or gangrene 04/22/2018  . Incisional hernia 04/22/2018  . Rectal bleeding 07/04/2017  . H/O colectomy 03/09/2017  . Abnormal CT scan, colon 02/09/2017  . Abnormal weight loss 12/27/2016  . Left sided colitis ([place]) 12/27/2016  . IDA (iron deficiency anemia) 10/18/2016  . Heme positive stool 10/18/2016  . S/P parathyroidectomy 12/03/2013  . Neck mass 10/30/2013  . Diverticulitis 10/26/2013  . Generalized weakness 10/26/2013  . Diverticulitis with obstruction 10/26/2013  . Infiltrating lobular carcinoma, left breast 04/04/2013  . Syncope and collapse 02/21/2012  . Chest pain 02/21/2012  . Dehydration 02/21/2012  . Acute renal failure ([place]) 02/21/2012  . HTN (hypertension) 02/21/2012  . Hypothyroid 02/21/2012  . GERD (gastroesophageal reflux disease) 02/21/2012  . Osteoporosis 09/08/2011  . FULL INCONTINENCE OF FECES 11/07/2010  . DIARRHEA 11/07/2010    Past Surgical History:  Procedure Laterality Date  . APPENDECTOMY    .  Arm Fx     left  . BREAST LUMPECTOMY     Left  . CARDIAC CATHETERIZATION     x 3-last one in the 90's per pt  . CATARACT EXTRACTION    . CATARACT EXTRACTION W/PHACO  10/02/2011   Procedure: CATARACT EXTRACTION PHACO AND INTRAOCULAR LENS PLACEMENT (IOC);  Surgeon: [name], MD;  [facility]: [facility];  Service: Ophthalmology;  Laterality: Right;  CDE:17.01  . CHOLECYSTECTOMY  1997   Status post -stones  . COLONOSCOPY  2012   Dr. Gala Romney: pancolonic diverticulosis, tubular adenoma removed from cecum. next TCS 2019  . COLONOSCOPY N/A 11/17/2016   Procedure: COLONOSCOPY;  Surgeon: [name], MD;  [facility];  Service: Endoscopy;  Laterality: N/A;  1200 - moved to 3/16 @ 12:00  . EGD with dilitation     2004/2005 schatki's ring s/p dilation, wrap no longer intact on 2005 egd.  . ESOPHAGOGASTRODUODENOSCOPY N/A 11/17/2016   Procedure: ESOPHAGOGASTRODUODENOSCOPY (EGD);  Surgeon: [name], MD;  [facility];  Service: Endoscopy;  Laterality: N/A;  . INCISIONAL HERNIA REPAIR N/A 04/22/2018   Procedure: [place];  Surgeon: [name], MD;  Location: [place];  Service: General;  Laterality: N/A;  . INSERTION OF MESH N/A 04/22/2018   Procedure: INSERTION OF MESH;  Surgeon: [name], MD;  Location: [place];  Service: General;  Laterality: N/A;  . kidney stent  06/04/11   removed after about 3 weeks.  Marland Kitchen [place]  . KNEE SURGERY     bilateral  . LAPAROSCOPIC NISSEN FUNDOPLICATION  1610  . LAPAROSCOPIC PARTIAL COLECTOMY N/A 03/09/2017   Procedure: LAPAROSCOPY, TAKEDOWN SPLENIC FLEXURE, LEFT COLECTOMY, PROCTOSCOPY;  Surgeon: [name], MD;  [facility];  Service: General;  Laterality: N/A;  . LITHOTRIPSY  11/12  . MANDIBLE RECONSTRUCTION    . NECK SURGERY  2000   [facility]  . PARATHYROIDECTOMY  12/03/2013   DR TEOH  . PROCTOSCOPY  03/09/2017   Procedure: PROCTOSCOPY;  Surgeon: [name], MD;  [facility];  Service: General;;  . SHOULDER SURGERY Left   . THYROIDECTOMY     Hypothyroidism status post  . THYROIDECTOMY N/A 12/03/2013   Procedure: PARATHYROIDECTOMY;  Surgeon: [name], MD;  Location: [place];  Service: ENT;  Laterality: N/A;  . TONSILLECTOMY    . TUBAL LIGATION    . WRIST SURGERY Left 2009    Current Outpatient Rx  . Order #: [id number] Class: Historical Med  . Order #: [id number] Class: Historical Med  . Order #: [id number] Class: Normal  . Order #: [id number] Class: Historical Med  . Order #: [id number] Class: Historical Med  . Order #: [id number] Class: Historical Med  . Order #: [id number] Class:  Historical Med  . Order #: [id number] Class: Historical Med    Allergies Metronidazole; Oxycodone; Ciprofloxacin; and Latex  Family History  Problem Relation Age of Onset  . Macular degeneration Sister   . Lung cancer Sister   . Pancreatic cancer Mother   . Heart attack Father   . Anesthesia problems Neg Hx   . Hypotension Neg Hx   . Pseudochol deficiency Neg Hx   . Malignant hyperthermia Neg Hx   . Colon cancer Neg Hx     Social History Social History   Tobacco Use  . Smoking status: Never Smoker  . Smokeless tobacco: Never Used  Substance Use Topics  . Alcohol use: No  . Drug use: No    Review of  Systems  All other systems negative except as documented in the HPI. All pertinent positives and negatives as reviewed in the HPI. ____________________________________________   PHYSICAL EXAM:  VITAL SIGNS: ED Triage Vitals  Enc Vitals Group     BP 07/08/18 1618 (!) 193/108     Pulse Rate 07/08/18 1616 91     Resp --      Temp 07/08/18 1616 98 F (36.7 C)     Temp Source 07/08/18 1616 Oral     SpO2 07/08/18 1616 98 %     Weight 07/08/18 1614 156 lb (70.8 kg)     Height 07/08/18 1614 5\' 6"  (1.676 m)    Constitutional: Alert and oriented. Well appearing and in no acute distress. Eyes: Conjunctivae are normal. PERRL. EOMI. Head: Atraumatic. Nose: No congestion/rhinnorhea. Mouth/Throat: Mucous membranes are moist.  Oropharynx non-erythematous. Neck: No stridor.  No meningeal signs.   Cardiovascular: Normal rate, regular rhythm. Good peripheral circulation. Grossly normal heart sounds.   Respiratory: Normal respiratory effort.  No retractions. Lungs CTAB. Gastrointestinal: Soft and nontender. No distention.  Musculoskeletal: No lower extremity tenderness nor edema. No gross deformities of extremities. Neurologic:  Normal speech and language. No gross focal neurologic deficits are appreciated.  Skin:  Skin is warm, dry and intact. No rash  noted.   ____________________________________________   LABS (all labs ordered are listed, but only abnormal results are displayed)  Labs Reviewed  COMPREHENSIVE METABOLIC PANEL - Abnormal; Notable for the following components:      Result Value   Potassium 3.3 (*)    Calcium 8.6 (*)    All other components within normal limits  CBC WITH DIFFERENTIAL/PLATELET   ____________________________________________  EKG   EKG Interpretation  Date/Time:    Ventricular Rate:    PR Interval:    QRS Duration:   QT Interval:    QTC Calculation:   R Axis:     Text Interpretation:         ____________________________________________  RADIOLOGY  Ct Head Wo Contrast  Result Date: 07/08/2018 CLINICAL DATA:  83 y/o  F; headache and hypertension. EXAM: CT HEAD WITHOUT CONTRAST TECHNIQUE: Contiguous axial images were obtained from the base of the skull through the vertex without intravenous contrast. COMPARISON:  08/23/2016 CT head. FINDINGS: Brain: No evidence of acute infarction, hemorrhage, hydrocephalus, extra-axial collection or mass lesion/mass effect. Stable chronic microvascular ischemic changes and volume loss of the brain. Vascular: Calcific atherosclerosis of carotid siphons and vertebral arteries. Skull: Normal. Negative for fracture or focal lesion. Sinuses/Orbits: Partial opacification of right mastoid tip. Visible paranasal sinuses and mastoid air cells are otherwise normally aerated. Bilateral intra-ocular lens replacement. Other: None. IMPRESSION: 1. No acute intracranial abnormality. 2. Stable chronic microvascular ischemic changes and volume loss of the brain. Electronically Signed   By: Kristine Garbe M.D.   On: 07/08/2018 21:03    ____________________________________________   PROCEDURES  Procedure(s) performed:   Procedures   ____________________________________________   INITIAL IMPRESSION / ASSESSMENT AND PLAN / ED COURSE  Unclear if her headache  started and caused high blood pressure secondary to pain or vice versa.  She is a little bit hypertensive here so we will give labetalol on with a headache cocktail to ensure symptoms improve.  Appears patient's blood pressure improved without intervention to 156/91.  We will just treat her headache at this time to make sure there is no intracranial abnormalities as she does have a history of cancer and will reevaluate for disposition. Will continue to hold anti-hypertensives.  HA improved. Workup unremarkable. Stable for dc w/ pcp follow up.    Pertinent labs & imaging results that were available during my care of the patient were reviewed by me and considered in my medical decision making (see chart for details).  ____________________________________________  FINAL CLINICAL IMPRESSION(S) / ED DIAGNOSES  Final diagnoses:  Acute nonintractable headache, unspecified headache type  Hypertension, unspecified type     MEDICATIONS GIVEN DURING THIS VISIT:  Medications  labetalol (NORMODYNE,TRANDATE) injection 5 mg (0 mg Intravenous Hold 07/08/18 1826)  metoCLOPramide (REGLAN) injection 10 mg (10 mg Intravenous Given 07/08/18 1819)  diphenhydrAMINE (BENADRYL) injection 25 mg (25 mg Intravenous Given 07/08/18 1819)  methylPREDNISolone sodium succinate (SOLU-MEDROL) 125 mg/2 mL injection 125 mg (125 mg Intravenous Given 07/08/18 1819)     NEW OUTPATIENT MEDICATIONS STARTED DURING THIS VISIT:  Discharge Medication List as of 07/08/2018  9:35 PM      Note:  This note was prepared with assistance of Dragon voice recognition software. Occasional wrong-word or sound-a-like substitutions may have occurred due to the inherent limitations of voice recognition software.   Merrily Pew, MD 07/08/18 2227

## 2018-07-08 NOTE — ED Triage Notes (Signed)
Patient reports headache that "has been there all day." Family reports elevated BP with systolic pressures in the 200s.

## 2018-07-24 DIAGNOSIS — I10 Essential (primary) hypertension: Secondary | ICD-10-CM | POA: Diagnosis not present

## 2018-07-24 DIAGNOSIS — M810 Age-related osteoporosis without current pathological fracture: Secondary | ICD-10-CM | POA: Diagnosis not present

## 2018-07-24 DIAGNOSIS — K432 Incisional hernia without obstruction or gangrene: Secondary | ICD-10-CM | POA: Diagnosis not present

## 2018-07-24 DIAGNOSIS — E039 Hypothyroidism, unspecified: Secondary | ICD-10-CM | POA: Diagnosis not present

## 2018-07-24 DIAGNOSIS — G301 Alzheimer's disease with late onset: Secondary | ICD-10-CM | POA: Diagnosis not present

## 2018-07-24 DIAGNOSIS — M170 Bilateral primary osteoarthritis of knee: Secondary | ICD-10-CM | POA: Diagnosis not present

## 2018-07-24 DIAGNOSIS — E782 Mixed hyperlipidemia: Secondary | ICD-10-CM | POA: Diagnosis not present

## 2018-07-24 DIAGNOSIS — Z6825 Body mass index (BMI) 25.0-25.9, adult: Secondary | ICD-10-CM | POA: Diagnosis not present

## 2018-07-25 ENCOUNTER — Other Ambulatory Visit: Payer: Self-pay

## 2018-09-19 IMAGING — CT CT CERVICAL SPINE W/O CM
5 of 8 series · 12 of 33 positions shown, 13 images · non-contrast
Comparison: 02/21/2012

CLINICAL DATA: Headache, left side neck pain starting 08/20/2016 no
known injury

EXAM:
CT HEAD WITHOUT CONTRAST
CT CERVICAL SPINE WITHOUT CONTRAST
TECHNIQUE: Multidetector CT imaging of the head and cervical spine was
performed following the standard protocol without intravenous
contrast. Multiplanar CT image reconstructions of the cervical spine
were also generated.

[Series 5: head bone · axial · 0.43mm/px · z∈[+102,+154]mm · 2 of 78 slices shown]
[im 26/78  bone]
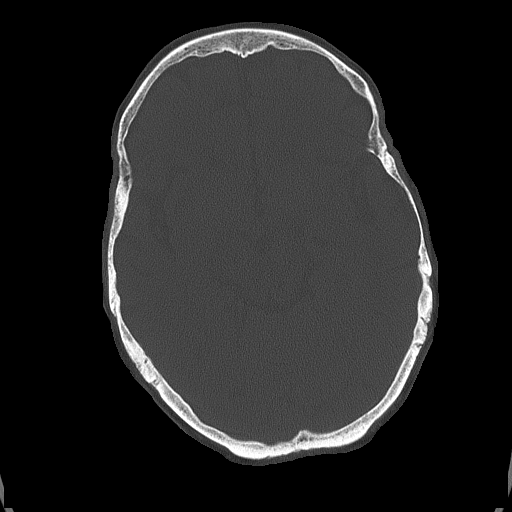
[im 52/78  bone]
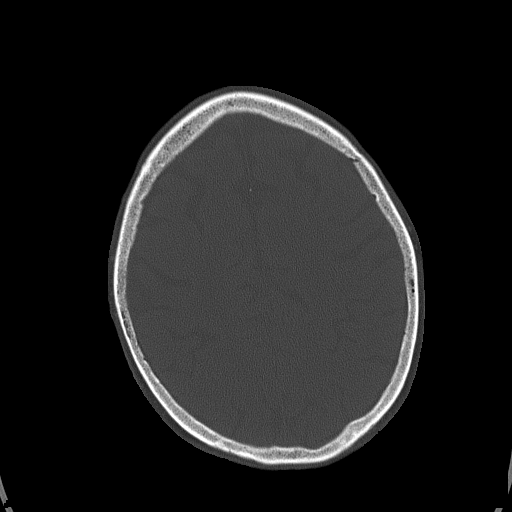

[Series 9: c spine soft · axial · 0.23mm/px · z∈[-50,+2]mm · 2 of 80 slices shown]
[im 27/80  soft-tissue]
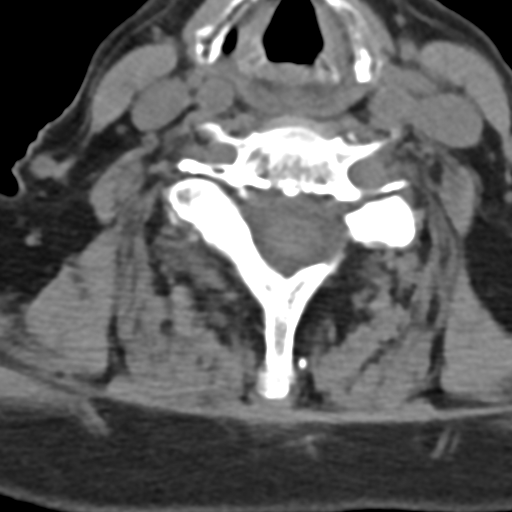
[im 53/80  soft-tissue]
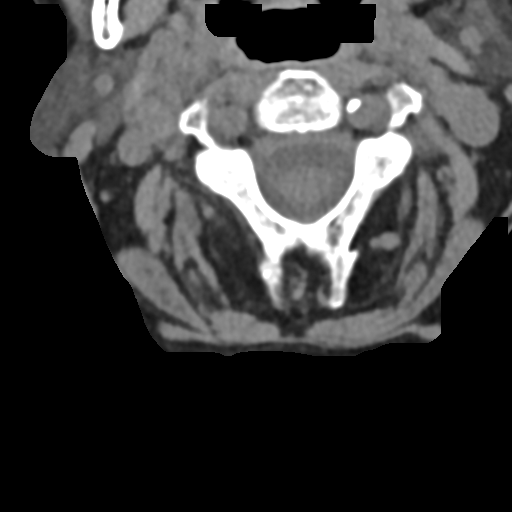

[Series 10: sagittal bone · sagittal · 0.33mm/px · 5 of 61 slices shown]
[im 11/61  bone]
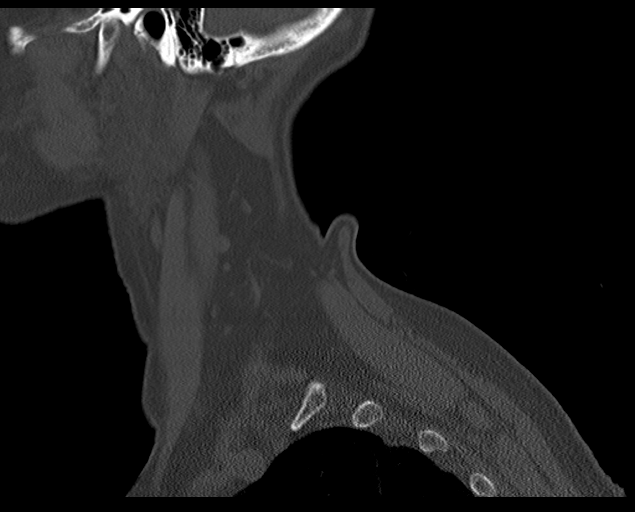
[im 21/61  bone]
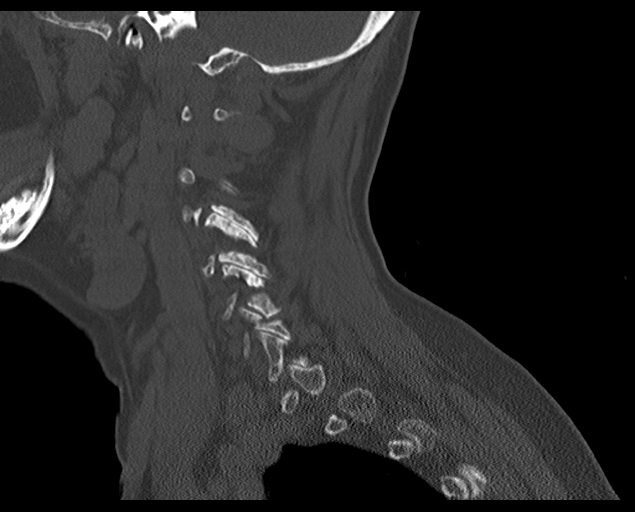
[im 31/61  bone]
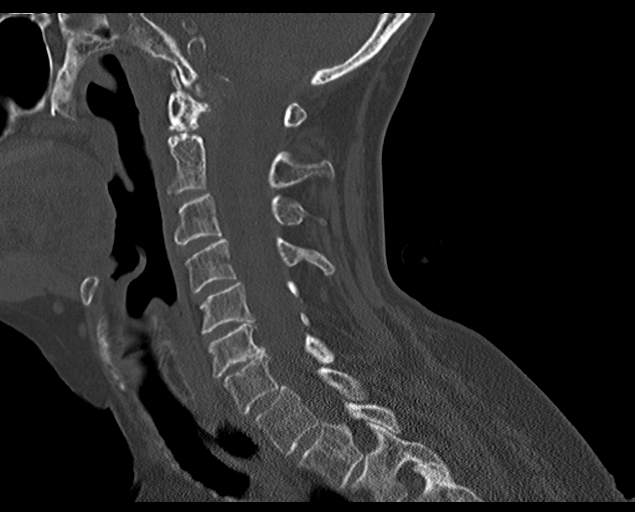
[im 41/61  bone]
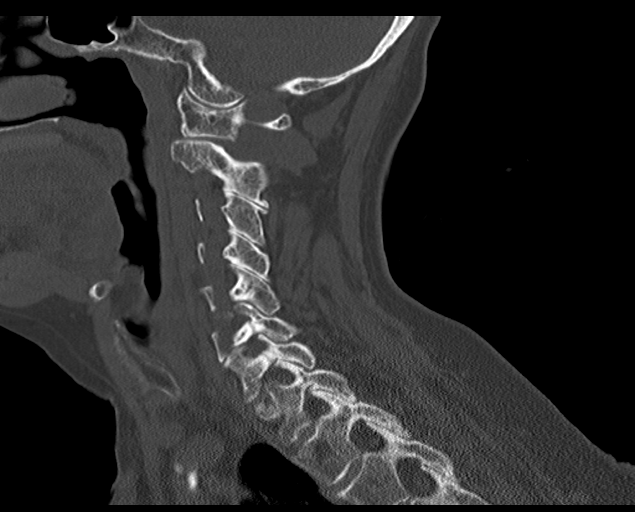
[im 51/61  bone]
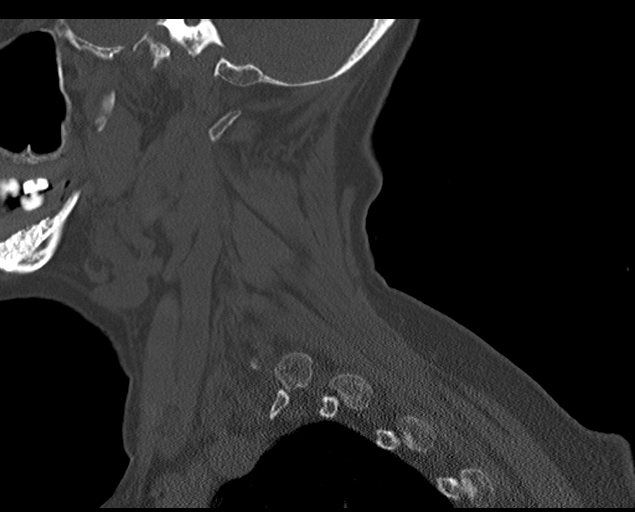

[Series 11: coronal bone · coronal · 0.23mm/px · 1 of 67 slices shown]
[im 34/67  bone]
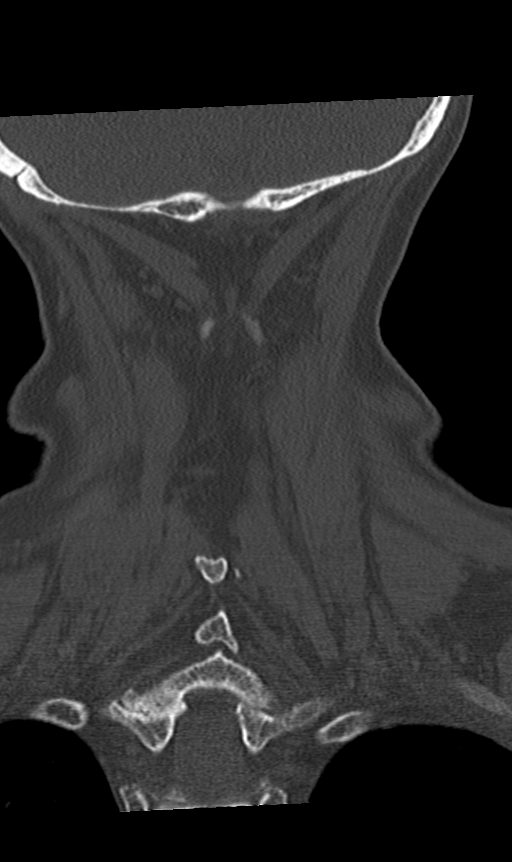

[Series 14: orthogonal bone · axial · 0.21mm/px · z∈[-68,-19]mm · 2 of 85 slices shown, 3 images]
[im 29/85  soft-tissue]
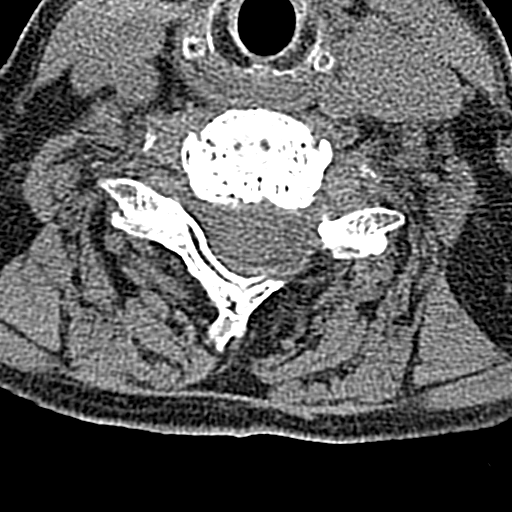
[im 29/85  bone]
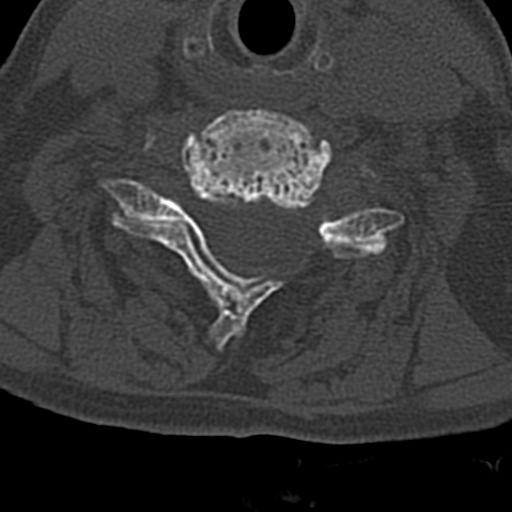
[im 57/85  bone]
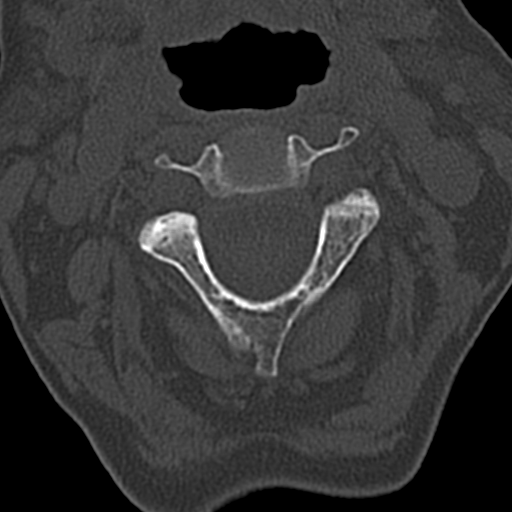

[12 of 33 positions shown; findings below may reference images not displayed]

FINDINGS: CT HEAD FINDINGS

Brain: No intracranial hemorrhage, mass effect or midline shift.
Mild cerebral atrophy. Mild periventricular chronic white matter
disease. No acute cortical infarction. No mass lesion is noted on
this unenhanced scan.

Vascular: Extensive atherosclerotic calcifications of vertebral
arteries. Atherosclerotic calcifications of carotid siphon.

Skull: No skull fracture.

Sinuses/Orbits: No acute findings.

Other: None

CT CERVICAL SPINE FINDINGS

Alignment: There is normal alignment.

Skull base and vertebrae: No acute fracture or subluxation.
Degenerative changes are noted C1-C2 articulation. Mild posterior
spurring at C5-C6 and C6-C7 level.

Soft tissues and spinal canal: No prevertebral soft tissue swelling.
Cervical airway is patent. Minimal spinal canal stenosis due to
posterior spurring at C5-C6 and C6-C7 level.

Disc levels:  Mild disc space flattening at C5-C6 and C6-C7 level.

Upper chest: There is no pneumothorax in visualized lung apices.

Other: None
IMPRESSION: 1. No acute intracranial abnormality.
2. Mild cerebral atrophy. Mild periventricular chronic white matter
disease. No acute cortical infarction.
3. Extensive atherosclerotic calcifications of vertebral arteries
and carotid siphon.
4. No cervical spine acute fracture or subluxation. Degenerative
changes as described above.

## 2018-11-08 DIAGNOSIS — Z961 Presence of intraocular lens: Secondary | ICD-10-CM | POA: Diagnosis not present

## 2018-11-08 DIAGNOSIS — H401131 Primary open-angle glaucoma, bilateral, mild stage: Secondary | ICD-10-CM | POA: Diagnosis not present

## 2018-12-02 DIAGNOSIS — E782 Mixed hyperlipidemia: Secondary | ICD-10-CM | POA: Diagnosis not present

## 2018-12-02 DIAGNOSIS — E039 Hypothyroidism, unspecified: Secondary | ICD-10-CM | POA: Diagnosis not present

## 2018-12-02 DIAGNOSIS — K210 Gastro-esophageal reflux disease with esophagitis: Secondary | ICD-10-CM | POA: Diagnosis not present

## 2018-12-02 DIAGNOSIS — G301 Alzheimer's disease with late onset: Secondary | ICD-10-CM | POA: Diagnosis not present

## 2018-12-02 DIAGNOSIS — N183 Chronic kidney disease, stage 3 (moderate): Secondary | ICD-10-CM | POA: Diagnosis not present

## 2018-12-02 DIAGNOSIS — I10 Essential (primary) hypertension: Secondary | ICD-10-CM | POA: Diagnosis not present

## 2018-12-05 DIAGNOSIS — K432 Incisional hernia without obstruction or gangrene: Secondary | ICD-10-CM | POA: Diagnosis not present

## 2018-12-05 DIAGNOSIS — G301 Alzheimer's disease with late onset: Secondary | ICD-10-CM | POA: Diagnosis not present

## 2018-12-05 DIAGNOSIS — M810 Age-related osteoporosis without current pathological fracture: Secondary | ICD-10-CM | POA: Diagnosis not present

## 2018-12-05 DIAGNOSIS — E782 Mixed hyperlipidemia: Secondary | ICD-10-CM | POA: Diagnosis not present

## 2018-12-05 DIAGNOSIS — K219 Gastro-esophageal reflux disease without esophagitis: Secondary | ICD-10-CM | POA: Diagnosis not present

## 2018-12-05 DIAGNOSIS — E039 Hypothyroidism, unspecified: Secondary | ICD-10-CM | POA: Diagnosis not present

## 2018-12-05 DIAGNOSIS — I10 Essential (primary) hypertension: Secondary | ICD-10-CM | POA: Diagnosis not present

## 2018-12-05 DIAGNOSIS — F331 Major depressive disorder, recurrent, moderate: Secondary | ICD-10-CM | POA: Diagnosis not present

## 2019-01-14 DIAGNOSIS — S0003XA Contusion of scalp, initial encounter: Secondary | ICD-10-CM | POA: Diagnosis not present

## 2019-01-14 DIAGNOSIS — S8001XA Contusion of right knee, initial encounter: Secondary | ICD-10-CM | POA: Diagnosis not present

## 2019-01-20 DIAGNOSIS — M25562 Pain in left knee: Secondary | ICD-10-CM | POA: Diagnosis not present

## 2019-01-20 DIAGNOSIS — M25561 Pain in right knee: Secondary | ICD-10-CM | POA: Diagnosis not present

## 2019-01-20 DIAGNOSIS — S82144B Nondisplaced bicondylar fracture of right tibia, initial encounter for open fracture type I or II: Secondary | ICD-10-CM | POA: Diagnosis not present

## 2019-02-07 DIAGNOSIS — M25561 Pain in right knee: Secondary | ICD-10-CM | POA: Diagnosis not present

## 2019-02-07 DIAGNOSIS — Z6825 Body mass index (BMI) 25.0-25.9, adult: Secondary | ICD-10-CM | POA: Diagnosis not present

## 2019-02-07 DIAGNOSIS — M1711 Unilateral primary osteoarthritis, right knee: Secondary | ICD-10-CM | POA: Diagnosis not present

## 2019-02-07 DIAGNOSIS — M79671 Pain in right foot: Secondary | ICD-10-CM | POA: Diagnosis not present

## 2019-04-08 DIAGNOSIS — E782 Mixed hyperlipidemia: Secondary | ICD-10-CM | POA: Diagnosis not present

## 2019-04-08 DIAGNOSIS — I10 Essential (primary) hypertension: Secondary | ICD-10-CM | POA: Diagnosis not present

## 2019-04-08 DIAGNOSIS — K210 Gastro-esophageal reflux disease with esophagitis: Secondary | ICD-10-CM | POA: Diagnosis not present

## 2019-04-08 DIAGNOSIS — E039 Hypothyroidism, unspecified: Secondary | ICD-10-CM | POA: Diagnosis not present

## 2019-04-15 DIAGNOSIS — Z6826 Body mass index (BMI) 26.0-26.9, adult: Secondary | ICD-10-CM | POA: Diagnosis not present

## 2019-04-15 DIAGNOSIS — I10 Essential (primary) hypertension: Secondary | ICD-10-CM | POA: Diagnosis not present

## 2019-04-15 DIAGNOSIS — K589 Irritable bowel syndrome without diarrhea: Secondary | ICD-10-CM | POA: Diagnosis not present

## 2019-04-15 DIAGNOSIS — K432 Incisional hernia without obstruction or gangrene: Secondary | ICD-10-CM | POA: Diagnosis not present

## 2019-04-15 DIAGNOSIS — E039 Hypothyroidism, unspecified: Secondary | ICD-10-CM | POA: Diagnosis not present

## 2019-04-15 DIAGNOSIS — E782 Mixed hyperlipidemia: Secondary | ICD-10-CM | POA: Diagnosis not present

## 2019-04-15 DIAGNOSIS — M810 Age-related osteoporosis without current pathological fracture: Secondary | ICD-10-CM | POA: Diagnosis not present

## 2019-04-15 DIAGNOSIS — G301 Alzheimer's disease with late onset: Secondary | ICD-10-CM | POA: Diagnosis not present

## 2019-05-23 DIAGNOSIS — Z23 Encounter for immunization: Secondary | ICD-10-CM | POA: Diagnosis not present

## 2019-07-03 DIAGNOSIS — M1711 Unilateral primary osteoarthritis, right knee: Secondary | ICD-10-CM | POA: Diagnosis not present

## 2019-07-04 DIAGNOSIS — E782 Mixed hyperlipidemia: Secondary | ICD-10-CM | POA: Diagnosis not present

## 2019-07-04 DIAGNOSIS — I10 Essential (primary) hypertension: Secondary | ICD-10-CM | POA: Diagnosis not present

## 2019-07-05 DIAGNOSIS — F23 Brief psychotic disorder: Secondary | ICD-10-CM | POA: Diagnosis not present

## 2019-07-05 DIAGNOSIS — Z6826 Body mass index (BMI) 26.0-26.9, adult: Secondary | ICD-10-CM | POA: Diagnosis not present

## 2019-07-28 DIAGNOSIS — R05 Cough: Secondary | ICD-10-CM | POA: Diagnosis not present

## 2019-07-28 DIAGNOSIS — Z20828 Contact with and (suspected) exposure to other viral communicable diseases: Secondary | ICD-10-CM | POA: Diagnosis not present

## 2019-07-28 DIAGNOSIS — J329 Chronic sinusitis, unspecified: Secondary | ICD-10-CM | POA: Diagnosis not present

## 2019-08-04 DIAGNOSIS — I10 Essential (primary) hypertension: Secondary | ICD-10-CM | POA: Diagnosis not present

## 2019-08-04 DIAGNOSIS — E782 Mixed hyperlipidemia: Secondary | ICD-10-CM | POA: Diagnosis not present

## 2019-08-13 ENCOUNTER — Other Ambulatory Visit: Payer: Self-pay

## 2019-08-13 DIAGNOSIS — Z20828 Contact with and (suspected) exposure to other viral communicable diseases: Secondary | ICD-10-CM | POA: Diagnosis not present

## 2019-08-13 DIAGNOSIS — Z20822 Contact with and (suspected) exposure to covid-19: Secondary | ICD-10-CM

## 2019-08-14 LAB — NOVEL CORONAVIRUS, NAA: SARS-CoV-2, NAA: NOT DETECTED

## 2019-08-19 DIAGNOSIS — D649 Anemia, unspecified: Secondary | ICD-10-CM | POA: Diagnosis not present

## 2019-09-09 DIAGNOSIS — D519 Vitamin B12 deficiency anemia, unspecified: Secondary | ICD-10-CM | POA: Diagnosis not present

## 2019-09-09 LAB — IFOBT (OCCULT BLOOD): IFOBT: POSITIVE

## 2019-09-12 ENCOUNTER — Encounter: Payer: Self-pay | Admitting: Internal Medicine

## 2019-09-25 DIAGNOSIS — D519 Vitamin B12 deficiency anemia, unspecified: Secondary | ICD-10-CM | POA: Diagnosis not present

## 2019-09-26 ENCOUNTER — Encounter: Payer: Self-pay | Admitting: Internal Medicine

## 2019-09-26 ENCOUNTER — Other Ambulatory Visit: Payer: Self-pay

## 2019-09-26 ENCOUNTER — Ambulatory Visit (INDEPENDENT_AMBULATORY_CARE_PROVIDER_SITE_OTHER): Payer: Medicare Other | Admitting: Internal Medicine

## 2019-09-26 VITALS — BP 168/90 | HR 82 | Temp 96.9°F | Ht 66.0 in | Wt 176.0 lb

## 2019-09-26 DIAGNOSIS — R195 Other fecal abnormalities: Secondary | ICD-10-CM | POA: Diagnosis not present

## 2019-09-26 DIAGNOSIS — R1310 Dysphagia, unspecified: Secondary | ICD-10-CM | POA: Diagnosis not present

## 2019-09-26 DIAGNOSIS — R1319 Other dysphagia: Secondary | ICD-10-CM

## 2019-09-26 NOTE — Patient Instructions (Signed)
Schedule an EGD with esophageal dilation and diagnostic colonoscopy (dysphagia, IDA and hemocult positive stool) - propofol  Further recommendations to follow

## 2019-09-26 NOTE — H&P (View-Only) (Signed)
Primary Care Physician:  Caryl Bis, MD Primary Gastroenterologist:  Dr. Gala Romney  Pre-Procedure History & Physical: HPI:  [name] is a 85 y.o. female here for further evaluation of iron deficiency anemia and occult blood in stool.  [name] is a very pleasant lady with dementia who underwent a sigmoid resection for diverticulitis with abscess and 2018 by Dr. [name].  Subsequently, she had a ventral hernia repair.  Preceding her sigmoid resection, she had a colonoscopy by me for iron deficiency anemia  -  was found to have a strictured inflamed segment of left colon and multiple tubular adenomas removed.  She rallied after surgery.  She did have a transient weight loss down to 132 pounds.  But she has certainly regained her weight, weighing 176 pounds today.  He has been started on iron supplementation.  In addition, last fall, she was found to be B12 deficient and is on supplementation.  Distant history of Nissan fundoplication.  She tells me that she has had some insidious esophageal dysphagia to solid food over the past year or so.  Has no reflux symptoms, taking Protonix 40 mg daily.  She denies abdominal pain she has alternating constipation and diarrhea.  Denies melena or rectal bleeding.  Past Medical History:  Diagnosis Date  . Breast cancer ([place]) 2003  . Diverticulitis    History  . Dyslexia   . GERD (gastroesophageal reflux disease)    takes Nexium daily  . H/O hiatal hernia   . Headache(784.0)    all the time  . Heart murmur   . History of esophageal spasm   . History of IBS    no problem in past yr  . History of kidney stones   . History of shingles   . HTN (hypertension)    hx of  . Hyperlipidemia    takes Pravastatin daily  . Hypokalemia   . Hypothyroidism    takes Synthroid daily  . Impaired hearing   . Incisional hernia    Abdomen  . Incisional hernia, without obstruction or gangrene 04/22/2018  . Joint pain   . Joint swelling   .  Neck pain    pinched   . Osteoarthritis   . Osteoporosis 09/08/2011  . PONV (postoperative nausea and vomiting)   . Syncope, non cardiac   . Weakness    numbness left arm    Past Surgical History:  Procedure Laterality Date  . APPENDECTOMY    . Arm Fx     left  . BREAST LUMPECTOMY     Left  . CARDIAC CATHETERIZATION     x 3-last one in the 90's per pt  . CATARACT EXTRACTION    . CATARACT EXTRACTION W/PHACO  10/02/2011   Procedure: CATARACT EXTRACTION PHACO AND INTRAOCULAR LENS PLACEMENT (IOC);  Surgeon: [name], MD;  Location: [facility];  Service: Ophthalmology;  Laterality: Right;  CDE:17.01  . CHOLECYSTECTOMY  1997   Status post -stones  . COLONOSCOPY  2012   Dr. Gala Romney: pancolonic diverticulosis, tubular adenoma removed from cecum. next TCS 2019  . COLONOSCOPY N/A 11/17/2016   Procedure: COLONOSCOPY;  Surgeon: [name], MD;  [facility];  Service: Endoscopy;  Laterality: N/A;  1200 - moved to 3/16 @ 12:00  . EGD with dilitation     2004/2005 schatki's ring s/p dilation, wrap no longer intact on 2005 egd.  . ESOPHAGOGASTRODUODENOSCOPY N/A 11/17/2016   Procedure: ESOPHAGOGASTRODUODENOSCOPY (EGD);  Surgeon: [name], MD;  [facility];  Service: Endoscopy;  Laterality: N/A;  . INCISIONAL HERNIA REPAIR N/A 04/22/2018   Procedure: [place];  Surgeon: [name], MD;  Location: [place];  Service: General;  Laterality: N/A;  . INSERTION OF MESH N/A 04/22/2018   Procedure: INSERTION OF MESH;  Surgeon: [name], MD;  Location: [place];  Service: General;  Laterality: N/A;  . kidney stent  06/04/11   removed after about 3 weeks.  Marland Kitchen [place]  . KNEE SURGERY     bilateral  . LAPAROSCOPIC NISSEN FUNDOPLICATION  0000000  . LAPAROSCOPIC PARTIAL COLECTOMY N/A 03/09/2017   Procedure: LAPAROSCOPY, TAKEDOWN SPLENIC FLEXURE, LEFT COLECTOMY, PROCTOSCOPY;  Surgeon: [name], MD;  Location: [facility];   Service: General;  Laterality: N/A;  . LITHOTRIPSY  11/12  . MANDIBLE RECONSTRUCTION    . NECK SURGERY  2000   [facility]  . PARATHYROIDECTOMY  12/03/2013   DR TEOH  . PROCTOSCOPY  03/09/2017   Procedure: PROCTOSCOPY;  Surgeon: [name], MD;  [facility];  Service: General;;  . SHOULDER SURGERY Left   . THYROIDECTOMY     Hypothyroidism status post  . THYROIDECTOMY N/A 12/03/2013   Procedure: PARATHYROIDECTOMY;  Surgeon: [name], MD;  Location: [place];  Service: ENT;  Laterality: N/A;  . TONSILLECTOMY    . TUBAL LIGATION    . WRIST SURGERY Left 2009    Prior to Admission medications   Medication Sig Start Date End Date Taking? Authorizing Provider  acetaminophen (TYLENOL) 650 MG CR tablet Take 650-1,300 mg by mouth every 8 (eight) hours as needed for pain.   Yes [provider]  Calcium Citrate-Vitamin D (CALCIUM + D PO) Take 2 tablets by mouth daily.    Yes [provider]  ferrous sulfate 325 (65 FE) MG tablet Take 325 mg by mouth 2 (two) times daily.   Yes [provider]  latanoprost (XALATAN) 0.005 % ophthalmic solution Place 1 drop into both eyes at bedtime. 12/17/17  Yes [provider]  levothyroxine (SYNTHROID, LEVOTHROID) 88 MCG tablet Take 88 mcg by mouth daily before breakfast.   Yes [provider]  lisinopril (ZESTRIL) 10 MG tablet Take 1 tablet by mouth daily. 12/05/18 11/30/19 Yes [provider]  Multiple Vitamins-Minerals (CENTRUM SILVER ADULT 50+ PO) Take 1 capsule by mouth daily.   Yes [provider]  Multiple Vitamins-Minerals (PRESERVISION AREDS PO) Take 1 tablet by mouth 2 (two) times daily.   Yes [provider]  pantoprazole (PROTONIX) 40 MG tablet Take 40 mg by mouth daily.   Yes [provider]  pravastatin (PRAVACHOL) 20 MG tablet Take 20 mg by mouth every morning.    Yes [provider]  risperiDONE (RISPERDAL) 0.5 MG tablet Take 0.5 mg by mouth at bedtime. 08/04/19   Yes [provider]  ferrous sulfate 300 (60 Fe) MG/5ML syrup Take 5 mLs (300 mg total) by mouth daily with breakfast. Patient not taking: Reported on 09/26/2019 04/26/18   [name], MD    Allergies as of 09/26/2019 - Review Complete 09/26/2019  Allergen Reaction Noted  . Cortisone Other (See Comments) 09/26/2019  . Metronidazole Other (See Comments) 09/12/2011  . Oxycodone Other (See Comments) 11/07/2010  . Ciprofloxacin Rash 11/07/2010  . Latex Itching and Rash 11/21/2013    Family History  Problem Relation Age of Onset  . Macular degeneration Sister   . Lung cancer Sister   . Pancreatic cancer Mother   .  Heart attack Father   . Anesthesia problems Neg Hx   . Hypotension Neg Hx   . Pseudochol deficiency Neg Hx   . Malignant hyperthermia Neg Hx   . Colon cancer Neg Hx     Social History   Socioeconomic History  . Marital status: Widowed    Spouse name: Not on file  . Number of children: Not on file  . Years of education: Not on file  . Highest education level: Not on file  Occupational History  . Not on file  Tobacco Use  . Smoking status: Never Smoker  . Smokeless tobacco: Never Used  Substance and Sexual Activity  . Alcohol use: No  . Drug use: No  . Sexual activity: Never  Other Topics Concern  . Not on file  Social History Narrative  . Not on file   Social Determinants of Health   Financial Resource Strain:   . Difficulty of Paying Living Expenses: Not on file  Food Insecurity:   . Worried About Charity fundraiser in the Last Year: Not on file  . Ran Out of Food in the Last Year: Not on file  Transportation Needs:   . Lack of Transportation (Medical): Not on file  . Lack of Transportation (Non-Medical): Not on file  Physical Activity:   . Days of Exercise per Week: Not on file  . Minutes of Exercise per Session: Not on file  Stress:   . Feeling of Stress : Not on file  Social Connections:   . Frequency of Communication with Friends  and Family: Not on file  . Frequency of Social Gatherings with Friends and Family: Not on file  . Attends Religious Services: Not on file  . Active Member of Clubs or Organizations: Not on file  . Attends Archivist Meetings: Not on file  . Marital Status: Not on file  Intimate Partner Violence:   . Fear of Current or Ex-Partner: Not on file  . Emotionally Abused: Not on file  . Physically Abused: Not on file  . Sexually Abused: Not on file   Labs from Dr. Olena Heckle office 08/19/2019 white count 7.9 H&H 10.6 33.6 MCV 86 platelets 216,000 TIBC 379 iron 59 iron saturation 16 ferritin 8 B12 193 folate 12.8    Review of Systems: See HPI, otherwise negative ROS  Physical Exam: BP (!) 168/90   Pulse 82   Temp (!) 96.9 F (36.1 C) (Temporal)   Ht 5\' 6"  (1.676 m)   Wt 176 lb (79.8 kg)   BMI 28.41 kg/m  General:   Alert,  pleasant and cooperative in NAD; accompanied by her daughter and grandson  skin:  Intact without significant lesions or rashes. Neck:  Supple; no masses or thyromegaly. No significant cervical adenopathy. Lungs:  Clear throughout to auscultation.   No wheezes, crackles, or rhonchi. No acute distress. Heart:  Regular rate and rhythm; grade 3/6 systolic ejection murmur  abdomen: Non-distended, normal bowel sounds.  Soft and nontender without appreciable mass or hepatosplenomegaly.  Pulses:  Normal pulses noted. Extremities:  Without clubbing or edema. Rectal: Deferred till time of colonoscopy   Impression/Plan: 85 year old lady with iron deficiency anemia, status post sigmoid resection for diverticulitis with abscess in 2018.  This was followed by a ventral hernia repair.  Multiple colonic adenomas removed preceding her sigmoid resection. She has chronically alternating constipation and diarrhea.  No overt bleeding.  She is occult blood positive.  She also has esophageal dysphagia in the setting of  a distant Nissen fundoplication.  She gives a sketchy history  of peptic ulcer disease many years ago.  We were set to do an EGD following colonoscopy if colonoscopy were unremarkable back in 2018 but that was not done given significant colonoscopy findings.  Had a lengthy discussion about the pros and cons of further invasive evaluation in this pleasant 85 year old lady.  Discussed best practice of performing a diagnostic colonoscopy in the near future along with an EGD, in part, to further assess iron deficiency anemia and manage esophageal dysphagia. The risks, benefits, limitations, imponderables and alternatives regarding both EGD and colonoscopy have been reviewed with the patient. Questions have been answered. All parties agreeable.   Further recommendations to follow.     Notice: This dictation was prepared with Dragon dictation along with smaller phrase technology. Any transcriptional errors that result from this process are unintentional and may not be corrected upon review.

## 2019-09-26 NOTE — Progress Notes (Signed)
Primary Care Physician:  Caryl Bis, MD Primary Gastroenterologist:  Dr. Gala Romney  Pre-Procedure History & Physical: HPI:  [name] is a 85 y.o. female here for further evaluation of iron deficiency anemia and occult blood in stool.  [name] is a very pleasant lady with dementia who underwent a sigmoid resection for diverticulitis with abscess and 2018 by Dr. [name].  Subsequently, she had a ventral hernia repair.  Preceding her sigmoid resection, she had a colonoscopy by me for iron deficiency anemia  -  was found to have a strictured inflamed segment of left colon and multiple tubular adenomas removed.  She rallied after surgery.  She did have a transient weight loss down to 132 pounds.  But she has certainly regained her weight, weighing 176 pounds today.  He has been started on iron supplementation.  In addition, last fall, she was found to be B12 deficient and is on supplementation.  Distant history of Nissan fundoplication.  She tells me that she has had some insidious esophageal dysphagia to solid food over the past year or so.  Has no reflux symptoms, taking Protonix 40 mg daily.  She denies abdominal pain she has alternating constipation and diarrhea.  Denies melena or rectal bleeding.  Past Medical History:  Diagnosis Date  . Breast cancer ([place]) 2003  . Diverticulitis    History  . Dyslexia   . GERD (gastroesophageal reflux disease)    takes Nexium daily  . H/O hiatal hernia   . Headache(784.0)    all the time  . Heart murmur   . History of esophageal spasm   . History of IBS    no problem in past yr  . History of kidney stones   . History of shingles   . HTN (hypertension)    hx of  . Hyperlipidemia    takes Pravastatin daily  . Hypokalemia   . Hypothyroidism    takes Synthroid daily  . Impaired hearing   . Incisional hernia    Abdomen  . Incisional hernia, without obstruction or gangrene 04/22/2018  . Joint pain   . Joint swelling   .  Neck pain    pinched   . Osteoarthritis   . Osteoporosis 09/08/2011  . PONV (postoperative nausea and vomiting)   . Syncope, non cardiac   . Weakness    numbness left arm    Past Surgical History:  Procedure Laterality Date  . APPENDECTOMY    . Arm Fx     left  . BREAST LUMPECTOMY     Left  . CARDIAC CATHETERIZATION     x 3-last one in the 90's per pt  . CATARACT EXTRACTION    . CATARACT EXTRACTION W/PHACO  10/02/2011   Procedure: CATARACT EXTRACTION PHACO AND INTRAOCULAR LENS PLACEMENT (IOC);  Surgeon: [name], MD;  [facility];  Service: Ophthalmology;  Laterality: Right;  CDE:17.01  . CHOLECYSTECTOMY  1997   Status post -stones  . COLONOSCOPY  2012   Dr. Gala Romney: pancolonic diverticulosis, tubular adenoma removed from cecum. next TCS 2019  . COLONOSCOPY N/A 11/17/2016   Procedure: COLONOSCOPY;  Surgeon: [name], MD;  [facility];  Service: Endoscopy;  Laterality: N/A;  1200 - moved to 3/16 @ 12:00  . EGD with dilitation     2004/2005 schatki's ring s/p dilation, wrap no longer intact on 2005 egd.  . ESOPHAGOGASTRODUODENOSCOPY N/A 11/17/2016   Procedure: ESOPHAGOGASTRODUODENOSCOPY (EGD);  Surgeon: [name], MD;  [facility];  Service: Endoscopy;  Laterality: N/A;  . INCISIONAL HERNIA REPAIR N/A 04/22/2018   Procedure: [place];  Surgeon: [name], MD;  Location: [place];  Service: General;  Laterality: N/A;  . INSERTION OF MESH N/A 04/22/2018   Procedure: INSERTION OF MESH;  Surgeon: [name], MD;  Location: [place];  Service: General;  Laterality: N/A;  . kidney stent  06/04/11   removed after about 3 weeks.  Marland Kitchen [place]  . KNEE SURGERY     bilateral  . LAPAROSCOPIC NISSEN FUNDOPLICATION  0000000  . LAPAROSCOPIC PARTIAL COLECTOMY N/A 03/09/2017   Procedure: LAPAROSCOPY, TAKEDOWN SPLENIC FLEXURE, LEFT COLECTOMY, PROCTOSCOPY;  Surgeon: [name], MD;  [facility];   Service: General;  Laterality: N/A;  . LITHOTRIPSY  11/12  . MANDIBLE RECONSTRUCTION    . NECK SURGERY  2000   [facility]  . PARATHYROIDECTOMY  12/03/2013   DR TEOH  . PROCTOSCOPY  03/09/2017   Procedure: PROCTOSCOPY;  Surgeon: [name], MD;  [facility];  Service: General;;  . SHOULDER SURGERY Left   . THYROIDECTOMY     Hypothyroidism status post  . THYROIDECTOMY N/A 12/03/2013   Procedure: PARATHYROIDECTOMY;  Surgeon: [name], MD;  Location: [place];  Service: ENT;  Laterality: N/A;  . TONSILLECTOMY    . TUBAL LIGATION    . WRIST SURGERY Left 2009    Prior to Admission medications   Medication Sig Start Date End Date Taking? Authorizing Provider  acetaminophen (TYLENOL) 650 MG CR tablet Take 650-1,300 mg by mouth every 8 (eight) hours as needed for pain.   Yes [provider]  Calcium Citrate-Vitamin D (CALCIUM + D PO) Take 2 tablets by mouth daily.    Yes [provider]  ferrous sulfate 325 (65 FE) MG tablet Take 325 mg by mouth 2 (two) times daily.   Yes [provider]  latanoprost (XALATAN) 0.005 % ophthalmic solution Place 1 drop into both eyes at bedtime. 12/17/17  Yes [provider]  levothyroxine (SYNTHROID, LEVOTHROID) 88 MCG tablet Take 88 mcg by mouth daily before breakfast.   Yes [provider]  lisinopril (ZESTRIL) 10 MG tablet Take 1 tablet by mouth daily. 12/05/18 11/30/19 Yes [provider]  Multiple Vitamins-Minerals (CENTRUM SILVER ADULT 50+ PO) Take 1 capsule by mouth daily.   Yes [provider]  Multiple Vitamins-Minerals (PRESERVISION AREDS PO) Take 1 tablet by mouth 2 (two) times daily.   Yes [provider]  pantoprazole (PROTONIX) 40 MG tablet Take 40 mg by mouth daily.   Yes [provider]  pravastatin (PRAVACHOL) 20 MG tablet Take 20 mg by mouth every morning.    Yes [provider]  risperiDONE (RISPERDAL) 0.5 MG tablet Take 0.5 mg by mouth at bedtime. 08/04/19   Yes [provider]  ferrous sulfate 300 (60 Fe) MG/5ML syrup Take 5 mLs (300 mg total) by mouth daily with breakfast. Patient not taking: Reported on 09/26/2019 04/26/18   [name], MD    Allergies as of 09/26/2019 - Review Complete 09/26/2019  Allergen Reaction Noted  . Cortisone Other (See Comments) 09/26/2019  . Metronidazole Other (See Comments) 09/12/2011  . Oxycodone Other (See Comments) 11/07/2010  . Ciprofloxacin Rash 11/07/2010  . Latex Itching and Rash 11/21/2013    Family History  Problem Relation Age of Onset  . Macular degeneration Sister   . Lung cancer Sister   . Pancreatic cancer Mother   .  Heart attack Father   . Anesthesia problems Neg Hx   . Hypotension Neg Hx   . Pseudochol deficiency Neg Hx   . Malignant hyperthermia Neg Hx   . Colon cancer Neg Hx     Social History   Socioeconomic History  . Marital status: Widowed    Spouse name: Not on file  . Number of children: Not on file  . Years of education: Not on file  . Highest education level: Not on file  Occupational History  . Not on file  Tobacco Use  . Smoking status: Never Smoker  . Smokeless tobacco: Never Used  Substance and Sexual Activity  . Alcohol use: No  . Drug use: No  . Sexual activity: Never  Other Topics Concern  . Not on file  Social History Narrative  . Not on file   Social Determinants of Health   Financial Resource Strain:   . Difficulty of Paying Living Expenses: Not on file  Food Insecurity:   . Worried About Charity fundraiser in the Last Year: Not on file  . Ran Out of Food in the Last Year: Not on file  Transportation Needs:   . Lack of Transportation (Medical): Not on file  . Lack of Transportation (Non-Medical): Not on file  Physical Activity:   . Days of Exercise per Week: Not on file  . Minutes of Exercise per Session: Not on file  Stress:   . Feeling of Stress : Not on file  Social Connections:   . Frequency of Communication with Friends  and Family: Not on file  . Frequency of Social Gatherings with Friends and Family: Not on file  . Attends Religious Services: Not on file  . Active Member of Clubs or Organizations: Not on file  . Attends Archivist Meetings: Not on file  . Marital Status: Not on file  Intimate Partner Violence:   . Fear of Current or Ex-Partner: Not on file  . Emotionally Abused: Not on file  . Physically Abused: Not on file  . Sexually Abused: Not on file   Labs from Dr. Olena Heckle office 08/19/2019 white count 7.9 H&H 10.6 33.6 MCV 86 platelets 216,000 TIBC 379 iron 59 iron saturation 16 ferritin 8 B12 193 folate 12.8    Review of Systems: See HPI, otherwise negative ROS  Physical Exam: BP (!) 168/90   Pulse 82   Temp (!) 96.9 F (36.1 C) (Temporal)   Ht 5\' 6"  (1.676 m)   Wt 176 lb (79.8 kg)   BMI 28.41 kg/m  General:   Alert,  pleasant and cooperative in NAD; accompanied by her daughter and grandson  skin:  Intact without significant lesions or rashes. Neck:  Supple; no masses or thyromegaly. No significant cervical adenopathy. Lungs:  Clear throughout to auscultation.   No wheezes, crackles, or rhonchi. No acute distress. Heart:  Regular rate and rhythm; grade 3/6 systolic ejection murmur  abdomen: Non-distended, normal bowel sounds.  Soft and nontender without appreciable mass or hepatosplenomegaly.  Pulses:  Normal pulses noted. Extremities:  Without clubbing or edema. Rectal: Deferred till time of colonoscopy   Impression/Plan: 85 year old lady with iron deficiency anemia, status post sigmoid resection for diverticulitis with abscess in 2018.  This was followed by a ventral hernia repair.  Multiple colonic adenomas removed preceding her sigmoid resection. She has chronically alternating constipation and diarrhea.  No overt bleeding.  She is occult blood positive.  She also has esophageal dysphagia in the setting of  a distant Nissen fundoplication.  She gives a sketchy history  of peptic ulcer disease many years ago.  We were set to do an EGD following colonoscopy if colonoscopy were unremarkable back in 2018 but that was not done given significant colonoscopy findings.  Had a lengthy discussion about the pros and cons of further invasive evaluation in this pleasant 85 year old lady.  Discussed best practice of performing a diagnostic colonoscopy in the near future along with an EGD, in part, to further assess iron deficiency anemia and manage esophageal dysphagia. The risks, benefits, limitations, imponderables and alternatives regarding both EGD and colonoscopy have been reviewed with the patient. Questions have been answered. All parties agreeable.   Further recommendations to follow.     Notice: This dictation was prepared with Dragon dictation along with smaller phrase technology. Any transcriptional errors that result from this process are unintentional and may not be corrected upon review.

## 2019-09-30 ENCOUNTER — Other Ambulatory Visit: Payer: Self-pay

## 2019-09-30 ENCOUNTER — Telehealth: Payer: Self-pay

## 2019-09-30 MED ORDER — PEG 3350-KCL-NA BICARB-NACL 420 G PO SOLR: 4000.0000 mL | ORAL | 0 refills | Status: DC

## 2019-09-30 NOTE — Telephone Encounter (Signed)
Called daughter [name]), TCS/EGD/DIL w/Propofol w/RMR scheduled for 10/09/19 at 1:15pm. Daughter aware pt will need to hold Iron for 7 days prior to procedure. Rx for prep sent to pharmacy. Orders entered.

## 2019-09-30 NOTE — Telephone Encounter (Signed)
Called and informed daughter of pre-op and COVID test appt. 10/07/19. Letter mailed with procedure instructions. Daughter has access to pt's 60.

## 2019-10-03 DIAGNOSIS — E7849 Other hyperlipidemia: Secondary | ICD-10-CM | POA: Diagnosis not present

## 2019-10-03 DIAGNOSIS — I10 Essential (primary) hypertension: Secondary | ICD-10-CM | POA: Diagnosis not present

## 2019-10-03 NOTE — Patient Instructions (Signed)
[name]  10/03/2019     @PREFPERIOPPHARMACY @   Your procedure is scheduled on  10/09/2019  Report to [facility] at  1145  A.M.  Call this number if you have problems the morning of surgery:  [phone]   Remember:   Follow the diet and prep instructions given you by Dr Roseanne Kaufman    Take these medicines the morning of surgery with A SIP OF WATER  Levothyroxine, lisinopril, protonix, tramadol(if needed).    Do not wear jewelry, make-up or nail polish.  Do not wear lotions, powders, or perfumes. Please wear deodorant and brush your teeth.  Do not shave 48 hours prior to surgery.  Men may shave face and neck.  Do not bring valuables to the hospital.  [facility] is not responsible for any belongings or valuables.  Contacts, dentures or bridgework may not be worn into surgery.  Leave your suitcase in the car.  After surgery it may be brought to your room.  For patients admitted to the hospital, discharge time will be determined by your treatment team.  Patients discharged the day of surgery will not be allowed to drive home.   Name and phone number of your driver:   family Special instructions:  None  Please read over the following fact sheets that you were given. Anesthesia Post-op Instructions and Care and Recovery After Surgery       Upper Endoscopy, Adult, Care After This sheet gives you information about how to care for yourself after your procedure. Your health care provider may also give you more specific instructions. If you have problems or questions, contact your health care provider. What can I expect after the procedure? After the procedure, it is common to have:  A sore throat.  Mild stomach pain or discomfort.  Bloating.  Nausea. Follow these instructions at home:   Follow instructions from your health care provider about what to eat or drink after your procedure.  Return to your normal activities as told by your health care  provider. Ask your health care provider what activities are safe for you.  Take over-the-counter and prescription medicines only as told by your health care provider.  Do not drive for 24 hours if you were given a sedative during your procedure.  Keep all follow-up visits as told by your health care provider. This is important. Contact a health care provider if you have:  A sore throat that lasts longer than one day.  Trouble swallowing. Get help right away if:  You vomit blood or your vomit looks like coffee grounds.  You have: ? A fever. ? Bloody, black, or tarry stools. ? A severe sore throat or you cannot swallow. ? Difficulty breathing. ? Severe pain in your chest or abdomen. Summary  After the procedure, it is common to have a sore throat, mild stomach discomfort, bloating, and nausea.  Do not drive for 24 hours if you were given a sedative during the procedure.  Follow instructions from your health care provider about what to eat or drink after your procedure.  Return to your normal activities as told by your health care provider. This information is not intended to replace advice given to you by your health care provider. Make sure you discuss any questions you have with your health care provider. Document Revised: 02/12/2018 Document Reviewed: 01/21/2018 Elsevier Patient Education  [place].  Esophageal Dilatation Esophageal dilatation, also called esophageal dilation,  is a procedure to widen or open (dilate) a blocked or narrowed part of the esophagus. The esophagus is the part of the body that moves food and liquid from the mouth to the stomach. You may need this procedure if:  You have a buildup of scar tissue in your esophagus that makes it difficult, painful, or impossible to swallow. This can be caused by gastroesophageal reflux disease (GERD).  You have cancer of the esophagus.  There is a problem with how food moves through your esophagus. In some  cases, you may need this procedure repeated at a later time to dilate the esophagus gradually. Tell a health care provider about:  Any allergies you have.  All medicines you are taking, including vitamins, herbs, eye drops, creams, and over-the-counter medicines.  Any problems you or family members have had with anesthetic medicines.  Any blood disorders you have.  Any surgeries you have had.  Any medical conditions you have.  Any antibiotic medicines you are required to take before dental procedures.  Whether you are pregnant or may be pregnant. What are the risks? Generally, this is a safe procedure. However, problems may occur, including:  Bleeding due to a tear in the lining of the esophagus.  A hole (perforation) in the esophagus. What happens before the procedure?  Follow instructions from your health care provider about eating or drinking restrictions.  Ask your health care provider about changing or stopping your regular medicines. This is especially important if you are taking diabetes medicines or blood thinners.  Plan to have someone take you home from the hospital or clinic.  Plan to have a responsible adult care for you for at least 24 hours after you leave the hospital or clinic. This is important. What happens during the procedure?  You may be given a medicine to help you relax (sedative).  A numbing medicine may be sprayed into the back of your throat, or you may gargle the medicine.  Your health care provider may perform the dilatation using various surgical instruments, such as: ? Simple dilators. This instrument is carefully placed in the esophagus to stretch it. ? Guided wire bougies. This involves using an endoscope to insert a wire into the esophagus. A dilator is passed over this wire to enlarge the esophagus. Then the wire is removed. ? Balloon dilators. An endoscope with a small balloon at the end is inserted into the esophagus. The balloon is  inflated to stretch the esophagus and open it up. The procedure may vary among health care providers and hospitals. What happens after the procedure?  Your blood pressure, heart rate, breathing rate, and blood oxygen level will be monitored until the medicines you were given have worn off.  Your throat may feel slightly sore and numb. This will improve slowly over time.  You will not be allowed to eat or drink until your throat is no longer numb.  When you are able to drink, urinate, and sit on the edge of the bed without nausea or dizziness, you may be able to return home. Follow these instructions at home:  Take over-the-counter and prescription medicines only as told by your health care provider.  Do not drive for 24 hours if you were given a sedative during your procedure.  You should have a responsible adult with you for 24 hours after the procedure.  Follow instructions from your health care provider about any eating or drinking restrictions.  Do not use any products that contain nicotine or  tobacco, such as cigarettes and e-cigarettes. If you need help quitting, ask your health care provider.  Keep all follow-up visits as told by your health care provider. This is important. Get help right away if you:  Have a fever.  Have chest pain.  Have pain that is not relieved by medication.  Have trouble breathing.  Have trouble swallowing.  Vomit blood. Summary  Esophageal dilatation, also called esophageal dilation, is a procedure to widen or open (dilate) a blocked or narrowed part of the esophagus.  Plan to have someone take you home from the hospital or clinic.  For this procedure, a numbing medicine may be sprayed into the back of your throat, or you may gargle the medicine.  Do not drive for 24 hours if you were given a sedative during your procedure. This information is not intended to replace advice given to you by your health care provider. Make sure you discuss  any questions you have with your health care provider. Document Revised: 06/18/2019 Document Reviewed: 06/26/2017 Elsevier Patient Education  [place].  Colonoscopy, Adult, Care After This sheet gives you information about how to care for yourself after your procedure. Your health care provider may also give you more specific instructions. If you have problems or questions, contact your health care provider. What can I expect after the procedure? After the procedure, it is common to have:  A small amount of blood in your stool for 24 hours after the procedure.  Some gas.  Mild cramping or bloating of your abdomen. Follow these instructions at home: Eating and drinking   Drink enough fluid to keep your urine pale yellow.  Follow instructions from your health care provider about eating or drinking restrictions.  Resume your normal diet as instructed by your health care provider. Avoid heavy or fried foods that are hard to digest. Activity  Rest as told by your health care provider.  Avoid sitting for a long time without moving. Get up to take short walks every 1-2 hours. This is important to improve blood flow and breathing. Ask for help if you feel weak or unsteady.  Return to your normal activities as told by your health care provider. Ask your health care provider what activities are safe for you. Managing cramping and bloating   Try walking around when you have cramps or feel bloated.  Apply heat to your abdomen as told by your health care provider. Use the heat source that your health care provider recommends, such as a moist heat pack or a heating pad. ? Place a towel between your skin and the heat source. ? Leave the heat on for 20-30 minutes. ? Remove the heat if your skin turns bright red. This is especially important if you are unable to feel pain, heat, or cold. You may have a greater risk of getting burned. General instructions  For the first 24 hours  after the procedure: ? Do not drive or use machinery. ? Do not sign important documents. ? Do not drink alcohol. ? Do your regular daily activities at a slower pace than normal. ? Eat soft foods that are easy to digest.  Take over-the-counter and prescription medicines only as told by your health care provider.  Keep all follow-up visits as told by your health care provider. This is important. Contact a health care provider if:  You have blood in your stool 2-3 days after the procedure. Get help right away if you have:  More than a small  spotting of blood in your stool.  Large blood clots in your stool.  Swelling of your abdomen.  Nausea or vomiting.  A fever.  Increasing pain in your abdomen that is not relieved with medicine. Summary  After the procedure, it is common to have a small amount of blood in your stool. You may also have mild cramping and bloating of your abdomen.  For the first 24 hours after the procedure, do not drive or use machinery, sign important documents, or drink alcohol.  Get help right away if you have a lot of blood in your stool, nausea or vomiting, a fever, or increased pain in your abdomen. This information is not intended to replace advice given to you by your health care provider. Make sure you discuss any questions you have with your health care provider. Document Revised: 03/17/2019 Document Reviewed: 03/17/2019 Elsevier Patient Education  [place] After These instructions provide you with information about caring for yourself after your procedure. Your health care provider may also give you more specific instructions. Your treatment has been planned according to current medical practices, but problems sometimes occur. Call your health care provider if you have any problems or questions after your procedure. What can I expect after the procedure? After your procedure, you may:  Feel sleepy for several  hours.  Feel clumsy and have poor balance for several hours.  Feel forgetful about what happened after the procedure.  Have poor judgment for several hours.  Feel nauseous or vomit.  Have a sore throat if you had a breathing tube during the procedure. Follow these instructions at home: For at least 24 hours after the procedure:      Have a responsible adult stay with you. It is important to have someone help care for you until you are awake and alert.  Rest as needed.  Do not: ? Participate in activities in which you could fall or become injured. ? Drive. ? Use heavy machinery. ? Drink alcohol. ? Take sleeping pills or medicines that cause drowsiness. ? Make important decisions or sign legal documents. ? Take care of children on your own. Eating and drinking  Follow the diet that is recommended by your health care provider.  If you vomit, drink water, juice, or soup when you can drink without vomiting.  Make sure you have little or no nausea before eating solid foods. General instructions  Take over-the-counter and prescription medicines only as told by your health care provider.  If you have sleep apnea, surgery and certain medicines can increase your risk for breathing problems. Follow instructions from your health care provider about wearing your sleep device: ? Anytime you are sleeping, including during daytime naps. ? While taking prescription pain medicines, sleeping medicines, or medicines that make you drowsy.  If you smoke, do not smoke without supervision.  Keep all follow-up visits as told by your health care provider. This is important. Contact a health care provider if:  You keep feeling nauseous or you keep vomiting.  You feel light-headed.  You develop a rash.  You have a fever. Get help right away if:  You have trouble breathing. Summary  For several hours after your procedure, you may feel sleepy and have poor judgment.  Have a  responsible adult stay with you for at least 24 hours or until you are awake and alert. This information is not intended to replace advice given to you by your health care provider. Make sure  you discuss any questions you have with your health care provider. Document Revised: 11/19/2017 Document Reviewed: 12/12/2015 Elsevier Patient Education  [place].

## 2019-10-07 ENCOUNTER — Other Ambulatory Visit (HOSPITAL_COMMUNITY)
Admission: RE | Admit: 2019-10-07 | Discharge: 2019-10-07 | Disposition: A | Discharge status: Home / Self Care | Payer: Medicare Other | Source: Ambulatory Visit | Attending: Internal Medicine | Admitting: Internal Medicine

## 2019-10-07 ENCOUNTER — Other Ambulatory Visit: Payer: Self-pay

## 2019-10-07 ENCOUNTER — Encounter (HOSPITAL_COMMUNITY): Payer: Self-pay

## 2019-10-07 ENCOUNTER — Encounter (HOSPITAL_COMMUNITY)
Admission: RE | Admit: 2019-10-07 | Discharge: 2019-10-07 | Disposition: A | Discharge status: Home / Self Care | Payer: Medicare Other | Source: Ambulatory Visit | Attending: Internal Medicine | Admitting: Internal Medicine

## 2019-10-07 DIAGNOSIS — Z20822 Contact with and (suspected) exposure to covid-19: Secondary | ICD-10-CM | POA: Diagnosis not present

## 2019-10-07 DIAGNOSIS — Z01812 Encounter for preprocedural laboratory examination: Secondary | ICD-10-CM | POA: Diagnosis not present

## 2019-10-07 LAB — CBC WITH DIFFERENTIAL/PLATELET
Abs Immature Granulocytes: 0.02 10*3/uL (ref 0.00–0.07)
Basophils Absolute: 0 10*3/uL (ref 0.0–0.1)
Basophils Relative: 0 %
Eosinophils Absolute: 0.1 10*3/uL (ref 0.0–0.5)
Eosinophils Relative: 2 %
HCT: 35 % — ABNORMAL LOW (ref 36.0–46.0)
Hemoglobin: 10.7 g/dL — ABNORMAL LOW (ref 12.0–15.0)
Immature Granulocytes: 0 %
Lymphocytes Relative: 29 %
Lymphs Abs: 1.9 10*3/uL (ref 0.7–4.0)
MCH: 27.2 pg (ref 26.0–34.0)
MCHC: 30.6 g/dL (ref 30.0–36.0)
MCV: 89.1 fL (ref 80.0–100.0)
Monocytes Absolute: 0.6 10*3/uL (ref 0.1–1.0)
Monocytes Relative: 9 %
Neutro Abs: 4 10*3/uL (ref 1.7–7.7)
Neutrophils Relative %: 60 %
Platelets: 223 10*3/uL (ref 150–400)
RBC: 3.93 MIL/uL (ref 3.87–5.11)
RDW: 13.2 % (ref 11.5–15.5)
WBC: 6.7 10*3/uL (ref 4.0–10.5)
nRBC: 0 % (ref 0.0–0.2)

## 2019-10-07 LAB — BASIC METABOLIC PANEL
Anion gap: 8 (ref 5–15)
BUN: 21 mg/dL (ref 8–23)
CO2: 28 mmol/L (ref 22–32)
Calcium: 8.8 mg/dL — ABNORMAL LOW (ref 8.9–10.3)
Chloride: 106 mmol/L (ref 98–111)
Creatinine, Ser: 0.93 mg/dL (ref 0.44–1.00)
GFR calc Af Amer: >60 mL/min (ref >60)
GFR calc non Af Amer: 57 mL/min — ABNORMAL LOW (ref >60)
Glucose, Bld: 106 mg/dL — ABNORMAL HIGH (ref 70–99)
Potassium: 3.5 mmol/L (ref 3.5–5.1)
Sodium: 142 mmol/L (ref 135–145)

## 2019-10-07 LAB — SARS CORONAVIRUS 2 (TAT 6-24 HRS): SARS Coronavirus 2: NEGATIVE

## 2019-10-08 ENCOUNTER — Telehealth: Payer: Self-pay

## 2019-10-08 NOTE — Telephone Encounter (Signed)
Pt's daughter called concerned about her mothers EKG that was done yesterday at [facility], prior to pts procedure with [facility] tomorrow 10/09/2019. Pt's daughter would like [facility] to look at the EKG to see if there is something alarming on it. Please advise.

## 2019-10-08 NOTE — Pre-Procedure Instructions (Signed)
Dr Gala Romney wants past and present EKG to be seen by Dr Hilaria Ota to see if we are okay to proceed with procedure tomorrow. Dr Brandon Melnick reviewed and is okay with it. Note also sent to Dr Gala Romney to let him know.

## 2019-10-08 NOTE — Telephone Encounter (Signed)
Call has been address by RMR. Contacting pts daughter advising a flu with cardiologist and ok to proceed with procedure per [facility].

## 2019-10-09 ENCOUNTER — Encounter (HOSPITAL_COMMUNITY): Payer: Self-pay | Admitting: Internal Medicine

## 2019-10-09 ENCOUNTER — Ambulatory Visit (HOSPITAL_COMMUNITY): Payer: Medicare Other | Admitting: Anesthesiology

## 2019-10-09 ENCOUNTER — Encounter (HOSPITAL_COMMUNITY)
Admission: RE | Disposition: A | Discharge status: Home / Self Care | Payer: Self-pay | Source: Home / Self Care | Attending: Internal Medicine

## 2019-10-09 ENCOUNTER — Ambulatory Visit (HOSPITAL_COMMUNITY)
Admission: RE | Admit: 2019-10-09 | Discharge: 2019-10-09 | Disposition: A | Discharge status: Home / Self Care | Payer: Medicare Other | Attending: Internal Medicine | Admitting: Internal Medicine

## 2019-10-09 ENCOUNTER — Other Ambulatory Visit: Payer: Self-pay

## 2019-10-09 DIAGNOSIS — D509 Iron deficiency anemia, unspecified: Secondary | ICD-10-CM | POA: Diagnosis not present

## 2019-10-09 DIAGNOSIS — D122 Benign neoplasm of ascending colon: Secondary | ICD-10-CM | POA: Diagnosis not present

## 2019-10-09 DIAGNOSIS — K222 Esophageal obstruction: Secondary | ICD-10-CM | POA: Insufficient documentation

## 2019-10-09 DIAGNOSIS — Z9049 Acquired absence of other specified parts of digestive tract: Secondary | ICD-10-CM | POA: Insufficient documentation

## 2019-10-09 DIAGNOSIS — E538 Deficiency of other specified B group vitamins: Secondary | ICD-10-CM | POA: Diagnosis not present

## 2019-10-09 DIAGNOSIS — Z8249 Family history of ischemic heart disease and other diseases of the circulatory system: Secondary | ICD-10-CM | POA: Diagnosis not present

## 2019-10-09 DIAGNOSIS — K5730 Diverticulosis of large intestine without perforation or abscess without bleeding: Secondary | ICD-10-CM | POA: Insufficient documentation

## 2019-10-09 DIAGNOSIS — H9190 Unspecified hearing loss, unspecified ear: Secondary | ICD-10-CM | POA: Insufficient documentation

## 2019-10-09 DIAGNOSIS — F0390 Unspecified dementia without behavioral disturbance: Secondary | ICD-10-CM | POA: Diagnosis not present

## 2019-10-09 DIAGNOSIS — M1990 Unspecified osteoarthritis, unspecified site: Secondary | ICD-10-CM | POA: Insufficient documentation

## 2019-10-09 DIAGNOSIS — Z86010 Personal history of colonic polyps: Secondary | ICD-10-CM | POA: Insufficient documentation

## 2019-10-09 DIAGNOSIS — K588 Other irritable bowel syndrome: Secondary | ICD-10-CM | POA: Insufficient documentation

## 2019-10-09 DIAGNOSIS — K219 Gastro-esophageal reflux disease without esophagitis: Secondary | ICD-10-CM | POA: Insufficient documentation

## 2019-10-09 DIAGNOSIS — Z8711 Personal history of peptic ulcer disease: Secondary | ICD-10-CM | POA: Diagnosis not present

## 2019-10-09 DIAGNOSIS — K449 Diaphragmatic hernia without obstruction or gangrene: Secondary | ICD-10-CM | POA: Diagnosis not present

## 2019-10-09 DIAGNOSIS — R1310 Dysphagia, unspecified: Secondary | ICD-10-CM | POA: Diagnosis present

## 2019-10-09 DIAGNOSIS — K9189 Other postprocedural complications and disorders of digestive system: Secondary | ICD-10-CM | POA: Diagnosis not present

## 2019-10-09 DIAGNOSIS — I10 Essential (primary) hypertension: Secondary | ICD-10-CM | POA: Diagnosis not present

## 2019-10-09 DIAGNOSIS — Z79890 Hormone replacement therapy (postmenopausal): Secondary | ICD-10-CM | POA: Insufficient documentation

## 2019-10-09 DIAGNOSIS — R195 Other fecal abnormalities: Secondary | ICD-10-CM | POA: Insufficient documentation

## 2019-10-09 DIAGNOSIS — E785 Hyperlipidemia, unspecified: Secondary | ICD-10-CM | POA: Insufficient documentation

## 2019-10-09 DIAGNOSIS — E890 Postprocedural hypothyroidism: Secondary | ICD-10-CM | POA: Insufficient documentation

## 2019-10-09 DIAGNOSIS — Z853 Personal history of malignant neoplasm of breast: Secondary | ICD-10-CM | POA: Diagnosis not present

## 2019-10-09 HISTORY — PX: ESOPHAGOGASTRODUODENOSCOPY (EGD) WITH PROPOFOL: SHX5813

## 2019-10-09 HISTORY — DX: Unspecified dementia, unspecified severity, without behavioral disturbance, psychotic disturbance, mood disturbance, and anxiety: F03.90

## 2019-10-09 HISTORY — PX: MALONEY DILATION: SHX5535

## 2019-10-09 HISTORY — PX: COLONOSCOPY WITH PROPOFOL: SHX5780

## 2019-10-09 HISTORY — PX: HEMOSTASIS CLIP PLACEMENT: SHX6857

## 2019-10-09 HISTORY — PX: POLYPECTOMY: SHX5525

## 2019-10-09 SURGERY — COLONOSCOPY WITH PROPOFOL
Anesthesia: General

## 2019-10-09 MED ORDER — CHLORHEXIDINE GLUCONATE CLOTH 2 % EX PADS: 6.0000 | MEDICATED_PAD | Freq: Once | CUTANEOUS | Status: DC

## 2019-10-09 MED ORDER — PROMETHAZINE HCL 25 MG/ML IJ SOLN: 6.2500 mg | INTRAMUSCULAR | Status: DC | PRN

## 2019-10-09 MED ORDER — MIDAZOLAM HCL 2 MG/2ML IJ SOLN: 0.5000 mg | Freq: Once | INTRAMUSCULAR | Status: DC | PRN

## 2019-10-09 MED ORDER — PROPOFOL 500 MG/50ML IV EMUL
INTRAVENOUS | Status: DC | PRN
  Administered 2019-10-09: 150 ug/kg/min via INTRAVENOUS

## 2019-10-09 MED ORDER — LACTATED RINGERS IV SOLN
INTRAVENOUS | Status: DC
  Administered 2019-10-09: 1000 mL via INTRAVENOUS

## 2019-10-09 MED ORDER — PROPOFOL 10 MG/ML IV BOLUS
INTRAVENOUS | Status: DC | PRN
  Administered 2019-10-09: 60 mg via INTRAVENOUS

## 2019-10-09 NOTE — Interval H&P Note (Signed)
History and Physical Interval Note:  10/09/2019 2:06 PM  [name]  has presented today for surgery, with the diagnosis of dysphagia, iron deficiency anemia, homocult positive stool.  The various methods of treatment have been discussed with the patient and family. After consideration of risks, benefits and other options for treatment, the patient has consented to  Procedure(s) with comments: COLONOSCOPY WITH PROPOFOL (N/A) - 1:15pm ESOPHAGOGASTRODUODENOSCOPY (EGD) WITH PROPOFOL (N/A) MALONEY DILATION (N/A) as a surgical intervention.  The patient's history has been reviewed, patient examined, no change in status, stable for surgery.  I have reviewed the patient's chart and labs.  Questions were answered to the patient's satisfaction.     Keyvon Herter   No change.  Fascicular block reviewed with anesthesia and discussed with patient and daughter.  Plan for EGD with esophageal dilation as feasible/appropriate as well as diagnostic colonoscopy today per plan.  The risks, benefits, limitations, alternatives and imponderables have been reviewed with the patient. Questions have been answered. All parties are agreeable.

## 2019-10-09 NOTE — Op Note (Addendum)
[facility] Patient Name: [name] Procedure Date: 10/09/2019 2:28 PM MRN: [id number] Date of Birth: 1935-05-09 Attending MD: Norvel Richards , MD CSN: [id number] Age: 85 Admit Type: Outpatient Procedure:                Colonoscopy Indications:              Unexplained iron deficiency anemia Providers:                Norvel Richards, MD, Otis Peak B. Sharon Seller, RN,                            Aram Candela Referring MD:              Medicines:                Propofol per Anesthesia Complications:            No immediate complications. Estimated Blood Loss:     Estimated blood loss was minimal. Procedure:                Pre-Anesthesia Assessment:                           - Prior to the procedure, a History and Physical                            was performed, and patient medications and                            allergies were reviewed. The patient's tolerance of                            previous anesthesia was also reviewed. The risks                            and benefits of the procedure and the sedation                            options and risks were discussed with the patient.                            All questions were answered, and informed consent                            was obtained. Prior Anticoagulants: The patient has                            taken no previous anticoagulant or antiplatelet                            agents. ASA Grade Assessment: III - A patient with                            severe systemic disease. After reviewing the risks  and benefits, the patient was deemed in                            satisfactory condition to undergo the procedure.                           After obtaining informed consent, the colonoscope                            was passed under direct vision. Throughout the                            procedure, the patient's blood pressure, pulse, and                            oxygen  saturations were monitored continuously. The                            [id number] scope was introduced through                            the anus and advanced to the the cecum, identified                            by appendiceal orifice and ileocecal valve. Scope In: 2:31:41 PM Scope Out: 2:55:36 PM Scope Withdrawal Time: 0 hours 19 minutes 6 seconds  Total Procedure Duration: 0 hours 23 minutes 55 seconds  Findings:      The perianal and digital rectal examinations were normal.      A 15 mm polyp was found in the ascending colon. The polyp was       semi-pedunculated. The polyp was removed with a hot snare. Resection and       retrieval were complete. Estimated blood loss: none. A net of 2 clips       placed on polypectomy site to ensure hemostasis.      A 5 mm polyp was found in the ascending colon. The polyp was sessile.       The polyp was removed with a cold snare. Resection and retrieval were       complete. Estimated blood loss was minimal.      Scattered small and large-mouthed diverticula were found in the entire       colon. Surgical anastomosis identified approximately 20 cm from the anal       verge.      The exam was otherwise without abnormality on direct and retroflexion       views. Impression:               - One 15 mm polyp in the ascending colon, removed                            with a hot snare. Resected and retrieved. Clips                            placed                           -  One 5 mm polyp in the ascending colon, removed                            with a cold snare. Resected and retrieved.                           - Diverticulosis in the entire examined colon.                            Surgical anastomosis consistent with prior history                            of sigmoid resection.                           - The examination was otherwise normal on direct                            and retroflexion views. Moderate Sedation:       Moderate (conscious) sedation was personally administered by an       anesthesia professional. The following parameters were monitored: oxygen       saturation, heart rate, blood pressure, respiratory rate, EKG, adequacy       of pulmonary ventilation, and response to care. Recommendation:           - Patient has a contact number available for                            emergencies. The signs and symptoms of potential                            delayed complications were discussed with the                            patient. Return to normal activities tomorrow.                            Written discharge instructions were provided to the                            patient.                           - Advance diet as tolerated.                           - Continue present medications.                           - No repeat colonoscopy due to age. Follow up on                            pathology. See EGD report                           - Return to GI  clinic in 2 months. No future MRI                            until clips gone Procedure Code(s):        --- Professional ---                           [phone], Colonoscopy, flexible; with removal of                            tumor(s), polyp(s), or other lesion(s) by snare                            technique Diagnosis Code(s):        --- Professional ---                           K63.5, Polyp of colon                           D50.9, Iron deficiency anemia, unspecified                           K57.30, Diverticulosis of large intestine without                            perforation or abscess without bleeding CPT copyright 2019 American Medical Association. All rights reserved. The codes documented in this report are preliminary and upon coder review may  be revised to meet current compliance requirements. [name]. Belinda Sens, MD Norvel Richards, MD 10/09/2019 3:03:45 PM This report has been signed electronically. Number of Addenda: 0

## 2019-10-09 NOTE — Anesthesia Postprocedure Evaluation (Signed)
Anesthesia Post Note  Patient: [name]  Procedure(s) Performed: COLONOSCOPY WITH PROPOFOL (N/A ) ESOPHAGOGASTRODUODENOSCOPY (EGD) WITH PROPOFOL (N/A ) MALONEY DILATION (N/A ) POLYPECTOMY HEMOSTASIS CLIP PLACEMENT  Patient location during evaluation: PACU Anesthesia Type: MAC Level of consciousness: awake, oriented and awake and alert Pain management: pain level controlled Vital Signs Assessment: post-procedure vital signs reviewed and stable Respiratory status: spontaneous breathing, respiratory function stable and nonlabored ventilation Cardiovascular status: stable Postop Assessment: no apparent nausea or vomiting Anesthetic complications: no     Last Vitals:  Vitals:   10/09/19 1242  BP: (!) 146/85  Pulse: 81  Resp: 19  Temp: 36.8 C  SpO2: 95%    Last Pain:  Vitals:   10/09/19 1417  TempSrc:   PainSc: 4                  Makiah Foye

## 2019-10-09 NOTE — Discharge Instructions (Signed)
Colonoscopy Discharge Instructions  Read the instructions outlined below and refer to this sheet in the next few weeks. These discharge instructions provide you with general information on caring for yourself after you leave the hospital. Your doctor may also give you specific instructions. While your treatment has been planned according to the most current medical practices available, unavoidable complications occasionally occur. If you have any problems or questions after discharge, call Dr. Gala Romney at [phone]. ACTIVITY  You may resume your regular activity, but move at a slower pace for the next 24 hours.   Take frequent rest periods for the next 24 hours.   Walking will help get rid of the air and reduce the bloated feeling in your belly (abdomen).   No driving for 24 hours (because of the medicine (anesthesia) used during the test).    Do not sign any important legal documents or operate any machinery for 24 hours (because of the anesthesia used during the test).  NUTRITION  Drink plenty of fluids.   You may resume your normal diet as instructed by your doctor.   Begin with a light meal and progress to your normal diet. Heavy or fried foods are harder to digest and may make you feel sick to your stomach (nauseated).   Avoid alcoholic beverages for 24 hours or as instructed.  MEDICATIONS  You may resume your normal medications unless your doctor tells you otherwise.  WHAT YOU CAN EXPECT TODAY  Some feelings of bloating in the abdomen.   Passage of more gas than usual.   Spotting of blood in your stool or on the toilet paper.  IF YOU HAD POLYPS REMOVED DURING THE COLONOSCOPY:  No aspirin products for 7 days or as instructed.   No alcohol for 7 days or as instructed.   Eat a soft diet for the next 24 hours.  FINDING OUT THE RESULTS OF YOUR TEST Not all test results are available during your visit. If your test results are not back during the visit, make an appointment  with your caregiver to find out the results. Do not assume everything is normal if you have not heard from your caregiver or the medical facility. It is important for you to follow up on all of your test results.  SEEK IMMEDIATE MEDICAL ATTENTION IF:  You have more than a spotting of blood in your stool.   Your belly is swollen (abdominal distention).   You are nauseated or vomiting.   You have a temperature over 101.   You have abdominal pain or discomfort that is severe or gets worse throughout the day.  EGD Discharge instructions Please read the instructions outlined below and refer to this sheet in the next few weeks. These discharge instructions provide you with general information on caring for yourself after you leave the hospital. Your doctor may also give you specific instructions. While your treatment has been planned according to the most current medical practices available, unavoidable complications occasionally occur. If you have any problems or questions after discharge, please call your doctor. ACTIVITY  You may resume your regular activity but move at a slower pace for the next 24 hours.   Take frequent rest periods for the next 24 hours.   Walking will help expel (get rid of) the air and reduce the bloated feeling in your abdomen.   No driving for 24 hours (because of the anesthesia (medicine) used during the test).   You may shower.   Do not sign any important  legal documents or operate any machinery for 24 hours (because of the anesthesia used during the test).  NUTRITION  Drink plenty of fluids.   You may resume your normal diet.   Begin with a light meal and progress to your normal diet.   Avoid alcoholic beverages for 24 hours or as instructed by your caregiver.  MEDICATIONS  You may resume your normal medications unless your caregiver tells you otherwise.  WHAT YOU CAN EXPECT TODAY  You may experience abdominal discomfort such as a feeling of fullness  or "gas" pains.  FOLLOW-UP  Your doctor will discuss the results of your test with you.  SEEK IMMEDIATE MEDICAL ATTENTION IF ANY OF THE FOLLOWING OCCUR:  Excessive nausea (feeling sick to your stomach) and/or vomiting.   Severe abdominal pain and distention (swelling).   Trouble swallowing.   Temperature over 101 F (37.8 C).   Rectal bleeding or vomiting of blood.    GERD information provided  Continue Protonix 40 mg daily indefinitely  Diverticulosis and colon polyp information provided  Further recommendations to follow pending review of pathology report  No future MRI until clips have passed  At patient request, I called [name], daughter, [phone] -discussed results    Gastroesophageal Reflux Disease, Adult Gastroesophageal reflux (GER) happens when acid from the stomach flows up into the tube that connects the mouth and the stomach (esophagus). Normally, food travels down the esophagus and stays in the stomach to be digested. With GER, food and stomach acid sometimes move back up into the esophagus. You may have a disease called gastroesophageal reflux disease (GERD) if the reflux:  Happens often.  Causes frequent or very bad symptoms.  Causes problems such as damage to the esophagus. When this happens, the esophagus becomes sore and swollen (inflamed). Over time, GERD can make small holes (ulcers) in the lining of the esophagus. What are the causes? This condition is caused by a problem with the muscle between the esophagus and the stomach. When this muscle is weak or not normal, it does not close properly to keep food and acid from coming back up from the stomach. The muscle can be weak because of:  Tobacco use.  Pregnancy.  Having a certain type of hernia (hiatal hernia).  Alcohol use.  Certain foods and drinks, such as coffee, chocolate, onions, and peppermint. What increases the risk? You are more likely to develop this condition if you:  Are  overweight.  Have a disease that affects your connective tissue.  Use NSAID medicines. What are the signs or symptoms? Symptoms of this condition include:  Heartburn.  Difficult or painful swallowing.  The feeling of having a lump in the throat.  A bitter taste in the mouth.  Bad breath.  Having a lot of saliva.  Having an upset or bloated stomach.  Belching.  Chest pain. Different conditions can cause chest pain. Make sure you see your doctor if you have chest pain.  Shortness of breath or noisy breathing (wheezing).  Ongoing (chronic) cough or a cough at night.  Wearing away of the surface of teeth (tooth enamel).  Weight loss. How is this treated? Treatment will depend on how bad your symptoms are. Your doctor may suggest:  Changes to your diet.  Medicine.  Surgery. Follow these instructions at home: Eating and drinking   Follow a diet as told by your doctor. You may need to avoid foods and drinks such as: ? Coffee and tea (with or without caffeine). ? Drinks that  contain alcohol. ? Energy drinks and sports drinks. ? Bubbly (carbonated) drinks or sodas. ? Chocolate and cocoa. ? Peppermint and mint flavorings. ? Garlic and onions. ? Horseradish. ? Spicy and acidic foods. These include peppers, chili powder, curry powder, vinegar, hot sauces, and BBQ sauce. ? Citrus fruit juices and citrus fruits, such as oranges, lemons, and limes. ? Tomato-based foods. These include red sauce, chili, salsa, and pizza with red sauce. ? Fried and fatty foods. These include donuts, french fries, potato chips, and high-fat dressings. ? High-fat meats. These include hot dogs, rib eye steak, sausage, ham, and bacon. ? High-fat dairy items, such as whole milk, butter, and cream cheese.  Eat small meals often. Avoid eating large meals.  Avoid drinking large amounts of liquid with your meals.  Avoid eating meals during the 2-3 hours before bedtime.  Avoid lying down right  after you eat.  Do not exercise right after you eat. Lifestyle   Do not use any products that contain nicotine or tobacco. These include cigarettes, e-cigarettes, and chewing tobacco. If you need help quitting, ask your doctor.  Try to lower your stress. If you need help doing this, ask your doctor.  If you are overweight, lose an amount of weight that is healthy for you. Ask your doctor about a safe weight loss goal. General instructions  Pay attention to any changes in your symptoms.  Take over-the-counter and prescription medicines only as told by your doctor. Do not take aspirin, ibuprofen, or other NSAIDs unless your doctor says it is okay.  Wear loose clothes. Do not wear anything tight around your waist.  Raise (elevate) the head of your bed about 6 inches (15 cm).  Avoid bending over if this makes your symptoms worse.  Keep all follow-up visits as told by your doctor. This is important. Contact a doctor if:  You have new symptoms.  You lose weight and you do not know why.  You have trouble swallowing or it hurts to swallow.  You have wheezing or a cough that keeps happening.  Your symptoms do not get better with treatment.  You have a hoarse voice. Get help right away if:  You have pain in your arms, neck, jaw, teeth, or back.  You feel sweaty, dizzy, or light-headed.  You have chest pain or shortness of breath.  You throw up (vomit) and your throw-up looks like blood or coffee grounds.  You pass out (faint).  Your poop (stool) is bloody or black.  You cannot swallow, drink, or eat. Summary  If a person has gastroesophageal reflux disease (GERD), food and stomach acid move back up into the esophagus and cause symptoms or problems such as damage to the esophagus.  Treatment will depend on how bad your symptoms are.  Follow a diet as told by your doctor.  Take all medicines only as told by your doctor. This information is not intended to replace  advice given to you by your health care provider. Make sure you discuss any questions you have with your health care provider. Document Revised: 02/27/2018 Document Reviewed: 02/27/2018 Elsevier Patient Education  [employer].    Colon Polyps  Polyps are tissue growths inside the body. Polyps can grow in many places, including the large intestine (colon). A polyp may be a round bump or a mushroom-shaped growth. You could have one polyp or several. Most colon polyps are noncancerous (benign). However, some colon polyps can become cancerous over time. Finding and removing the polyps  early can help prevent this. What are the causes? The exact cause of colon polyps is not known. What increases the risk? You are more likely to develop this condition if you:  Have a family history of colon cancer or colon polyps.  Are older than 99 or older than 45 if you are African American.  Have inflammatory bowel disease, such as ulcerative colitis or Crohn's disease.  Have certain hereditary conditions, such as: ? Familial adenomatous polyposis. ? Lynch syndrome. ? Turcot syndrome. ? Peutz-Jeghers syndrome.  Are overweight.  Smoke cigarettes.  Do not get enough exercise.  Drink too much alcohol.  Eat a diet that is high in fat and red meat and low in fiber.  Had childhood cancer that was treated with abdominal radiation. What are the signs or symptoms? Most polyps do not cause symptoms. If you have symptoms, they may include:  Blood coming from your rectum when having a bowel movement.  Blood in your stool. The stool may look dark red or black.  Abdominal pain.  A change in bowel habits, such as constipation or diarrhea. How is this diagnosed? This condition is diagnosed with a colonoscopy. This is a procedure in which a lighted, flexible scope is inserted into the anus and then passed into the colon to examine the area. Polyps are sometimes found when a colonoscopy is done as  part of routine cancer screening tests. How is this treated? Treatment for this condition involves removing any polyps that are found. Most polyps can be removed during a colonoscopy. Those polyps will then be tested for cancer. Additional treatment may be needed depending on the results of testing. Follow these instructions at home: Lifestyle  Maintain a healthy weight, or lose weight if recommended by your health care provider.  Exercise every day or as told by your health care provider.  Do not use any products that contain nicotine or tobacco, such as cigarettes and e-cigarettes. If you need help quitting, ask your health care provider.  If you drink alcohol, limit how much you have: ? 0-1 drink a day for women. ? 0-2 drinks a day for men.  Be aware of how much alcohol is in your drink. In the [place] one drink equals one 12 oz bottle of beer (355 mL), one 5 oz glass of wine (148 mL), or one 1 oz shot of hard liquor (44 mL). Eating and drinking   Eat foods that are high in fiber, such as fruits, vegetables, and whole grains.  Eat foods that are high in calcium and vitamin D, such as milk, cheese, yogurt, eggs, liver, fish, and broccoli.  Limit foods that are high in fat, such as fried foods and desserts.  Limit the amount of red meat and processed meat you eat, such as hot dogs, sausage, bacon, and lunch meats. General instructions  Keep all follow-up visits as told by your health care provider. This is important. ? This includes having regularly scheduled colonoscopies. ? Talk to your health care provider about when you need a colonoscopy. Contact a health care provider if:  You have new or worsening bleeding during a bowel movement.  You have new or increased blood in your stool.  You have a change in bowel habits.  You lose weight for no known reason. Summary  Polyps are tissue growths inside the body. Polyps can grow in many places, including the colon.  Most colon  polyps are noncancerous (benign), but some can become cancerous over time.  This  condition is diagnosed with a colonoscopy.  Treatment for this condition involves removing any polyps that are found. Most polyps can be removed during a colonoscopy. This information is not intended to replace advice given to you by your health care provider. Make sure you discuss any questions you have with your health care provider. Document Revised: 12/06/2017 Document Reviewed: 12/06/2017 Elsevier Patient Education  [place].    Diverticulosis  Diverticulosis is a condition that develops when small pouches (diverticula) form in the wall of the large intestine (colon). The colon is where water is absorbed and stool (feces) is formed. The pouches form when the inside layer of the colon pushes through weak spots in the outer layers of the colon. You may have a few pouches or many of them. The pouches usually do not cause problems unless they become inflamed or infected. When this happens, the condition is called diverticulitis. What are the causes? The cause of this condition is not known. What increases the risk? The following factors may make you more likely to develop this condition:  Being older than age 63. Your risk for this condition increases with age. Diverticulosis is rare among people younger than age 47. By age 17, many people have it.  Eating a low-fiber diet.  Having frequent constipation.  Being overweight.  Not getting enough exercise.  Smoking.  Taking over-the-counter pain medicines, like aspirin and ibuprofen.  Having a family history of diverticulosis. What are the signs or symptoms? In most people, there are no symptoms of this condition. If you do have symptoms, they may include:  Bloating.  Cramps in the abdomen.  Constipation or diarrhea.  Pain in the lower left side of the abdomen. How is this diagnosed? Because diverticulosis usually has no symptoms, it  is most often diagnosed during an exam for other colon problems. The condition may be diagnosed by:  Using a flexible scope to examine the colon (colonoscopy).  Taking an X-ray of the colon after dye has been put into the colon (barium enema).  Having a CT scan. How is this treated? You may not need treatment for this condition. Your health care provider may recommend treatment to prevent problems. You may need treatment if you have symptoms or if you previously had diverticulitis. Treatment may include:  Eating a high-fiber diet.  Taking a fiber supplement.  Taking a live bacteria supplement (probiotic).  Taking medicine to relax your colon. Follow these instructions at home: Medicines  Take over-the-counter and prescription medicines only as told by your health care provider.  If told by your health care provider, take a fiber supplement or probiotic. Constipation prevention Your condition may cause constipation. To prevent or treat constipation, you may need to:  Drink enough fluid to keep your urine pale yellow.  Take over-the-counter or prescription medicines.  Eat foods that are high in fiber, such as beans, whole grains, and fresh fruits and vegetables.  Limit foods that are high in fat and processed sugars, such as fried or sweet foods.  General instructions  Try not to strain when you have a bowel movement.  Keep all follow-up visits as told by your health care provider. This is important. Contact a health care provider if you:  Have pain in your abdomen.  Have bloating.  Have cramps.  Have not had a bowel movement in 3 days. Get help right away if:  Your pain gets worse.  Your bloating becomes very bad.  You have a fever or chills,  and your symptoms suddenly get worse.  You vomit.  You have bowel movements that are bloody or black.  You have bleeding from your rectum. Summary  Diverticulosis is a condition that develops when small pouches  (diverticula) form in the wall of the large intestine (colon).  You may have a few pouches or many of them.  This condition is most often diagnosed during an exam for other colon problems.  Treatment may include increasing the fiber in your diet, taking supplements, or taking medicines. This information is not intended to replace advice given to you by your health care provider. Make sure you discuss any questions you have with your health care provider. Document Revised: 03/20/2019 Document Reviewed: 03/20/2019 Elsevier Patient Education  [place] These instructions provide you with information about caring for yourself after your procedure. Your health care provider may also give you more specific instructions. Your treatment has been planned according to current medical practices, but problems sometimes occur. Call your health care provider if you have any problems or questions after your procedure. What can I expect after the procedure? After your procedure, you may:  Feel sleepy for several hours.  Feel clumsy and have poor balance for several hours.  Feel forgetful about what happened after the procedure.  Have poor judgment for several hours.  Feel nauseous or vomit.  Have a sore throat if you had a breathing tube during the procedure. Follow these instructions at home: For at least 24 hours after the procedure:      Have a responsible adult stay with you. It is important to have someone help care for you until you are awake and alert.  Rest as needed.  Do not: ? Participate in activities in which you could fall or become injured. ? Drive. ? Use heavy machinery. ? Drink alcohol. ? Take sleeping pills or medicines that cause drowsiness. ? Make important decisions or sign legal documents. ? Take care of children on your own. Eating and drinking  Follow the diet that is recommended by your health care  provider.  If you vomit, drink water, juice, or soup when you can drink without vomiting.  Make sure you have little or no nausea before eating solid foods. General instructions  Take over-the-counter and prescription medicines only as told by your health care provider.  If you have sleep apnea, surgery and certain medicines can increase your risk for breathing problems. Follow instructions from your health care provider about wearing your sleep device: ? Anytime you are sleeping, including during daytime naps. ? While taking prescription pain medicines, sleeping medicines, or medicines that make you drowsy.  If you smoke, do not smoke without supervision.  Keep all follow-up visits as told by your health care provider. This is important. Contact a health care provider if:  You keep feeling nauseous or you keep vomiting.  You feel light-headed.  You develop a rash.  You have a fever. Get help right away if:  You have trouble breathing. Summary  For several hours after your procedure, you may feel sleepy and have poor judgment.  Have a responsible adult stay with you for at least 24 hours or until you are awake and alert. This information is not intended to replace advice given to you by your health care provider. Make sure you discuss any questions you have with your health care provider. Document Revised: 11/19/2017 Document Reviewed: 12/12/2015 Elsevier Patient Education  2020 Elsevier  Inc.  

## 2019-10-09 NOTE — Interval H&P Note (Signed)
History and Physical Interval Note:  10/09/2019 2:05 PM  [name]  has presented today for surgery, with the diagnosis of dysphagia, iron deficiency anemia, homocult positive stool.  The various methods of treatment have been discussed with the patient and family. After consideration of risks, benefits and other options for treatment, the patient has consented to  Procedure(s) with comments: COLONOSCOPY WITH PROPOFOL (N/A) - 1:15pm ESOPHAGOGASTRODUODENOSCOPY (EGD) WITH PROPOFOL (N/A) MALONEY DILATION (N/A) as a surgical intervention.  The patient's history has been reviewed, patient examined, no change in status, stable for surgery.  I have reviewed the patient's chart and labs.  Questions were answered to the patient's satisfaction.     [name]

## 2019-10-09 NOTE — Transfer of Care (Signed)
Immediate Anesthesia Transfer of Care Note  Patient: [name]  Procedure(s) Performed: COLONOSCOPY WITH PROPOFOL (N/A ) ESOPHAGOGASTRODUODENOSCOPY (EGD) WITH PROPOFOL (N/A ) MALONEY DILATION (N/A ) POLYPECTOMY HEMOSTASIS CLIP PLACEMENT  Patient Location: PACU  Anesthesia Type:MAC  Level of Consciousness: awake, alert , oriented and patient cooperative  Airway & Oxygen Therapy: Patient Spontanous Breathing  Post-op Assessment: Report given to RN, Post -op Vital signs reviewed and stable and Patient moving all extremities X 4  Post vital signs: Reviewed and stable  Last Vitals:  Vitals Value Taken Time  BP    Temp    Pulse 44 10/09/19 1502  Resp    SpO2 82 % 10/09/19 1502  Vitals shown include unvalidated device data.  Last Pain:  Vitals:   10/09/19 1417  TempSrc:   PainSc: 4       Patients Stated Pain Goal: 6 (27/78/24 2353)  Complications: No apparent anesthesia complications

## 2019-10-09 NOTE — Anesthesia Preprocedure Evaluation (Signed)
Anesthesia Evaluation  Patient identified by MRN, date of birth, ID band Patient awake    Reviewed: Allergy & Precautions, NPO status , Patient's Chart, lab work & pertinent test results  History of Anesthesia Complications (+) PONV and history of anesthetic complications  Airway Mallampati: I  TM Distance: >3 FB Neck ROM: Full    Dental no notable dental hx. (+) Edentulous Upper, Edentulous Lower   Pulmonary neg pulmonary ROS,    Pulmonary exam normal breath sounds clear to auscultation       Cardiovascular Exercise Tolerance: Good hypertension, Pt. on medications negative cardio ROS Normal cardiovascular examI Rhythm:Regular Rate:Normal  Denies known cardiac issues denies recent CP or SOB Appears frail   Neuro/Psych  Headaches, Dementia Daughter present for history/consent negative psych ROS   GI/Hepatic Neg liver ROS, hiatal hernia, GERD  Medicated and Controlled,  Endo/Other  Hypothyroidism   Renal/GU Renal InsufficiencyRenal disease  negative genitourinary   Musculoskeletal  (+) Arthritis , Osteoarthritis,    Abdominal   Peds negative pediatric ROS (+)  Hematology negative hematology ROS (+) anemia ,   Anesthesia Other Findings   Reproductive/Obstetrics negative OB ROS                             Anesthesia Physical Anesthesia Plan  ASA: III  Anesthesia Plan: General   Post-op Pain Management:    Induction: Intravenous  PONV Risk Score and Plan: 4 or greater and Propofol infusion, TIVA, Treatment may vary due to age or medical condition and Ondansetron  Airway Management Planned: Simple Face Mask and Nasal Cannula  Additional Equipment:   Intra-op Plan:   Post-operative Plan:   Informed Consent: I have reviewed the patients History and Physical, chart, labs and discussed the procedure including the risks, benefits and alternatives for the proposed anesthesia with the  patient or authorized representative who has indicated his/her understanding and acceptance.     Dental advisory given  Plan Discussed with: CRNA  Anesthesia Plan Comments: (Plan Full PPE use  Plan GA with GETA as needed d/w pt -WTP with same after Q&A  Daughter present for consent /Q&A)        Anesthesia Quick Evaluation

## 2019-10-09 NOTE — Op Note (Signed)
[facility] Patient Name: [name] Procedure Date: 10/09/2019 1:59 PM MRN: [id number] Date of Birth: Jan 24, 1935 Attending MD: Norvel Richards , MD CSN: [id number] Age: 85 Admit Type: Outpatient Procedure:                Upper GI endoscopy Indications:              Dysphagia Providers:                Norvel Richards, MD, Jeanann Lewandowsky. Sharon Seller, RN,                            Aram Candela Referring MD:              Medicines:                Propofol per Anesthesia Complications:            No immediate complications. Estimated Blood Loss:     Estimated blood loss was minimal. Procedure:                Pre-Anesthesia Assessment:                           - Prior to the procedure, a History and Physical                            was performed, and patient medications and                            allergies were reviewed. The patient's tolerance of                            previous anesthesia was also reviewed. The risks                            and benefits of the procedure and the sedation                            options and risks were discussed with the patient.                            All questions were answered, and informed consent                            was obtained. Prior Anticoagulants: The patient has                            taken no previous anticoagulant or antiplatelet                            agents. ASA Grade Assessment: III - A patient with                            severe systemic disease. After reviewing the risks  and benefits, the patient was deemed in                            satisfactory condition to undergo the procedure.                           After obtaining informed consent, the endoscope was                            passed under direct vision. Throughout the                            procedure, the patient's blood pressure, pulse, and                            oxygen saturations were  monitored continuously. The                            [id number]-[id number]) scope was introduced through the                            mouth, and advanced to the second part of duodenum.                            The upper GI endoscopy was accomplished without                            difficulty. The patient tolerated the procedure                            well. Scope In: 2:20:50 PM Scope Out: 2:26:56 PM Total Procedure Duration: 0 hours 6 minutes 6 seconds  Findings:      A mild Schatzki ring was found at the gastroesophageal junction.      A medium-sized hiatal hernia was present. No residual evidence of prior       Nissen fundoplication whatsoever      The exam was otherwise without abnormality.      The duodenal bulb and second portion of the duodenum were normal. The       scope was withdrawn. Dilation was performed with a Maloney dilator with       moderate resistance at 54 Fr. The dilation site was examined following       endoscope reinsertion and showed moderate mucosal disruption. Estimated       blood loss was minimal. Impression:               - Mild Schatzki ring. Dilated.                           - Medium-sized hiatal hernia. Completely slipped                            Nissen fundoplication                           - The examination was otherwise normal.                           -  Normal duodenal bulb and second portion of the                            duodenum.                           - No specimens collected. Moderate Sedation:      Moderate (conscious) sedation was personally administered by an       anesthesia professional. The following parameters were monitored: oxygen       saturation, heart rate, blood pressure, respiratory rate, EKG, adequacy       of pulmonary ventilation, and response to care. Recommendation:           - Patient has a contact number available for                            emergencies. The signs and symptoms of potential                             delayed complications were discussed with the                            patient. Return to normal activities tomorrow.                            Written discharge instructions were provided to the                            patient.                           - Advance diet as tolerated.                           - Continue present medications. Continue Protonix                            40 mg daily. See colonoscopy report. Procedure Code(s):        --- Professional ---                           [id number], Esophagogastroduodenoscopy, flexible,                            transoral; diagnostic, including collection of                            specimen(s) by brushing or washing, when performed                            (separate procedure)                           43450, Dilation of esophagus, by unguided sound or                            bougie, single  or multiple passes Diagnosis Code(s):        --- Professional ---                           K22.2, Esophageal obstruction                           K44.9, Diaphragmatic hernia without obstruction or                            gangrene                           R13.10, Dysphagia, unspecified CPT copyright 2019 American Medical Association. All rights reserved. The codes documented in this report are preliminary and upon coder review may  be revised to meet current compliance requirements. Cristopher Estimable. Cirilo Canner, MD Norvel Richards, MD 10/09/2019 2:58:36 PM This report has been signed electronically. Number of Addenda: 0

## 2019-10-13 ENCOUNTER — Encounter: Payer: Self-pay | Admitting: Internal Medicine

## 2019-10-13 LAB — SURGICAL PATHOLOGY

## 2019-10-17 DIAGNOSIS — Z23 Encounter for immunization: Secondary | ICD-10-CM | POA: Diagnosis not present

## 2019-10-31 DIAGNOSIS — I10 Essential (primary) hypertension: Secondary | ICD-10-CM | POA: Diagnosis not present

## 2019-10-31 DIAGNOSIS — E7849 Other hyperlipidemia: Secondary | ICD-10-CM | POA: Diagnosis not present

## 2019-11-17 DIAGNOSIS — Z23 Encounter for immunization: Secondary | ICD-10-CM | POA: Diagnosis not present

## 2019-12-10 DIAGNOSIS — K219 Gastro-esophageal reflux disease without esophagitis: Secondary | ICD-10-CM | POA: Diagnosis not present

## 2019-12-10 DIAGNOSIS — G301 Alzheimer's disease with late onset: Secondary | ICD-10-CM | POA: Diagnosis not present

## 2019-12-10 DIAGNOSIS — N183 Chronic kidney disease, stage 3 unspecified: Secondary | ICD-10-CM | POA: Diagnosis not present

## 2019-12-10 DIAGNOSIS — I10 Essential (primary) hypertension: Secondary | ICD-10-CM | POA: Diagnosis not present

## 2019-12-10 DIAGNOSIS — E782 Mixed hyperlipidemia: Secondary | ICD-10-CM | POA: Diagnosis not present

## 2019-12-17 ENCOUNTER — Ambulatory Visit: Payer: Medicare Other | Admitting: Gastroenterology

## 2019-12-17 DIAGNOSIS — G301 Alzheimer's disease with late onset: Secondary | ICD-10-CM | POA: Diagnosis not present

## 2019-12-17 DIAGNOSIS — E782 Mixed hyperlipidemia: Secondary | ICD-10-CM | POA: Diagnosis not present

## 2019-12-17 DIAGNOSIS — E039 Hypothyroidism, unspecified: Secondary | ICD-10-CM | POA: Diagnosis not present

## 2019-12-17 DIAGNOSIS — K432 Incisional hernia without obstruction or gangrene: Secondary | ICD-10-CM | POA: Diagnosis not present

## 2019-12-17 DIAGNOSIS — K589 Irritable bowel syndrome without diarrhea: Secondary | ICD-10-CM | POA: Diagnosis not present

## 2019-12-17 DIAGNOSIS — Z6826 Body mass index (BMI) 26.0-26.9, adult: Secondary | ICD-10-CM | POA: Diagnosis not present

## 2019-12-17 DIAGNOSIS — M810 Age-related osteoporosis without current pathological fracture: Secondary | ICD-10-CM | POA: Diagnosis not present

## 2019-12-17 DIAGNOSIS — I10 Essential (primary) hypertension: Secondary | ICD-10-CM | POA: Diagnosis not present

## 2019-12-23 DIAGNOSIS — H401134 Primary open-angle glaucoma, bilateral, indeterminate stage: Secondary | ICD-10-CM | POA: Diagnosis not present

## 2019-12-26 ENCOUNTER — Other Ambulatory Visit: Payer: Self-pay

## 2019-12-26 ENCOUNTER — Telehealth: Payer: Self-pay | Admitting: Gastroenterology

## 2019-12-26 ENCOUNTER — Ambulatory Visit (INDEPENDENT_AMBULATORY_CARE_PROVIDER_SITE_OTHER): Payer: Medicare Other | Admitting: Gastroenterology

## 2019-12-26 ENCOUNTER — Encounter: Payer: Self-pay | Admitting: Gastroenterology

## 2019-12-26 VITALS — BP 124/80 | HR 78 | Temp 96.6°F | Ht 66.0 in | Wt 170.4 lb

## 2019-12-26 DIAGNOSIS — D509 Iron deficiency anemia, unspecified: Secondary | ICD-10-CM | POA: Diagnosis not present

## 2019-12-26 DIAGNOSIS — K219 Gastro-esophageal reflux disease without esophagitis: Secondary | ICD-10-CM

## 2019-12-26 NOTE — Patient Instructions (Signed)
I am glad you are doing well!  Continue Protonix each morning, making sure to take it on an empty stomach 30 minutes before breakfast.   We will see you in 8 months!   I enjoyed seeing you again today! As you know, I value our relationship and want to provide genuine, compassionate, and quality care. I welcome your feedback. If you receive a survey regarding your visit,  I greatly appreciate you taking time to fill this out. See you next time!  Annitta Needs, PhD, ANP-BC [facility]

## 2019-12-26 NOTE — Telephone Encounter (Signed)
[name], can we request most recent CBC and iron studies from PCP?

## 2019-12-26 NOTE — Progress Notes (Addendum)
Referring Provider: Caryl Bis, MD Primary Care Physician:  Caryl Bis, MD Primary GI: [facility]   Chief Complaint  Patient presents with  . Dysphagia    doing ok  . Gastroesophageal Reflux    HPI:   [name] is an 85 y.o. female presenting today with a history of IDA, GERD, dysphagia, recently undergoing colonoscopy and EGD in interim from last appointment. Pertinent GI history significant for sigmoid resection for diverticulitis with abscess and 2018 by Dr. [name].  Subsequently, she had a ventral hernia repair. Weight loss has improved and now stabilized.   Colonoscopy Feb 2021: one 15 mm polyp in ascending colon, one 5 mm polyp in ascending colon, pancolonic diverticulosis, surgical anastomosis consistent with prior history of sigmoid resection. Tubular adenomas.   EGD: mild Schatzki ring s/p dilation, medium-sized hiatal hernia, completely slipped Nissen otherwise normal.    IDA resolved per patient and daughter. No abdominal pain. No constipation. Not taking Protonix on an empty stomach. Pepcid at night. GERD symptoms have flared since last seen, with addition of Pepcid in evening. GERD symptoms improved with this. No overt GI bleeding. Appetite is fair.    Past Medical History:  Diagnosis Date  . Breast cancer ([place]) 2003  . Dementia ([place])   . Diverticulitis    History  . Dyslexia   . GERD (gastroesophageal reflux disease)    takes Nexium daily  . H/O hiatal hernia   . Headache(784.0)    all the time  . Heart murmur   . History of esophageal spasm   . History of IBS    no problem in past yr  . History of kidney stones   . History of shingles   . HTN (hypertension)    hx of  . Hyperlipidemia    takes Pravastatin daily  . Hypokalemia   . Hypothyroidism    takes Synthroid daily  . Impaired hearing   . Incisional hernia    Abdomen  . Incisional hernia, without obstruction or gangrene 04/22/2018  . Joint pain   . Joint swelling   .  Neck pain    pinched   . Osteoarthritis   . Osteoporosis 09/08/2011  . PONV (postoperative nausea and vomiting)   . Syncope, non cardiac   . Weakness    numbness left arm    Past Surgical History:  Procedure Laterality Date  . APPENDECTOMY    . Arm Fx     left  . BREAST LUMPECTOMY     Left  . CARDIAC CATHETERIZATION     x 3-last one in the 90's per pt  . CATARACT EXTRACTION    . CATARACT EXTRACTION W/PHACO  10/02/2011   Procedure: CATARACT EXTRACTION PHACO AND INTRAOCULAR LENS PLACEMENT (IOC);  Surgeon: [name], MD;  Location: [facility];  Service: Ophthalmology;  Laterality: Right;  CDE:17.01  . CHOLECYSTECTOMY  1997   Status post -stones  . COLONOSCOPY  2012   Dr. Gala Romney: pancolonic diverticulosis, tubular adenoma removed from cecum. next TCS 2019  . COLONOSCOPY N/A 11/17/2016   Procedure: COLONOSCOPY;  Surgeon: [name], MD;  [facility];  Service: Endoscopy;  Laterality: N/A;  1200 - moved to 3/16 @ 12:00  . COLONOSCOPY WITH PROPOFOL N/A 10/09/2019   Procedure: COLONOSCOPY WITH PROPOFOL;  Surgeon: [name], MD;  [facility];  Service: Endoscopy;  Laterality: N/A;  1:15pm  . EGD with dilitation     2004/2005 schatki's  ring s/p dilation, wrap no longer intact on 2005 egd.  . ESOPHAGOGASTRODUODENOSCOPY N/A 11/17/2016   Procedure: ESOPHAGOGASTRODUODENOSCOPY (EGD);  Surgeon: [name], MD;  [facility];  Service: Endoscopy;  Laterality: N/A;  . ESOPHAGOGASTRODUODENOSCOPY (EGD) WITH PROPOFOL N/A 10/09/2019   Procedure: ESOPHAGOGASTRODUODENOSCOPY (EGD) WITH PROPOFOL;  Surgeon: [name], MD;  [facility];  Service: Endoscopy;  Laterality: N/A;  . HEMOSTASIS CLIP PLACEMENT  10/09/2019   Procedure: HEMOSTASIS CLIP PLACEMENT;  Surgeon: [name], MD;  [facility];  Service: Endoscopy;;  . INCISIONAL HERNIA REPAIR N/A 04/22/2018   Procedure: [place];  Surgeon: [name], MD;  Location: [place];  Service: General;  Laterality: N/A;  . INSERTION OF MESH N/A 04/22/2018   Procedure: INSERTION OF MESH;  Surgeon: [name], MD;  Location: [place];  Service: General;  Laterality: N/A;  . kidney stent  06/04/11   removed after about 3 weeks.  Marland Kitchen [place]  . KNEE SURGERY     bilateral  . LAPAROSCOPIC NISSEN FUNDOPLICATION  0000000  . LAPAROSCOPIC PARTIAL COLECTOMY N/A 03/09/2017   Procedure: LAPAROSCOPY, TAKEDOWN SPLENIC FLEXURE, LEFT COLECTOMY, PROCTOSCOPY;  Surgeon: [name], MD;  Location: [facility];  Service: General;  Laterality: N/A;  . LITHOTRIPSY  11/12  . MALONEY DILATION N/A 10/09/2019   Procedure: Venia Minks DILATION;  Surgeon: [name], MD;  [facility];  Service: Endoscopy;  Laterality: N/A;  . MANDIBLE RECONSTRUCTION    . NECK SURGERY  2000   [facility]  . PARATHYROIDECTOMY  12/03/2013   DR TEOH  . POLYPECTOMY  10/09/2019   Procedure: POLYPECTOMY;  Surgeon: [name], MD;  [facility];  Service: Endoscopy;;  colon  . PROCTOSCOPY  03/09/2017   Procedure: PROCTOSCOPY;  Surgeon: [name], MD;  Location: [facility];  Service: General;;  . SHOULDER SURGERY Left   . THYROIDECTOMY     Hypothyroidism status post  . THYROIDECTOMY N/A 12/03/2013   Procedure: PARATHYROIDECTOMY;  Surgeon: [name], MD;  Location: [place];  Service: ENT;  Laterality: N/A;  . TONSILLECTOMY    . TUBAL LIGATION    . WRIST SURGERY Left 2009    Current Outpatient Medications  Medication Sig Dispense Refill  . acetaminophen (TYLENOL) 650 MG CR tablet Take 650-1,300 mg by mouth every 8 (eight) hours as needed for pain.    . Calcium Carb-Cholecalciferol (CALCIUM 600 + D PO) Take 2 tablets by mouth daily.    . famotidine (PEPCID) 20 MG tablet Take 20 mg by mouth daily.    Marland Kitchen latanoprost (XALATAN) 0.005 % ophthalmic solution Place 1 drop into both eyes at bedtime.    Marland Kitchen levothyroxine (SYNTHROID, LEVOTHROID) 88 MCG tablet Take 88 mcg  by mouth daily before breakfast.    . lisinopril (ZESTRIL) 10 MG tablet Take 10 mg by mouth daily.     . Menthol, Topical Analgesic, (GNP COLD/HOT PAIN RELIEF PATCH EX) Apply 1 patch topically daily as needed (pain).    . Multiple Vitamins-Minerals (CENTRUM SILVER ADULT 50+ PO) Take 2 capsules by mouth daily.     . Multiple Vitamins-Minerals (PRESERVISION AREDS PO) Take 1 tablet by mouth daily.     . pantoprazole (PROTONIX) 40 MG tablet Take 40 mg by mouth daily.     . pravastatin (PRAVACHOL) 20 MG tablet Take 20 mg by mouth every morning.     . risperiDONE (RISPERDAL) 0.5 MG tablet Take  0.5 mg by mouth at bedtime.    . sertraline (ZOLOFT) 25 MG tablet Take 25 mg by mouth daily.    . traMADol (ULTRAM) 50 MG tablet Take 50 mg by mouth daily as needed for severe pain.      No current facility-administered medications for this visit.   Facility-Administered Medications Ordered in Other Visits  Medication Dose Route Frequency Provider Last Rate Last Admin  . 0.9 %  sodium chloride infusion   Intravenous Continuous Baird Cancer, PA-C 20 mL/hr at 11/15/16 1402 New Bag at 11/15/16 1402  . 0.9 %  sodium chloride infusion   Intravenous Once [name], MD        Allergies as of 12/26/2019 - Review Complete 12/26/2019  Allergen Reaction Noted  . Cortisone Other (See Comments) 09/26/2019  . Metronidazole Other (See Comments) 09/12/2011  . Oxycodone Other (See Comments) 11/07/2010  . Ciprofloxacin Rash 11/07/2010  . Latex Itching and Rash 11/21/2013    Family History  Problem Relation Age of Onset  . Macular degeneration Sister   . Lung cancer Sister   . Pancreatic cancer Mother   . Heart attack Father   . Anesthesia problems Neg Hx   . Hypotension Neg Hx   . Pseudochol deficiency Neg Hx   . Malignant hyperthermia Neg Hx   . Colon cancer Neg Hx     Social History   Socioeconomic History  . Marital status: Widowed    Spouse name: Not on file  . Number of children: Not on  file  . Years of education: Not on file  . Highest education level: Not on file  Occupational History  . Not on file  Tobacco Use  . Smoking status: Never Smoker  . Smokeless tobacco: Never Used  Substance and Sexual Activity  . Alcohol use: No  . Drug use: No  . Sexual activity: Never  Other Topics Concern  . Not on file  Social History Narrative  . Not on file   Social Determinants of Health   Financial Resource Strain:   . Difficulty of Paying Living Expenses:   Food Insecurity:   . Worried About Charity fundraiser in the Last Year:   . Arboriculturist in the Last Year:   Transportation [name]:   . Film/video editor (Medical):   Marland Kitchen Lack of Transportation (Non-Medical):   Physical Activity:   . Days of Exercise per Week:   . Minutes of Exercise per Session:   Stress:   . Feeling of Stress :   Social Connections:   . Frequency of Communication with Friends and Family:   . Frequency of Social Gatherings with Friends and Family:   . Attends Religious Services:   . Active Member of Clubs or Organizations:   . Attends Archivist Meetings:   Marland Kitchen Marital Status:     Review of Systems: Gen: Denies fever, chills, anorexia. Denies fatigue, weakness, weight loss.  CV: Denies chest pain, palpitations, syncope, peripheral edema, and claudication. Resp: Denies dyspnea at rest, cough, wheezing, coughing up blood, and pleurisy. GI: see HPI Derm: Denies rash, itching, dry skin Psych: Denies depression, anxiety, memory loss, confusion. No homicidal or suicidal ideation.  Heme: Denies bruising, bleeding, and enlarged lymph nodes.  Physical Exam: BP 124/80   Pulse 78   Temp (!) 96.6 F (35.9 C) (Temporal)   Ht 5\' 6"  (1.676 m)   Wt 170 lb 6.4 oz (77.3 kg)   BMI 27.50 kg/m  General:  Alert and oriented. No distress noted. Pleasant and cooperative.  Head:  Normocephalic and atraumatic. Eyes:  Conjuctiva clear without scleral icterus. Mouth:  Mask in place   Abdomen:  +BS, soft, non-tender and non-distended. No rebound or guarding. Small umbilical hernia likely Msk:  With kyphosis  Extremities:  Without edema. Neurologic:  Alert and  oriented x4 Psych:  Alert and cooperative. Normal mood and affect.  ASSESSMENT: KARSYNN PINKHAM is an 85 y.o. female presenting with history of IDA, GERD, and dysphagia, recently undergoing EGD/dilation and colonoscopy with several polyps, one >1cm. Found to be adenomas.   Flare of GERD in interim from last appointment responded nicely to addition of Pepcid in evening. We discussed taking Protonix on an empty stomach each morning for best efficacy.   Dysphagia has resolved.  Per patient and daughter, IDA has also resolved. No longer on oral iron. Followed closely by PCP.   PLAN:   Continue Protonix daily, Pepcid in evenings  Will request outside labs  Return in 6-8 months   Belinda Needs, PhD, ANP-BC [facility]   Addendum: no outside CBC available. Only HFP from July 2021. Will be scanned in.

## 2019-12-29 NOTE — Telephone Encounter (Signed)
Requested form PCP, they will send

## 2020-03-26 DIAGNOSIS — K219 Gastro-esophageal reflux disease without esophagitis: Secondary | ICD-10-CM | POA: Diagnosis not present

## 2020-03-26 DIAGNOSIS — N183 Chronic kidney disease, stage 3 unspecified: Secondary | ICD-10-CM | POA: Diagnosis not present

## 2020-03-26 DIAGNOSIS — E039 Hypothyroidism, unspecified: Secondary | ICD-10-CM | POA: Diagnosis not present

## 2020-03-26 DIAGNOSIS — G301 Alzheimer's disease with late onset: Secondary | ICD-10-CM | POA: Diagnosis not present

## 2020-03-26 DIAGNOSIS — E782 Mixed hyperlipidemia: Secondary | ICD-10-CM | POA: Diagnosis not present

## 2020-03-26 DIAGNOSIS — I10 Essential (primary) hypertension: Secondary | ICD-10-CM | POA: Diagnosis not present

## 2020-03-31 DIAGNOSIS — K589 Irritable bowel syndrome without diarrhea: Secondary | ICD-10-CM | POA: Diagnosis not present

## 2020-03-31 DIAGNOSIS — Z6828 Body mass index (BMI) 28.0-28.9, adult: Secondary | ICD-10-CM | POA: Diagnosis not present

## 2020-03-31 DIAGNOSIS — E782 Mixed hyperlipidemia: Secondary | ICD-10-CM | POA: Diagnosis not present

## 2020-03-31 DIAGNOSIS — K432 Incisional hernia without obstruction or gangrene: Secondary | ICD-10-CM | POA: Diagnosis not present

## 2020-03-31 DIAGNOSIS — Z23 Encounter for immunization: Secondary | ICD-10-CM | POA: Diagnosis not present

## 2020-03-31 DIAGNOSIS — F331 Major depressive disorder, recurrent, moderate: Secondary | ICD-10-CM | POA: Diagnosis not present

## 2020-03-31 DIAGNOSIS — I10 Essential (primary) hypertension: Secondary | ICD-10-CM | POA: Diagnosis not present

## 2020-03-31 DIAGNOSIS — G301 Alzheimer's disease with late onset: Secondary | ICD-10-CM | POA: Diagnosis not present

## 2020-04-26 DIAGNOSIS — H401132 Primary open-angle glaucoma, bilateral, moderate stage: Secondary | ICD-10-CM | POA: Diagnosis not present

## 2020-04-26 DIAGNOSIS — H401123 Primary open-angle glaucoma, left eye, severe stage: Secondary | ICD-10-CM | POA: Diagnosis not present

## 2020-04-28 DIAGNOSIS — H903 Sensorineural hearing loss, bilateral: Secondary | ICD-10-CM | POA: Diagnosis not present

## 2020-04-28 DIAGNOSIS — H838X3 Other specified diseases of inner ear, bilateral: Secondary | ICD-10-CM | POA: Diagnosis not present

## 2020-04-29 ENCOUNTER — Other Ambulatory Visit: Payer: Medicare Other

## 2020-04-29 ENCOUNTER — Other Ambulatory Visit: Payer: Self-pay

## 2020-04-29 DIAGNOSIS — Z20822 Contact with and (suspected) exposure to covid-19: Secondary | ICD-10-CM

## 2020-05-01 LAB — NOVEL CORONAVIRUS, NAA: SARS-CoV-2, NAA: NOT DETECTED

## 2020-05-01 LAB — SARS-COV-2, NAA 2 DAY TAT

## 2020-05-05 ENCOUNTER — Telehealth: Payer: Self-pay | Admitting: Gastroenterology

## 2020-05-05 NOTE — Telephone Encounter (Signed)
[name], can you have recent labs faxed to [place] from PCP for our records? We had requested when she was last here, but I never got them.

## 2020-05-06 NOTE — Telephone Encounter (Signed)
requested

## 2020-06-27 IMAGING — DX DG NECK SOFT TISSUE
2 series · 2 of 2 positions shown · non-contrast
Comparison: None.

CLINICAL DATA: Sensation of something being stuck in the throat.
Recent ingestion of chicken bone.

EXAM:
NECK SOFT TISSUES - 1+ VIEW

[neck lat]
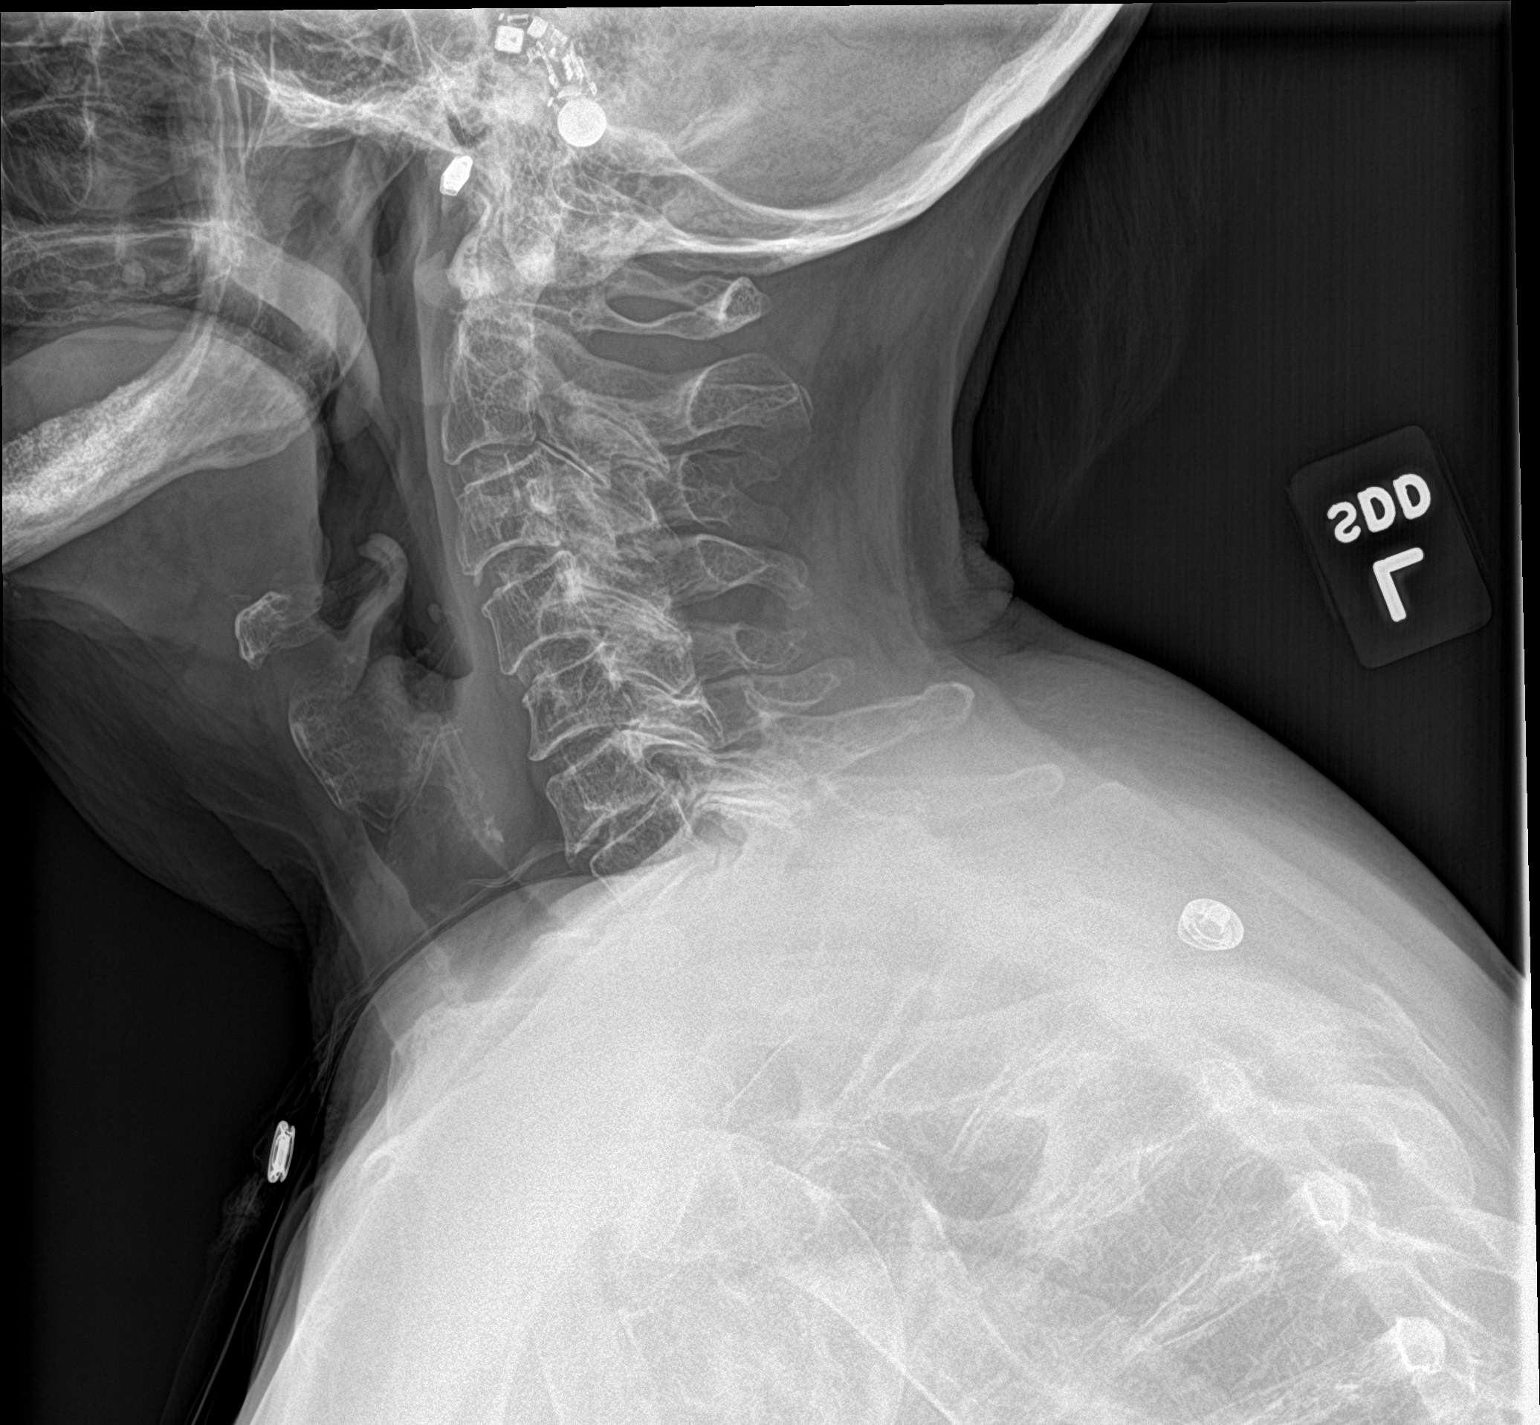

[neck ap]
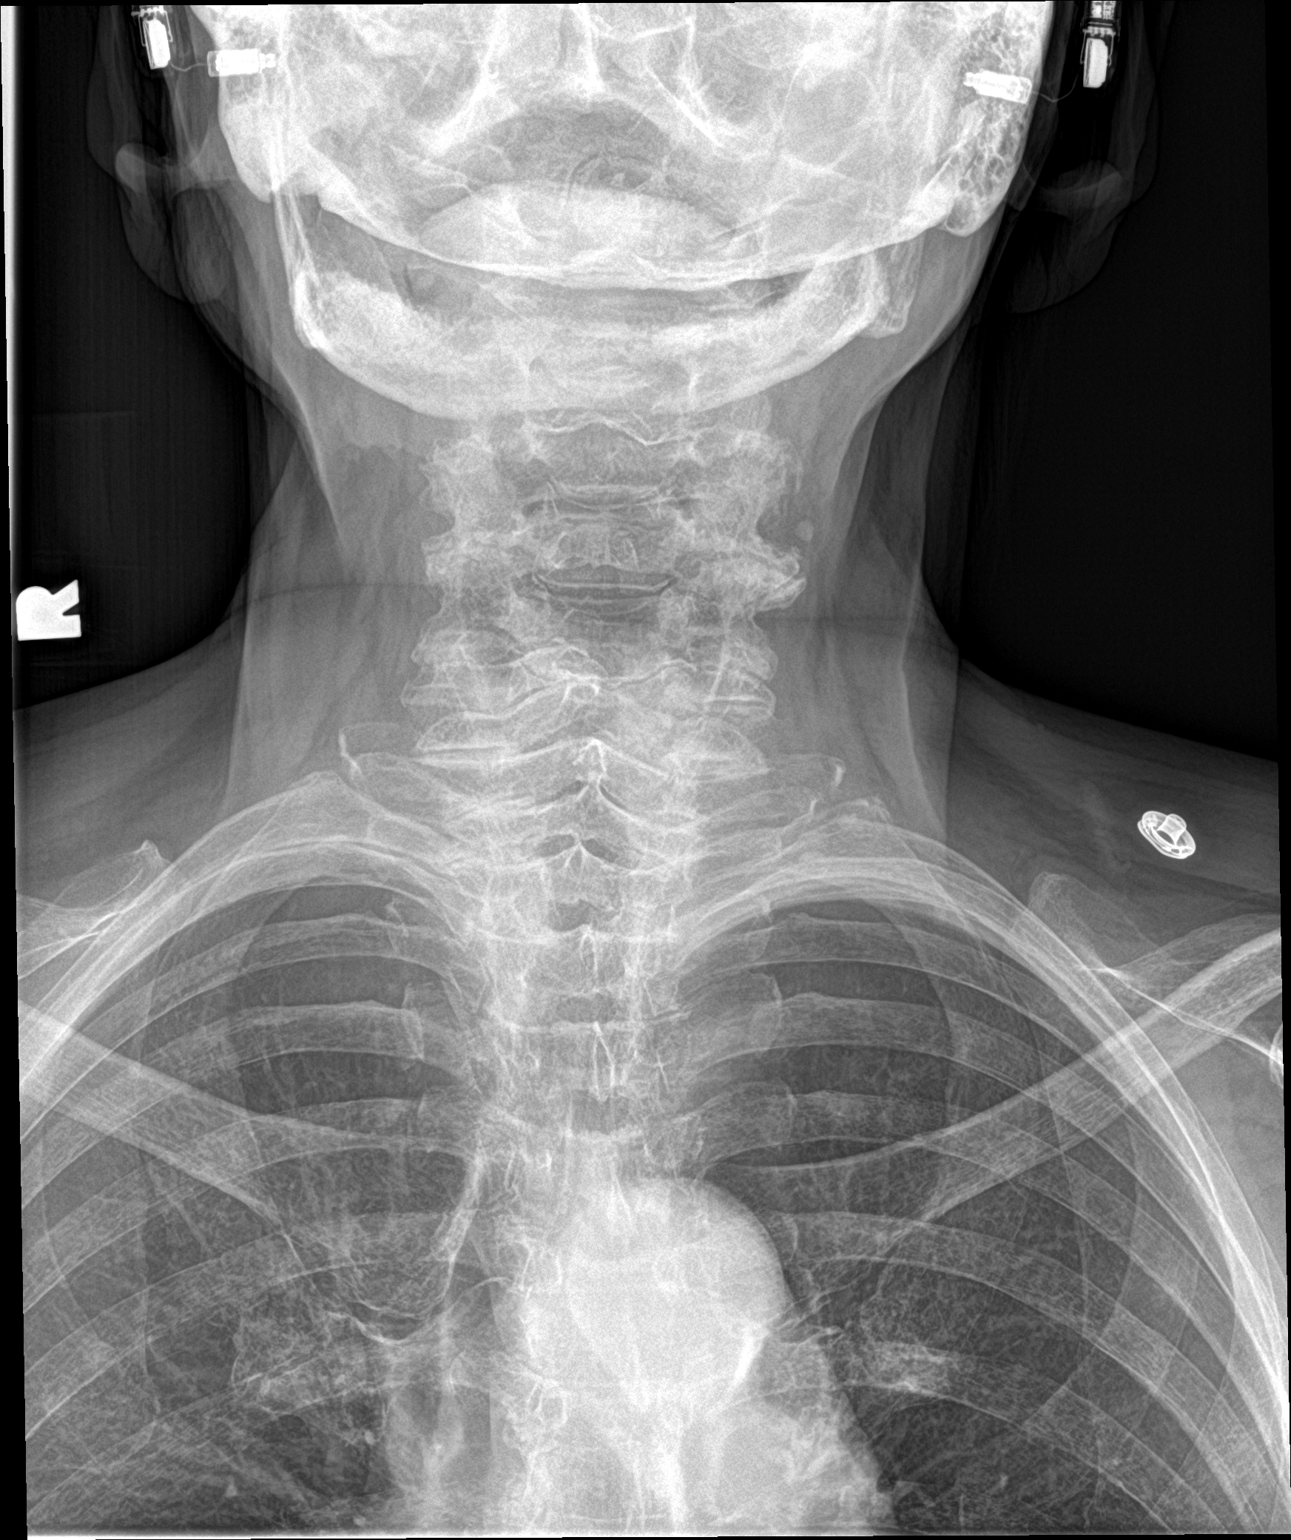

[2 of 2 positions shown; findings below may reference images not displayed]

FINDINGS: There is no evidence of retropharyngeal soft tissue swelling or
epiglottic enlargement. The cervical airway is unremarkable and no
radio-opaque foreign body identified.
IMPRESSION: No radiopaque foreign body along the course of the cervical
aerodigestive tract.

## 2020-07-28 DIAGNOSIS — Z23 Encounter for immunization: Secondary | ICD-10-CM | POA: Diagnosis not present

## 2020-08-05 DIAGNOSIS — Z20828 Contact with and (suspected) exposure to other viral communicable diseases: Secondary | ICD-10-CM | POA: Diagnosis not present

## 2020-08-05 DIAGNOSIS — B349 Viral infection, unspecified: Secondary | ICD-10-CM | POA: Diagnosis not present

## 2020-08-11 ENCOUNTER — Ambulatory Visit: Payer: Medicare Other | Admitting: Gastroenterology

## 2020-08-20 DIAGNOSIS — N183 Chronic kidney disease, stage 3 unspecified: Secondary | ICD-10-CM | POA: Diagnosis not present

## 2020-08-20 DIAGNOSIS — E039 Hypothyroidism, unspecified: Secondary | ICD-10-CM | POA: Diagnosis not present

## 2020-08-20 DIAGNOSIS — E782 Mixed hyperlipidemia: Secondary | ICD-10-CM | POA: Diagnosis not present

## 2020-08-24 DIAGNOSIS — Z1389 Encounter for screening for other disorder: Secondary | ICD-10-CM | POA: Diagnosis not present

## 2020-08-24 DIAGNOSIS — F331 Major depressive disorder, recurrent, moderate: Secondary | ICD-10-CM | POA: Diagnosis not present

## 2020-08-24 DIAGNOSIS — I10 Essential (primary) hypertension: Secondary | ICD-10-CM | POA: Diagnosis not present

## 2020-08-24 DIAGNOSIS — E782 Mixed hyperlipidemia: Secondary | ICD-10-CM | POA: Diagnosis not present

## 2020-08-24 DIAGNOSIS — K432 Incisional hernia without obstruction or gangrene: Secondary | ICD-10-CM | POA: Diagnosis not present

## 2020-08-24 DIAGNOSIS — Z6828 Body mass index (BMI) 28.0-28.9, adult: Secondary | ICD-10-CM | POA: Diagnosis not present

## 2020-08-24 DIAGNOSIS — Z1331 Encounter for screening for depression: Secondary | ICD-10-CM | POA: Diagnosis not present

## 2020-08-24 DIAGNOSIS — Z0001 Encounter for general adult medical examination with abnormal findings: Secondary | ICD-10-CM | POA: Diagnosis not present

## 2020-08-30 DIAGNOSIS — Z23 Encounter for immunization: Secondary | ICD-10-CM | POA: Diagnosis not present

## 2020-09-28 DIAGNOSIS — H04123 Dry eye syndrome of bilateral lacrimal glands: Secondary | ICD-10-CM | POA: Diagnosis not present

## 2020-09-28 DIAGNOSIS — H401123 Primary open-angle glaucoma, left eye, severe stage: Secondary | ICD-10-CM | POA: Diagnosis not present

## 2020-09-28 DIAGNOSIS — H401112 Primary open-angle glaucoma, right eye, moderate stage: Secondary | ICD-10-CM | POA: Diagnosis not present

## 2020-12-20 DIAGNOSIS — I10 Essential (primary) hypertension: Secondary | ICD-10-CM | POA: Diagnosis not present

## 2020-12-20 DIAGNOSIS — G301 Alzheimer's disease with late onset: Secondary | ICD-10-CM | POA: Diagnosis not present

## 2020-12-20 DIAGNOSIS — D649 Anemia, unspecified: Secondary | ICD-10-CM | POA: Diagnosis not present

## 2020-12-20 DIAGNOSIS — R946 Abnormal results of thyroid function studies: Secondary | ICD-10-CM | POA: Diagnosis not present

## 2020-12-20 DIAGNOSIS — E7849 Other hyperlipidemia: Secondary | ICD-10-CM | POA: Diagnosis not present

## 2020-12-20 DIAGNOSIS — N183 Chronic kidney disease, stage 3 unspecified: Secondary | ICD-10-CM | POA: Diagnosis not present

## 2020-12-20 DIAGNOSIS — K219 Gastro-esophageal reflux disease without esophagitis: Secondary | ICD-10-CM | POA: Diagnosis not present

## 2020-12-20 DIAGNOSIS — E782 Mixed hyperlipidemia: Secondary | ICD-10-CM | POA: Diagnosis not present

## 2020-12-23 DIAGNOSIS — F331 Major depressive disorder, recurrent, moderate: Secondary | ICD-10-CM | POA: Diagnosis not present

## 2020-12-23 DIAGNOSIS — K589 Irritable bowel syndrome without diarrhea: Secondary | ICD-10-CM | POA: Diagnosis not present

## 2020-12-23 DIAGNOSIS — E039 Hypothyroidism, unspecified: Secondary | ICD-10-CM | POA: Diagnosis not present

## 2020-12-23 DIAGNOSIS — K432 Incisional hernia without obstruction or gangrene: Secondary | ICD-10-CM | POA: Diagnosis not present

## 2020-12-23 DIAGNOSIS — I10 Essential (primary) hypertension: Secondary | ICD-10-CM | POA: Diagnosis not present

## 2020-12-23 DIAGNOSIS — E7849 Other hyperlipidemia: Secondary | ICD-10-CM | POA: Diagnosis not present

## 2020-12-23 DIAGNOSIS — K2100 Gastro-esophageal reflux disease with esophagitis, without bleeding: Secondary | ICD-10-CM | POA: Diagnosis not present

## 2020-12-23 DIAGNOSIS — G301 Alzheimer's disease with late onset: Secondary | ICD-10-CM | POA: Diagnosis not present

## 2020-12-24 DIAGNOSIS — D649 Anemia, unspecified: Secondary | ICD-10-CM | POA: Diagnosis not present

## 2021-01-07 DIAGNOSIS — D649 Anemia, unspecified: Secondary | ICD-10-CM | POA: Diagnosis not present

## 2021-02-16 DIAGNOSIS — E039 Hypothyroidism, unspecified: Secondary | ICD-10-CM | POA: Diagnosis not present

## 2021-02-16 DIAGNOSIS — D519 Vitamin B12 deficiency anemia, unspecified: Secondary | ICD-10-CM | POA: Diagnosis not present

## 2021-02-16 DIAGNOSIS — D649 Anemia, unspecified: Secondary | ICD-10-CM | POA: Diagnosis not present

## 2021-02-16 DIAGNOSIS — K2100 Gastro-esophageal reflux disease with esophagitis, without bleeding: Secondary | ICD-10-CM | POA: Diagnosis not present

## 2021-02-16 DIAGNOSIS — E782 Mixed hyperlipidemia: Secondary | ICD-10-CM | POA: Diagnosis not present

## 2021-02-16 DIAGNOSIS — D529 Folate deficiency anemia, unspecified: Secondary | ICD-10-CM | POA: Diagnosis not present

## 2021-02-16 DIAGNOSIS — E7849 Other hyperlipidemia: Secondary | ICD-10-CM | POA: Diagnosis not present

## 2021-03-04 DIAGNOSIS — M7062 Trochanteric bursitis, left hip: Secondary | ICD-10-CM | POA: Diagnosis not present

## 2021-03-04 DIAGNOSIS — Z6828 Body mass index (BMI) 28.0-28.9, adult: Secondary | ICD-10-CM | POA: Diagnosis not present

## 2021-04-21 DIAGNOSIS — D519 Vitamin B12 deficiency anemia, unspecified: Secondary | ICD-10-CM | POA: Diagnosis not present

## 2021-04-21 DIAGNOSIS — E039 Hypothyroidism, unspecified: Secondary | ICD-10-CM | POA: Diagnosis not present

## 2021-04-21 DIAGNOSIS — E782 Mixed hyperlipidemia: Secondary | ICD-10-CM | POA: Diagnosis not present

## 2021-04-21 DIAGNOSIS — I10 Essential (primary) hypertension: Secondary | ICD-10-CM | POA: Diagnosis not present

## 2021-04-21 DIAGNOSIS — D649 Anemia, unspecified: Secondary | ICD-10-CM | POA: Diagnosis not present

## 2021-04-21 DIAGNOSIS — E7849 Other hyperlipidemia: Secondary | ICD-10-CM | POA: Diagnosis not present

## 2021-04-21 DIAGNOSIS — N183 Chronic kidney disease, stage 3 unspecified: Secondary | ICD-10-CM | POA: Diagnosis not present

## 2021-04-21 DIAGNOSIS — K219 Gastro-esophageal reflux disease without esophagitis: Secondary | ICD-10-CM | POA: Diagnosis not present

## 2021-04-21 DIAGNOSIS — D529 Folate deficiency anemia, unspecified: Secondary | ICD-10-CM | POA: Diagnosis not present

## 2021-04-26 DIAGNOSIS — F331 Major depressive disorder, recurrent, moderate: Secondary | ICD-10-CM | POA: Diagnosis not present

## 2021-04-26 DIAGNOSIS — K2100 Gastro-esophageal reflux disease with esophagitis, without bleeding: Secondary | ICD-10-CM | POA: Diagnosis not present

## 2021-04-26 DIAGNOSIS — I10 Essential (primary) hypertension: Secondary | ICD-10-CM | POA: Diagnosis not present

## 2021-04-26 DIAGNOSIS — E039 Hypothyroidism, unspecified: Secondary | ICD-10-CM | POA: Diagnosis not present

## 2021-04-26 DIAGNOSIS — G301 Alzheimer's disease with late onset: Secondary | ICD-10-CM | POA: Diagnosis not present

## 2021-04-26 DIAGNOSIS — E7849 Other hyperlipidemia: Secondary | ICD-10-CM | POA: Diagnosis not present

## 2021-04-26 DIAGNOSIS — K589 Irritable bowel syndrome without diarrhea: Secondary | ICD-10-CM | POA: Diagnosis not present

## 2021-04-26 DIAGNOSIS — K432 Incisional hernia without obstruction or gangrene: Secondary | ICD-10-CM | POA: Diagnosis not present

## 2021-06-16 DIAGNOSIS — Z23 Encounter for immunization: Secondary | ICD-10-CM | POA: Diagnosis not present

## 2021-11-03 DIAGNOSIS — E039 Hypothyroidism, unspecified: Secondary | ICD-10-CM | POA: Diagnosis not present

## 2021-11-03 DIAGNOSIS — D649 Anemia, unspecified: Secondary | ICD-10-CM | POA: Diagnosis not present

## 2021-11-03 DIAGNOSIS — D519 Vitamin B12 deficiency anemia, unspecified: Secondary | ICD-10-CM | POA: Diagnosis not present

## 2021-11-03 DIAGNOSIS — E7849 Other hyperlipidemia: Secondary | ICD-10-CM | POA: Diagnosis not present

## 2021-11-03 DIAGNOSIS — I10 Essential (primary) hypertension: Secondary | ICD-10-CM | POA: Diagnosis not present

## 2021-11-03 DIAGNOSIS — N183 Chronic kidney disease, stage 3 unspecified: Secondary | ICD-10-CM | POA: Diagnosis not present

## 2021-11-03 DIAGNOSIS — K2100 Gastro-esophageal reflux disease with esophagitis, without bleeding: Secondary | ICD-10-CM | POA: Diagnosis not present

## 2021-11-03 DIAGNOSIS — D529 Folate deficiency anemia, unspecified: Secondary | ICD-10-CM | POA: Diagnosis not present

## 2021-11-03 DIAGNOSIS — E782 Mixed hyperlipidemia: Secondary | ICD-10-CM | POA: Diagnosis not present

## 2021-11-08 DIAGNOSIS — D692 Other nonthrombocytopenic purpura: Secondary | ICD-10-CM | POA: Diagnosis not present

## 2021-11-08 DIAGNOSIS — Z0001 Encounter for general adult medical examination with abnormal findings: Secondary | ICD-10-CM | POA: Diagnosis not present

## 2021-11-08 DIAGNOSIS — K589 Irritable bowel syndrome without diarrhea: Secondary | ICD-10-CM | POA: Diagnosis not present

## 2021-11-08 DIAGNOSIS — F331 Major depressive disorder, recurrent, moderate: Secondary | ICD-10-CM | POA: Diagnosis not present

## 2021-11-08 DIAGNOSIS — E7849 Other hyperlipidemia: Secondary | ICD-10-CM | POA: Diagnosis not present

## 2021-11-08 DIAGNOSIS — I10 Essential (primary) hypertension: Secondary | ICD-10-CM | POA: Diagnosis not present

## 2021-11-08 DIAGNOSIS — K432 Incisional hernia without obstruction or gangrene: Secondary | ICD-10-CM | POA: Diagnosis not present

## 2021-11-08 DIAGNOSIS — G301 Alzheimer's disease with late onset: Secondary | ICD-10-CM | POA: Diagnosis not present

## 2022-03-28 DIAGNOSIS — H35373 Puckering of macula, bilateral: Secondary | ICD-10-CM | POA: Diagnosis not present

## 2022-03-28 DIAGNOSIS — H401112 Primary open-angle glaucoma, right eye, moderate stage: Secondary | ICD-10-CM | POA: Diagnosis not present

## 2022-03-28 DIAGNOSIS — H401123 Primary open-angle glaucoma, left eye, severe stage: Secondary | ICD-10-CM | POA: Diagnosis not present

## 2022-03-28 DIAGNOSIS — H353131 Nonexudative age-related macular degeneration, bilateral, early dry stage: Secondary | ICD-10-CM | POA: Diagnosis not present

## 2022-05-15 DIAGNOSIS — D649 Anemia, unspecified: Secondary | ICD-10-CM | POA: Diagnosis not present

## 2022-05-15 DIAGNOSIS — E039 Hypothyroidism, unspecified: Secondary | ICD-10-CM | POA: Diagnosis not present

## 2022-05-15 DIAGNOSIS — I10 Essential (primary) hypertension: Secondary | ICD-10-CM | POA: Diagnosis not present

## 2022-05-15 DIAGNOSIS — N183 Chronic kidney disease, stage 3 unspecified: Secondary | ICD-10-CM | POA: Diagnosis not present

## 2022-05-15 DIAGNOSIS — K2100 Gastro-esophageal reflux disease with esophagitis, without bleeding: Secondary | ICD-10-CM | POA: Diagnosis not present

## 2022-05-15 DIAGNOSIS — E7849 Other hyperlipidemia: Secondary | ICD-10-CM | POA: Diagnosis not present

## 2022-05-18 DIAGNOSIS — K432 Incisional hernia without obstruction or gangrene: Secondary | ICD-10-CM | POA: Diagnosis not present

## 2022-05-18 DIAGNOSIS — E039 Hypothyroidism, unspecified: Secondary | ICD-10-CM | POA: Diagnosis not present

## 2022-05-18 DIAGNOSIS — M170 Bilateral primary osteoarthritis of knee: Secondary | ICD-10-CM | POA: Diagnosis not present

## 2022-05-18 DIAGNOSIS — G301 Alzheimer's disease with late onset: Secondary | ICD-10-CM | POA: Diagnosis not present

## 2022-05-18 DIAGNOSIS — F331 Major depressive disorder, recurrent, moderate: Secondary | ICD-10-CM | POA: Diagnosis not present

## 2022-05-18 DIAGNOSIS — K589 Irritable bowel syndrome without diarrhea: Secondary | ICD-10-CM | POA: Diagnosis not present

## 2022-05-18 DIAGNOSIS — M810 Age-related osteoporosis without current pathological fracture: Secondary | ICD-10-CM | POA: Diagnosis not present

## 2022-05-18 DIAGNOSIS — K2100 Gastro-esophageal reflux disease with esophagitis, without bleeding: Secondary | ICD-10-CM | POA: Diagnosis not present

## 2022-05-18 DIAGNOSIS — E7849 Other hyperlipidemia: Secondary | ICD-10-CM | POA: Diagnosis not present

## 2022-05-18 DIAGNOSIS — I10 Essential (primary) hypertension: Secondary | ICD-10-CM | POA: Diagnosis not present

## 2022-05-18 DIAGNOSIS — D692 Other nonthrombocytopenic purpura: Secondary | ICD-10-CM | POA: Diagnosis not present

## 2022-05-18 DIAGNOSIS — Z23 Encounter for immunization: Secondary | ICD-10-CM | POA: Diagnosis not present

## 2022-08-09 DIAGNOSIS — J019 Acute sinusitis, unspecified: Secondary | ICD-10-CM | POA: Diagnosis not present

## 2022-08-09 DIAGNOSIS — Z6827 Body mass index (BMI) 27.0-27.9, adult: Secondary | ICD-10-CM | POA: Diagnosis not present

## 2022-08-09 DIAGNOSIS — Z20828 Contact with and (suspected) exposure to other viral communicable diseases: Secondary | ICD-10-CM | POA: Diagnosis not present

## 2022-08-09 DIAGNOSIS — R030 Elevated blood-pressure reading, without diagnosis of hypertension: Secondary | ICD-10-CM | POA: Diagnosis not present

## 2022-08-09 DIAGNOSIS — R059 Cough, unspecified: Secondary | ICD-10-CM | POA: Diagnosis not present

## 2022-10-06 DIAGNOSIS — H401123 Primary open-angle glaucoma, left eye, severe stage: Secondary | ICD-10-CM | POA: Diagnosis not present

## 2022-10-06 DIAGNOSIS — H401112 Primary open-angle glaucoma, right eye, moderate stage: Secondary | ICD-10-CM | POA: Diagnosis not present

## 2022-10-09 ENCOUNTER — Emergency Department (HOSPITAL_BASED_OUTPATIENT_CLINIC_OR_DEPARTMENT_OTHER): Payer: Medicare Other | Admitting: Radiology

## 2022-10-09 ENCOUNTER — Emergency Department (HOSPITAL_BASED_OUTPATIENT_CLINIC_OR_DEPARTMENT_OTHER)
Admission: EM | Admit: 2022-10-09 | Discharge: 2022-10-09 | Disposition: A | Discharge status: Home / Self Care | Payer: Medicare Other | Attending: Emergency Medicine | Admitting: Emergency Medicine

## 2022-10-09 ENCOUNTER — Emergency Department (HOSPITAL_BASED_OUTPATIENT_CLINIC_OR_DEPARTMENT_OTHER): Payer: Medicare Other

## 2022-10-09 ENCOUNTER — Other Ambulatory Visit: Payer: Self-pay

## 2022-10-09 DIAGNOSIS — I6782 Cerebral ischemia: Secondary | ICD-10-CM | POA: Diagnosis not present

## 2022-10-09 DIAGNOSIS — K449 Diaphragmatic hernia without obstruction or gangrene: Secondary | ICD-10-CM | POA: Diagnosis not present

## 2022-10-09 DIAGNOSIS — J181 Lobar pneumonia, unspecified organism: Secondary | ICD-10-CM | POA: Diagnosis not present

## 2022-10-09 DIAGNOSIS — M4802 Spinal stenosis, cervical region: Secondary | ICD-10-CM | POA: Diagnosis not present

## 2022-10-09 DIAGNOSIS — W1839XA Other fall on same level, initial encounter: Secondary | ICD-10-CM | POA: Diagnosis not present

## 2022-10-09 DIAGNOSIS — M7989 Other specified soft tissue disorders: Secondary | ICD-10-CM | POA: Diagnosis not present

## 2022-10-09 DIAGNOSIS — Z043 Encounter for examination and observation following other accident: Secondary | ICD-10-CM | POA: Diagnosis not present

## 2022-10-09 DIAGNOSIS — S42031A Displaced fracture of lateral end of right clavicle, initial encounter for closed fracture: Secondary | ICD-10-CM

## 2022-10-09 DIAGNOSIS — Y9301 Activity, walking, marching and hiking: Secondary | ICD-10-CM | POA: Insufficient documentation

## 2022-10-09 DIAGNOSIS — Z6828 Body mass index (BMI) 28.0-28.9, adult: Secondary | ICD-10-CM | POA: Diagnosis not present

## 2022-10-09 DIAGNOSIS — N200 Calculus of kidney: Secondary | ICD-10-CM | POA: Insufficient documentation

## 2022-10-09 DIAGNOSIS — J189 Pneumonia, unspecified organism: Secondary | ICD-10-CM

## 2022-10-09 DIAGNOSIS — F0390 Unspecified dementia without behavioral disturbance: Secondary | ICD-10-CM | POA: Diagnosis not present

## 2022-10-09 DIAGNOSIS — S82831A Other fracture of upper and lower end of right fibula, initial encounter for closed fracture: Secondary | ICD-10-CM

## 2022-10-09 DIAGNOSIS — Z91040 Latex allergy status: Secondary | ICD-10-CM | POA: Diagnosis not present

## 2022-10-09 DIAGNOSIS — E663 Overweight: Secondary | ICD-10-CM | POA: Diagnosis not present

## 2022-10-09 DIAGNOSIS — W19XXXA Unspecified fall, initial encounter: Secondary | ICD-10-CM

## 2022-10-09 DIAGNOSIS — M4312 Spondylolisthesis, cervical region: Secondary | ICD-10-CM | POA: Diagnosis not present

## 2022-10-09 DIAGNOSIS — I700 Atherosclerosis of aorta: Secondary | ICD-10-CM | POA: Insufficient documentation

## 2022-10-09 DIAGNOSIS — M25511 Pain in right shoulder: Secondary | ICD-10-CM | POA: Diagnosis not present

## 2022-10-09 DIAGNOSIS — S4991XA Unspecified injury of right shoulder and upper arm, initial encounter: Secondary | ICD-10-CM | POA: Diagnosis present

## 2022-10-09 DIAGNOSIS — M79671 Pain in right foot: Secondary | ICD-10-CM | POA: Diagnosis not present

## 2022-10-09 LAB — CBC WITH DIFFERENTIAL/PLATELET
Abs Immature Granulocytes: 0.06 10*3/uL (ref 0.00–0.07)
Basophils Absolute: 0 10*3/uL (ref 0.0–0.1)
Basophils Relative: 0 %
Eosinophils Absolute: 0 10*3/uL (ref 0.0–0.5)
Eosinophils Relative: 0 %
HCT: 35.1 % — ABNORMAL LOW (ref 36.0–46.0)
Hemoglobin: 11.2 g/dL — ABNORMAL LOW (ref 12.0–15.0)
Immature Granulocytes: 1 %
Lymphocytes Relative: 10 %
Lymphs Abs: 1.2 10*3/uL (ref 0.7–4.0)
MCH: 28.5 pg (ref 26.0–34.0)
MCHC: 31.9 g/dL (ref 30.0–36.0)
MCV: 89.3 fL (ref 80.0–100.0)
Monocytes Absolute: 1 10*3/uL (ref 0.1–1.0)
Monocytes Relative: 9 %
Neutro Abs: 9.5 10*3/uL — ABNORMAL HIGH (ref 1.7–7.7)
Neutrophils Relative %: 80 %
Platelets: 198 10*3/uL (ref 150–400)
RBC: 3.93 MIL/uL (ref 3.87–5.11)
RDW: 14.1 % (ref 11.5–15.5)
WBC: 11.8 10*3/uL — ABNORMAL HIGH (ref 4.0–10.5)
nRBC: 0 % (ref 0.0–0.2)

## 2022-10-09 LAB — URINALYSIS, W/ REFLEX TO CULTURE (INFECTION SUSPECTED)
Bacteria, UA: NONE SEEN
Bilirubin Urine: NEGATIVE
Glucose, UA: NEGATIVE mg/dL
Ketones, ur: NEGATIVE mg/dL
Leukocytes,Ua: NEGATIVE
Nitrite: POSITIVE — AB
Specific Gravity, Urine: >1.046 — ABNORMAL HIGH (ref 1.005–1.030)
pH: 6.5 (ref 5.0–8.0)

## 2022-10-09 LAB — BASIC METABOLIC PANEL
Anion gap: 10 (ref 5–15)
BUN: 23 mg/dL (ref 8–23)
CO2: 28 mmol/L (ref 22–32)
Calcium: 9.3 mg/dL (ref 8.9–10.3)
Chloride: 104 mmol/L (ref 98–111)
Creatinine, Ser: 0.77 mg/dL (ref 0.44–1.00)
GFR, Estimated: >60 mL/min (ref >60)
Glucose, Bld: 113 mg/dL — ABNORMAL HIGH (ref 70–99)
Potassium: 3.7 mmol/L (ref 3.5–5.1)
Sodium: 142 mmol/L (ref 135–145)

## 2022-10-09 LAB — TROPONIN I (HIGH SENSITIVITY)
Troponin I (High Sensitivity): 16 ng/L (ref <18)
Troponin I (High Sensitivity): 18 ng/L — ABNORMAL HIGH (ref <18)

## 2022-10-09 MED ORDER — HYDROCODONE-ACETAMINOPHEN 5-325 MG PO TABS: 1.0000 | ORAL_TABLET | Freq: Four times a day (QID) | ORAL | 0 refills | Status: AC | PRN

## 2022-10-09 MED ORDER — DOXYCYCLINE HYCLATE 100 MG PO CAPS: 100.0000 mg | ORAL_CAPSULE | Freq: Two times a day (BID) | ORAL | 0 refills | Status: AC

## 2022-10-09 MED ORDER — SODIUM CHLORIDE 0.9 % IV SOLN
2.0000 g | INTRAVENOUS | Status: DC
  Administered 2022-10-09: 2 g via INTRAVENOUS
  Filled 2022-10-09: qty 20

## 2022-10-09 MED ORDER — IOHEXOL 300 MG/ML  SOLN
100.0000 mL | Freq: Once | INTRAMUSCULAR | Status: AC | PRN
  Administered 2022-10-09: 85 mL via INTRAVENOUS

## 2022-10-09 MED ORDER — CEPHALEXIN 500 MG PO CAPS: 500.0000 mg | ORAL_CAPSULE | Freq: Four times a day (QID) | ORAL | 0 refills | Status: AC

## 2022-10-09 MED ORDER — SODIUM CHLORIDE 0.9 % IV SOLN
500.0000 mg | INTRAVENOUS | Status: DC
  Administered 2022-10-09: 500 mg via INTRAVENOUS
  Filled 2022-10-09: qty 5

## 2022-10-09 NOTE — Discharge Instructions (Signed)
Is call orthopedics, Dr. Lucia Gaskins to schedule follow-up for evaluation in clinic and consideration for surgical management as needed.  You need to be nonweightbearing to the right foot.  Utilize a walker for assistance with ambulation and transferring from the commode or bed.  Home health PT has been ordered.  Your x-ray imaging revealed possible pneumonia however you do not have an oxygen requirement your vitals are reassuring.  Will discharge you on antibiotics.  Pain control has also been prescribed.  Return to the emergency department for any worsening of your condition any other concerns.

## 2022-10-09 NOTE — ED Notes (Signed)
Pt aware of the need for a urine... Unable to currently but a [place] applied.

## 2022-10-09 NOTE — ED Triage Notes (Signed)
Pt to ED c/o unwitnessed fall this morning around 8 am, pt has hx of dementia, accompanied with daughter. C/o right arm injury and right foot. Pt is not on blood thinners. Unknown LOC.

## 2022-10-09 NOTE — ED Provider Notes (Addendum)
  Physical Exam  BP (!) 151/97 (BP Location: Right Wrist)   Pulse 86   Temp 98.2 F (36.8 C) (Oral)   Resp (!) 21   Ht '5\' 5"'$  (1.651 m)   Wt 77.1 kg   SpO2 94%   BMI 28.29 kg/m     Procedures  .Ortho Injury Treatment  Date/Time: 10/09/2022 9:02 PM  Performed by: Regan Lemming, MD Authorized by: Regan Lemming, MD   Consent:    Consent obtained:  Verbal   Consent given by:  PatientInjury location: ankle Location details: right ankle Injury type: fracture Fracture type: lateral malleolus Pre-procedure neurovascular assessment: neurovascularly intact  Patient sedated: NoImmobilization: splint Splint type: short leg Splint Applied by: ED Tech Post-procedure neurovascular assessment: post-procedure neurovascularly intact     ED Course / MDM    Medical Decision Making Amount and/or Complexity of Data Reviewed Labs: ordered. Radiology: ordered.  Risk Prescription drug management.   Assumed care of the patient from Dr. Sherry Ruffing, please see his attestation for MDM.  The patient's workup so far is concerning for commune acquired pneumonia, distal right clavicle fracture, acute distal fibular fracture on the right.  Previous team had consulted hospitalist medicine for admission however when I spoke with on-call hospitalist with a did not see an indication for immediate admission.  Patient reexamined, presented after questionable syncopal episode, will obtain urinalysis, troponin and reassess.  Currently receiving IV antibiotics for diagnosis of pneumonia.  Discussed the care of the patient with Dr. Bridgett Larsson.  With no clear history of syncope, no current indication for admission to the hospital at this time.  The patient's right lower extremity was splinted, neurovascular intact status post splinting.  Her right shoulder was placed in an immobilizer given her clavicular fracture.  I discussed with the patient's daughter the lack of indication for admission per hospitalist medicine in  the setting of a fall with no clear syncope, discussed admission for observation and PT versus discharge with antibiotics and home health PT.  Will plan to treat the patient for possible pneumonia seen on CT imaging with antibiotics. She has a PORT score of 75.  Home health PT consult was placed.  Plan for outpatient orthopedics follow-up with [facility].  Return precautions provided, stable for discharge.       Regan Lemming, MD 10/09/22 2111    Regan Lemming, MD 10/09/22 2115

## 2022-10-09 NOTE — ED Notes (Signed)
Discharge paperwork given and verbally understood by the Pt and family.

## 2022-10-09 NOTE — ED Provider Notes (Cosign Needed Addendum)
[facility] Provider Note   CSN: [id number] Arrival date & time: 10/09/22  1048     History  Chief Complaint  Patient presents with   Fall   Arm Injury    right   Foot Injury    right    [name] is a 88 y.o. female with extensive past medical history including osteoporosis, syncope, dementia presenting following a fall this morning.  History obtained from patient and her family member who are in the room.  She states that this morning, she was walking back from the bathroom to her bed when she tried to sit down in the bed and then fell.  She possibly blacked out around the time this happened.  She does not have much other memory of the event.  She says that the next thing she remembers that she was on the floor and unable to get up so she started being on the floor.  Her family heard her and then came up to get her and then came to the ED.  Family member states that this is her second fall in the past few weeks.  She has not been able to follow-up with her PCP regarding this.  She denies any chest pain, dyspnea, abdominal pain, dysuria, nausea vomiting or diarrhea.  She endorses pain over the posterior side of her head and neck, her right shoulder and right arm, right ankle.   Home Medications Prior to Admission medications   Medication Sig Start Date End Date Taking? Authorizing Provider  acetaminophen (TYLENOL) 650 MG CR tablet Take 650-1,300 mg by mouth every 8 (eight) hours as needed for pain.    [provider]  Calcium Carb-Cholecalciferol (CALCIUM 600 + D PO) Take 2 tablets by mouth daily.    [provider]  famotidine (PEPCID) 20 MG tablet Take 20 mg by mouth daily. 12/02/19   [provider]  latanoprost (XALATAN) 0.005 % ophthalmic solution Place 1 drop into both eyes at bedtime. 12/17/17   [provider]  levothyroxine (SYNTHROID, LEVOTHROID) 88 MCG tablet Take 88 mcg by mouth daily before  breakfast.    [provider]  lisinopril (ZESTRIL) 10 MG tablet Take 10 mg by mouth daily.  12/05/18 12/26/19  [provider]  Menthol, Topical Analgesic, (GNP COLD/HOT PAIN RELIEF PATCH EX) Apply 1 patch topically daily as needed (pain).    [provider]  Multiple Vitamins-Minerals (CENTRUM SILVER ADULT 50+ PO) Take 2 capsules by mouth daily.     [provider]  Multiple Vitamins-Minerals (PRESERVISION AREDS PO) Take 1 tablet by mouth daily.     [provider]  pantoprazole (PROTONIX) 40 MG tablet Take 40 mg by mouth daily.     [provider]  pravastatin (PRAVACHOL) 20 MG tablet Take 20 mg by mouth every morning.     [provider]  risperiDONE (RISPERDAL) 0.5 MG tablet Take 0.5 mg by mouth at bedtime. 08/04/19   [provider]  sertraline (ZOLOFT) 25 MG tablet Take 25 mg by mouth daily. 12/17/19   [provider]  traMADol (ULTRAM) 50 MG tablet Take 50 mg by mouth daily as needed for severe pain.     [provider]      Allergies    Cortisone, Metronidazole, Oxycodone, Ciprofloxacin, and Latex    Review of Systems   See HPI  Physical Exam Updated Vital Signs BP (!) 126/92   Pulse 79   Temp 97.9 F (  36.6 C) (Oral)   Resp 20   Ht '5\' 5"'$  (1.651 m)   Wt 77.1 kg   SpO2 97%   BMI 28.29 kg/m  Physical Exam Constitutional:      General: She is not in acute distress.    Appearance: Normal appearance.  HENT:     Head: Normocephalic and atraumatic.  Eyes:     Extraocular Movements: Extraocular movements intact.     Conjunctiva/sclera: Conjunctivae normal.     Pupils: Pupils are equal, round, and reactive to light.  Cardiovascular:     Rate and Rhythm: Normal rate and regular rhythm.     Heart sounds: Normal heart sounds.  Pulmonary:     Effort: Pulmonary effort is normal. No respiratory distress.     Breath sounds: Normal breath sounds.  Abdominal:     General: There is no distension.      Palpations: Abdomen is soft.     Tenderness: There is no abdominal tenderness.  Musculoskeletal:        General: Swelling, tenderness, deformity and signs of injury present.     Cervical back: Normal range of motion and neck supple. No rigidity.     Comments: Clear deformity over right ankle with associated swelling and ecchymosis.  Tenderness to palpation over right clavicle, shoulder, humerus, right ribs, posterior head and neck.  Skin:    General: Skin is warm and dry.  Neurological:     General: No focal deficit present.     Mental Status: She is alert. Mental status is at baseline.     Comments: Alert and oriented x 2, baseline per her family member  Psychiatric:        Mood and Affect: Mood normal.        Behavior: Behavior normal.     ED Results / Procedures / Treatments   Labs (all labs ordered are listed, but only abnormal results are displayed) Labs Reviewed  CBC WITH DIFFERENTIAL/PLATELET - Abnormal; Notable for the following components:      Result Value   WBC 11.8 (*)    Hemoglobin 11.2 (*)    HCT 35.1 (*)    Neutro Abs 9.5 (*)    All other components within normal limits  BASIC METABOLIC PANEL - Abnormal; Notable for the following components:   Glucose, Bld 113 (*)    All other components within normal limits  URINALYSIS, W/ REFLEX TO CULTURE (INFECTION SUSPECTED)    EKG EKG Interpretation  Date/Time:  Monday October 09 2022 13:51:00 EST Ventricular Rate:  75 PR Interval:  210 QRS Duration: 97 QT Interval:  382 QTC Calculation: 427 R Axis:   -40 Text Interpretation: Sinus rhythm Atrial premature complex Left anterior fascicular block Left ventricular hypertrophy Anterior Q waves, possibly due to LVH when compared to prior, les PVC. No STEMI Confirmed by Antony Blackbird [phone]) on 10/09/2022 2:49:56 PM  Radiology CT CHEST ABDOMEN PELVIS W CONTRAST  Addendum Date: 10/09/2022   ADDENDUM REPORT: 10/09/2022 15:21 ADDENDUM: Upon further review, there is  noted a mildly displaced distal right clavicular fracture. This was communicated to the patient's provider. Electronically Signed   By: Marijo Conception M.D.   On: 10/09/2022 15:21   Result Date: 10/09/2022 CLINICAL DATA:  Unwitnessed fall. EXAM: CT CHEST, ABDOMEN, AND PELVIS WITH CONTRAST TECHNIQUE: Multidetector CT imaging of the chest, abdomen and pelvis was performed following the standard protocol during bolus administration of intravenous contrast. RADIATION DOSE REDUCTION: This exam was performed according to the departmental dose-optimization program  which includes automated exposure control, adjustment of the mA and/or kV according to patient size and/or use of iterative reconstruction technique. CONTRAST:  30m OMNIPAQUE IOHEXOL 300 MG/ML  SOLN COMPARISON:  February 22, 2018. FINDINGS: CT CHEST FINDINGS Cardiovascular: Atherosclerosis of thoracic aorta is noted without aneurysm or dissection. Normal cardiac size. No pericardial effusion. Mediastinum/Nodes: Large sliding-type hiatal hernia. No adenopathy is noted. Status post thyroidectomy. Lungs/Pleura: No pneumothorax or pleural effusion is noted. Left lung is unremarkable. Mild opacity is seen in posterior portion of right upper lobe concerning for pneumonia. Musculoskeletal: No chest wall mass or suspicious bone lesions identified. CT ABDOMEN PELVIS FINDINGS Hepatobiliary: Status post cholecystectomy. Mild intrahepatic and extrahepatic biliary dilatation is noted most likely due to post cholecystectomy status. Right hepatic cyst is noted. Pancreas: Unremarkable. No pancreatic ductal dilatation or surrounding inflammatory changes. Spleen: Normal in size without focal abnormality. Adrenals/Urinary Tract: Adrenal glands appear normal. Stable bilateral renal cysts are noted for which no further follow-up is required. Small nonobstructive right renal calculus is noted. No hydronephrosis or renal obstruction is noted. Urinary bladder is unremarkable.  Stomach/Bowel: The stomach is unremarkable. There is no evidence of bowel obstruction or inflammation. Vascular/Lymphatic: Aortic atherosclerosis. No enlarged abdominal or pelvic lymph nodes. Probable 11 mm calcified distal splenic artery aneurysm is noted. Reproductive: Uterus and bilateral adnexa are unremarkable. Other: No abdominal wall hernia or abnormality. No abdominopelvic ascites. Musculoskeletal: No acute or significant osseous findings. IMPRESSION: Large sliding-type hiatal hernia. Mild opacity seen in posterior portion of right upper lobe concerning for pneumonia. Small nonobstructive right renal calculus. Probable 11 mm calcified distal splenic artery aneurysm. Aortic Atherosclerosis (ICD10-I70.0). Electronically Signed: By: JMarijo ConceptionM.D. On: 10/09/2022 14:53   CT Head Wo Contrast  Result Date: 10/09/2022 CLINICAL DATA:  Head and neck trauma. Unwitnessed fall. Possible loss of consciousness. EXAM: CT HEAD WITHOUT CONTRAST CT CERVICAL SPINE WITHOUT CONTRAST TECHNIQUE: Multidetector CT imaging of the head and cervical spine was performed following the standard protocol without intravenous contrast. Multiplanar CT image reconstructions of the cervical spine were also generated. RADIATION DOSE REDUCTION: This exam was performed according to the departmental dose-optimization program which includes automated exposure control, adjustment of the mA and/or kV according to patient size and/or use of iterative reconstruction technique. COMPARISON:  Head CT 07/08/2018.  Cervical spine CT 08/23/2016. FINDINGS: CT HEAD FINDINGS Brain: There is no evidence of an acute infarct, intracranial hemorrhage, mass, midline shift, or extra-axial fluid collection. Mild cerebral atrophy is within normal limits for age. Hypodensities in the cerebral white matter bilaterally are stable to mildly increased and nonspecific but compatible with mild chronic small vessel ischemic disease. Vascular: Extensive atherosclerotic  calcification at the skull base. No suspicious focal vascular hyperdensity. Skull: No acute fracture or suspicious osseous lesion. Sinuses/Orbits: Visualized paranasal sinuses and mastoid air cells are clear. Bilateral cataract extraction. Other: None. CT CERVICAL SPINE FINDINGS Alignment: Chronic straightening of the normal cervical lordosis and chronic grade 1 anterolisthesis of C4 on C5 and C7 on T1. Skull base and vertebrae: No acute fracture or suspicious osseous lesion. Soft tissues and spinal canal: No prevertebral fluid or swelling. No visible canal hematoma. Disc levels: Moderate median C1-2 arthropathy. Moderate disc degeneration at C5-6 and C6-7. Widespread, advanced cervical facet arthrosis. Baseline capacious cervical spinal canal. Mild-to-moderate bilateral neural foraminal stenosis at C5-6. Upper chest: Reported on the contemporaneous chest CT. Other: Mild left greater than right carotid atherosclerotic calcification. IMPRESSION: 1. No evidence of acute intracranial abnormality. 2. Mild chronic small vessel ischemic disease.  3. No acute cervical spine fracture. Electronically Signed   By: Logan Bores M.D.   On: 10/09/2022 14:48   CT Cervical Spine Wo Contrast  Result Date: 10/09/2022 CLINICAL DATA:  Head and neck trauma. Unwitnessed fall. Possible loss of consciousness. EXAM: CT HEAD WITHOUT CONTRAST CT CERVICAL SPINE WITHOUT CONTRAST TECHNIQUE: Multidetector CT imaging of the head and cervical spine was performed following the standard protocol without intravenous contrast. Multiplanar CT image reconstructions of the cervical spine were also generated. RADIATION DOSE REDUCTION: This exam was performed according to the departmental dose-optimization program which includes automated exposure control, adjustment of the mA and/or kV according to patient size and/or use of iterative reconstruction technique. COMPARISON:  Head CT 07/08/2018.  Cervical spine CT 08/23/2016. FINDINGS: CT HEAD FINDINGS  Brain: There is no evidence of an acute infarct, intracranial hemorrhage, mass, midline shift, or extra-axial fluid collection. Mild cerebral atrophy is within normal limits for age. Hypodensities in the cerebral white matter bilaterally are stable to mildly increased and nonspecific but compatible with mild chronic small vessel ischemic disease. Vascular: Extensive atherosclerotic calcification at the skull base. No suspicious focal vascular hyperdensity. Skull: No acute fracture or suspicious osseous lesion. Sinuses/Orbits: Visualized paranasal sinuses and mastoid air cells are clear. Bilateral cataract extraction. Other: None. CT CERVICAL SPINE FINDINGS Alignment: Chronic straightening of the normal cervical lordosis and chronic grade 1 anterolisthesis of C4 on C5 and C7 on T1. Skull base and vertebrae: No acute fracture or suspicious osseous lesion. Soft tissues and spinal canal: No prevertebral fluid or swelling. No visible canal hematoma. Disc levels: Moderate median C1-2 arthropathy. Moderate disc degeneration at C5-6 and C6-7. Widespread, advanced cervical facet arthrosis. Baseline capacious cervical spinal canal. Mild-to-moderate bilateral neural foraminal stenosis at C5-6. Upper chest: Reported on the contemporaneous chest CT. Other: Mild left greater than right carotid atherosclerotic calcification. IMPRESSION: 1. No evidence of acute intracranial abnormality. 2. Mild chronic small vessel ischemic disease. 3. No acute cervical spine fracture. Electronically Signed   By: Logan Bores M.D.   On: 10/09/2022 14:48   DG Humerus Right  Result Date: 10/09/2022 CLINICAL DATA:  Suspected mid shaft right humerus fracture. Lateral right clavicle acute fracture seen on today's shoulder radiographs. EXAM: RIGHT HUMERUS - 2+ VIEW COMPARISON:  Right shoulder radiographs earlier same day 10/09/2022 FINDINGS: Diffuse decreased bone mineralization. Redemonstration of mildly comminuted and minimally displaced fracture of  the lateral right clavicle shaft. Mild acromioclavicular joint space narrowing and peripheral osteophytosis. No acute fracture is seen within the right humerus. IMPRESSION: Redemonstration of mildly comminuted and minimally displaced fracture of the lateral right clavicle shaft. No acute fracture is seen within the right humerus. Electronically Signed   By: Yvonne Kendall M.D.   On: 10/09/2022 14:17   DG Ankle Complete Right  Result Date: 10/09/2022 CLINICAL DATA:  Fall. Suspected fracture of bone. Right foot pain. Suspicious for distal fibular fracture on today's foot radiographs. EXAM: RIGHT ANKLE - COMPLETE 3+ VIEW COMPARISON:  Right foot radiographs 10/09/2022 FINDINGS: There is again diffuse decreased bone mineralization. There is again moderate to high-grade lateral malleolar soft tissue swelling. On the current dedicated ankle radiographs, there is confirmation of 2 mm cortical step-off within the distal lateral aspect of the fibula, an acute nondisplaced fracture. The ankle mortise remains symmetric and intact. Old well corticated small ossicle distal to the medial malleolus. Moderate-to-large plantar calcaneal heel spur. Minimal dorsal midfoot degenerative osteophytes. Mild vascular calcifications. IMPRESSION: 1. Acute nondisplaced fracture of the distal lateral aspect of the fibula.  2. Moderate to high-grade lateral malleolar soft tissue swelling. Electronically Signed   By: Yvonne Kendall M.D.   On: 10/09/2022 14:14   DG Foot Complete Right  Result Date: 10/09/2022 CLINICAL DATA:  c/o unwitnessed fall this morning around 8 am, C/o right arm injury with limited ROM and right foot pain, swelling, and discoloration. EXAM: RIGHT FOOT COMPLETE - 3+ VIEW COMPARISON:  None available FINDINGS: Extensive soft tissue swelling overlying the lateral malleolus with deformity of the distal fibula suspicious for fracture. Enthesopathic changes are seen at the insertion of the plantar fascia and Achilles tendon on  the calcaneus. Atherosclerotic changes seen throughout visualized arterial segments. IMPRESSION: Deformity of the lateral malleolus with underlying soft tissue swelling is suspicious for fracture. Dedicated ankle radiographs should be obtained. Electronically Signed   By: Miachel Roux M.D.   On: 10/09/2022 11:46   DG Shoulder Right  Result Date: 10/09/2022 CLINICAL DATA:  c/o unwitnessed fall this morning around 8 am, C/o right arm injury with limited ROM and right foot pain, swelling, and discoloration. EXAM: RIGHT SHOULDER - 2+ VIEW COMPARISON:  None available FINDINGS: Moderate spurring of the glenohumeral joint. Loss of subacromial space suspicious for rotator cuff tear/tendinopathy, likely chronic. There is irregularity of the inferior scapular body, possibly related to additional fracture. Mildly comminuted, minimally displaced fracture of the lateral clavicle. Advanced degenerative changes of the cervical spine partially visualized. IMPRESSION: 1. Mildly comminuted, minimally displaced fracture of the lateral clavicle. 2. Irregularity of the inferior scapular body may be related to additional site of fracture. Please correlate with focal tenderness. 3. Moderate degenerative changes of the glenohumeral joint. Electronically Signed   By: Miachel Roux M.D.   On: 10/09/2022 11:44    Procedures  Medications Ordered in ED Medications  cefTRIAXone (ROCEPHIN) 2 g in sodium chloride 0.9 % 100 mL IVPB (has no administration in time range)  azithromycin (ZITHROMAX) 500 mg in sodium chloride 0.9 % 250 mL IVPB (has no administration in time range)  iohexol (OMNIPAQUE) 300 MG/ML solution 100 mL (85 mLs Intravenous Contrast Given 10/09/22 1427)    ED Course/ Medical Decision Making/ A&P   {                          Medical Decision Making Amount and/or Complexity of Data Reviewed Labs: ordered. Radiology: ordered.  Risk Prescription drug management. Decision regarding hospitalization.   [name] is a 88 y.o. female with extensive past medical history including osteoporosis, syncope, dementia presenting following a fall this morning.  History is concerning for possible loss of consciousness preceding the fall.  Fall was also unwitnessed.  On exam, she has clear deformity of her right ankle and also tenderness over right clavicle, right shoulder, right humerus, ribs on the right side as well as posterior side of head and neck.  Requires CT head and neck imaging as well as x-rays of all of the areas of possible fracture.  Additionally, requires further syncope workup due to possible loss of consciousness.  Update: Initial workup shows fractures of the right distal clavicle, right lateral malleolus as well as possible right upper lobe pneumonia and leukocytosis.  She will need admission for further evaluation of syncope and management of fractures. Final Clinical Impression(s) / ED Diagnoses Final diagnoses:  Pneumonia of right upper lobe due to infectious organism  Closed displaced fracture of acromial end of right clavicle, initial encounter  Closed avulsion fracture of distal end of right  fibula, initial encounter    Rx / DC Orders ED Discharge Orders     None         Linus Galas, MD 10/09/22 1529    Linus Galas, MD 10/09/22 2224    Linus Galas, MD 10/09/22 1549    Tegeler, Gwenyth Allegra, MD 10/10/22 [phone]

## 2022-10-11 DIAGNOSIS — M25511 Pain in right shoulder: Secondary | ICD-10-CM | POA: Diagnosis not present

## 2022-10-11 DIAGNOSIS — S8261XA Displaced fracture of lateral malleolus of right fibula, initial encounter for closed fracture: Secondary | ICD-10-CM | POA: Diagnosis not present

## 2022-10-23 DIAGNOSIS — M25571 Pain in right ankle and joints of right foot: Secondary | ICD-10-CM | POA: Diagnosis not present

## 2022-10-23 DIAGNOSIS — S43401A Unspecified sprain of right shoulder joint, initial encounter: Secondary | ICD-10-CM | POA: Diagnosis not present

## 2022-11-08 DIAGNOSIS — I10 Essential (primary) hypertension: Secondary | ICD-10-CM | POA: Diagnosis not present

## 2022-11-08 DIAGNOSIS — K2100 Gastro-esophageal reflux disease with esophagitis, without bleeding: Secondary | ICD-10-CM | POA: Diagnosis not present

## 2022-11-08 DIAGNOSIS — E7849 Other hyperlipidemia: Secondary | ICD-10-CM | POA: Diagnosis not present

## 2022-11-08 DIAGNOSIS — Z0001 Encounter for general adult medical examination with abnormal findings: Secondary | ICD-10-CM | POA: Diagnosis not present

## 2022-11-08 DIAGNOSIS — K219 Gastro-esophageal reflux disease without esophagitis: Secondary | ICD-10-CM | POA: Diagnosis not present

## 2022-11-08 DIAGNOSIS — E039 Hypothyroidism, unspecified: Secondary | ICD-10-CM | POA: Diagnosis not present

## 2022-11-15 DIAGNOSIS — G301 Alzheimer's disease with late onset: Secondary | ICD-10-CM | POA: Diagnosis not present

## 2022-11-15 DIAGNOSIS — K589 Irritable bowel syndrome without diarrhea: Secondary | ICD-10-CM | POA: Diagnosis not present

## 2022-11-15 DIAGNOSIS — E039 Hypothyroidism, unspecified: Secondary | ICD-10-CM | POA: Diagnosis not present

## 2022-11-15 DIAGNOSIS — E7849 Other hyperlipidemia: Secondary | ICD-10-CM | POA: Diagnosis not present

## 2022-11-15 DIAGNOSIS — M810 Age-related osteoporosis without current pathological fracture: Secondary | ICD-10-CM | POA: Diagnosis not present

## 2022-11-15 DIAGNOSIS — Z0001 Encounter for general adult medical examination with abnormal findings: Secondary | ICD-10-CM | POA: Diagnosis not present

## 2022-11-15 DIAGNOSIS — I10 Essential (primary) hypertension: Secondary | ICD-10-CM | POA: Diagnosis not present

## 2022-11-15 DIAGNOSIS — M170 Bilateral primary osteoarthritis of knee: Secondary | ICD-10-CM | POA: Diagnosis not present

## 2022-11-15 DIAGNOSIS — F331 Major depressive disorder, recurrent, moderate: Secondary | ICD-10-CM | POA: Diagnosis not present

## 2022-11-15 DIAGNOSIS — Z23 Encounter for immunization: Secondary | ICD-10-CM | POA: Diagnosis not present

## 2022-11-15 DIAGNOSIS — K432 Incisional hernia without obstruction or gangrene: Secondary | ICD-10-CM | POA: Diagnosis not present

## 2022-11-15 DIAGNOSIS — D692 Other nonthrombocytopenic purpura: Secondary | ICD-10-CM | POA: Diagnosis not present

## 2022-11-17 DIAGNOSIS — H401123 Primary open-angle glaucoma, left eye, severe stage: Secondary | ICD-10-CM | POA: Diagnosis not present

## 2022-11-17 DIAGNOSIS — H401112 Primary open-angle glaucoma, right eye, moderate stage: Secondary | ICD-10-CM | POA: Diagnosis not present

## 2022-11-24 DIAGNOSIS — M25571 Pain in right ankle and joints of right foot: Secondary | ICD-10-CM | POA: Diagnosis not present

## 2022-11-24 DIAGNOSIS — S43401A Unspecified sprain of right shoulder joint, initial encounter: Secondary | ICD-10-CM | POA: Diagnosis not present

## 2022-12-25 DIAGNOSIS — S8261XA Displaced fracture of lateral malleolus of right fibula, initial encounter for closed fracture: Secondary | ICD-10-CM | POA: Diagnosis not present

## 2022-12-25 DIAGNOSIS — S42001A Fracture of unspecified part of right clavicle, initial encounter for closed fracture: Secondary | ICD-10-CM | POA: Diagnosis not present

## 2023-05-10 DIAGNOSIS — E7849 Other hyperlipidemia: Secondary | ICD-10-CM | POA: Diagnosis not present

## 2023-05-10 DIAGNOSIS — E559 Vitamin D deficiency, unspecified: Secondary | ICD-10-CM | POA: Diagnosis not present

## 2023-05-10 DIAGNOSIS — I10 Essential (primary) hypertension: Secondary | ICD-10-CM | POA: Diagnosis not present

## 2023-05-14 DIAGNOSIS — M810 Age-related osteoporosis without current pathological fracture: Secondary | ICD-10-CM | POA: Diagnosis not present

## 2023-05-14 DIAGNOSIS — E7849 Other hyperlipidemia: Secondary | ICD-10-CM | POA: Diagnosis not present

## 2023-05-14 DIAGNOSIS — D692 Other nonthrombocytopenic purpura: Secondary | ICD-10-CM | POA: Diagnosis not present

## 2023-05-14 DIAGNOSIS — Z23 Encounter for immunization: Secondary | ICD-10-CM | POA: Diagnosis not present

## 2023-05-14 DIAGNOSIS — F331 Major depressive disorder, recurrent, moderate: Secondary | ICD-10-CM | POA: Diagnosis not present

## 2023-05-14 DIAGNOSIS — E039 Hypothyroidism, unspecified: Secondary | ICD-10-CM | POA: Diagnosis not present

## 2023-05-14 DIAGNOSIS — G301 Alzheimer's disease with late onset: Secondary | ICD-10-CM | POA: Diagnosis not present

## 2023-05-14 DIAGNOSIS — K2100 Gastro-esophageal reflux disease with esophagitis, without bleeding: Secondary | ICD-10-CM | POA: Diagnosis not present

## 2023-05-14 DIAGNOSIS — I10 Essential (primary) hypertension: Secondary | ICD-10-CM | POA: Diagnosis not present

## 2023-05-14 DIAGNOSIS — M170 Bilateral primary osteoarthritis of knee: Secondary | ICD-10-CM | POA: Diagnosis not present

## 2023-05-14 DIAGNOSIS — K589 Irritable bowel syndrome without diarrhea: Secondary | ICD-10-CM | POA: Diagnosis not present

## 2023-05-14 DIAGNOSIS — K432 Incisional hernia without obstruction or gangrene: Secondary | ICD-10-CM | POA: Diagnosis not present

## 2023-05-22 DIAGNOSIS — R300 Dysuria: Secondary | ICD-10-CM | POA: Diagnosis not present

## 2023-06-21 DIAGNOSIS — H353131 Nonexudative age-related macular degeneration, bilateral, early dry stage: Secondary | ICD-10-CM | POA: Diagnosis not present

## 2023-06-21 DIAGNOSIS — H401123 Primary open-angle glaucoma, left eye, severe stage: Secondary | ICD-10-CM | POA: Diagnosis not present

## 2023-06-21 DIAGNOSIS — H401112 Primary open-angle glaucoma, right eye, moderate stage: Secondary | ICD-10-CM | POA: Diagnosis not present

## 2023-06-21 DIAGNOSIS — H35373 Puckering of macula, bilateral: Secondary | ICD-10-CM | POA: Diagnosis not present

## 2023-08-15 ENCOUNTER — Ambulatory Visit
Admission: RE | Admit: 2023-08-15 | Discharge: 2023-08-15 | Disposition: A | Discharge status: Home / Self Care | Payer: Medicare Other | Source: Ambulatory Visit | Attending: Nurse Practitioner | Admitting: Nurse Practitioner

## 2023-08-15 ENCOUNTER — Other Ambulatory Visit: Payer: Self-pay

## 2023-08-15 ENCOUNTER — Ambulatory Visit: Payer: Medicare Other

## 2023-08-15 VITALS — BP 134/84 | HR 80 | Temp 98.5°F | Resp 20

## 2023-08-15 DIAGNOSIS — R059 Cough, unspecified: Secondary | ICD-10-CM | POA: Diagnosis not present

## 2023-08-15 DIAGNOSIS — K449 Diaphragmatic hernia without obstruction or gangrene: Secondary | ICD-10-CM | POA: Diagnosis not present

## 2023-08-15 DIAGNOSIS — B349 Viral infection, unspecified: Secondary | ICD-10-CM

## 2023-08-15 LAB — POC COVID19/FLU A&B COMBO
Covid Antigen, POC: NEGATIVE
Influenza A Antigen, POC: NEGATIVE
Influenza B Antigen, POC: NEGATIVE

## 2023-08-15 MED ORDER — BENZONATATE 100 MG PO CAPS: 100.0000 mg | ORAL_CAPSULE | Freq: Three times a day (TID) | ORAL | 0 refills | Status: AC | PRN

## 2023-08-15 NOTE — ED Provider Notes (Signed)
[facility]    CSN: [id number] Arrival date & time: 08/15/23  1256      History   Chief Complaint Chief Complaint  Patient presents with   Influenza    Several family members have pneumonia and she showing signs. - Entered by patient    HPI [name] is a 88 y.o. female.   The history is provided by the patient and a relative.   Patient brought in by her daughter for complaints of cough that is been present for the past 2 days.  Patient states cough appears to have worsened, she also states that patient has also been experiencing intermittent chest congestion.  Daughter denies fever, chills, headache, ear pain, ear drainage, wheezing, difficulty breathing, chest pain, abdominal pain, nausea, vomiting, diarrhea, or rash.  Patient states that she feels like there is a "girdle" wrapped around her chest.  Daughter reports several of the members of the home have been diagnosed with pneumonia.  She reports she has been administering DayQuil for patient's cough.  Past Medical History:  Diagnosis Date   Breast cancer (HCC) 2003   Dementia (HCC)    Diverticulitis    History   Dyslexia    GERD (gastroesophageal reflux disease)    takes Nexium daily   H/O hiatal hernia    Headache(784.0)    all the time   Heart murmur    History of esophageal spasm    History of IBS    no problem in past yr   History of kidney stones    History of shingles    HTN (hypertension)    hx of   Hyperlipidemia    takes Pravastatin daily   Hypokalemia    Hypothyroidism    takes Synthroid daily   Impaired hearing    Incisional hernia    Abdomen   Incisional hernia, without obstruction or gangrene 04/22/2018   Joint pain    Joint swelling    Neck pain    pinched    Osteoarthritis    Osteoporosis 09/08/2011   PONV (postoperative nausea and vomiting)    Syncope, non cardiac    Weakness    numbness left arm    Patient Active Problem List   Diagnosis Date Noted   Incisional  hernia, without obstruction or gangrene 04/22/2018   Incisional hernia 04/22/2018   Rectal bleeding 07/04/2017   H/O colectomy 03/09/2017   Abnormal CT scan, colon 02/09/2017   Abnormal weight loss 12/27/2016   Left sided colitis (HCC) 12/27/2016   IDA (iron deficiency anemia) 10/18/2016   Heme positive stool 10/18/2016   S/P parathyroidectomy 12/03/2013   Neck mass 10/30/2013   Diverticulitis 10/26/2013   Generalized weakness 10/26/2013   Diverticulitis with obstruction (HCC) 10/26/2013   Infiltrating lobular carcinoma, left breast 04/04/2013   Syncope and collapse 02/21/2012   Chest pain 02/21/2012   Dehydration 02/21/2012   Acute renal failure (HCC) 02/21/2012   HTN (hypertension) 02/21/2012   Hypothyroid 02/21/2012   GERD (gastroesophageal reflux disease) 02/21/2012   Osteoporosis 09/08/2011   FULL INCONTINENCE OF FECES 11/07/2010   DIARRHEA 11/07/2010    Past Surgical History:  Procedure Laterality Date   APPENDECTOMY     Arm Fx     left   BREAST LUMPECTOMY     Left   CARDIAC CATHETERIZATION     x 3-last one in the 90's per pt   CATARACT EXTRACTION     CATARACT EXTRACTION W/PHACO  10/02/2011   Procedure: CATARACT EXTRACTION PHACO  AND INTRAOCULAR LENS PLACEMENT (IOC);  Surgeon: Gemma Payor, MD;  Location: [facility];  Service: Ophthalmology;  Laterality: Right;  CDE:17.01   CHOLECYSTECTOMY  1997   Status post -stones   COLONOSCOPY  2012   Dr. Jena Gauss: pancolonic diverticulosis, tubular adenoma removed from cecum. next TCS 2019   COLONOSCOPY N/A 11/17/2016   Procedure: COLONOSCOPY;  Surgeon: Corbin Ade, MD;  Location: [facility];  Service: Endoscopy;  Laterality: N/A;  1200 - moved to 3/16 @ 12:00   COLONOSCOPY WITH PROPOFOL N/A 10/09/2019   Procedure: COLONOSCOPY WITH PROPOFOL;  Surgeon: Corbin Ade, MD;  Location: [facility];  Service: Endoscopy;  Laterality: N/A;  1:15pm   EGD with dilitation     2004/2005 schatki's ring s/p dilation, wrap no longer intact on  2005 egd.   ESOPHAGOGASTRODUODENOSCOPY N/A 11/17/2016   Procedure: ESOPHAGOGASTRODUODENOSCOPY (EGD);  Surgeon: Corbin Ade, MD;  Location: [facility];  Service: Endoscopy;  Laterality: N/A;   ESOPHAGOGASTRODUODENOSCOPY (EGD) WITH PROPOFOL N/A 10/09/2019   Procedure: ESOPHAGOGASTRODUODENOSCOPY (EGD) WITH PROPOFOL;  Surgeon: Corbin Ade, MD;  Location: [facility];  Service: Endoscopy;  Laterality: N/A;   HEMOSTASIS CLIP PLACEMENT  10/09/2019   Procedure: HEMOSTASIS CLIP PLACEMENT;  Surgeon: Corbin Ade, MD;  Location: [facility];  Service: Endoscopy;;   INCISIONAL HERNIA REPAIR N/A 04/22/2018   Procedure: OPEN REPAIR INCISIONAL HERNIA ERAS PATHWAY;  Surgeon: Claud Kelp, MD;  Location: [facility] OR;  Service: General;  Laterality: N/A;   INSERTION OF MESH N/A 04/22/2018   Procedure: INSERTION OF MESH;  Surgeon: Claud Kelp, MD;  Location: [facility] OR;  Service: General;  Laterality: N/A;   kidney stent  06/04/11   removed after about 3 weeks.   KIDNEY STONE SURGERY  1992   KNEE SURGERY     bilateral   LAPAROSCOPIC NISSEN FUNDOPLICATION  1997   LAPAROSCOPIC PARTIAL COLECTOMY N/A 03/09/2017   Procedure: LAPAROSCOPY, TAKEDOWN SPLENIC FLEXURE, LEFT COLECTOMY, PROCTOSCOPY;  Surgeon: Claud Kelp, MD;  Location: [facility];  Service: General;  Laterality: N/A;   LITHOTRIPSY  11/12   MALONEY DILATION N/A 10/09/2019   Procedure: Elease Hashimoto DILATION;  Surgeon: Corbin Ade, MD;  Location: [facility];  Service: Endoscopy;  Laterality: N/A;   MANDIBLE RECONSTRUCTION     NECK SURGERY  2000   [facility]   PARATHYROIDECTOMY  12/03/2013   DR TEOH   POLYPECTOMY  10/09/2019   Procedure: POLYPECTOMY;  Surgeon: Corbin Ade, MD;  Location: [facility];  Service: Endoscopy;;  colon   PROCTOSCOPY  03/09/2017   Procedure: PROCTOSCOPY;  Surgeon: Claud Kelp, MD;  Location: [facility];  Service: General;;   SHOULDER SURGERY Left    THYROIDECTOMY     Hypothyroidism status post   THYROIDECTOMY N/A 12/03/2013    Procedure: PARATHYROIDECTOMY;  Surgeon: Darletta Moll, MD;  Location: [facility] OR;  Service: ENT;  Laterality: N/A;   TONSILLECTOMY     TUBAL LIGATION     WRIST SURGERY Left 2009    OB History     Gravida  6   Para  4   Term  4   Preterm      AB  2   Living  3      SAB  2   IAB      Ectopic      Multiple      Live Births               Home Medications  Prior to Admission medications   Medication Sig Start Date End Date Taking? Authorizing Provider  benzonatate (TESSALON PERLES) 100 MG capsule Take 1 capsule (100 mg total) by mouth 3 (three) times daily as needed for cough. 08/15/23  Yes Leath-Warren, Sadie Haber, NP  acetaminophen (TYLENOL) 650 MG CR tablet Take 650-1,300 mg by mouth every 8 (eight) hours as needed for pain.    [provider]  Calcium Carb-Cholecalciferol (CALCIUM 600 + D PO) Take 2 tablets by mouth daily.    [provider]  cephALEXin (KEFLEX) 500 MG capsule Take 1 capsule (500 mg total) by mouth 4 (four) times daily. 10/09/22   Ernie Avena, MD  famotidine (PEPCID) 20 MG tablet Take 20 mg by mouth daily. 12/02/19   [provider]  HYDROcodone-acetaminophen (NORCO/VICODIN) 5-325 MG tablet Take 1-2 tablets by mouth every 6 (six) hours as needed. 10/09/22   Ernie Avena, MD  latanoprost (XALATAN) 0.005 % ophthalmic solution Place 1 drop into both eyes at bedtime. 12/17/17   [provider]  levothyroxine (SYNTHROID, LEVOTHROID) 88 MCG tablet Take 88 mcg by mouth daily before breakfast.    [provider]  lisinopril (ZESTRIL) 10 MG tablet Take 10 mg by mouth daily.  12/05/18 12/26/19  [provider]  Menthol, Topical Analgesic, (GNP COLD/HOT PAIN RELIEF PATCH EX) Apply 1 patch topically daily as needed (pain).    [provider]  Multiple Vitamins-Minerals (CENTRUM SILVER ADULT 50+ PO) Take 2 capsules by mouth daily.     [provider]  Multiple Vitamins-Minerals (PRESERVISION AREDS  PO) Take 1 tablet by mouth daily.     [provider]  pantoprazole (PROTONIX) 40 MG tablet Take 40 mg by mouth daily.     [provider]  pravastatin (PRAVACHOL) 20 MG tablet Take 20 mg by mouth every morning.     [provider]  risperiDONE (RISPERDAL) 0.5 MG tablet Take 0.5 mg by mouth at bedtime. 08/04/19   [provider]  sertraline (ZOLOFT) 25 MG tablet Take 25 mg by mouth daily. 12/17/19   [provider]  traMADol (ULTRAM) 50 MG tablet Take 50 mg by mouth daily as needed for severe pain.     [provider]    Family History Family History  Problem Relation Age of Onset   Macular degeneration Sister    Lung cancer Sister    Pancreatic cancer Mother    Heart attack Father    Anesthesia problems Neg Hx    Hypotension Neg Hx    Pseudochol deficiency Neg Hx    Malignant hyperthermia Neg Hx    Colon cancer Neg Hx     Social History Social History   Tobacco Use   Smoking status: Never   Smokeless tobacco: Never  Vaping Use   Vaping status: Never Used  Substance Use Topics   Alcohol use: No   Drug use: No     Allergies   Cortisone, Metronidazole, Oxycodone, Ciprofloxacin, and Latex   Review of Systems Review of Systems Per HPI  Physical Exam Triage Vital Signs ED Triage Vitals [08/15/23 1336]  Encounter Vitals Group     BP 134/84     Systolic BP Percentile      Diastolic BP Percentile      Pulse Rate 80     Resp 20     Temp 98.5 F (36.9 C)     Temp Source Oral     SpO2 92 %     Weight  Height      Head Circumference      Peak Flow      Pain Score 5     Pain Loc      Pain Education      Exclude from Growth Chart    No data found.  Updated Vital Signs BP 134/84 (BP Location: Right Arm)   Pulse 80   Temp 98.5 F (36.9 C) (Oral)   Resp 20   SpO2 92%   Visual Acuity Right Eye Distance:   Left Eye Distance:   Bilateral Distance:    Right Eye Near:   Left Eye Near:    Bilateral  Near:     Physical Exam Vitals and nursing note reviewed.  Constitutional:      General: She is not in acute distress.    Appearance: Normal appearance.  HENT:     Head: Normocephalic.     Right Ear: Tympanic membrane, ear canal and external ear normal.     Left Ear: Tympanic membrane, ear canal and external ear normal.     Nose: Nose normal.     Right Turbinates: Enlarged and swollen.     Left Turbinates: Enlarged and swollen.     Right Sinus: No maxillary sinus tenderness or frontal sinus tenderness.     Left Sinus: No maxillary sinus tenderness or frontal sinus tenderness.     Mouth/Throat:     Lips: Pink.     Mouth: Mucous membranes are moist.     Pharynx: Uvula midline. Postnasal drip present. No pharyngeal swelling or posterior oropharyngeal erythema.  Eyes:     Extraocular Movements: Extraocular movements intact.     Conjunctiva/sclera: Conjunctivae normal.     Pupils: Pupils are equal, round, and reactive to light.  Cardiovascular:     Rate and Rhythm: Normal rate and regular rhythm.     Pulses: Normal pulses.     Heart sounds: Normal heart sounds.  Pulmonary:     Effort: Pulmonary effort is normal. No respiratory distress.     Breath sounds: Normal breath sounds. No stridor. No wheezing, rhonchi or rales.  Abdominal:     General: Bowel sounds are normal.     Palpations: Abdomen is soft.     Tenderness: There is no abdominal tenderness.  Musculoskeletal:     Cervical back: Normal range of motion.  Lymphadenopathy:     Cervical: No cervical adenopathy.  Skin:    General: Skin is warm and dry.  Neurological:     General: No focal deficit present.     Mental Status: She is alert and oriented to person, place, and time.  Psychiatric:        Mood and Affect: Mood normal.        Behavior: Behavior normal.      UC Treatments / Results  Labs (all labs ordered are listed, but only abnormal results are displayed) Labs Reviewed  POC COVID19/FLU A&B COMBO     EKG   Radiology DG Chest 2 View  Result Date: 08/15/2023 CLINICAL DATA:  88 year old female with cough EXAM: CHEST - 2 VIEW COMPARISON:  07/20/2014 FINDINGS: Cardiomediastinal silhouette unchanged in size and contour. Double density over the lower mediastinum is unchanged with air-fluid level. No pneumothorax or pleural effusion.  No confluent airspace disease. Degenerative changes spine.  No displaced fracture Surgical changes of the left chest wall IMPRESSION: Negative for acute cardiopulmonary disease. Hiatal hernia Electronically Signed   By: Gilmer Mor D.O.   On: 08/15/2023 14:41  Procedures Procedures (including critical care time)  Medications Ordered in UC Medications - No data to display  Initial Impression / Assessment and Plan / UC Course  I have reviewed the triage vital signs and the nursing notes.  Pertinent labs & imaging results that were available during my care of the patient were reviewed by me and considered in my medical decision making (see chart for details).  Chest x-ray is pending.  Suspect viral etiology that may be causing the patient's cough.  Will treat with Tessalon 100 mg.  Supportive care recommendations were provided and discussed with the patient and her daughter to include increasing fluids, allowing for plenty of rest, over-the-counter cough drops, and use of a humidifier in her bedroom during sleep.  Patient's daughter was given strict follow-up precautions.  Daughter was in agreement with this plan of care and verbalizes understanding.  All questions were answered.  Patient stable for discharge.  Final Clinical Impressions(s) / UC Diagnoses   Final diagnoses:  Cough, unspecified type  Viral illness     Discharge Instructions      The chest x-ray was negative for pneumonia.  COVID/flu test was also negative. Administer medication as prescribed. Increase fluids and allow for plenty of rest. May take over-the-counter Tylenol as needed  for pain, fever, or general discomfort. For the cough, recommend using a humidifier in her bedroom at nighttime during sleep and having her sleep slightly elevated while symptoms persist. If symptoms have not improved over the next 10 to 14 days, or if symptoms appear to be worsening, please follow-up with her PCP for further evaluation. Follow-up as needed.     ED Prescriptions     Medication Sig Dispense Auth. Provider   benzonatate (TESSALON PERLES) 100 MG capsule Take 1 capsule (100 mg total) by mouth 3 (three) times daily as needed for cough. 30 capsule Leath-Warren, Sadie Haber, NP      PDMP not reviewed this encounter.   Abran Cantor, NP 08/15/23 1456

## 2023-08-15 NOTE — ED Triage Notes (Signed)
Pt family reports pt has had a cough x2 days, sore throat and intermittent chest congestion. Pt family reports several family members in pt residence have recently been diagnosed with pneumonia and is concerned pt has it as well.

## 2023-08-15 NOTE — Discharge Instructions (Addendum)
The chest x-ray was negative for pneumonia.  COVID/flu test was also negative. Administer medication as prescribed. Increase fluids and allow for plenty of rest. May take over-the-counter Tylenol as needed for pain, fever, or general discomfort. For the cough, recommend using a humidifier in her bedroom at nighttime during sleep and having her sleep slightly elevated while symptoms persist. If symptoms have not improved over the next 10 to 14 days, or if symptoms appear to be worsening, please follow-up with her PCP for further evaluation. Follow-up as needed.

## 2023-10-12 DIAGNOSIS — H353131 Nonexudative age-related macular degeneration, bilateral, early dry stage: Secondary | ICD-10-CM | POA: Diagnosis not present

## 2023-10-12 DIAGNOSIS — H401123 Primary open-angle glaucoma, left eye, severe stage: Secondary | ICD-10-CM | POA: Diagnosis not present

## 2023-10-12 DIAGNOSIS — H401112 Primary open-angle glaucoma, right eye, moderate stage: Secondary | ICD-10-CM | POA: Diagnosis not present

## 2023-10-23 DIAGNOSIS — J069 Acute upper respiratory infection, unspecified: Secondary | ICD-10-CM | POA: Diagnosis not present

## 2023-10-23 DIAGNOSIS — Z6825 Body mass index (BMI) 25.0-25.9, adult: Secondary | ICD-10-CM | POA: Diagnosis not present

## 2023-10-27 DIAGNOSIS — Z6825 Body mass index (BMI) 25.0-25.9, adult: Secondary | ICD-10-CM | POA: Diagnosis not present

## 2023-10-27 DIAGNOSIS — J209 Acute bronchitis, unspecified: Secondary | ICD-10-CM | POA: Diagnosis not present

## 2023-10-27 DIAGNOSIS — Z2089 Contact with and (suspected) exposure to other communicable diseases: Secondary | ICD-10-CM | POA: Diagnosis not present

## 2023-10-27 DIAGNOSIS — J019 Acute sinusitis, unspecified: Secondary | ICD-10-CM | POA: Diagnosis not present

## 2023-10-27 DIAGNOSIS — J029 Acute pharyngitis, unspecified: Secondary | ICD-10-CM | POA: Diagnosis not present

## 2023-10-27 DIAGNOSIS — Z20828 Contact with and (suspected) exposure to other viral communicable diseases: Secondary | ICD-10-CM | POA: Diagnosis not present

## 2023-10-27 DIAGNOSIS — R112 Nausea with vomiting, unspecified: Secondary | ICD-10-CM | POA: Diagnosis not present

## 2023-11-21 DIAGNOSIS — Z0001 Encounter for general adult medical examination with abnormal findings: Secondary | ICD-10-CM | POA: Diagnosis not present

## 2023-11-21 DIAGNOSIS — Z1389 Encounter for screening for other disorder: Secondary | ICD-10-CM | POA: Diagnosis not present

## 2023-11-21 DIAGNOSIS — Z6825 Body mass index (BMI) 25.0-25.9, adult: Secondary | ICD-10-CM | POA: Diagnosis not present

## 2023-11-21 DIAGNOSIS — Z9189 Other specified personal risk factors, not elsewhere classified: Secondary | ICD-10-CM | POA: Diagnosis not present

## 2023-11-21 DIAGNOSIS — Z1331 Encounter for screening for depression: Secondary | ICD-10-CM | POA: Diagnosis not present

## 2023-11-22 DIAGNOSIS — Z0001 Encounter for general adult medical examination with abnormal findings: Secondary | ICD-10-CM | POA: Diagnosis not present

## 2023-11-22 DIAGNOSIS — Z6824 Body mass index (BMI) 24.0-24.9, adult: Secondary | ICD-10-CM | POA: Diagnosis not present

## 2023-11-22 DIAGNOSIS — E039 Hypothyroidism, unspecified: Secondary | ICD-10-CM | POA: Diagnosis not present

## 2023-11-22 DIAGNOSIS — E7849 Other hyperlipidemia: Secondary | ICD-10-CM | POA: Diagnosis not present

## 2023-11-22 DIAGNOSIS — E782 Mixed hyperlipidemia: Secondary | ICD-10-CM | POA: Diagnosis not present

## 2023-11-22 DIAGNOSIS — D692 Other nonthrombocytopenic purpura: Secondary | ICD-10-CM | POA: Diagnosis not present

## 2023-11-22 DIAGNOSIS — I10 Essential (primary) hypertension: Secondary | ICD-10-CM | POA: Diagnosis not present

## 2024-02-25 DIAGNOSIS — H353131 Nonexudative age-related macular degeneration, bilateral, early dry stage: Secondary | ICD-10-CM | POA: Diagnosis not present

## 2024-02-25 DIAGNOSIS — H401123 Primary open-angle glaucoma, left eye, severe stage: Secondary | ICD-10-CM | POA: Diagnosis not present

## 2024-02-25 DIAGNOSIS — H35373 Puckering of macula, bilateral: Secondary | ICD-10-CM | POA: Diagnosis not present

## 2024-02-25 DIAGNOSIS — H401112 Primary open-angle glaucoma, right eye, moderate stage: Secondary | ICD-10-CM | POA: Diagnosis not present

## 2024-04-29 ENCOUNTER — Telehealth (INDEPENDENT_AMBULATORY_CARE_PROVIDER_SITE_OTHER): Payer: Self-pay

## 2024-04-29 NOTE — Telephone Encounter (Signed)
 Patient's daughter called regarding hearing aids. Requesting a call back from Dr. Tiney.

## 2024-05-06 ENCOUNTER — Ambulatory Visit (INDEPENDENT_AMBULATORY_CARE_PROVIDER_SITE_OTHER): Payer: Self-pay | Admitting: Audiology

## 2024-05-06 ENCOUNTER — Encounter (INDEPENDENT_AMBULATORY_CARE_PROVIDER_SITE_OTHER): Payer: Self-pay | Admitting: Otolaryngology

## 2024-05-06 ENCOUNTER — Ambulatory Visit (INDEPENDENT_AMBULATORY_CARE_PROVIDER_SITE_OTHER): Admitting: Otolaryngology

## 2024-05-06 VITALS — BP 160/96 | HR 68

## 2024-05-06 DIAGNOSIS — H608X2 Other otitis externa, left ear: Secondary | ICD-10-CM | POA: Diagnosis not present

## 2024-05-06 DIAGNOSIS — E7849 Other hyperlipidemia: Secondary | ICD-10-CM | POA: Diagnosis not present

## 2024-05-06 DIAGNOSIS — H9190 Unspecified hearing loss, unspecified ear: Secondary | ICD-10-CM

## 2024-05-06 DIAGNOSIS — E039 Hypothyroidism, unspecified: Secondary | ICD-10-CM | POA: Diagnosis not present

## 2024-05-06 DIAGNOSIS — D559 Anemia due to enzyme disorder, unspecified: Secondary | ICD-10-CM | POA: Diagnosis not present

## 2024-05-06 DIAGNOSIS — H903 Sensorineural hearing loss, bilateral: Secondary | ICD-10-CM

## 2024-05-06 DIAGNOSIS — E559 Vitamin D deficiency, unspecified: Secondary | ICD-10-CM | POA: Diagnosis not present

## 2024-05-06 DIAGNOSIS — Z0001 Encounter for general adult medical examination with abnormal findings: Secondary | ICD-10-CM | POA: Diagnosis not present

## 2024-05-06 DIAGNOSIS — I10 Essential (primary) hypertension: Secondary | ICD-10-CM | POA: Diagnosis not present

## 2024-05-06 DIAGNOSIS — N183 Chronic kidney disease, stage 3 unspecified: Secondary | ICD-10-CM | POA: Diagnosis not present

## 2024-05-06 MED ORDER — MOMETASONE FUROATE 0.1 % EX CREA: TOPICAL_CREAM | CUTANEOUS | 3 refills | Status: AC

## 2024-05-06 NOTE — Progress Notes (Unsigned)
 CC: Left ear discomfort, hearing loss  HPI:  [name] is a 89 y.o. female who presents today with her daughter.  The patient has a history of bilateral high-frequency sensorineural hearing loss.  She was previously fitted with bilateral hearing aids.  According to the daughter, the patient has been complaining of itchy sensation in her left ear for the past year.  She scratches her left ear canal frequently.  It has resulted in left ear discomfort and a left ear sore.  The patient denies any otorrhea or vertigo.  She denies any recent change in her hearing.  Past Medical History:  Diagnosis Date   Breast cancer (HCC) 2003   Dementia (HCC)    Diverticulitis    History   Dyslexia    GERD (gastroesophageal reflux disease)    takes Nexium daily   H/O hiatal hernia    Headache(784.0)    all the time   Heart murmur    History of esophageal spasm    History of IBS    no problem in past yr   History of kidney stones    History of shingles    HTN (hypertension)    hx of   Hyperlipidemia    takes Pravastatin  daily   Hypokalemia    Hypothyroidism    takes Synthroid  daily   Impaired hearing    Incisional hernia    Abdomen   Incisional hernia, without obstruction or gangrene 04/22/2018   Joint pain    Joint swelling    Neck pain    pinched    Osteoarthritis    Osteoporosis 09/08/2011   PONV (postoperative nausea and vomiting)    Syncope, non cardiac    Weakness    numbness left arm    Past Surgical History:  Procedure Laterality Date   APPENDECTOMY     Arm Fx     left   BREAST LUMPECTOMY     Left   CARDIAC CATHETERIZATION     x 3-last one in the 90's per pt   CATARACT EXTRACTION     CATARACT EXTRACTION W/PHACO  10/02/2011   Procedure: CATARACT EXTRACTION PHACO AND INTRAOCULAR LENS PLACEMENT (IOC);  Surgeon: Cherene Mania, MD;  Location: [facility];  Service: Ophthalmology;  Laterality: Right;  CDE:17.01   CHOLECYSTECTOMY  1997   Status post -stones   COLONOSCOPY  2012   Dr.  Shaaron: pancolonic diverticulosis, tubular adenoma removed from cecum. next TCS 2019   COLONOSCOPY N/A 11/17/2016   Procedure: COLONOSCOPY;  Surgeon: Lamar CHRISTELLA Shaaron, MD;  Location: [facility];  Service: Endoscopy;  Laterality: N/A;  1200 - moved to 3/16 @ 12:00   COLONOSCOPY WITH PROPOFOL  N/A 10/09/2019   Procedure: COLONOSCOPY WITH PROPOFOL ;  Surgeon: Shaaron Lamar CHRISTELLA, MD;  Location: [facility];  Service: Endoscopy;  Laterality: N/A;  1:15pm   EGD with dilitation     2004/2005 schatki's ring s/p dilation, wrap no longer intact on 2005 egd.   ESOPHAGOGASTRODUODENOSCOPY N/A 11/17/2016   Procedure: ESOPHAGOGASTRODUODENOSCOPY (EGD);  Surgeon: Lamar CHRISTELLA Shaaron, MD;  Location: [facility];  Service: Endoscopy;  Laterality: N/A;   ESOPHAGOGASTRODUODENOSCOPY (EGD) WITH PROPOFOL  N/A 10/09/2019   Procedure: ESOPHAGOGASTRODUODENOSCOPY (EGD) WITH PROPOFOL ;  Surgeon: Shaaron Lamar CHRISTELLA, MD;  Location: [facility];  Service: Endoscopy;  Laterality: N/A;   HEMOSTASIS CLIP PLACEMENT  10/09/2019   Procedure: HEMOSTASIS CLIP PLACEMENT;  Surgeon: Shaaron Lamar CHRISTELLA, MD;  Location: [facility];  Service: Endoscopy;;   INCISIONAL HERNIA REPAIR N/A 04/22/2018  Procedure: OPEN REPAIR INCISIONAL HERNIA ERAS PATHWAY;  Surgeon: Gail Favorite, MD;  Location[facility];  Service: General;  Laterality: N/A;   INSERTION OF MESH N/A 04/22/2018   Procedure: INSERTION OF MESH;  Surgeon: Gail Favorite, MD;  Location: [facility];  Service: General;  Laterality: N/A;   kidney stent  06/04/11   removed after about 3 weeks.   KIDNEY STONE SURGERY  1992   KNEE SURGERY     bilateral   LAPAROSCOPIC NISSEN FUNDOPLICATION  1997   LAPAROSCOPIC PARTIAL COLECTOMY N/A 03/09/2017   Procedure: LAPAROSCOPY, TAKEDOWN SPLENIC FLEXURE, LEFT COLECTOMY, PROCTOSCOPY;  Surgeon: Gail Favorite, MD;  Location: [facility];  Service: General;  Laterality: N/A;   LITHOTRIPSY  11/12   MALONEY DILATION N/A 10/09/2019   Procedure: AGAPITO DILATION;  Surgeon: Shaaron Lamar HERO, MD;  Location: [facility];  Service: Endoscopy;  Laterality: N/A;   MANDIBLE RECONSTRUCTION     NECK SURGERY  2000   [facility]   PARATHYROIDECTOMY  12/03/2013   DR Makahla Kiser   POLYPECTOMY  10/09/2019   Procedure: POLYPECTOMY;  Surgeon: Shaaron Lamar HERO, MD;  Location: [facility];  Service: Endoscopy;;  colon   PROCTOSCOPY  03/09/2017   Procedure: PROCTOSCOPY;  Surgeon: Gail Favorite, MD;  Location: [facility];  Service: General;;   SHOULDER SURGERY Left    THYROIDECTOMY     Hypothyroidism status post   THYROIDECTOMY N/A 12/03/2013   Procedure: PARATHYROIDECTOMY;  Surgeon: Ana LELON Moccasin, MD;  Location: [facility] OR;  Service: ENT;  Laterality: N/A;   TONSILLECTOMY     TUBAL LIGATION     WRIST SURGERY Left 2009    Family History  Problem Relation Age of Onset   Macular degeneration Sister    Lung cancer Sister    Pancreatic cancer Mother    Heart attack Father    Anesthesia problems Neg Hx    Hypotension Neg Hx    Pseudochol deficiency Neg Hx    Malignant hyperthermia Neg Hx    Colon cancer Neg Hx     Social History:  reports that she has never smoked. She has never used smokeless tobacco. She reports that she does not drink alcohol  and does not use drugs.  Allergies:  Allergies  Allergen Reactions   Cortisone Other (See Comments)    Hallucinations   Metronidazole Other (See Comments)    No taste in mouth and lost 32 lbs   Oxycodone Other (See Comments)     delusions   Ciprofloxacin  Rash   Latex Itching and Rash    Prior to Admission medications   Medication Sig Start Date End Date Taking? Authorizing Provider  mometasone  (ELOCON ) 0.1 % cream Apply topically as needed for itch 05/06/24  Yes Moccasin Clunes, MD  acetaminophen  (TYLENOL ) 650 MG CR tablet Take 650-1,300 mg by mouth every 8 (eight) hours as needed for pain.    [provider]  benzonatate  (TESSALON  PERLES) 100 MG capsule Take 1 capsule (100 mg total) by mouth 3 (three) times daily as needed for cough. 08/15/23   Leath-Warren,  Etta PARAS, NP  Calcium  Carb-Cholecalciferol (CALCIUM  600 + D PO) Take 2 tablets by mouth daily.    [provider]  cephALEXin  (KEFLEX ) 500 MG capsule Take 1 capsule (500 mg total) by mouth 4 (four) times daily. 10/09/22   Jerrol Agent, MD  famotidine (PEPCID) 20 MG tablet Take 20 mg by mouth daily. 12/02/19   [provider]  HYDROcodone -acetaminophen  (NORCO/VICODIN) 5-325 MG tablet Take 1-2 tablets by  mouth every 6 (six) hours as needed. 10/09/22   Jerrol Agent, MD  latanoprost  (XALATAN ) 0.005 % ophthalmic solution Place 1 drop into both eyes at bedtime. 12/17/17   [provider]  levothyroxine  (SYNTHROID , LEVOTHROID) 88 MCG tablet Take 88 mcg by mouth daily before breakfast.    [provider]  lisinopril (ZESTRIL) 10 MG tablet Take 10 mg by mouth daily.  12/05/18 12/26/19  [provider]  Menthol, Topical Analgesic, (GNP COLD/HOT PAIN RELIEF PATCH EX) Apply 1 patch topically daily as needed (pain).    [provider]  Multiple Vitamins-Minerals (CENTRUM SILVER  ADULT 50+ PO) Take 2 capsules by mouth daily.     [provider]  Multiple Vitamins-Minerals (PRESERVISION AREDS PO) Take 1 tablet by mouth daily.     [provider]  pantoprazole  (PROTONIX ) 40 MG tablet Take 40 mg by mouth daily.     [provider]  pravastatin  (PRAVACHOL ) 20 MG tablet Take 20 mg by mouth every morning.     [provider]  risperiDONE (RISPERDAL) 0.5 MG tablet Take 0.5 mg by mouth at bedtime. 08/04/19   [provider]  sertraline (ZOLOFT) 25 MG tablet Take 25 mg by mouth daily. 12/17/19   [provider]  traMADol  (ULTRAM ) 50 MG tablet Take 50 mg by mouth daily as needed for severe pain.     [provider]    Blood pressure (!) 160/96, pulse 68, SpO2 95%. Exam: General: Communicates without difficulty, well nourished, no acute distress. Head: Normocephalic, no evidence injury, no tenderness, facial  buttresses intact without stepoff. Face/sinus: No tenderness to palpation and percussion. Facial movement is normal and symmetric. Eyes: PERRL, EOMI. No scleral icterus, conjunctivae clear. Neuro: CN II exam reveals vision grossly intact.  No nystagmus at any point of gaze. Ears: Auricles well formed without lesions.  Eczematous changes are noted within the left ear canal.  Both tympanic membranes and middle ear spaces are noted to be normal.  Nose: External evaluation reveals normal support and skin without lesions.  Dorsum is intact.  Anterior rhinoscopy reveals congested mucosa over anterior aspect of inferior turbinates and intact septum.  No purulence noted. Oral:  Oral cavity and oropharynx are intact, symmetric, without erythema or edema.  Mucosa is moist without lesions. Neck: Full range of motion without pain.  There is no significant lymphadenopathy.  No masses palpable.  Thyroid  bed within normal limits to palpation.  Parotid glands and submandibular glands equal bilaterally without mass.  Trachea is midline. Neuro:  CN 2-12 grossly intact.   Assessment: 1.  Chronic eczematous left otitis externa. 2.  Bilateral high-frequency sensorineural hearing loss, likely secondary to presbycusis.  Plan: 1.  The physical exam findings are reviewed with the patient and her daughter. 2.  Elocon  cream to treat the chronic eczematous otitis externa. 3.  Continue the use of her hearing aids. 4.  The patient is encouraged to call with any questions or concerns.  Tinya Cadogan W Muath Hallam 05/06/2024, 5:05 PM

## 2024-05-06 NOTE — Progress Notes (Signed)
  [address], [address] [place], [place] [address] [phone]  Hearing Aid Check     [name] comes for a scheduled appointment for a hearing aid check.   Accompanied ab:[id number]   Right Left  Hearing aid manufacturer Phonak Audeo P 50R [id number] Phonak Oda [name] [id number]  Hearing aid style Receiver in the canal Receiver in the canal  Hearing aid battery rechargeable rechargeable  Receiver 30M 30M  Dome/ custom earpiece    Retention wire yes yes  Warranty expiration date 09-23-22 09-23-22  Loss and Damage expired expired  Additional accessories Expiration date    Initial fitting date 07-02-2020 07-02-2020  Device [facility] fit at: [facility]    Chief complaint: Patient reports cannot hear with the aids.  Actions taken: Both aids needed new domes and wax filters. Also, cleaned their microphones. The right aid was not amplifying. Patient agreed to pay for the service fee/new receiver wire for the right aid.  Services fee: $110 was paid at checkout.  Otoscopy was abnormal for the left ear. She reported soreness and there was some blood in the outer portion of the external ear canal. Patient was oriented about not wearing the left aid up until she had medical clearance.  Recommend: Return for a hearing aid check , as needed. Return for a hearing evaluation and to see an ENT, if concerns with hearing changes arise.    [name] [name], AUD

## 2024-05-07 DIAGNOSIS — I10 Essential (primary) hypertension: Secondary | ICD-10-CM | POA: Diagnosis not present

## 2024-05-07 DIAGNOSIS — H608X2 Other otitis externa, left ear: Secondary | ICD-10-CM | POA: Insufficient documentation

## 2024-05-07 DIAGNOSIS — H903 Sensorineural hearing loss, bilateral: Secondary | ICD-10-CM | POA: Insufficient documentation

## 2024-05-12 DIAGNOSIS — D692 Other nonthrombocytopenic purpura: Secondary | ICD-10-CM | POA: Diagnosis not present

## 2024-05-12 DIAGNOSIS — Z6823 Body mass index (BMI) 23.0-23.9, adult: Secondary | ICD-10-CM | POA: Diagnosis not present

## 2024-05-12 DIAGNOSIS — E7849 Other hyperlipidemia: Secondary | ICD-10-CM | POA: Diagnosis not present

## 2024-05-12 DIAGNOSIS — I10 Essential (primary) hypertension: Secondary | ICD-10-CM | POA: Diagnosis not present

## 2024-05-12 DIAGNOSIS — E039 Hypothyroidism, unspecified: Secondary | ICD-10-CM | POA: Diagnosis not present

## 2024-05-12 DIAGNOSIS — Z23 Encounter for immunization: Secondary | ICD-10-CM | POA: Diagnosis not present

## 2024-05-12 DIAGNOSIS — E782 Mixed hyperlipidemia: Secondary | ICD-10-CM | POA: Diagnosis not present
